# Patient Record
Sex: Male | Born: 1975 | Race: White | Hispanic: No | Marital: Married | Smoking: Former smoker
Health system: Southern US, Community
[De-identification: ages and names within clinical notes are randomized; demographics above are authoritative.]

## PROBLEM LIST (undated history)

## (undated) DIAGNOSIS — M5136 Other intervertebral disc degeneration, lumbar region: Secondary | ICD-10-CM

## (undated) DIAGNOSIS — M51369 Other intervertebral disc degeneration, lumbar region without mention of lumbar back pain or lower extremity pain: Secondary | ICD-10-CM

## (undated) DIAGNOSIS — M810 Age-related osteoporosis without current pathological fracture: Secondary | ICD-10-CM

## (undated) DIAGNOSIS — I10 Essential (primary) hypertension: Secondary | ICD-10-CM

## (undated) DIAGNOSIS — M1990 Unspecified osteoarthritis, unspecified site: Secondary | ICD-10-CM

## (undated) DIAGNOSIS — G894 Chronic pain syndrome: Secondary | ICD-10-CM

## (undated) DIAGNOSIS — G510 Bell's palsy: Secondary | ICD-10-CM

## (undated) HISTORY — DX: Essential (primary) hypertension: I10

## (undated) HISTORY — DX: Unspecified osteoarthritis, unspecified site: M19.90

## (undated) HISTORY — PX: SPINE SURGERY: SHX786

## (undated) HISTORY — DX: Age-related osteoporosis without current pathological fracture: M81.0

## (undated) HISTORY — DX: Chronic pain syndrome: G89.4

## (undated) HISTORY — PX: CHOLECYSTECTOMY: SHX55

## (undated) HISTORY — DX: Other intervertebral disc degeneration, lumbar region: M51.36

## (undated) HISTORY — DX: Other intervertebral disc degeneration, lumbar region without mention of lumbar back pain or lower extremity pain: M51.369

## (undated) HISTORY — DX: Morbid (severe) obesity due to excess calories: E66.01

## (undated) HISTORY — DX: Bell's palsy: G51.0

---

## 2004-01-24 ENCOUNTER — Emergency Department (HOSPITAL_COMMUNITY): Admission: EM | Admit: 2004-01-24 | Discharge: 2004-01-24 | Payer: Self-pay | Admitting: Emergency Medicine

## 2005-07-10 ENCOUNTER — Ambulatory Visit: Payer: Self-pay

## 2005-11-11 ENCOUNTER — Ambulatory Visit: Payer: Self-pay

## 2006-03-17 ENCOUNTER — Emergency Department: Payer: Self-pay | Admitting: General Practice

## 2007-02-25 ENCOUNTER — Emergency Department: Payer: Self-pay | Admitting: Emergency Medicine

## 2007-02-25 ENCOUNTER — Other Ambulatory Visit: Payer: Self-pay

## 2007-05-17 ENCOUNTER — Inpatient Hospital Stay: Payer: Self-pay | Admitting: General Surgery

## 2007-05-17 ENCOUNTER — Other Ambulatory Visit: Payer: Self-pay

## 2007-09-19 ENCOUNTER — Emergency Department: Payer: Self-pay | Admitting: Emergency Medicine

## 2008-04-30 ENCOUNTER — Emergency Department: Payer: Self-pay | Admitting: Emergency Medicine

## 2008-08-12 ENCOUNTER — Other Ambulatory Visit: Payer: Self-pay

## 2008-08-12 ENCOUNTER — Emergency Department: Payer: Self-pay | Admitting: Emergency Medicine

## 2008-08-17 ENCOUNTER — Other Ambulatory Visit: Payer: Self-pay

## 2008-08-18 ENCOUNTER — Inpatient Hospital Stay: Payer: Self-pay | Admitting: Internal Medicine

## 2008-08-18 ENCOUNTER — Encounter: Payer: Self-pay | Admitting: Gastroenterology

## 2008-08-22 ENCOUNTER — Encounter: Payer: Self-pay | Admitting: Gastroenterology

## 2008-09-03 ENCOUNTER — Encounter: Payer: Self-pay | Admitting: Gastroenterology

## 2008-09-05 ENCOUNTER — Ambulatory Visit: Payer: Self-pay | Admitting: Unknown Physician Specialty

## 2008-09-05 ENCOUNTER — Encounter: Payer: Self-pay | Admitting: Gastroenterology

## 2008-09-07 ENCOUNTER — Encounter: Payer: Self-pay | Admitting: Gastroenterology

## 2008-09-25 ENCOUNTER — Encounter: Payer: Self-pay | Admitting: Gastroenterology

## 2008-09-26 ENCOUNTER — Telehealth (INDEPENDENT_AMBULATORY_CARE_PROVIDER_SITE_OTHER): Payer: Self-pay | Admitting: *Deleted

## 2008-10-16 ENCOUNTER — Telehealth: Payer: Self-pay | Admitting: Gastroenterology

## 2009-02-18 ENCOUNTER — Emergency Department: Payer: Self-pay | Admitting: Emergency Medicine

## 2009-05-14 ENCOUNTER — Emergency Department: Payer: Self-pay | Admitting: Emergency Medicine

## 2009-07-13 ENCOUNTER — Emergency Department: Payer: Self-pay | Admitting: Emergency Medicine

## 2009-10-16 ENCOUNTER — Emergency Department: Payer: Self-pay | Admitting: Emergency Medicine

## 2010-04-19 ENCOUNTER — Emergency Department: Payer: Self-pay | Admitting: Emergency Medicine

## 2010-07-22 ENCOUNTER — Ambulatory Visit: Payer: Self-pay

## 2010-12-14 HISTORY — PX: BACK SURGERY: SHX140

## 2011-01-26 ENCOUNTER — Emergency Department: Payer: Self-pay | Admitting: Emergency Medicine

## 2011-03-05 ENCOUNTER — Ambulatory Visit: Payer: Self-pay | Admitting: Specialist

## 2011-03-11 ENCOUNTER — Emergency Department: Payer: Self-pay | Admitting: Emergency Medicine

## 2011-04-08 ENCOUNTER — Encounter (HOSPITAL_COMMUNITY)
Admission: RE | Admit: 2011-04-08 | Discharge: 2011-04-08 | Disposition: A | Discharge status: Home / Self Care | Payer: 59 | Source: Ambulatory Visit | Attending: Neurosurgery | Admitting: Neurosurgery

## 2011-04-08 LAB — CBC
HCT: 39 % (ref 39.0–52.0)
MCH: 31.3 pg (ref 26.0–34.0)
MCV: 85.9 fL (ref 78.0–100.0)
RBC: 4.54 MIL/uL (ref 4.22–5.81)
WBC: 4.7 10*3/uL (ref 4.0–10.5)

## 2011-04-08 LAB — BASIC METABOLIC PANEL
Chloride: 104 mEq/L (ref 96–112)
GFR calc Af Amer: >60 mL/min (ref >60)
Potassium: 4.3 mEq/L (ref 3.5–5.1)

## 2011-04-08 LAB — TYPE AND SCREEN

## 2011-04-08 LAB — DIFFERENTIAL
Eosinophils Relative: 5 % (ref 0–5)
Lymphocytes Relative: 39 % (ref 12–46)
Lymphs Abs: 1.9 10*3/uL (ref 0.7–4.0)
Monocytes Relative: 9 % (ref 3–12)
Neutrophils Relative %: 46 % (ref 43–77)

## 2011-04-08 LAB — SURGICAL PCR SCREEN
MRSA, PCR: NEGATIVE
Staphylococcus aureus: POSITIVE — AB

## 2011-04-08 LAB — ABO/RH: ABO/RH(D): B POS

## 2011-04-13 ENCOUNTER — Inpatient Hospital Stay (HOSPITAL_COMMUNITY): Payer: 59

## 2011-04-13 ENCOUNTER — Ambulatory Visit (HOSPITAL_COMMUNITY)
Admission: RE | Admit: 2011-04-13 | Discharge: 2011-04-14 | Disposition: A | Discharge status: Home / Self Care | Payer: 59 | Source: Ambulatory Visit | Attending: Neurosurgery | Admitting: Neurosurgery

## 2011-04-13 DIAGNOSIS — Z01812 Encounter for preprocedural laboratory examination: Secondary | ICD-10-CM | POA: Insufficient documentation

## 2011-04-13 DIAGNOSIS — M5126 Other intervertebral disc displacement, lumbar region: Secondary | ICD-10-CM | POA: Insufficient documentation

## 2011-04-30 NOTE — Op Note (Signed)
NAMECHASETON, Larry Goodwin             ACCOUNT NO.:  [id number]  MEDICAL RECORD NO.:  [id number]           PATIENT TYPE:  I  LOCATION:  3523                         FACILITY:  [facility]  PHYSICIAN:  Mehtab Dolberry A. Bella Brummet, M.D.    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  PROCEDURE NAME:  Right L4-5 laminotomy and microdiskectomy.  SURGEON:  Kathaleen Maser. Ambrosia Wisnewski, MD.  ASSISTANT:  Donalee Citrin, MD.  ANESTHESIA:  General endotracheal.  INDICATION:  Ms. Larry Goodwin is a 35 year old male with history of back and right lower extremity pain, paresthesias, and weakness of the right- sided L5 radiculopathy, failing conservative management.  Workup demonstrates evidence of broad-based rightward L4-5 disk herniation with compression of thecal sac and right-sided L5 nerve root.  The patient has failed conservative management, who presents now for microdiskectomy in hopes of improving his symptoms.  OPERATIVE NOTE:  The patient was taken to the operating room table and placed in the supine position.  Adequate level of anesthesia was achieved.  The patient was prone onto Wilson frame, appropriately padded the patient's lumbar region and prepped and draped sterilely.  A #10 blade was used to make a curvilinear skin incision overlying the L4-L5 interspace.  This was carried down sharply in the midline.  A subperiosteal dissection was then performed exposing the lamina and facet joints of L4 and L5 on the right side.  Deep self-retaining retractor was placed.  Intraoperative x-ray was taken and the level was confirmed.  The laminotomy was then performed using high-speed drill and Kerrison rongeurs to remove the inferior aspect of lamina of L4, medial aspect of the L4-5  facet joint, superior rim of the L5-S1.  Ligamentum flavum was then elevated and resected in the piecemeal fashion using Kerrison rongeurs.  The underlying thecal sac and exiting S1 nerve root were identified.  Microscope was then brought into the field using microdissection over the right side L5 nerve root underlying disk herniation.  Epidural venous plexus coagulated and cut.  Thecal sac and L5 nerve root was gently mobilized towards the midline. They were retracted using a D'Errico nerve root retractor.  Disk herniation was readily apparent.  This was then incised with 15 blade in a rectangular fashion to widen the disk space.  Clean-out was achieved using pituitary rongeurs and upbiting pituitary rongeurs and Epstein curettes.  All elements of disk herniation were completely resected.  All loose or obvious degenerative disk material was then removed from the interspace. At this point, a very thorough diskectomy had been achieved.  There was no injury to thecal sac or nerve root.  There was no residual compression upon the thecal sac or nerve roots.  The wound was then irrigated with antibiotic solution.  Gelfoam was placed topically for hemostasis and found to be good.  Microscope and retractor system were removed.  Hemostasis was then achieved with electrocautery. The wound was closed in layers with Vicryl suture.  Steri-Strips and sterile dressings were applied.  No complications.  The patient is well, and he returns to recovery room postoperatively.          ______________________________ Kathaleen Maser Anishka Bushard, M.D.     HAP/MEDQ  D:  04/13/2011  T:  04/14/2011  Job:  [id number]  Electronically Signed by Julio Sicks M.D. on 04/30/2011 11:18:27 AM

## 2011-05-03 ENCOUNTER — Inpatient Hospital Stay (HOSPITAL_COMMUNITY)
Admission: EM | Admit: 2011-05-03 | Discharge: 2011-05-06 | DRG: 491 | Disposition: A | Discharge status: Home / Self Care | Payer: 59 | Attending: Neurosurgery | Admitting: Neurosurgery

## 2011-05-03 ENCOUNTER — Emergency Department (HOSPITAL_COMMUNITY): Payer: 59

## 2011-05-03 DIAGNOSIS — M5126 Other intervertebral disc displacement, lumbar region: Principal | ICD-10-CM | POA: Diagnosis present

## 2011-05-03 DIAGNOSIS — E669 Obesity, unspecified: Secondary | ICD-10-CM | POA: Diagnosis present

## 2011-05-03 DIAGNOSIS — Z9889 Other specified postprocedural states: Secondary | ICD-10-CM

## 2011-05-03 LAB — URINALYSIS, ROUTINE W REFLEX MICROSCOPIC
Ketones, ur: NEGATIVE mg/dL
Nitrite: NEGATIVE
Specific Gravity, Urine: 1.024 (ref 1.005–1.030)
pH: 7 (ref 5.0–8.0)

## 2011-05-03 LAB — DIFFERENTIAL
Basophils Absolute: 0 10*3/uL (ref 0.0–0.1)
Eosinophils Absolute: 0.3 10*3/uL (ref 0.0–0.7)
Monocytes Absolute: 0.6 10*3/uL (ref 0.1–1.0)
Neutrophils Relative %: 53 % (ref 43–77)

## 2011-05-03 LAB — CBC
MCH: 31.2 pg (ref 26.0–34.0)
Platelets: 219 10*3/uL (ref 150–400)
RBC: 4.29 MIL/uL (ref 4.22–5.81)
WBC: 5.7 10*3/uL (ref 4.0–10.5)

## 2011-05-03 LAB — BASIC METABOLIC PANEL
BUN: 10 mg/dL (ref 6–23)
GFR calc non Af Amer: >60 mL/min (ref >60)
Potassium: 5.1 mEq/L (ref 3.5–5.1)

## 2011-05-03 MED ORDER — GADOBENATE DIMEGLUMINE 529 MG/ML IV SOLN
20.0000 mL | Freq: Once | INTRAVENOUS | Status: AC | PRN
  Administered 2011-05-03: 20 mL via INTRAVENOUS

## 2011-05-04 ENCOUNTER — Inpatient Hospital Stay (HOSPITAL_COMMUNITY): Payer: 59

## 2011-05-04 LAB — MRSA PCR SCREENING: MRSA by PCR: NEGATIVE

## 2011-05-04 LAB — TYPE AND SCREEN

## 2011-05-06 NOTE — Consult Note (Signed)
  Larry Goodwin, Larry Goodwin             ACCOUNT NO.:  [id number]  MEDICAL RECORD NO.:  [id number]           PATIENT TYPE:  E  LOCATION:  [facility]                         FACILITY:  [facility]  PHYSICIAN:  Cristi Loron, M.D.DATE OF BIRTH:  06/02/1976  DATE OF CONSULTATION:  05/03/2011 DATE OF DISCHARGE:                                CONSULTATION   CHIEF COMPLAINT:  Right leg pain.  HISTORY OF PRESENT ILLNESS:  The patient is a 35 year old white male who underwent a right L4-5 diskectomy, by Dr. Julio Sicks on April 13, 2011. The patient has had some persistent back and right leg pain.  He last saw Dr. Jordan Likes in followup on May 01, 2011.  At that time, Dr. Jordan Likes had discussed physical therapy with him.  The patient has had persistent pain throughout the weekend and got the point where "I could not stand on."  He spoke with Dr. Wynetta Emery, who advised him go to the ER.  The patient was evaluated by Dr. Wayland Salinas and Trixie Dredge, [place].  They called me, I suggested they get a lumbar MRI which is demonstrated likely recurrent ruptured disk.  They asked me to see the patient.  Presently, patient is accompanied by his wife.  He complains of back pain, right leg pain.  He has not noticed any fevers, drainage from the wound.  He does not complain of headache.  The patient has been on Percocet p.r.n. pain with an adequate relief.  The patient's past medical history, past surgical, family history, and social history, etc, can be obtained from Dr. Lindalou Hose previous admission note April 13, 2011.  REVIEW OF SYSTEMS:  Negative except as above.  PHYSICAL EXAMINATION:  GENERAL:  A pleasant obese 35 year old white male who complains of right leg pain.  The patient's lumbar wound is healing well without signs of infection, discharge, etc. NEUROLOGIC:  The patient is alert and oriented. MUSCULOSKELETAL:  Motor strength is 5/5 in left quadriceps, gastrocnemius, dorsiflexes/EHL.  Has some give away weakness,  right quadriceps, dorsiflexion, EHL.  I reviewed patient's lumbar MRI performed without contrast May 03, 2011, at [facility] demonstrates, the patient has a postoperative fluid collection.  He also has findings consistent with a recurrent right-sided ruptured disk.  He has had a prior right L4 laminotomy and diskectomy in L4-5.  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 Jordan Likes, will discuss the situation further with him tomorrow and likely redo a cystectomy.  In the meantime, we will start him on some steroids and a morphine PCA for pain control.     Cristi Loron, M.D.     JDJ/MEDQ  D:  05/03/2011  T:  05/03/2011  Job:  [id number]  Electronically Signed by Tressie Stalker M.D. on 05/06/2011 02:31:32 PM

## 2011-05-14 NOTE — Op Note (Signed)
Larry Goodwin, Larry Goodwin             ACCOUNT NO.:  [id number]  MEDICAL RECORD NO.:  [id number]           PATIENT TYPE:  I  LOCATION:  3009                         FACILITY:  [facility]  PHYSICIAN:  Kathaleen Maser. Linh Johannes, M.D.    DATE OF BIRTH:  01/20/1976  DATE OF PROCEDURE:  05/04/2011 DATE OF DISCHARGE:                              OPERATIVE REPORT   PREOPERATIVE DIAGNOSIS:  Right L4-5 recurrent herniated nucleus pulposus with radiculopathy.  POSTOPERATIVE DIAGNOSIS:  Right L4-5 recurrent herniated nucleus pulposus with radiculopathy.  PROCEDURE:  Right L4-5 re-exploration of laminotomy with right L4-5 redo microdiskectomy.  SURGEON:  Kathaleen Maser. Keeton Kassebaum, MD  ASSISTANT:  None.  ANESTHESIA:  General endotracheal.  INDICATIONS:  [name] is a 35 year old male approximately 3 weeks status post right-sided L4-5 laminotomy and microdiskectomy. Postoperatively, the patient has not thrive.  He has had persistent back and right lower extremity pain.  The patient recently had been readmitted secondary to pain control.  Workup included an MRI scan which demonstrated no evidence of infection or other serious complication. However, the patient did have some evidence of a small recurrent disk herniation with compression upon the thecal sac and right-sided L5 nerve root.  I discussed the situation with the patient.  Given his poor functionality, I thought it was reasonable to re-explore his laminotomy in hopes of improving his situation.  OPERATIVE NOTE:  The patient was taken to the operating room and placed on op table in supine position.  After adequate level of anesthesia was achieved, the patient was placed prone onto Wilson frame and appropriately padded.  The patient's lumbar region was prepped and draped sterilely.  A #10-blade was used to make a curvilinear skin incision overlying the L4-5 interspace.  This was carried down sharply in the midline.  A subperiosteal dissection was performed  exposing the lamina facet joint L4-L5 on the right side.  Deep self-retaining retractors were placed.  Intraoperative x-ray was not needed secondary to being able to visualize the previous disk space and the annulotomy. Microscope was brought in for microdissection of the right side L5 nerve root and underlying disk space.  Thecal sac at L5 nerve root gently mobilized and tracked towards the midline.  There was no large disk herniation but there was some generalized bulging around the nerve root. Thecal sac and L5 nerve root were retracted.  Disk space was then aggressively emptied using pituitary rongeurs, up-angled pituitary rongeurs, and Epstein curettes.  All elements of any disk herniation were fully resected.  The spinal canal was further inspected.  There was no evidence of injury to thecal sac or nerve roots.  There was no evidence of any residual compression upon the thecal sac or nerve roots. Wound was then irrigated with antibiotic solution.  Gelfoam was placed topically for hemostasis which was found to be good.  Microscope andretractor system were removed.  Hemostasis of the muscles achieved with electrocautery.  Wound was then closed in layers with Vicryl sutures.  A medium Hemovac drain was left in the epidural space.  Wound was then closed in layers with Vicryl sutures.  Steri-Strips and sterile dressings were applied.  There were no complications.  The patient tolerated the procedure well and he returns to the recovery room postoperatively.          ______________________________ Kathaleen Maser Aquilla Shambley, M.D.     HAP/MEDQ  D:  05/04/2011  T:  05/05/2011  Job:  [id number]  Electronically Signed by Julio Sicks M.D. on 05/14/2011 01:13:24 PM

## 2011-07-07 ENCOUNTER — Emergency Department: Payer: Self-pay | Admitting: Emergency Medicine

## 2011-07-08 ENCOUNTER — Emergency Department: Payer: Self-pay | Admitting: *Deleted

## 2011-07-10 ENCOUNTER — Emergency Department: Payer: Self-pay | Admitting: Internal Medicine

## 2011-07-10 ENCOUNTER — Emergency Department (HOSPITAL_COMMUNITY)
Admission: EM | Admit: 2011-07-10 | Discharge: 2011-07-10 | Disposition: A | Discharge status: Home / Self Care | Payer: 59 | Attending: Emergency Medicine | Admitting: Emergency Medicine

## 2011-07-10 ENCOUNTER — Emergency Department (HOSPITAL_COMMUNITY): Payer: 59

## 2011-07-10 DIAGNOSIS — R42 Dizziness and giddiness: Secondary | ICD-10-CM | POA: Insufficient documentation

## 2011-07-10 DIAGNOSIS — R079 Chest pain, unspecified: Secondary | ICD-10-CM | POA: Insufficient documentation

## 2011-07-10 DIAGNOSIS — R0602 Shortness of breath: Secondary | ICD-10-CM | POA: Insufficient documentation

## 2011-07-10 DIAGNOSIS — F411 Generalized anxiety disorder: Secondary | ICD-10-CM | POA: Insufficient documentation

## 2011-07-10 DIAGNOSIS — R61 Generalized hyperhidrosis: Secondary | ICD-10-CM | POA: Insufficient documentation

## 2011-07-10 DIAGNOSIS — R062 Wheezing: Secondary | ICD-10-CM | POA: Insufficient documentation

## 2011-07-10 DIAGNOSIS — R0609 Other forms of dyspnea: Secondary | ICD-10-CM | POA: Insufficient documentation

## 2011-07-10 DIAGNOSIS — M546 Pain in thoracic spine: Secondary | ICD-10-CM | POA: Insufficient documentation

## 2011-07-10 DIAGNOSIS — R0989 Other specified symptoms and signs involving the circulatory and respiratory systems: Secondary | ICD-10-CM | POA: Insufficient documentation

## 2011-07-10 DIAGNOSIS — Z79899 Other long term (current) drug therapy: Secondary | ICD-10-CM | POA: Insufficient documentation

## 2011-07-10 LAB — CK TOTAL AND CKMB (NOT AT ARMC)
CK, MB: 1.4 ng/mL (ref 0.3–4.0)
Relative Index: INVALID (ref 0.0–2.5)
Total CK: 46 U/L (ref 7–232)

## 2011-07-10 LAB — COMPREHENSIVE METABOLIC PANEL
ALT: 37 U/L (ref 0–53)
Albumin: 4.8 g/dL (ref 3.5–5.2)
Alkaline Phosphatase: 62 U/L (ref 39–117)
Calcium: 9.5 mg/dL (ref 8.4–10.5)
GFR calc Af Amer: >60 mL/min (ref >60)
Glucose, Bld: 178 mg/dL — ABNORMAL HIGH (ref 70–99)
Potassium: 4 mEq/L (ref 3.5–5.1)
Sodium: 137 mEq/L (ref 135–145)
Total Protein: 7.7 g/dL (ref 6.0–8.3)

## 2011-07-10 LAB — DIFFERENTIAL
Lymphocytes Relative: 16 % (ref 12–46)
Lymphs Abs: 1.6 10*3/uL (ref 0.7–4.0)
Monocytes Absolute: 0.6 10*3/uL (ref 0.1–1.0)
Monocytes Relative: 6 % (ref 3–12)
Neutro Abs: 7.6 10*3/uL (ref 1.7–7.7)

## 2011-07-10 LAB — CBC
HCT: 39.4 % (ref 39.0–52.0)
Hemoglobin: 14.6 g/dL (ref 13.0–17.0)
RBC: 4.74 MIL/uL (ref 4.22–5.81)
WBC: 9.8 10*3/uL (ref 4.0–10.5)

## 2011-07-10 LAB — TROPONIN I: Troponin I: <0.3 ng/mL (ref <0.30)

## 2011-07-10 MED ORDER — IOHEXOL 300 MG/ML  SOLN
100.0000 mL | Freq: Once | INTRAMUSCULAR | Status: AC | PRN
  Administered 2011-07-10: 100 mL via INTRAVENOUS

## 2011-07-14 NOTE — Discharge Summary (Signed)
  NAMEGRAYDEN, BURLEY             ACCOUNT NO.:  [id number]  MEDICAL RECORD NO.:  [id number]  LOCATION:  3009                         FACILITY:  [facility]  PHYSICIAN:  Kathaleen Maser. Sederick Jacobsen, M.D.    DATE OF BIRTH:  [birth year]  DATE OF ADMISSION:  05/03/2011 DATE OF DISCHARGE:  05/06/2011                              DISCHARGE SUMMARY   FINAL DIAGNOSIS:  Recurrent L4-5 right-sided herniated nucleus pulposus.  HISTORY OF PRESENT ILLNESS:  Mr. Skoda is a 35 year old male, who is 3 weeks status post L4-5 laminectomy and microdiskectomy.  The patient presents with intractable back and lower extremity pain.  Workup demonstrates evidence of a small recurrent disk herniation.  The patient presents now for redo microdiskectomy.  HOSPITAL COURSE:  The patient was admitted to the hospital for pain control.  He subsequently underwent a right-sided L4-5 redo microdiskectomy.  Postoperatively, his pain level improved somewhat.  He was able to be mobilized.  At the time of discharge, the patient was ambulatory with some persistent pain, but overall improved function.  DISCHARGE DISPOSITION:  The patient may be discharged home.  He will follow up in my office in 1 week.          ______________________________ Kathaleen Maser. Deztiny Sarra, M.D.     HAP/MEDQ  D:  07/03/2011  T:  07/03/2011  Job:  [id number]  Electronically Signed by Julio Sicks M.D. on 07/14/2011 09:42:02 AM

## 2011-07-29 ENCOUNTER — Ambulatory Visit: Payer: Self-pay

## 2011-07-30 ENCOUNTER — Emergency Department: Payer: Self-pay | Admitting: Emergency Medicine

## 2011-08-12 ENCOUNTER — Ambulatory Visit: Payer: 59 | Admitting: Family Medicine

## 2011-08-29 ENCOUNTER — Emergency Department: Payer: Self-pay | Admitting: Emergency Medicine

## 2011-09-07 ENCOUNTER — Ambulatory Visit: Payer: Self-pay | Admitting: Neurosurgery

## 2011-09-08 ENCOUNTER — Ambulatory Visit: Payer: Self-pay | Admitting: Neurosurgery

## 2011-09-18 ENCOUNTER — Other Ambulatory Visit: Payer: Self-pay | Admitting: Neurosurgery

## 2011-09-18 DIAGNOSIS — M549 Dorsalgia, unspecified: Secondary | ICD-10-CM

## 2011-09-22 ENCOUNTER — Other Ambulatory Visit: Payer: 59

## 2011-09-28 ENCOUNTER — Ambulatory Visit
Admission: RE | Admit: 2011-09-28 | Discharge: 2011-09-28 | Disposition: A | Discharge status: Home / Self Care | Payer: No Typology Code available for payment source | Source: Ambulatory Visit | Attending: Neurosurgery | Admitting: Neurosurgery

## 2011-09-28 ENCOUNTER — Ambulatory Visit
Admission: RE | Admit: 2011-09-28 | Discharge: 2011-09-28 | Disposition: A | Discharge status: Home / Self Care | Payer: 59 | Source: Ambulatory Visit | Attending: Neurosurgery | Admitting: Neurosurgery

## 2011-09-28 DIAGNOSIS — M549 Dorsalgia, unspecified: Secondary | ICD-10-CM

## 2011-09-28 MED ORDER — MEPERIDINE HCL 100 MG/ML IJ SOLN
75.0000 mg | Freq: Once | INTRAMUSCULAR | Status: AC
  Administered 2011-09-28: 75 mg via INTRAMUSCULAR

## 2011-09-28 MED ORDER — IOHEXOL 300 MG/ML  SOLN
9.0000 mL | Freq: Once | INTRAMUSCULAR | Status: AC | PRN
  Administered 2011-09-28: 9 mL via INTRATHECAL

## 2011-09-28 MED ORDER — ONDANSETRON HCL 4 MG/2ML IJ SOLN
4.0000 mg | Freq: Once | INTRAMUSCULAR | Status: AC
  Administered 2011-09-28: 4 mg via INTRAMUSCULAR

## 2011-09-28 MED ORDER — DIAZEPAM 5 MG PO TABS
10.0000 mg | ORAL_TABLET | Freq: Once | ORAL | Status: AC
  Administered 2011-09-28: 10 mg via ORAL

## 2011-09-30 ENCOUNTER — Other Ambulatory Visit: Payer: 59

## 2011-09-30 ENCOUNTER — Emergency Department (HOSPITAL_COMMUNITY): Payer: Medicaid Other

## 2011-09-30 ENCOUNTER — Emergency Department (HOSPITAL_COMMUNITY)
Admission: EM | Admit: 2011-09-30 | Discharge: 2011-10-01 | Disposition: A | Discharge status: Home / Self Care | Payer: Medicaid Other | Attending: Emergency Medicine | Admitting: Emergency Medicine

## 2011-09-30 DIAGNOSIS — M549 Dorsalgia, unspecified: Secondary | ICD-10-CM | POA: Insufficient documentation

## 2011-09-30 DIAGNOSIS — H8110 Benign paroxysmal vertigo, unspecified ear: Secondary | ICD-10-CM | POA: Insufficient documentation

## 2011-09-30 DIAGNOSIS — I10 Essential (primary) hypertension: Secondary | ICD-10-CM | POA: Insufficient documentation

## 2011-09-30 DIAGNOSIS — G8929 Other chronic pain: Secondary | ICD-10-CM | POA: Insufficient documentation

## 2011-09-30 DIAGNOSIS — Z79899 Other long term (current) drug therapy: Secondary | ICD-10-CM | POA: Insufficient documentation

## 2011-09-30 DIAGNOSIS — R51 Headache: Secondary | ICD-10-CM | POA: Insufficient documentation

## 2011-09-30 LAB — POCT I-STAT, CHEM 8
BUN: 9 mg/dL (ref 6–23)
Chloride: 99 mEq/L (ref 96–112)
Glucose, Bld: 90 mg/dL (ref 70–99)
HCT: 42 % (ref 39.0–52.0)
Potassium: 3.4 mEq/L — ABNORMAL LOW (ref 3.5–5.1)

## 2011-10-02 ENCOUNTER — Telehealth: Payer: Self-pay | Admitting: Diagnostic Radiology

## 2011-10-02 NOTE — Telephone Encounter (Signed)
Returned pt's call pt c/o being dizzy and pressure in his neck. Explained I wasn't sure what the dizziness was from, but the pressure could mean he needs more bedrest and more fluids. Also some of his meds could cause dizziness. He is going to f/u with his primary care.

## 2011-12-22 ENCOUNTER — Emergency Department: Payer: Self-pay | Admitting: Emergency Medicine

## 2011-12-22 LAB — BASIC METABOLIC PANEL
Anion Gap: 9 (ref 7–16)
Calcium, Total: 8.7 mg/dL (ref 8.5–10.1)
Chloride: 98 mmol/L (ref 98–107)
Creatinine: 0.98 mg/dL (ref 0.60–1.30)
EGFR (African American): >60
EGFR (Non-African Amer.): >60
Glucose: 110 mg/dL — ABNORMAL HIGH (ref 65–99)
Sodium: 137 mmol/L (ref 136–145)

## 2011-12-22 LAB — CBC
HCT: 42.5 % (ref 40.0–52.0)
HGB: 14.8 g/dL (ref 13.0–18.0)
MCV: 88 fL (ref 80–100)
Platelet: 234 10*3/uL (ref 150–440)
RBC: 4.82 10*6/uL (ref 4.40–5.90)
WBC: 7.4 10*3/uL (ref 3.8–10.6)

## 2011-12-22 LAB — TROPONIN I: Troponin-I: <0.02 ng/mL

## 2011-12-22 LAB — LIPASE, BLOOD: Lipase: 129 U/L (ref 73–393)

## 2012-03-24 ENCOUNTER — Emergency Department: Payer: Self-pay | Admitting: Internal Medicine

## 2012-05-07 IMAGING — RF DG MYELOGRAM THORACIC
9 series · 9 of 9 positions shown · IV contrast (omnipaque)
Comparison: Outside MRI

CLINICAL DATA: Back pain.  Multilevel disc herniations.

 MYELOGRAM INJECTION
TECHNIQUE: Informed consent was obtained from the patient prior to
the procedure, including potential complications of headache,
allergy, infection and pain.  A timeout procedure was performed.
With the patient prone, the lower back was prepped with Betadine.
1% Lidocaine was used for local anesthesia.  Lumbar puncture was
performed at the right L2-3 level using a 5 inches 22 gauge needle
with return of clear CSF.  Nine ml of Omnipaque 600was injected
into the subarachnoid space .
TECHNIQUE: Following injection of intrathecal Omnipaque contrast,
spine imaging in multiple projections was performed using
fluoroscopy.
Fluoroscopy Time: 1 minute 21 seconds .
TECHNIQUE: CT imaging of the thoracic spine was performed after
intrathecal contrast administration.  Multiplanar CT image
reconstructions were also generated.

[Series 1: (hospital) · 1 of 1 slices shown (1 of 2)]
[im 1/1]
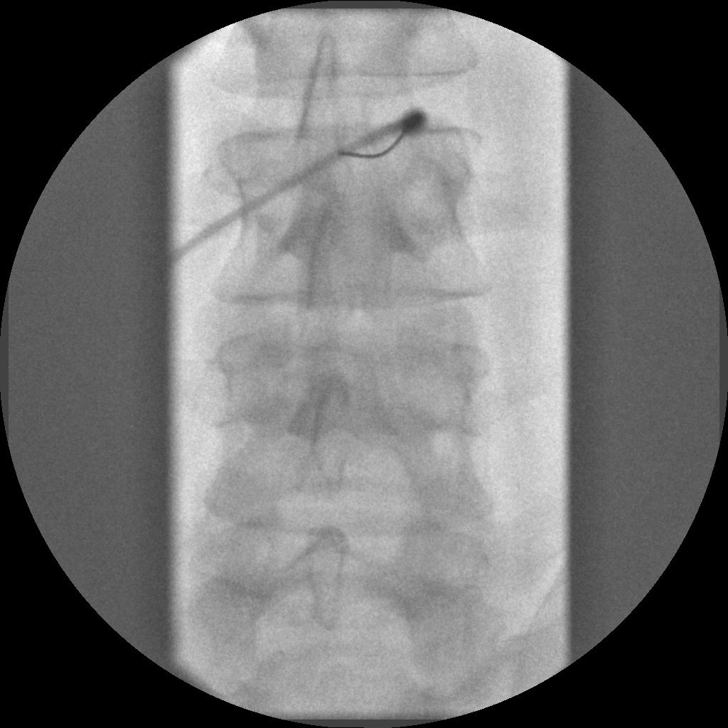

[Series 2: (hospital) · 1 of 1 slices shown (2 of 2)]
[im 1/1]
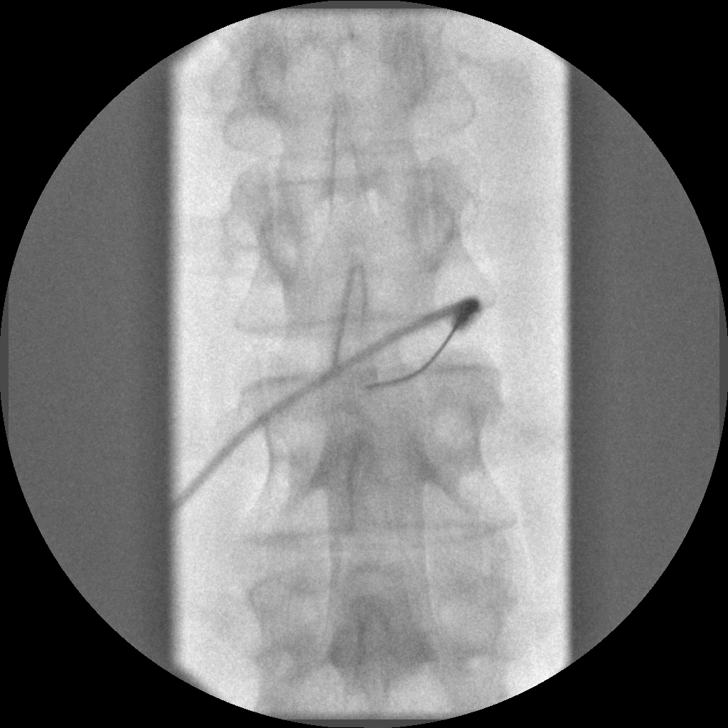

[Series 3: myelogram  white · 1 of 1 slices shown (1 of 7)]
[im 1/1]
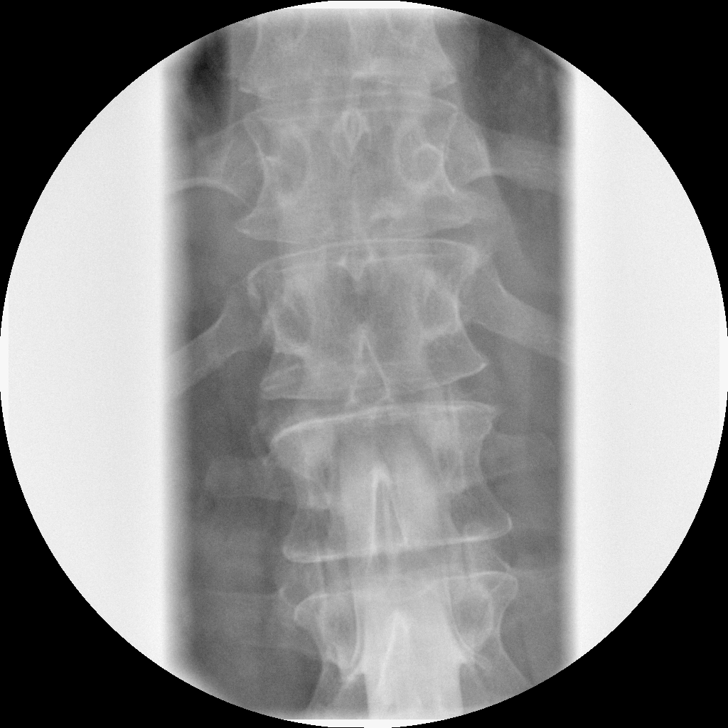

[Series 4: myelogram  white · 1 of 1 slices shown (2 of 7)]
[im 1/1]
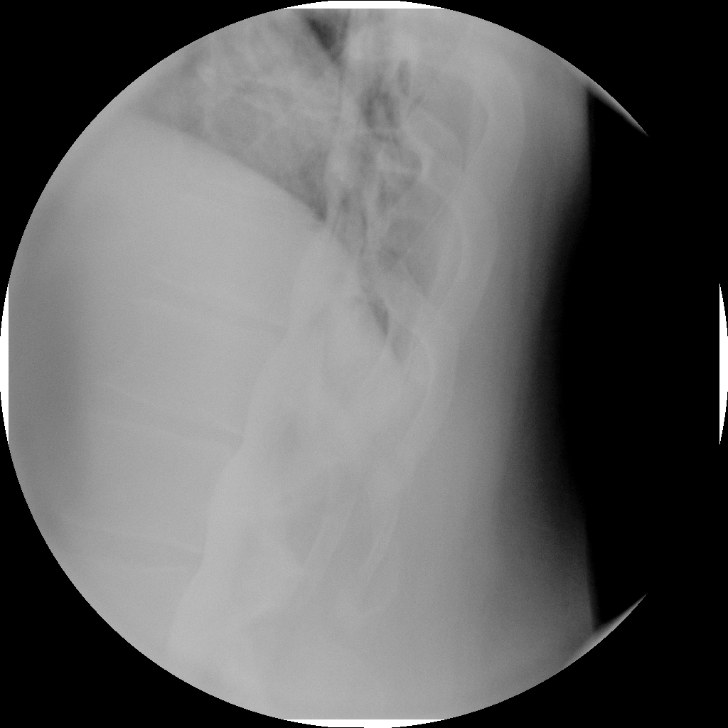

[Series 5: myelogram  white · 1 of 1 slices shown (3 of 7)]
[im 1/1]
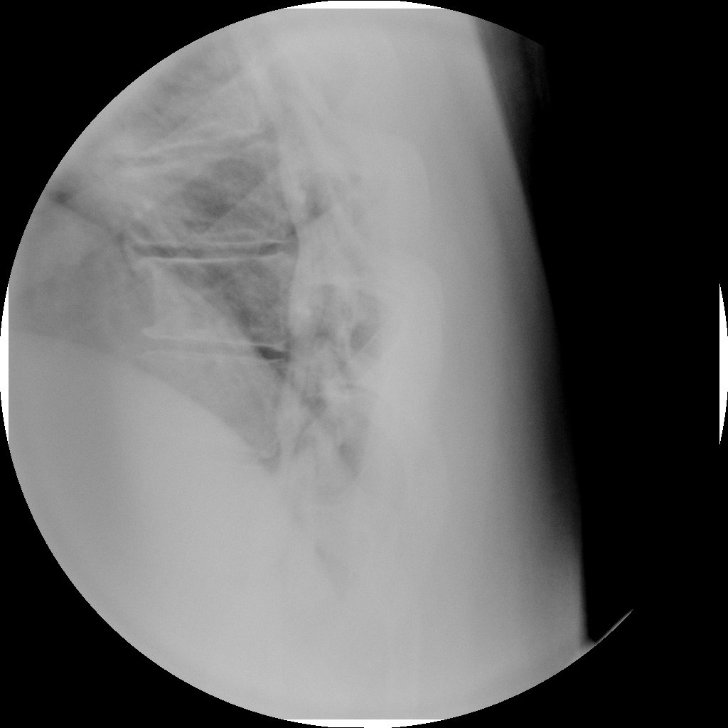

[Series 6: myelogram  white · 1 of 1 slices shown (4 of 7)]
[im 1/1]
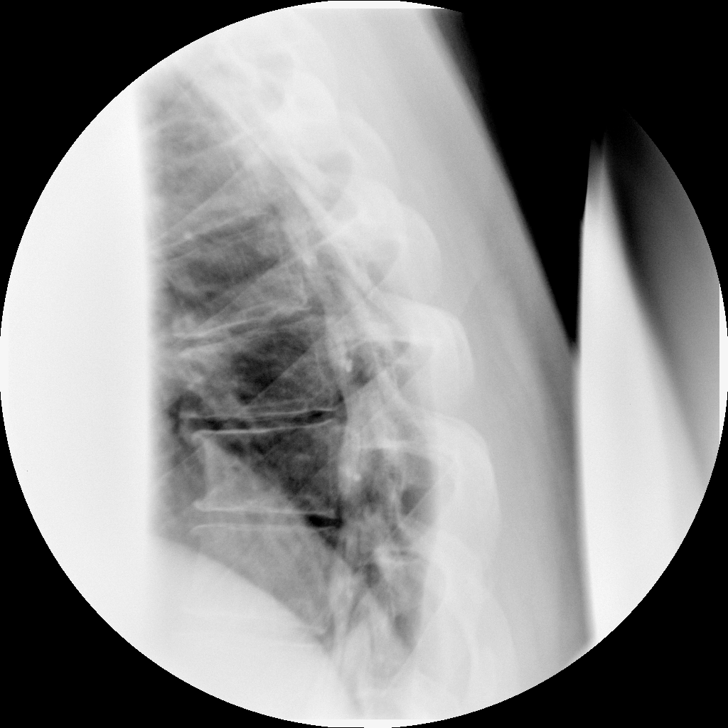

[Series 7: myelogram  white · 1 of 1 slices shown (5 of 7)]
[im 1/1]
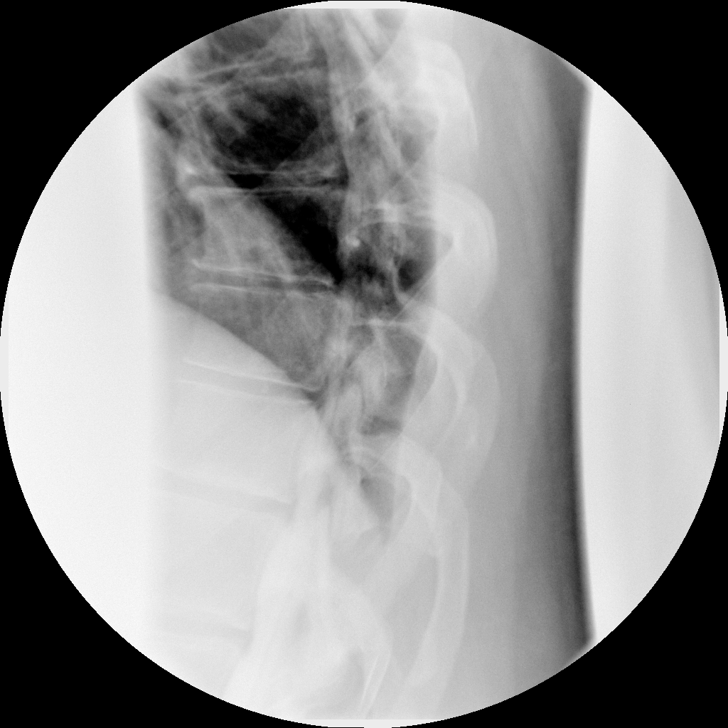

[Series 8: myelogram  white · 1 of 1 slices shown (6 of 7)]
[im 1/1]
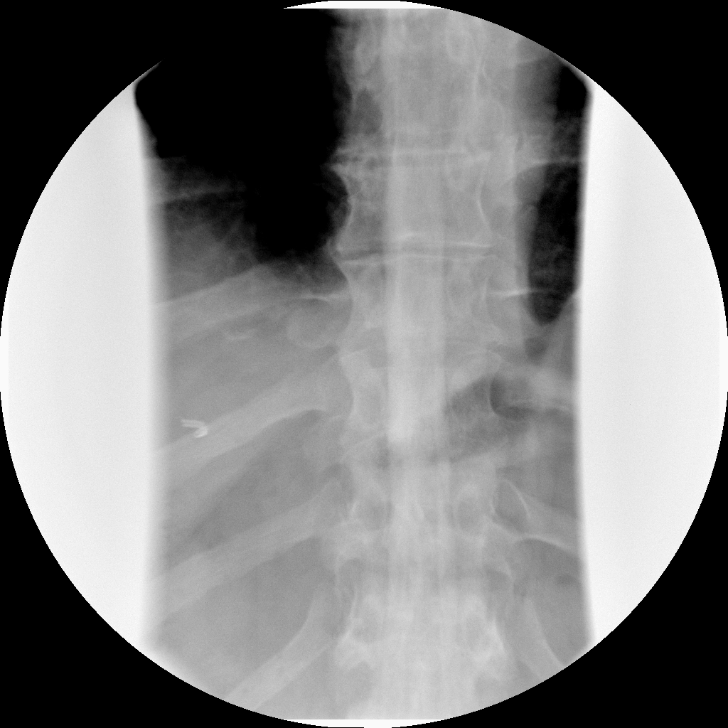

[Series 9: myelogram  white · 1 of 1 slices shown (7 of 7)]
[im 1/1]
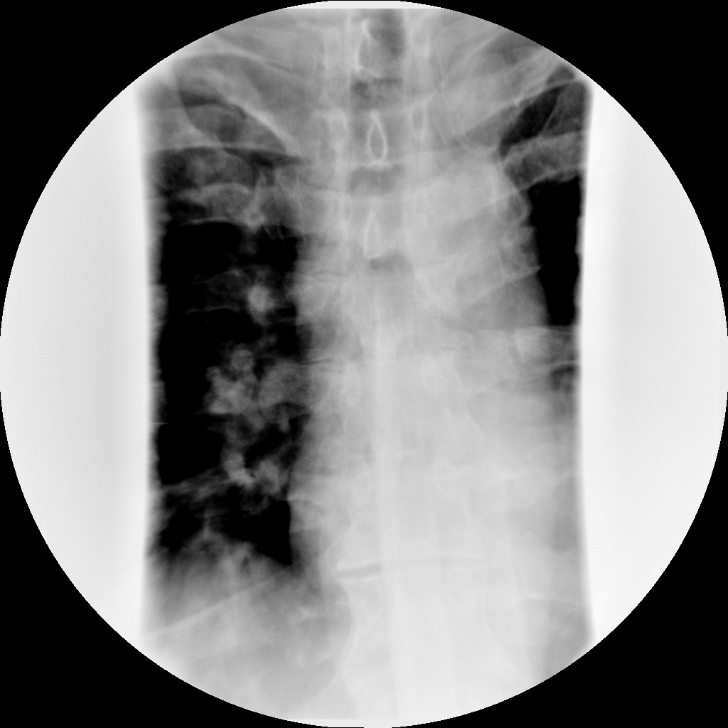

[9 of 9 positions shown; findings below may reference images not displayed]

IMPRESSION: Successful injection of  intrathecal contrast for myelography.

MYELOGRAM THORACIC
FINDINGS: There is no severe central canal stenosis or block to
passage of contrast.  There are anterior extradural defects at T6-
7, T7-8, T8-9 and T9-10 that efface the ventral subarachnoid space.
IMPRESSION: Anterior extradural defects at T6-7, T7-8, T8-9 and T9-10.

CT MYELOGRAPHY THORACIC SPINE
FINDINGS: There is no significant finding at T5-6 or above.  The
discs are unremarkable.  There is mild facet degeneration and some
ligamentous prominence but no compressive narrowing of the canal or
foramina.

T6-7:  Central disc herniation effaces the ventral subarachnoid
space and indents the cord more towards the right.  Mild facet
degeneration and ligamentous prominence.

T7-8:  Right posterolateral disc herniation indents the thecal sac
in the right side of the cord.  Mild facet degeneration and
ligamentous prominence.

T8-9:  Central right posterolateral disc herniation indents the
thecal sac in the right side of the cord.  Facet degeneration and
ligamentous prominence.

T9-10:  Central disc herniation with slight caudal migration
indents the thecal sac and the ventral aspect of the cord on the
left.  Mild facet degeneration and ligamentous prominence.

T10-11:  Mild bulging of the disc.  Facet degeneration and
ligamentous prominence.

T11-12:  Mild bulging of the disc.  Facet degeneration and
ligamentous prominence.

T12-L1:  Unremarkable interspace.

No evidence of cord lesion.  1 can appreciate small hemangiomas
scattered within the spine, most notable on the left posterior
corner of the T7 vertebral body.

There are anterior bridging osteophytes at T7-8 and T8-9 which
would appear to result in functional anterior fusion.
IMPRESSION: T6-7:  Central disc herniation indents the right side of the cord.
Facet degeneration and ligamentous prominence/calcification.

T7-8:  Right posterolateral disc herniation indents the right side
of the cord.  Facet degeneration and ligamentous
prominence/calcification.

T8-9:  Central right posterolateral disc herniation indents the
right side of the cord.  Facet degeneration and ligamentous
prominence/calcification.

T9-10:  Central disc herniation with caudal migration indents the
ventral cord more towards the left.  Facet degeneration and
ligamentous prominence/calcification.

Other findings of note include anterior bridging osteophytes at T7-
8 and T8-9 that appear to result in anterior fusion. There are
lesser changes of facet degeneration and ligamentous
prominence/calcification at other levels throughout the thoracic
region.

## 2012-05-10 IMAGING — CT CT HEAD W/O CM
1 of 2 series · 13 of 30 positions shown, 17 images · non-contrast
Comparison: None

CLINICAL DATA: Dizziness, weakness, nausea.  Near syncope.

CT HEAD WITHOUT CONTRAST
TECHNIQUE: Contiguous axial images were obtained from the base of
the skull through the vertex without contrast.

[Series 2: brain · axial · 0.49mm/px · z∈[+155,+289]mm · 13 of 32 slices shown, 17 images]
[im 3/32  brain]
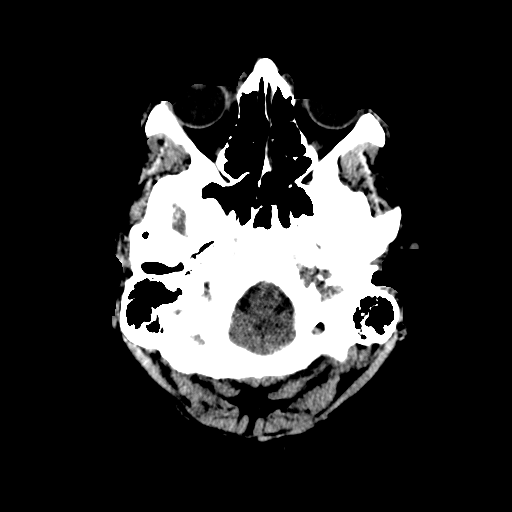
[im 3/32  bone]
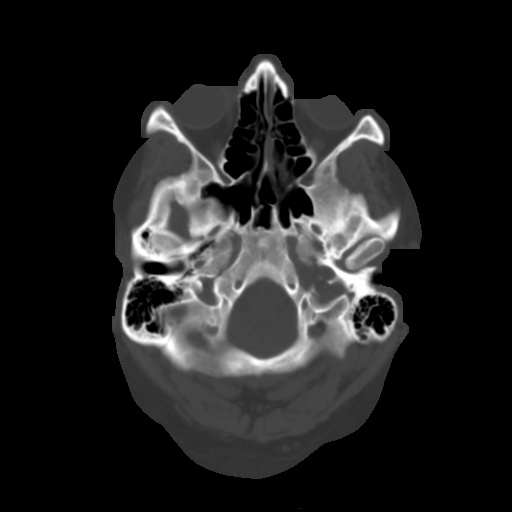
[im 5/32  brain]
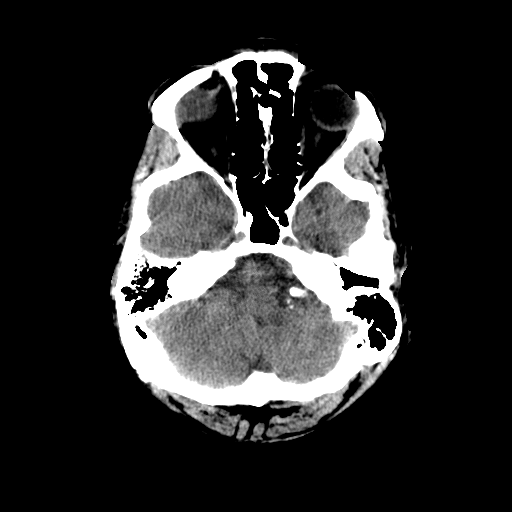
[im 7/32  brain]
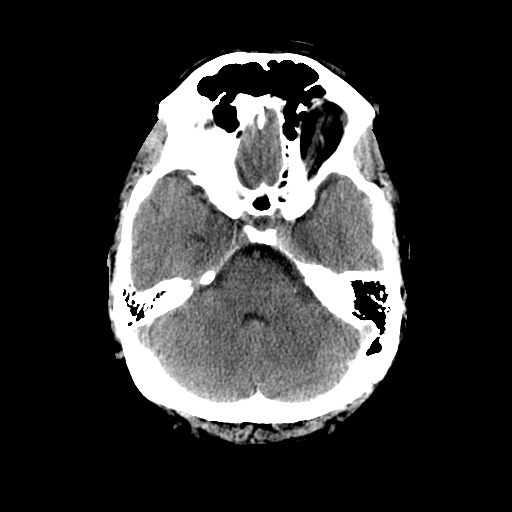
[im 9/32  brain]
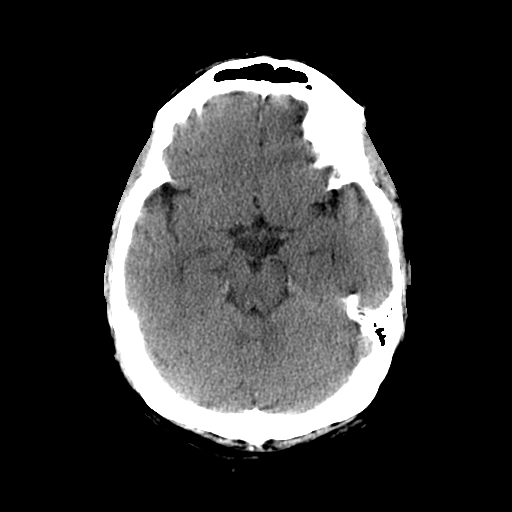
[im 12/32  brain]
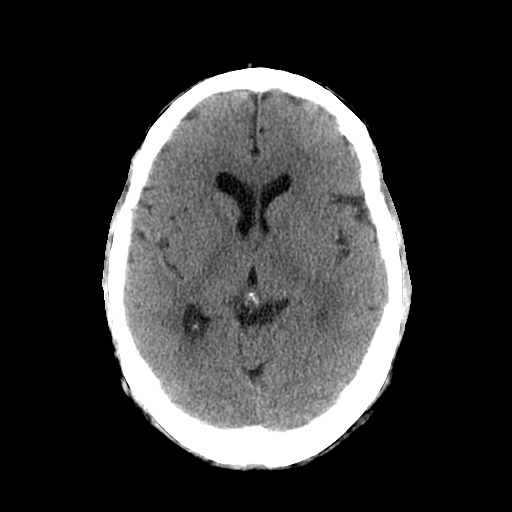
[im 12/32  bone]
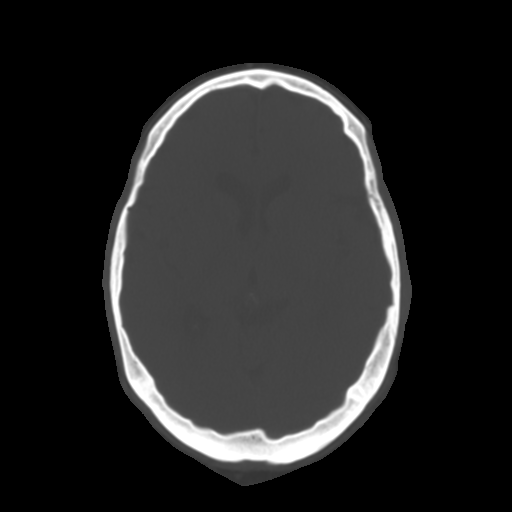
[im 14/32  brain]
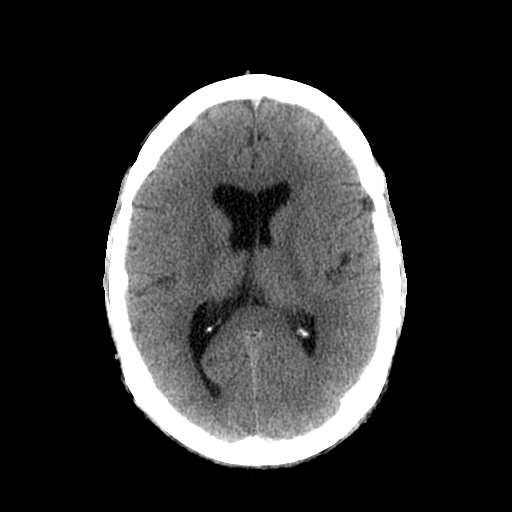
[im 16/32  brain]
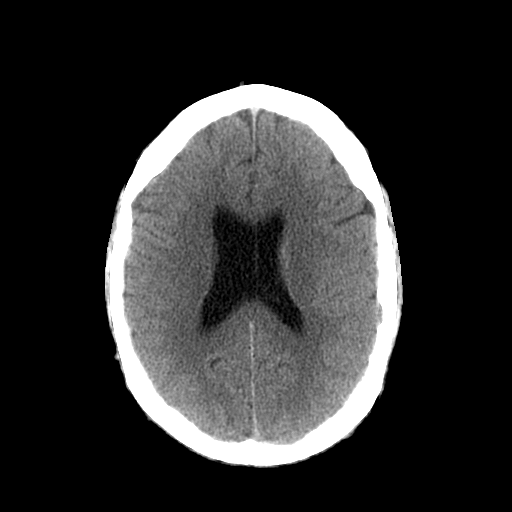
[im 18/32  brain]
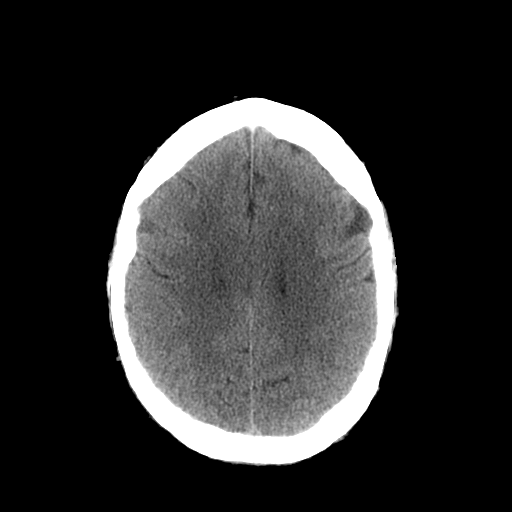
[im 20/32  brain]
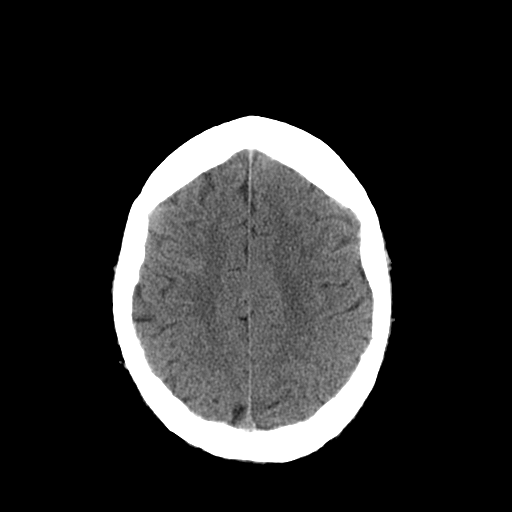
[im 20/32  bone]
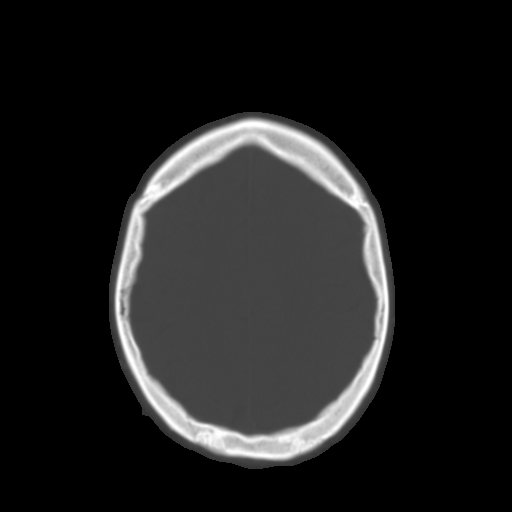
[im 23/32  brain]
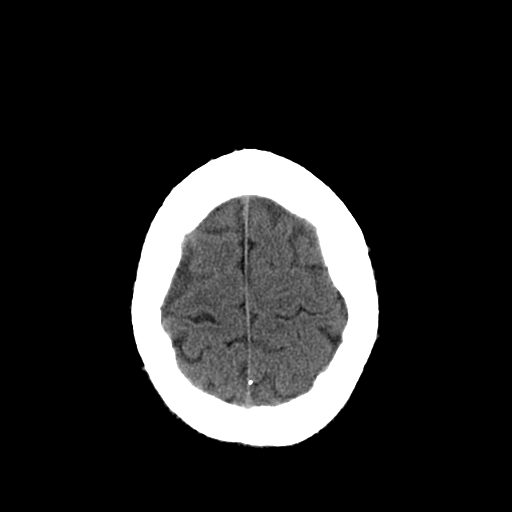
[im 25/32  brain]
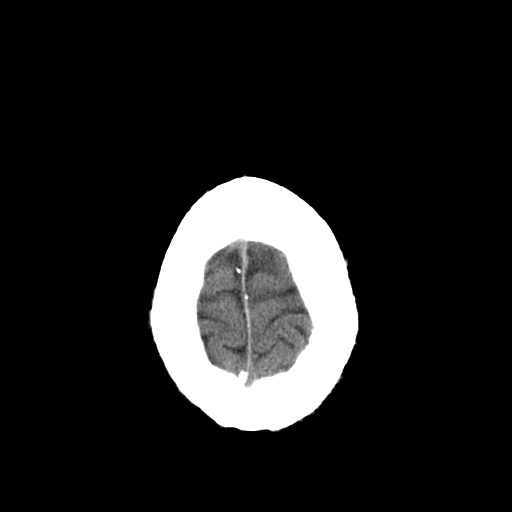
[im 27/32  brain]
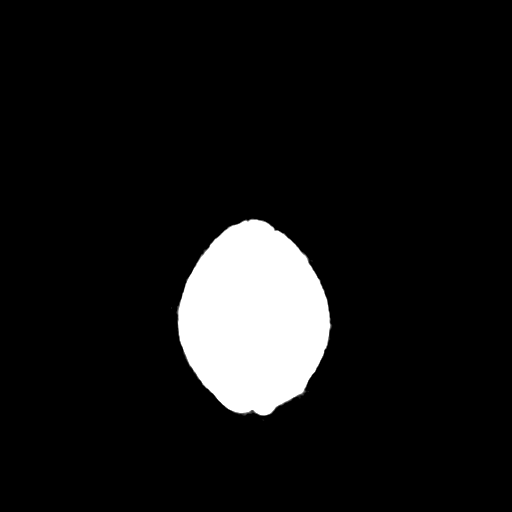
[im 29/32  brain]
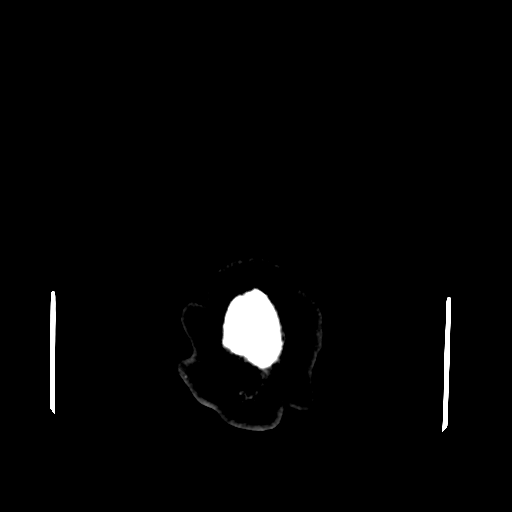
[im 29/32  bone]
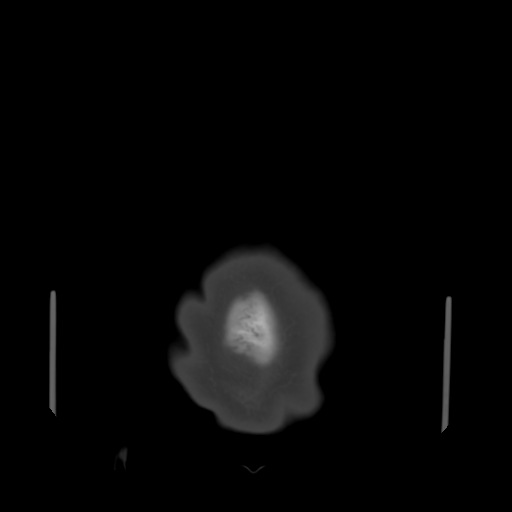

[13 of 30 positions shown; findings below may reference images not displayed]

FINDINGS: No acute intracranial abnormality.  Specifically, no
hemorrhage, hydrocephalus, mass lesion, acute infarction, or
significant intracranial injury.  No acute calvarial abnormality.
Visualized paranasal sinuses and mastoids clear.  Orbital soft
tissues unremarkable.
IMPRESSION: Normal study.

## 2012-09-21 ENCOUNTER — Emergency Department: Payer: Self-pay

## 2012-09-21 LAB — COMPREHENSIVE METABOLIC PANEL
Alkaline Phosphatase: 63 U/L (ref 50–136)
Bilirubin,Total: 0.8 mg/dL (ref 0.2–1.0)
Calcium, Total: 8.6 mg/dL (ref 8.5–10.1)
Chloride: 104 mmol/L (ref 98–107)
Co2: 30 mmol/L (ref 21–32)
Creatinine: 0.74 mg/dL (ref 0.60–1.30)
EGFR (African American): >60
EGFR (Non-African Amer.): >60
Osmolality: 282 (ref 275–301)
Potassium: 4.4 mmol/L (ref 3.5–5.1)
SGPT (ALT): 22 U/L (ref 12–78)
Sodium: 141 mmol/L (ref 136–145)

## 2012-09-21 LAB — URINALYSIS, COMPLETE
Blood: NEGATIVE
Glucose,UR: NEGATIVE mg/dL (ref 0–75)
Leukocyte Esterase: NEGATIVE
Nitrite: NEGATIVE
Ph: 8 (ref 4.5–8.0)
Protein: NEGATIVE
Specific Gravity: 1.006 (ref 1.003–1.030)

## 2012-09-21 LAB — CBC
HCT: 40.9 % (ref 40.0–52.0)
HGB: 14.6 g/dL (ref 13.0–18.0)
MCH: 31.7 pg (ref 26.0–34.0)
MCHC: 35.7 g/dL (ref 32.0–36.0)
MCV: 89 fL (ref 80–100)
RBC: 4.6 10*6/uL (ref 4.40–5.90)

## 2012-09-21 LAB — LIPASE, BLOOD: Lipase: 153 U/L (ref 73–393)

## 2012-09-22 ENCOUNTER — Other Ambulatory Visit: Payer: Self-pay | Admitting: Family Medicine

## 2012-09-22 ENCOUNTER — Ambulatory Visit
Admission: RE | Admit: 2012-09-22 | Discharge: 2012-09-22 | Disposition: A | Discharge status: Home / Self Care | Payer: Worker's Compensation | Source: Ambulatory Visit | Attending: Family Medicine | Admitting: Family Medicine

## 2012-09-22 DIAGNOSIS — M549 Dorsalgia, unspecified: Secondary | ICD-10-CM

## 2013-04-13 ENCOUNTER — Other Ambulatory Visit: Payer: Self-pay | Admitting: Neurosurgery

## 2013-04-13 DIAGNOSIS — IMO0002 Reserved for concepts with insufficient information to code with codable children: Secondary | ICD-10-CM

## 2013-04-21 ENCOUNTER — Other Ambulatory Visit: Payer: Self-pay

## 2013-04-21 ENCOUNTER — Other Ambulatory Visit: Payer: Self-pay | Admitting: Neurosurgery

## 2013-04-21 DIAGNOSIS — IMO0002 Reserved for concepts with insufficient information to code with codable children: Secondary | ICD-10-CM

## 2013-04-24 ENCOUNTER — Ambulatory Visit
Admission: RE | Admit: 2013-04-24 | Discharge: 2013-04-24 | Disposition: A | Discharge status: Home / Self Care | Payer: 59 | Source: Ambulatory Visit | Attending: Neurosurgery | Admitting: Neurosurgery

## 2013-04-24 DIAGNOSIS — IMO0002 Reserved for concepts with insufficient information to code with codable children: Secondary | ICD-10-CM

## 2013-04-24 MED ORDER — GADOBENATE DIMEGLUMINE 529 MG/ML IV SOLN
20.0000 mL | Freq: Once | INTRAVENOUS | Status: AC | PRN
  Administered 2013-04-24: 20 mL via INTRAVENOUS

## 2013-12-14 DIAGNOSIS — G510 Bell's palsy: Secondary | ICD-10-CM

## 2013-12-14 HISTORY — DX: Bell's palsy: G51.0

## 2014-04-20 ENCOUNTER — Emergency Department: Payer: Self-pay | Admitting: Emergency Medicine

## 2014-04-20 DIAGNOSIS — F411 Generalized anxiety disorder: Secondary | ICD-10-CM | POA: Diagnosis not present

## 2014-04-20 DIAGNOSIS — M545 Low back pain, unspecified: Secondary | ICD-10-CM | POA: Diagnosis not present

## 2014-04-20 DIAGNOSIS — M47817 Spondylosis without myelopathy or radiculopathy, lumbosacral region: Secondary | ICD-10-CM | POA: Diagnosis not present

## 2014-04-20 DIAGNOSIS — I10 Essential (primary) hypertension: Secondary | ICD-10-CM | POA: Diagnosis not present

## 2014-04-20 DIAGNOSIS — M5430 Sciatica, unspecified side: Secondary | ICD-10-CM | POA: Diagnosis not present

## 2014-04-20 DIAGNOSIS — R52 Pain, unspecified: Secondary | ICD-10-CM | POA: Diagnosis not present

## 2014-04-20 DIAGNOSIS — Z79899 Other long term (current) drug therapy: Secondary | ICD-10-CM | POA: Diagnosis not present

## 2014-04-20 DIAGNOSIS — M5137 Other intervertebral disc degeneration, lumbosacral region: Secondary | ICD-10-CM | POA: Diagnosis not present

## 2014-04-20 DIAGNOSIS — G8929 Other chronic pain: Secondary | ICD-10-CM | POA: Diagnosis not present

## 2014-04-25 DIAGNOSIS — M955 Acquired deformity of pelvis: Secondary | ICD-10-CM | POA: Diagnosis not present

## 2014-04-25 DIAGNOSIS — M999 Biomechanical lesion, unspecified: Secondary | ICD-10-CM | POA: Diagnosis not present

## 2014-04-25 DIAGNOSIS — M5430 Sciatica, unspecified side: Secondary | ICD-10-CM | POA: Diagnosis not present

## 2014-05-02 DIAGNOSIS — M955 Acquired deformity of pelvis: Secondary | ICD-10-CM | POA: Diagnosis not present

## 2014-05-02 DIAGNOSIS — M999 Biomechanical lesion, unspecified: Secondary | ICD-10-CM | POA: Diagnosis not present

## 2014-05-02 DIAGNOSIS — M5430 Sciatica, unspecified side: Secondary | ICD-10-CM | POA: Diagnosis not present

## 2014-05-03 DIAGNOSIS — M5430 Sciatica, unspecified side: Secondary | ICD-10-CM | POA: Diagnosis not present

## 2014-05-03 DIAGNOSIS — M999 Biomechanical lesion, unspecified: Secondary | ICD-10-CM | POA: Diagnosis not present

## 2014-05-03 DIAGNOSIS — M955 Acquired deformity of pelvis: Secondary | ICD-10-CM | POA: Diagnosis not present

## 2014-05-09 DIAGNOSIS — M999 Biomechanical lesion, unspecified: Secondary | ICD-10-CM | POA: Diagnosis not present

## 2014-05-09 DIAGNOSIS — M5430 Sciatica, unspecified side: Secondary | ICD-10-CM | POA: Diagnosis not present

## 2014-05-09 DIAGNOSIS — M955 Acquired deformity of pelvis: Secondary | ICD-10-CM | POA: Diagnosis not present

## 2014-05-10 DIAGNOSIS — M5430 Sciatica, unspecified side: Secondary | ICD-10-CM | POA: Diagnosis not present

## 2014-05-10 DIAGNOSIS — M999 Biomechanical lesion, unspecified: Secondary | ICD-10-CM | POA: Diagnosis not present

## 2014-05-10 DIAGNOSIS — M955 Acquired deformity of pelvis: Secondary | ICD-10-CM | POA: Diagnosis not present

## 2014-05-29 ENCOUNTER — Ambulatory Visit (INDEPENDENT_AMBULATORY_CARE_PROVIDER_SITE_OTHER): Payer: 59 | Admitting: Internal Medicine

## 2014-05-29 ENCOUNTER — Encounter (INDEPENDENT_AMBULATORY_CARE_PROVIDER_SITE_OTHER): Payer: Self-pay

## 2014-05-29 ENCOUNTER — Encounter: Payer: Self-pay | Admitting: Internal Medicine

## 2014-05-29 VITALS — BP 142/94 | HR 92 | Temp 98.3°F | Wt 327.8 lb

## 2014-05-29 DIAGNOSIS — J019 Acute sinusitis, unspecified: Secondary | ICD-10-CM | POA: Diagnosis not present

## 2014-05-29 MED ORDER — AMOXICILLIN-POT CLAVULANATE 875-125 MG PO TABS: 1.0000 | ORAL_TABLET | Freq: Two times a day (BID) | ORAL | Status: DC

## 2014-05-29 NOTE — Progress Notes (Signed)
Pre visit review using our clinic review tool, if applicable. No additional management support is needed unless otherwise documented below in the visit note. 

## 2014-05-29 NOTE — Patient Instructions (Addendum)

## 2014-05-29 NOTE — Progress Notes (Signed)
HPI  Pt presents to the clinic today with c/o headache, facial pain and pressure, dizziness, nasal congestion and sore throat. He reports this started about 1 week ago. He is blowing thick green, blood tinged mucous out of his nose. He has not taken anything OTC because he is not sure what he can take with his blood pressure medication. He denies fever, chills or body aches. He has no history of allergies or asthma. He has not had sick contacts that he is aware of. He is not a current smoker.  Review of Systems   History reviewed. No pertinent past medical history.  History reviewed. No pertinent family history.  History   Social History  . Marital Status: Married    Spouse Name: N/A    Number of Children: N/A  . Years of Education: N/A   Occupational History  . Not on file.   Social History Main Topics  . Smoking status: Former Research scientist (life sciences)  . Smokeless tobacco: Never Used     Comment: quit 6 years ago  . Alcohol Use: No  . Drug Use: Not on file  . Sexual Activity: Not on file   Other Topics Concern  . Not on file   Social History Narrative  . No narrative on file    No Known Allergies   Constitutional: Positive headache, fatigue. Denies fever or abrupt weight changes.  HEENT:  Positive eye pain, pressure behind the eyes, facial pain, nasal congestion and sore throat. Denies eye redness, ear pain, ringing in the ears, wax buildup, runny nose or bloody nose. Respiratory: Positive cough. Denies difficulty breathing or shortness of breath.  Cardiovascular: Denies chest pain, chest tightness, palpitations or swelling in the hands or feet.   No other specific complaints in a complete review of systems (except as listed in HPI above).  Objective:    General: Appears his stated age, well developed, well nourished in NAD. HEENT: Head: normal shape and size, maxillary and frontal sinus tenderness noted; Eyes: sclera white, no icterus, conjunctiva pink, PERRLA and EOMs intact;  Ears: Tm's gray and intact, normal light reflex, + serous effusion left ear; Nose: mucosa boggy and moist, septum midline; Throat/Mouth: + PND. Teeth present, mucosa pink and moist, no exudate noted, no lesions or ulcerations noted.  Neck: Neck supple, trachea midline. No massses, lumps or thyromegaly present.  Cardiovascular: Normal rate and rhythm. S1,S2 noted.  No murmur, rubs or gallops noted. No JVD or BLE edema. No carotid bruits noted. Pulmonary/Chest: Normal effort and positive vesicular breath sounds. No respiratory distress. No wheezes, rales or ronchi noted.      Assessment & Plan:   Acute bacterial sinusitis  Can use a Neti Pot which can be purchased from your local drug store. Flonase 2 sprays each nostril for 3 days and then as needed. Now OTC Augmentin BID for 10 days  RTC as needed or if symptoms persist.

## 2014-06-29 DIAGNOSIS — G894 Chronic pain syndrome: Secondary | ICD-10-CM | POA: Diagnosis not present

## 2014-07-27 DIAGNOSIS — Z79899 Other long term (current) drug therapy: Secondary | ICD-10-CM | POA: Diagnosis not present

## 2014-07-27 DIAGNOSIS — M961 Postlaminectomy syndrome, not elsewhere classified: Secondary | ICD-10-CM | POA: Diagnosis not present

## 2014-07-27 DIAGNOSIS — G894 Chronic pain syndrome: Secondary | ICD-10-CM | POA: Diagnosis not present

## 2014-08-03 ENCOUNTER — Emergency Department: Payer: Self-pay | Admitting: Student

## 2014-08-03 DIAGNOSIS — I10 Essential (primary) hypertension: Secondary | ICD-10-CM | POA: Diagnosis not present

## 2014-08-03 DIAGNOSIS — G510 Bell's palsy: Secondary | ICD-10-CM | POA: Diagnosis not present

## 2014-08-03 DIAGNOSIS — R209 Unspecified disturbances of skin sensation: Secondary | ICD-10-CM | POA: Diagnosis not present

## 2014-08-03 DIAGNOSIS — R29810 Facial weakness: Secondary | ICD-10-CM | POA: Diagnosis not present

## 2014-08-03 LAB — CBC
HCT: 43.9 % (ref 40.0–52.0)
HEMOGLOBIN: 14.9 g/dL
HGB: 14.9 g/dL (ref 13.0–18.0)
MCH: 30.1 pg (ref 26.0–34.0)
MCHC: 34.1 g/dL (ref 32.0–36.0)
MCV: 88 fL (ref 80–100)
PLATELETS: 240 10*3/uL (ref 150–440)
RBC: 4.96 10*6/uL (ref 4.40–5.90)
RDW: 13.3 % (ref 11.5–14.5)
WBC: 6.9
WBC: 6.9 10*3/uL (ref 3.8–10.6)
platelet count: 240

## 2014-08-03 LAB — BASIC METABOLIC PANEL
ANION GAP: 6 — AB (ref 7–16)
BUN: 13 mg/dL (ref 4–21)
BUN: 13 mg/dL (ref 7–18)
CALCIUM: 8.7 mg/dL (ref 8.5–10.1)
CHLORIDE: 103 mmol/L (ref 98–107)
CREATININE: 0.82
Co2: 30 mmol/L (ref 21–32)
Creatinine: 0.82 mg/dL (ref 0.60–1.30)
EGFR (African American): >60
EGFR (Non-African Amer.): >60
Glucose: 115
Glucose: 115 mg/dL — ABNORMAL HIGH (ref 65–99)
Osmolality: 279 (ref 275–301)
POTASSIUM: 4.9 mmol/L (ref 3.5–5.1)
Potassium: 4.9 mmol/L
Sodium: 139 mmol/L (ref 136–145)

## 2014-08-07 ENCOUNTER — Telehealth: Payer: Self-pay | Admitting: Family Medicine

## 2014-08-07 ENCOUNTER — Ambulatory Visit: Payer: Medicare Other | Admitting: Family Medicine

## 2014-08-07 DIAGNOSIS — Z0289 Encounter for other administrative examinations: Secondary | ICD-10-CM

## 2014-08-07 NOTE — Telephone Encounter (Signed)
Patient did not come for their scheduled appointment today. Patient has new patient appointment 08/16/14 Please let me know if the patient needs to be contacted immediately for follow up or if no follow up is necessary.

## 2014-08-09 NOTE — Telephone Encounter (Signed)
Noted thanks °

## 2014-08-09 NOTE — Telephone Encounter (Signed)
Will await f/u visit. Looks like pt had 2 new patient appts scheduled? 8/25 and 9/3?

## 2014-08-09 NOTE — Telephone Encounter (Signed)
Pt had(and still does have) a new patient appt for 08/16/14.  The pt ended up going to the ED for dx of Bells Palsy and needed an ER follow up made.  Appt for F/U made for 8/25 since you were soon to be his new doctor.  Pt no showed the ER follow up.  Has New patient appt on 9/3.

## 2014-08-16 ENCOUNTER — Encounter: Payer: Self-pay | Admitting: Family Medicine

## 2014-08-16 ENCOUNTER — Ambulatory Visit (INDEPENDENT_AMBULATORY_CARE_PROVIDER_SITE_OTHER): Payer: Medicare Other | Admitting: Family Medicine

## 2014-08-16 ENCOUNTER — Encounter (INDEPENDENT_AMBULATORY_CARE_PROVIDER_SITE_OTHER): Payer: Self-pay

## 2014-08-16 VITALS — BP 130/86 | HR 120 | Temp 98.6°F | Ht 74.0 in | Wt 346.5 lb

## 2014-08-16 DIAGNOSIS — E669 Obesity, unspecified: Secondary | ICD-10-CM

## 2014-08-16 DIAGNOSIS — I10 Essential (primary) hypertension: Secondary | ICD-10-CM

## 2014-08-16 DIAGNOSIS — M5137 Other intervertebral disc degeneration, lumbosacral region: Secondary | ICD-10-CM

## 2014-08-16 DIAGNOSIS — M51379 Other intervertebral disc degeneration, lumbosacral region without mention of lumbar back pain or lower extremity pain: Secondary | ICD-10-CM

## 2014-08-16 DIAGNOSIS — M5136 Other intervertebral disc degeneration, lumbar region: Secondary | ICD-10-CM

## 2014-08-16 DIAGNOSIS — G510 Bell's palsy: Secondary | ICD-10-CM

## 2014-08-16 DIAGNOSIS — Z6835 Body mass index (BMI) 35.0-35.9, adult: Secondary | ICD-10-CM

## 2014-08-16 DIAGNOSIS — J019 Acute sinusitis, unspecified: Secondary | ICD-10-CM | POA: Diagnosis not present

## 2014-08-16 DIAGNOSIS — E66811 Obesity, class 1: Secondary | ICD-10-CM | POA: Insufficient documentation

## 2014-08-16 HISTORY — DX: Morbid (severe) obesity due to excess calories: E66.01

## 2014-08-16 MED ORDER — AMOXICILLIN-POT CLAVULANATE 875-125 MG PO TABS
1.0000 | ORAL_TABLET | Freq: Two times a day (BID) | ORAL | Status: AC
Start: 2014-08-16 — End: 2014-08-26

## 2014-08-16 NOTE — Progress Notes (Addendum)
BP 130/86  Pulse 120  Temp(Src) 98.6 F (37 C) (Oral)  Ht 6\' 2"  (1.88 m)  Wt 346 lb 8 oz (157.171 kg)  BMI 44.47 kg/m2   CC: new pt to establish care - seen a few months back for acute issue by Webb Silversmith.  Subjective:    Patient ID: Larry Goodwin, male    DOB: 27-Sep-1976, 38 y.o.   MRN: [id number]  HPI: Larry Goodwin is a 38 y.o. male presenting on 08/16/2014 for Establish Care   Prior saw Dr. Hardin Negus.   2 wks ago seen at ER, dx with R Bell's Palsy. Seen on same day of Bell's Palsy onset. Treated with acyclovir 800mg  5x/day, 7d course as well as prednisone course. Now having R sided facial pain at area of bell's palsy, describes tender to touch, marked facial pressure and R ear pain. + sinus congestion but no fevers or significant sore throat/PNrainage. Did recently 2 teeth pulled.  H/o R bell's palsy as 3rd grader. Treated with B12 shots.   H/o DDD has seen Dr. Trenton Gammon s/p 2 surgeries. Weight gain since back issues. 20lbs noted.   HTN - longstanding, has been on lisinopril and controlling well. Tachycardia noted today.   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  Relevant past medical, surgical, family and social history reviewed and updated as indicated.  Allergies and medications reviewed and updated. Current Outpatient Prescriptions on File Prior to Visit  Medication Sig  . lisinopril (PRINIVIL,ZESTRIL) 40 MG tablet Take 40 mg by mouth at bedtime.  . Oxycodone HCl 10 MG TABS Take 5 mg by mouth 3 (three) times daily.   No current facility-administered medications on file prior to visit.     Review of Systems Per HPI unless specifically indicated above    Objective:    BP 130/86  Pulse 120  Temp(Src) 98.6 F (37 C) (Oral)  Ht 6\' 2"  (1.88 m)  Wt 346 lb 8 oz (157.171 kg)  BMI 44.47 kg/m2  Physical Exam  Nursing note and vitals reviewed. Constitutional: He appears well-developed and well-nourished. No distress.  HENT:  Head: Normocephalic and atraumatic.  Right Ear: Hearing, tympanic membrane, external ear and ear canal normal.  Left Ear: Hearing, tympanic membrane, external ear and ear canal normal.  Nose: Mucosal edema present.  Mouth/Throat: Uvula is midline, oropharynx is clear and moist and mucous membranes are normal. No oropharyngeal exudate, posterior oropharyngeal edema, posterior oropharyngeal erythema or tonsillar abscesses.  Decreased size of L nasal passage  Eyes: Conjunctivae and EOM are normal. Pupils are equal, round, and reactive to light.  Neck: Normal range of motion. Neck supple. No thyromegaly present.  Cardiovascular: Normal rate, regular rhythm, normal heart sounds and intact distal pulses.   No murmur heard. Pulmonary/Chest: Effort normal and breath sounds normal. No respiratory distress. He has no wheezes. He has no rales.  Musculoskeletal: He exhibits no edema.  Lymphadenopathy:    He has no cervical adenopathy.  Skin: Skin is warm and dry. No rash noted.  Psychiatric: He has a normal mood and affect.       Assessment & Plan:  Problem List Items Addressed This Visit   Obesity     Encouraged continued walking regularly. Discussed healthy diet for cardiovascular benefit including mediterranean diet. Body mass index is 44.47 kg/(m^2).    HTN (hypertension)     Chronic, stable on lisinopril. Tachycardia attributed today to acute sinus infection    DDD (degenerative disc disease), lumbar     Chronic, s/p surgeries. On narcotic for this.    Bell's palsy     Completed appropriate therapy - hopefully facial function will continue  to improve.    Acute sinusitis - Primary     Story suspicious for R sided sinus infection - treat with 10 d augmentin course. Update if sxs persist or worsen.    Relevant Medications      amoxicillin-clavulanate (AUGMENTIN) tablet 875-125 mg       Follow up plan: Return in about 3 months (around 11/15/2014), or as needed, for physical.   ADDENDUM ==> reviewed [facility] ER records - CBC, BMP WNL, head CT WNL.

## 2014-08-16 NOTE — Assessment & Plan Note (Signed)
Chronic, stable on lisinopril. Tachycardia attributed today to acute sinus infection

## 2014-08-16 NOTE — Assessment & Plan Note (Signed)
Story suspicious for R sided sinus infection - treat with 10 d augmentin course. Update if sxs persist or worsen.

## 2014-08-16 NOTE — Progress Notes (Signed)
Pre visit review using our clinic review tool, if applicable. No additional management support is needed unless otherwise documented below in the visit note. 

## 2014-08-16 NOTE — Assessment & Plan Note (Signed)
Encouraged continued walking regularly. Discussed healthy diet for cardiovascular benefit including mediterranean diet. Body mass index is 44.47 kg/(m^2).

## 2014-08-16 NOTE — Patient Instructions (Addendum)
Your goal blood pressure is <140/90. Work on low salt/sodium diet - goal <1.5gm (1,500mg ) per day. Eat a diet high in fruits/vegetables and whole grains.  Look into mediterranean and DASH diet. Goal activity is 136min/wk of moderate intensity exercise.  This can be split into 30 minute chunks.  If you are not at this level, you can start with smaller 10-15 min increments and slowly build up activity. Look at [email or web address] for more resources  I think this is a sinus infection - treat with 10 day course of augmentin.

## 2014-08-16 NOTE — Progress Notes (Deleted)
   BP 130/86  Pulse 120  Temp(Src) 98.6 F (37 C) (Oral)  Ht 6\' 2"  (1.88 m)  Wt 346 lb 8 oz (157.171 kg)  BMI 44.47 kg/m2   CC: new pt to establish care - seen a few months back for acute issue by Webb Silversmith.  Subjective:    Patient ID: Larry Goodwin, male    DOB: 1976-02-15, 38 y.o.   MRN: [id number]  HPI: Larry Goodwin is a 38 y.o. male presenting on 08/16/2014 for Establish Care   Prior saw Dr. Hardin Negus.   2 wks ago seen at ER, dx with R Bell's Palsy. Treated with acyclovir 800mg  5x/day, 7d course as well as prednisone course.   H/o R bell's palsy as 3rd grader. Treated with B12 shots.   H/o DDD has seen Dr. Trenton Gammon s/p 2 surgeries. Weight gain since back issues. 20lbs noted.   HTN - longstanding, has been on lisinopril and controlling well. Tachycardia noted today.  Obesity - struggling recently.  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  Relevant past medical, surgical, family and social history reviewed and updated as indicated.  Allergies and medications reviewed and updated. Current Outpatient Prescriptions on File Prior to Visit  Medication Sig  . lisinopril (PRINIVIL,ZESTRIL) 40 MG tablet Take 40 mg by mouth at bedtime.  . Oxycodone HCl 10 MG TABS Take 5 mg by mouth 3 (three) times daily.   No current facility-administered medications on file prior to visit.    Review of Systems Per HPI unless specifically indicated above    Objective:    BP 130/86  Pulse 120  Temp(Src) 98.6 F (37 C) (Oral)  Ht 6\' 2"  (1.88 m)  Wt 346 lb 8 oz (157.171 kg)  BMI 44.47 kg/m2  Physical Exam Results for  orders placed during the hospital encounter of 09/30/11  POCT I-STAT, CHEM 8      Result Value Ref Range   Sodium 140  135 - 145 mEq/L   Potassium 3.4 (*) 3.5 - 5.1 mEq/L   Chloride 99  96 - 112 mEq/L   BUN 9  6 - 23 mg/dL   Creatinine, Ser 0.80  0.50 - 1.35 mg/dL   Glucose, Bld 90  70 - 99 mg/dL   Calcium, Ion 1.14  1.12 - 1.32 mmol/L   TCO2 26  0 - 100 mmol/L   Hemoglobin 14.3  13.0 - 17.0 g/dL   HCT 42.0  39.0 - 52.0 %      Assessment & Plan:   Problem List Items Addressed This Visit   None       Follow up plan: No Follow-up on file.

## 2014-08-16 NOTE — Assessment & Plan Note (Signed)
Chronic, s/p surgeries. On narcotic for this.

## 2014-08-16 NOTE — Assessment & Plan Note (Signed)
Completed appropriate therapy - hopefully facial function will continue to improve.

## 2014-08-24 ENCOUNTER — Encounter: Payer: Self-pay | Admitting: *Deleted

## 2014-08-24 DIAGNOSIS — Z79899 Other long term (current) drug therapy: Secondary | ICD-10-CM | POA: Diagnosis not present

## 2014-08-24 DIAGNOSIS — G894 Chronic pain syndrome: Secondary | ICD-10-CM | POA: Diagnosis not present

## 2014-08-24 DIAGNOSIS — M5430 Sciatica, unspecified side: Secondary | ICD-10-CM | POA: Diagnosis not present

## 2014-08-24 DIAGNOSIS — M961 Postlaminectomy syndrome, not elsewhere classified: Secondary | ICD-10-CM | POA: Diagnosis not present

## 2014-09-21 ENCOUNTER — Telehealth: Payer: Self-pay

## 2014-09-21 DIAGNOSIS — M5136 Other intervertebral disc degeneration, lumbar region: Secondary | ICD-10-CM

## 2014-09-21 DIAGNOSIS — G894 Chronic pain syndrome: Secondary | ICD-10-CM

## 2014-09-21 MED ORDER — OXYCODONE HCL 5 MG PO TABS: 5.0000 mg | ORAL_TABLET | ORAL | Status: DC | PRN

## 2014-09-21 MED ORDER — OXYCODONE HCL 5 MG PO TABS: 5.0000 mg | ORAL_TABLET | Freq: Four times a day (QID) | ORAL | Status: DC | PRN

## 2014-09-21 NOTE — Telephone Encounter (Signed)
Spoke with Deana at [place] office. Confirmed office did close recently. She said there were 3 offices he could go to ([place], [place], or [place], [place]) all of which are very good distances from here. She could not give me a reason for them closing or if any patients were even notified of the closure. Spoke with patient and he would like a referral to new PM clinic as the others in their group are too far. Rx placed up front for pick up with ROI form attached to get records from previous PM clinic for referral.

## 2014-09-21 NOTE — Telephone Encounter (Signed)
plz call [place] office to confirm and see what their plan is for established patients - do they need to be referred to new pain clinic? I called [facility] to verify what pt has been filling - 5mg  oxycodone #135/month max 5/day. May provide 1 mo supply for oxycodone. #120. Printed and in [place]' box. If pt desires we may refer to new pain clinic as well.

## 2014-09-21 NOTE — Telephone Encounter (Signed)
Pt had appt today at pain mgt; [facility] on [address] in [place]. When pt got to office for appt there was a sign on the door that office had closed (permanently) and a phone # for the [place] office.Pt will be out of med today. Pt is presently taking oxycodone 5 mg four times a day. Pt request rx until get a new pain mgt doctor. Pt request cb and would like to pick up rx today.Please advise. If you need to confirm with pain mgt in [place] phone # [phone].

## 2014-09-22 NOTE — Telephone Encounter (Signed)
Noted  Referral placed.

## 2014-09-30 ENCOUNTER — Encounter: Payer: Self-pay | Admitting: Family Medicine

## 2014-09-30 DIAGNOSIS — G894 Chronic pain syndrome: Secondary | ICD-10-CM | POA: Insufficient documentation

## 2014-10-12 ENCOUNTER — Other Ambulatory Visit: Payer: Self-pay

## 2014-10-12 MED ORDER — OXYCODONE HCL 5 MG PO TABS: 5.0000 mg | ORAL_TABLET | Freq: Four times a day (QID) | ORAL | Status: DC | PRN

## 2014-10-12 NOTE — Telephone Encounter (Signed)
Printed and placed in [place] 'box. Early for refill - may fill after 10/16/2014.

## 2014-10-12 NOTE — Telephone Encounter (Signed)
Message left notifying patient and Rx placed up front for pick up. 

## 2014-10-12 NOTE — Telephone Encounter (Signed)
Pt left v/m requesting rx oxycodone. Call when ready for pick up. 

## 2014-10-22 ENCOUNTER — Ambulatory Visit (INDEPENDENT_AMBULATORY_CARE_PROVIDER_SITE_OTHER): Payer: Medicare Other | Admitting: Family Medicine

## 2014-10-22 ENCOUNTER — Encounter: Payer: Self-pay | Admitting: Family Medicine

## 2014-10-22 VITALS — BP 144/100 | HR 80 | Temp 98.6°F | Wt 355.5 lb

## 2014-10-22 DIAGNOSIS — J029 Acute pharyngitis, unspecified: Secondary | ICD-10-CM | POA: Diagnosis not present

## 2014-10-22 DIAGNOSIS — G510 Bell's palsy: Secondary | ICD-10-CM

## 2014-10-22 LAB — POCT RAPID STREP A (OFFICE): RAPID STREP A SCREEN: NEGATIVE

## 2014-10-22 MED ORDER — ACYCLOVIR 400 MG PO TABS: 400.0000 mg | ORAL_TABLET | Freq: Every day | ORAL | Status: DC

## 2014-10-22 MED ORDER — PREDNISONE 20 MG PO TABS: ORAL_TABLET | ORAL | Status: DC

## 2014-10-22 NOTE — Assessment & Plan Note (Addendum)
Consistent with recurrent acute bell's facial nerve palsy, now affecting left side. Restart steroid course (60mg  prednisone daily for 5 days) and acyclovir course (400mg  5x/day for 7 days). Pt agrees with plan. Handout provided. RST negative

## 2014-10-22 NOTE — Patient Instructions (Addendum)
Strep test negative today. You have recurrent bell's palsy. Treat with prednisone course and antiviral course. Let me know how facial weakness progresses.  Bell's Palsy Bell's palsy is a condition in which the muscles on one side of the face cannot move (paralysis). This is because the nerves in the face are paralyzed. It is most often thought to be caused by a virus. The virus causes swelling of the nerve that controls movement on one side of the face. The nerve travels through a tight space surrounded by bone. When the nerve swells, it can be compressed by the bone. This results in damage to the protective covering around the nerve. This damage interferes with how the nerve communicates with the muscles of the face. As a result, it can cause weakness or paralysis of the facial muscles.  Injury (trauma), tumor, and surgery may cause Bell's palsy, but most of the time the cause is unknown. It is a relatively common condition. It starts suddenly (abrupt onset) with the paralysis usually ending within 2 days. Bell's palsy is not dangerous. But because the eye does not close properly, you may need care to keep the eye from getting dry. This can include splinting (to keep the eye shut) or moistening with artificial tears. Bell's palsy very seldom occurs on both sides of the face at the same time. SYMPTOMS   Eyebrow sagging.  Drooping of the eyelid and corner of the mouth.  Inability to close one eye.  Loss of taste on the front of the tongue.  Sensitivity to loud noises. TREATMENT  The treatment is usually non-surgical. If the patient is seen within the first 24 to 48 hours, a short course of steroids may be prescribed, in an attempt to shorten the length of the condition. Antiviral medicines may also be used with the steroids, but it is unclear if they are helpful.  You will need to protect your eye, if you cannot close it. The cornea (clear covering over your eye) will become dry and can be  damaged. Artificial tears can be used to keep your eye moist. Glasses or an eye patch should be worn to protect your eye. PROGNOSIS  Recovery is variable, ranging from days to months. Although the problem usually goes away completely (about 80% of cases resolve), predicting the outcome is impossible. Most people improve within 3 weeks of when the symptoms began. Improvement may continue for 3 to 6 months. A small number of people have moderate to severe weakness that is permanent.  HOME CARE INSTRUCTIONS   If your caregiver prescribed medication to reduce swelling in the nerve, use as directed. Do not stop taking the medication unless directed by your caregiver.  Use moisturizing eye drops as needed to prevent drying of your eye, as directed by your caregiver.  Protect your eye, as directed by your caregiver.  Use facial massage and exercises, as directed by your caregiver.  Perform your normal activities, and get your normal rest. SEEK IMMEDIATE MEDICAL CARE IF:   There is pain, redness or irritation in the eye.  You or your child has an oral temperature above 102 F (38.9 C), not controlled by medicine. MAKE SURE YOU:   Understand these instructions.  Will watch your condition.  Will get help right away if you are not doing well or get worse. Document Released: 11/30/2005 Document Revised: 02/22/2012 Document Reviewed: 03/09/2014 [facility] Patient Information 2015 [place], [place]. This information is not intended to replace advice given to you by your health  care provider. Make sure you discuss any questions you have with your health care provider.

## 2014-10-22 NOTE — Progress Notes (Signed)
BP 144/100 mmHg  Pulse 80  Temp(Src) 98.6 F (37 C) (Oral)  Wt 355 lb 8 oz (161.254 kg)   CC: bell's palsy, ?sinusitis  Subjective:    Patient ID: Larry Goodwin, male    DOB: 10-08-1976, 38 y.o.   MRN: [id number]  HPI: Larry Goodwin is a 38 y.o. male presenting on 10/22/2014 for Bells Palsey and Sinusitis   Thursday started feeling tired. Still coached football. Looked at throat with flashlight - saw blisters/white spots. Started taking left over augmentin 500/125mg  from prior sinus infection (so far 3 days worth). Now with L facial maxillary pain that radiates to side of head. L ear pain. Some nasal congestion. L ST persists. +fatigue.  Denies fevers/chills, cough, PNdrainage or significant sinus congestion.   Saturday morning started noticing L facial muscle weakness  H/o R bell's palsy - 07/2014 treated with acyclovir and prednisone course, fully recovered.   Relevant past medical, surgical, family and social history reviewed and updated as indicated.  Allergies and medications reviewed and updated. Current Outpatient Prescriptions on File Prior to Visit  Medication Sig  . lisinopril (PRINIVIL,ZESTRIL) 40 MG tablet Take 40 mg by mouth at bedtime.  Marland Kitchen oxyCODONE (OXY IR/ROXICODONE) 5 MG immediate release tablet Take 1 tablet (5 mg total) by mouth every 6 (six) hours as needed for severe pain.   No current facility-administered medications on file prior to visit.   Past Medical History  Diagnosis Date  . DDD (degenerative disc disease), lumbar     severe s/p 2 surgeries  . HTN (hypertension)   . Bell's palsy 2015    right then left sides  . Morbid obesity 08/16/2014  . Chronic pain syndrome     s/p 2 back surgeries Trenton Gammon)    Review of Systems Per HPI unless specifically indicated above    Objective:    BP 144/100 mmHg  Pulse 80  Temp(Src) 98.6 F (37 C) (Oral)  Wt 355 lb 8 oz (161.254 kg)  Physical Exam  Constitutional: He appears well-developed and  well-nourished. No distress.  HENT:  Head: Normocephalic and atraumatic.  Right Ear: Hearing, tympanic membrane, external ear and ear canal normal.  Left Ear: Hearing, tympanic membrane, external ear and ear canal normal.  Nose: No mucosal edema or rhinorrhea. Right sinus exhibits no maxillary sinus tenderness and no frontal sinus tenderness. Left sinus exhibits maxillary sinus tenderness. Left sinus exhibits no frontal sinus tenderness.  Mouth/Throat: Uvula is midline and mucous membranes are normal. Oropharyngeal exudate, posterior oropharyngeal edema and posterior oropharyngeal erythema present. No tonsillar abscesses.  Eyes: Conjunctivae and EOM are normal. Pupils are equal, round, and reactive to light. No scleral icterus.  Neck: Normal range of motion. Neck supple.  Cardiovascular: Normal rate, regular rhythm, normal heart sounds and intact distal pulses.   No murmur heard. Pulmonary/Chest: Effort normal and breath sounds normal. No respiratory distress. He has no wheezes. He has no rales.  Lymphadenopathy:    He has no cervical adenopathy.  Neurological: A cranial nerve deficit is present. No sensory deficit.  Isolated left facial nerve palsy Strength BUE and BLE intact  Skin: Skin is warm and dry. No rash noted.  No vesicular rash appreciated  Nursing note and vitals reviewed.      Assessment & Plan:   Problem List Items Addressed This Visit    Bell's palsy - Primary    Consistent with recurrent acute bell's facial nerve palsy, now affecting left side. Restart steroid course (60mg  prednisone daily for  5 days) and acyclovir course (400mg  5x/day for 7 days). Pt agrees with plan. Handout provided. RST negative     Other Visit Diagnoses    Sore throat        Relevant Orders       POCT rapid strep A (Completed)        Follow up plan: No Follow-up on file.

## 2014-10-22 NOTE — Progress Notes (Signed)
Pre visit review using our clinic review tool, if applicable. No additional management support is needed unless otherwise documented below in the visit note. 

## 2014-10-29 ENCOUNTER — Ambulatory Visit: Payer: Self-pay | Admitting: Pain Medicine

## 2014-10-29 DIAGNOSIS — M47817 Spondylosis without myelopathy or radiculopathy, lumbosacral region: Secondary | ICD-10-CM | POA: Diagnosis not present

## 2014-10-29 DIAGNOSIS — G894 Chronic pain syndrome: Secondary | ICD-10-CM | POA: Diagnosis not present

## 2014-10-29 DIAGNOSIS — M5136 Other intervertebral disc degeneration, lumbar region: Secondary | ICD-10-CM | POA: Diagnosis not present

## 2014-10-29 DIAGNOSIS — M5126 Other intervertebral disc displacement, lumbar region: Secondary | ICD-10-CM | POA: Diagnosis not present

## 2014-10-29 DIAGNOSIS — M5416 Radiculopathy, lumbar region: Secondary | ICD-10-CM | POA: Diagnosis not present

## 2014-10-29 DIAGNOSIS — M545 Low back pain: Secondary | ICD-10-CM | POA: Diagnosis not present

## 2014-10-29 DIAGNOSIS — M546 Pain in thoracic spine: Secondary | ICD-10-CM | POA: Diagnosis not present

## 2014-11-05 ENCOUNTER — Ambulatory Visit: Payer: Self-pay | Admitting: Pain Medicine

## 2014-11-05 ENCOUNTER — Telehealth: Payer: Self-pay | Admitting: Family Medicine

## 2014-11-05 DIAGNOSIS — F4542 Pain disorder with related psychological factors: Secondary | ICD-10-CM | POA: Diagnosis not present

## 2014-11-05 DIAGNOSIS — G894 Chronic pain syndrome: Secondary | ICD-10-CM | POA: Diagnosis not present

## 2014-11-05 LAB — BASIC METABOLIC PANEL
ANION GAP: 7 (ref 7–16)
BUN: 9 mg/dL (ref 7–18)
CREATININE: 0.91 mg/dL (ref 0.60–1.30)
Calcium, Total: 8 mg/dL — ABNORMAL LOW (ref 8.5–10.1)
Chloride: 104 mmol/L (ref 98–107)
Co2: 27 mmol/L (ref 21–32)
GLUCOSE: 225 mg/dL — AB (ref 65–99)
OSMOLALITY: 281 (ref 275–301)
Potassium: 4.3 mmol/L (ref 3.5–5.1)
SODIUM: 138 mmol/L (ref 136–145)

## 2014-11-05 LAB — HEPATIC FUNCTION PANEL A (ARMC)
Albumin: 3.5 g/dL (ref 3.4–5.0)
Alkaline Phosphatase: 58 U/L
Bilirubin, Direct: <0.1 mg/dL (ref 0.0–0.2)
Bilirubin,Total: 0.6 mg/dL (ref 0.2–1.0)
SGOT(AST): 22 U/L (ref 15–37)
SGPT (ALT): 30 U/L
Total Protein: 7 g/dL (ref 6.4–8.2)

## 2014-11-05 LAB — MAGNESIUM: Magnesium: 1.7 mg/dL — ABNORMAL LOW

## 2014-11-05 LAB — SEDIMENTATION RATE: ERYTHROCYTE SED RATE: 12 mm/h (ref 0–15)

## 2014-11-05 NOTE — Telephone Encounter (Signed)
Pt called, spoke to [place], then I spoke with him at length re: possibly changing from Dr. Dossie Arbour to another pain clinic.  I explained that we would be happy to start the process over again, but that pain clinic appointments usually take weeks to receive and that is only after the provider reviews the records.  Pt was concerned about getting oxyCODONE (OXY IR/ROXICODONE) 5 MG immediate release tablet [[id number]] refilled.  Stated that he could request refill after 30 days and it was up to Dr. Danise Mina to determine.   Pt chose to keep appointments with Dr. Dossie Arbour and the subsequent referring appointments with the psychologist.  He will call us if he would like to proceed with a different pain clinic in the future. Best number for pt is [phone].  No further action is needed at this time. / lt

## 2014-11-05 NOTE — Telephone Encounter (Signed)
Noted. Thanks.

## 2014-11-10 ENCOUNTER — Other Ambulatory Visit: Payer: Self-pay | Admitting: Family Medicine

## 2014-11-10 DIAGNOSIS — I10 Essential (primary) hypertension: Secondary | ICD-10-CM

## 2014-11-12 ENCOUNTER — Other Ambulatory Visit (INDEPENDENT_AMBULATORY_CARE_PROVIDER_SITE_OTHER): Payer: Medicare Other

## 2014-11-12 DIAGNOSIS — I10 Essential (primary) hypertension: Secondary | ICD-10-CM | POA: Diagnosis not present

## 2014-11-12 LAB — COMPREHENSIVE METABOLIC PANEL
ALT: 24 U/L (ref 0–53)
AST: 16 U/L (ref 0–37)
Albumin: 3.9 g/dL (ref 3.5–5.2)
Alkaline Phosphatase: 49 U/L (ref 39–117)
BILIRUBIN TOTAL: 0.8 mg/dL (ref 0.2–1.2)
BUN: 9 mg/dL (ref 6–23)
CALCIUM: 8.5 mg/dL (ref 8.4–10.5)
CHLORIDE: 99 meq/L (ref 96–112)
CO2: 30 meq/L (ref 19–32)
CREATININE: 0.8 mg/dL (ref 0.4–1.5)
GFR: 121.24 mL/min (ref >60.00)
GLUCOSE: 145 mg/dL — AB (ref 70–99)
Potassium: 4.1 mEq/L (ref 3.5–5.1)
Sodium: 135 mEq/L (ref 135–145)
Total Protein: 7 g/dL (ref 6.0–8.3)

## 2014-11-12 LAB — LIPID PANEL
Cholesterol: 198 mg/dL (ref 0–200)
HDL: 38 mg/dL — AB (ref >39.00)
LDL Cholesterol: 141 mg/dL — ABNORMAL HIGH (ref 0–99)
NonHDL: 160
Total CHOL/HDL Ratio: 5
Triglycerides: 96 mg/dL (ref 0.0–149.0)
VLDL: 19.2 mg/dL (ref 0.0–40.0)

## 2014-11-12 MED ORDER — OXYCODONE HCL 5 MG PO TABS
5.0000 mg | ORAL_TABLET | Freq: Four times a day (QID) | ORAL | Status: DC | PRN
Start: 2014-11-12 — End: 2015-10-01

## 2014-11-12 NOTE — Addendum Note (Signed)
Addended by: Ria Bush on: 11/12/2014 08:21 AM   Modules accepted: Orders

## 2014-11-12 NOTE — Telephone Encounter (Signed)
Pt in office, requests 1 more refill of oxycodone - then future refills should come from Dr Consuela Mimes. Refilled today. #120.

## 2014-11-19 ENCOUNTER — Ambulatory Visit (INDEPENDENT_AMBULATORY_CARE_PROVIDER_SITE_OTHER): Payer: Medicare Other | Admitting: Family Medicine

## 2014-11-19 ENCOUNTER — Encounter: Payer: Self-pay | Admitting: Family Medicine

## 2014-11-19 VITALS — BP 152/100 | HR 64 | Temp 98.4°F | Ht 74.0 in | Wt 357.2 lb

## 2014-11-19 DIAGNOSIS — J019 Acute sinusitis, unspecified: Secondary | ICD-10-CM | POA: Diagnosis not present

## 2014-11-19 DIAGNOSIS — Z8639 Personal history of other endocrine, nutritional and metabolic disease: Secondary | ICD-10-CM | POA: Insufficient documentation

## 2014-11-19 DIAGNOSIS — I10 Essential (primary) hypertension: Secondary | ICD-10-CM | POA: Diagnosis not present

## 2014-11-19 DIAGNOSIS — R739 Hyperglycemia, unspecified: Secondary | ICD-10-CM

## 2014-11-19 DIAGNOSIS — Z0000 Encounter for general adult medical examination without abnormal findings: Secondary | ICD-10-CM | POA: Diagnosis not present

## 2014-11-19 DIAGNOSIS — G510 Bell's palsy: Secondary | ICD-10-CM

## 2014-11-19 DIAGNOSIS — E1169 Type 2 diabetes mellitus with other specified complication: Secondary | ICD-10-CM | POA: Insufficient documentation

## 2014-11-19 DIAGNOSIS — E118 Type 2 diabetes mellitus with unspecified complications: Secondary | ICD-10-CM | POA: Insufficient documentation

## 2014-11-19 DIAGNOSIS — G894 Chronic pain syndrome: Secondary | ICD-10-CM | POA: Diagnosis not present

## 2014-11-19 MED ORDER — AMOXICILLIN-POT CLAVULANATE 875-125 MG PO TABS: 1.0000 | ORAL_TABLET | Freq: Two times a day (BID) | ORAL | Status: AC

## 2014-11-19 NOTE — Assessment & Plan Note (Signed)
L slowly recovering.

## 2014-11-19 NOTE — Assessment & Plan Note (Signed)
Fasting glu in diabetic range.  Reviewed implications of this. Pt requests 3 mo of lifestyle changes and then recheck levels.

## 2014-11-19 NOTE — Assessment & Plan Note (Signed)
[facility] established [facility].

## 2014-11-19 NOTE — Assessment & Plan Note (Signed)
Weight gain noted. Discussed with patient. rec stop sodas and only drink water, start incorporating regular exercise into routine.

## 2014-11-19 NOTE — Patient Instructions (Addendum)
Start incorporating exercise into routine (treadmill?). Change sodas to water or unsweet tea. For sinusitis - treat with augmentin 10 day course. Return in 3 months for follow up. Good to see you today, call us with questions.

## 2014-11-19 NOTE — Addendum Note (Signed)
Addended by: Ria Bush on: 11/19/2014 03:29 PM   Modules accepted: Level of Service, SmartSet

## 2014-11-19 NOTE — Assessment & Plan Note (Signed)
Ongoing sxs - will treat for acute bacterial sinusitis with augmentin course.

## 2014-11-19 NOTE — Progress Notes (Signed)
Pre visit review using our clinic review tool, if applicable. No additional management support is needed unless otherwise documented below in the visit note. 

## 2014-11-19 NOTE — Assessment & Plan Note (Signed)
Chronic, stable. Continue lisinopril 40mg daily.  

## 2014-11-19 NOTE — Assessment & Plan Note (Signed)

## 2014-11-19 NOTE — Progress Notes (Addendum)
BP 152/100 mmHg  Pulse 64  Temp(Src) 98.4 F (36.9 C) (Oral)  Ht 6\' 2"  (1.88 m)  Wt 357 lb 4 oz (162.048 kg)  BMI 45.85 kg/m2   CC: medicare wellness visit  Subjective:    Patient ID: Larry Goodwin, male    DOB: Jan 17, 1976, 38 y.o.   MRN: [id number]  HPI: Larry Goodwin is a 38 y.o. male presenting on 11/19/2014 for Annual Exam and Sinusitis   H/o DDD has seen Dr. Trenton Gammon s/p 2 surgeries. Weight gain since back issues. 20lbs noted. On disability for back.  H/o recurrent bell's palsy, last month was 3rd episode, 2nd in last few months (first right then left). Treated with acyclovir and prednisone course.  Ongoing sinus congestion since last visit, previously thought viral. Has persisted. Endorse thick green nasal discharge as well as mild dizziness with rapid head movements. No fevers/chills.   HTN - bp elevated today. Compliant with lisinopril 40mg  daily. At home BP better controlled.   Obesity - motivated to start some exercise regimen. May buy treadmill - talking with wife.  Passes hearing screen today Passes vision screen today Denies depression/anhedonia, sadness  Preventative: Preventative protocols reviewed. 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 Edu: HS Activity: coaches Diet: good water, fruits/vegetables daily  Relevant past medical, surgical, family and social history reviewed and updated as indicated. Interim medical history since our last visit reviewed. Allergies and medications reviewed and updated.  Current Outpatient Prescriptions on File Prior to Visit  Medication Sig  . lisinopril (PRINIVIL,ZESTRIL) 40 MG tablet Take 40 mg by mouth at bedtime.  Marland Kitchen oxyCODONE (OXY IR/ROXICODONE) 5 MG immediate release tablet Take 1 tablet (5 mg total) by mouth every 6 (six)  hours as needed for severe pain.   No current facility-administered medications on file prior to visit.    Review of Systems Per HPI unless specifically indicated above     Objective:    BP 152/100 mmHg  Pulse 64  Temp(Src) 98.4 F (36.9 C) (Oral)  Ht 6\' 2"  (1.88 m)  Wt 357 lb 4 oz (162.048 kg)  BMI 45.85 kg/m2  Wt Readings from Last 3 Encounters:  11/19/14 357 lb 4 oz (162.048 kg)  10/22/14 355 lb 8 oz (161.254 kg)  08/16/14 346 lb 8 oz (157.171 kg)    Physical Exam  Constitutional: He is oriented to person, place, and time. He appears well-developed and well-nourished. No distress.  Morbidly obese  HENT:  Head: Normocephalic and atraumatic.  Right Ear: Hearing, tympanic membrane, external ear and ear canal normal.  Left Ear: Hearing, tympanic membrane, external ear and ear canal normal.  Nose: Nose normal.  Mouth/Throat: Uvula is midline, oropharynx is clear and moist and mucous membranes are normal. No oropharyngeal exudate, posterior oropharyngeal edema or posterior oropharyngeal erythema.  Eyes: Conjunctivae and EOM are normal. Pupils are equal, round, and reactive to light. No scleral icterus.  Neck: Normal range of motion. Neck supple. No thyromegaly present.  Cardiovascular: Normal rate, regular rhythm, normal heart sounds and intact distal pulses.   No murmur heard. Pulses:      Radial pulses are 2+ on the right side, and 2+ on the left side.  Pulmonary/Chest: Effort normal and breath sounds normal. No respiratory distress. He has no wheezes. He has no rales.  Abdominal: Soft. Bowel sounds are normal. He exhibits no distension and no mass. There is no tenderness. There is no rebound and no guarding.  Musculoskeletal: Normal range of motion. He exhibits no edema.  Lymphadenopathy:    He has no cervical adenopathy.  Neurological: He is alert and oriented to person, place, and time.  CN grossly intact, station and gait intact  Skin: Skin is warm and dry. No rash  noted.  Psychiatric: He has a normal mood and affect. His behavior is normal. Judgment and thought content normal.  Nursing note and vitals reviewed.  Results for orders placed or performed in visit on 11/12/14  Lipid panel  Result Value Ref Range   Cholesterol 198 0 - 200 mg/dL   Triglycerides 96.0 0.0 - 149.0 mg/dL   HDL 38.00 (L) >39.00 mg/dL   VLDL 19.2 0.0 - 40.0 mg/dL   LDL Cholesterol 141 (H) 0 - 99 mg/dL   Total CHOL/HDL Ratio 5    NonHDL 160.00   Comprehensive metabolic panel  Result Value Ref Range   Sodium 135 135 - 145 mEq/L   Potassium 4.1 3.5 - 5.1 mEq/L   Chloride 99 96 - 112 mEq/L   CO2 30 19 - 32 mEq/L   Glucose, Bld 145 (H) 70 - 99 mg/dL   BUN 9 6 - 23 mg/dL   Creatinine, Ser 0.8 0.4 - 1.5 mg/dL   Total Bilirubin 0.8 0.2 - 1.2 mg/dL   Alkaline Phosphatase 49 39 - 117 U/L   AST 16 0 - 37 U/L   ALT 24 0 - 53 U/L   Total Protein 7.0 6.0 - 8.3 g/dL   Albumin 3.9 3.5 - 5.2 g/dL   Calcium 8.5 8.4 - 10.5 mg/dL   GFR 121.24 >60.00 mL/min      Assessment & Plan:   Problem List Items Addressed This Visit    Morbid obesity    Weight gain noted. Discussed with patient. rec stop sodas and only drink water, start incorporating regular exercise into routine.    Medicare annual wellness visit, initial - Primary    I have personally reviewed the Medicare Annual Wellness questionnaire and have noted 1. The patient's medical and social history 2. Their use of alcohol, tobacco or illicit drugs 3. Their current medications and supplements 4. The patient's functional ability including ADL's, fall risks, home safety risks and hearing or visual impairment. 5. Diet and physical activity 6. Evidence for depression or mood disorders The patients weight, height, BMI have been recorded in the chart.  Hearing and vision has been addressed. I have made referrals, counseling and provided education to the patient based review of the above and I have provided the pt with a written  personalized care plan for preventive services. Provider list updated - see scanned questionairre.  Reviewed preventative protocols and updated unless pt declined.    Hyperglycemia    Fasting glu in diabetic range.  Reviewed implications of this. Pt requests 3 mo of lifestyle changes and then recheck levels.    HTN (hypertension)    Chronic, stable. Continue lisinopril 40mg  daily.    Chronic pain syndrome    [facility] established [facility].    Bell's palsy    L slowly recovering.    Acute sinusitis    Ongoing sxs - will treat for acute bacterial sinusitis with augmentin course.    Relevant Medications      amoxicillin-clavulanate (AUGMENTIN) tablet 875-125 mg       Follow up  plan: Return in about 4 months (around 03/21/2015), or as needed, for follow up visit.

## 2014-11-20 ENCOUNTER — Telehealth: Payer: Self-pay | Admitting: Family Medicine

## 2014-11-20 NOTE — Telephone Encounter (Signed)
emmi emailed °

## 2014-12-11 DIAGNOSIS — Z79891 Long term (current) use of opiate analgesic: Secondary | ICD-10-CM | POA: Diagnosis not present

## 2015-01-14 ENCOUNTER — Ambulatory Visit: Payer: Self-pay | Admitting: Pain Medicine

## 2015-01-14 DIAGNOSIS — M545 Low back pain: Secondary | ICD-10-CM | POA: Diagnosis not present

## 2015-01-14 LAB — COMPREHENSIVE METABOLIC PANEL
Albumin: 3.8 g/dL (ref 3.4–5.0)
Alkaline Phosphatase: 50 U/L (ref 46–116)
Anion Gap: 5 — ABNORMAL LOW (ref 7–16)
BILIRUBIN TOTAL: 0.7 mg/dL (ref 0.2–1.0)
BUN: 11 mg/dL (ref 7–18)
CHLORIDE: 103 mmol/L (ref 98–107)
CO2: 30 mmol/L (ref 21–32)
CREATININE: 0.78 mg/dL (ref 0.60–1.30)
Calcium, Total: 8.6 mg/dL (ref 8.5–10.1)
EGFR (African American): >60
Glucose: 113 mg/dL — ABNORMAL HIGH (ref 65–99)
Osmolality: 276 (ref 275–301)
Potassium: 4.3 mmol/L (ref 3.5–5.1)
SGOT(AST): 22 U/L (ref 15–37)
SGPT (ALT): 32 U/L (ref 14–63)
Sodium: 138 mmol/L (ref 136–145)
Total Protein: 7 g/dL (ref 6.4–8.2)

## 2015-01-14 LAB — SEDIMENTATION RATE: Erythrocyte Sed Rate: 7 mm/hr (ref 0–15)

## 2015-01-25 ENCOUNTER — Encounter: Payer: Self-pay | Admitting: Nurse Practitioner

## 2015-01-25 ENCOUNTER — Ambulatory Visit (INDEPENDENT_AMBULATORY_CARE_PROVIDER_SITE_OTHER): Payer: Medicare Other | Admitting: Nurse Practitioner

## 2015-01-25 VITALS — BP 140/80 | HR 90 | Temp 97.8°F | Resp 14 | Ht 74.0 in | Wt 355.1 lb

## 2015-01-25 DIAGNOSIS — J019 Acute sinusitis, unspecified: Secondary | ICD-10-CM

## 2015-01-25 MED ORDER — AMOXICILLIN-POT CLAVULANATE 875-125 MG PO TABS: 1.0000 | ORAL_TABLET | Freq: Two times a day (BID) | ORAL | Status: DC

## 2015-01-25 NOTE — Progress Notes (Addendum)
   Subjective:    Patient ID: Larry Goodwin, male    DOB: May 22, 1976, 39 y.o.   MRN: [id number]  HPI  Larry Goodwin is a 39 yo male with a CC of sinusitis.   1)  Augmentin twice in 6 months, pt states it helps each time. Has not been referred to ENT before. Complains of sinus pressure maxillary, throat swollen, and headache. Also, he reports ear pressure on right side > left side, and nasal congestion x 1 week.  Mints- helpful somewhat  Not tried anything else     Review of Systems  Constitutional: Positive for fatigue. Negative for fever, chills and diaphoresis.  HENT: Positive for congestion, rhinorrhea, sinus pressure and sore throat. Negative for ear discharge, ear pain and sneezing.   Respiratory: Negative for cough, chest tightness, shortness of breath and wheezing.   Gastrointestinal: Negative for nausea, vomiting and diarrhea.  Skin: Negative for rash.  Neurological: Positive for headaches. Negative for weakness and numbness.       Objective:   Physical Exam  Constitutional: He is oriented to person, place, and time. He appears well-developed and well-nourished. No distress.  BP 140/80 mmHg  Pulse 90  Temp(Src) 97.8 F (36.6 C)  Resp 14  Ht 6\' 2"  (1.88 m)  Wt 355 lb 1.9 oz (161.081 kg)  BMI 45.58 kg/m2  SpO2 98%   HENT:  Head: Normocephalic and atraumatic.  Right Ear: External ear normal.  Left Ear: External ear normal.  Mouth/Throat: No oropharyngeal exudate.  TM's clear bilaterally Red irritated oropharynx Pain of maxillary sinuses with palpation  Eyes: Conjunctivae and EOM are normal. Pupils are equal, round, and reactive to light. Right eye exhibits no discharge. Left eye exhibits no discharge. No scleral icterus.  Neck: Normal range of motion. Neck supple. No thyromegaly present.  Cardiovascular: Normal rate, regular rhythm, normal heart sounds and intact distal pulses.  Exam reveals no gallop and no friction rub.   No murmur heard. Pulmonary/Chest:  Effort normal and breath sounds normal. No respiratory distress. He has no wheezes. He has no rales. He exhibits no tenderness.  Lymphadenopathy:    He has no cervical adenopathy.  Neurological: He is alert and oriented to person, place, and time.  Skin: Skin is warm and dry. No rash noted. He is not diaphoretic.  Psychiatric: He has a normal mood and affect. His behavior is normal. Judgment and thought content normal.      Assessment & Plan:

## 2015-01-25 NOTE — Patient Instructions (Addendum)
Wait 3 days. If symptoms worsen, take the antibiotic.   Call if worsening or failing to improve after the antibiotics.

## 2015-01-25 NOTE — Progress Notes (Signed)
Pre visit review using our clinic review tool, if applicable. No additional management support is needed unless otherwise documented below in the visit note. 

## 2015-01-27 NOTE — Assessment & Plan Note (Signed)
Patient has had multiple episodes in the last year of acute sinusitis. Pt is 7 days into this and worsening. Asked pt to try Benadryl OTC at night for drainage and saline nasal spray to rinse out nose. If not helpful after 3 days try the Augmentin twice daily for 7 days. FU with provider if worsening/failure to improve.

## 2015-02-12 DIAGNOSIS — G8929 Other chronic pain: Secondary | ICD-10-CM | POA: Diagnosis not present

## 2015-02-12 DIAGNOSIS — Z79891 Long term (current) use of opiate analgesic: Secondary | ICD-10-CM | POA: Diagnosis not present

## 2015-02-12 DIAGNOSIS — M961 Postlaminectomy syndrome, not elsewhere classified: Secondary | ICD-10-CM | POA: Diagnosis not present

## 2015-02-12 DIAGNOSIS — M545 Low back pain: Secondary | ICD-10-CM | POA: Diagnosis not present

## 2015-03-12 ENCOUNTER — Other Ambulatory Visit: Payer: Self-pay | Admitting: Family Medicine

## 2015-03-12 DIAGNOSIS — R739 Hyperglycemia, unspecified: Secondary | ICD-10-CM

## 2015-03-12 DIAGNOSIS — I10 Essential (primary) hypertension: Secondary | ICD-10-CM

## 2015-03-14 ENCOUNTER — Other Ambulatory Visit: Payer: Medicare Other

## 2015-03-21 ENCOUNTER — Ambulatory Visit: Payer: Medicare Other | Admitting: Family Medicine

## 2015-03-21 DIAGNOSIS — Z0289 Encounter for other administrative examinations: Secondary | ICD-10-CM

## 2015-04-11 DIAGNOSIS — G8929 Other chronic pain: Secondary | ICD-10-CM | POA: Diagnosis not present

## 2015-04-11 DIAGNOSIS — M961 Postlaminectomy syndrome, not elsewhere classified: Secondary | ICD-10-CM | POA: Diagnosis not present

## 2015-04-11 DIAGNOSIS — Z79891 Long term (current) use of opiate analgesic: Secondary | ICD-10-CM | POA: Diagnosis not present

## 2015-04-11 DIAGNOSIS — M545 Low back pain: Secondary | ICD-10-CM | POA: Diagnosis not present

## 2015-04-11 DIAGNOSIS — M47816 Spondylosis without myelopathy or radiculopathy, lumbar region: Secondary | ICD-10-CM | POA: Diagnosis not present

## 2015-06-19 ENCOUNTER — Encounter: Payer: Self-pay | Admitting: Family Medicine

## 2015-06-19 ENCOUNTER — Ambulatory Visit (INDEPENDENT_AMBULATORY_CARE_PROVIDER_SITE_OTHER): Payer: Medicare Other | Admitting: Family Medicine

## 2015-06-19 VITALS — BP 160/96 | HR 88 | Temp 98.1°F | Wt 343.5 lb

## 2015-06-19 DIAGNOSIS — I10 Essential (primary) hypertension: Secondary | ICD-10-CM | POA: Diagnosis not present

## 2015-06-19 DIAGNOSIS — J019 Acute sinusitis, unspecified: Secondary | ICD-10-CM | POA: Diagnosis not present

## 2015-06-19 MED ORDER — AMOXICILLIN-POT CLAVULANATE 875-125 MG PO TABS: 1.0000 | ORAL_TABLET | Freq: Two times a day (BID) | ORAL | Status: DC

## 2015-06-19 NOTE — Patient Instructions (Addendum)
If your BP isn't much better at home, then let us know.   Start augmentin, take care.  Glad to see you.  Keep going with your weight loss.  Thanks for your effort.

## 2015-06-19 NOTE — Progress Notes (Signed)
BP elevation- white coat hx likely- has been controlled at home, especially with weight loss.  Not on any supplements.  Encouraged about his weight loss.    Sx started a few weeks ago.  "I had been living with it as long as I could."  Facial pain.  Stuffy.  HA recently.  ST- mild.  No cough.  No vomiting, no diarrhea.  No ear pain but more popping recently.  He has a h/o similar sinus infections prev.    Recheck BP 160/96  Meds, vitals, and allergies reviewed.   ROS: See HPI.  Otherwise, noncontributory.  GEN: nad, alert and oriented HEENT: mucous membranes moist, tm w/o erythema, nasal exam w/o erythema, clear discharge noted,  OP with cobblestoning, max sinuses ttp B NECK: supple w/o LA CV: rrr.   PULM: ctab, no inc wob EXT: no edema SKIN: no acute rash

## 2015-06-20 NOTE — Assessment & Plan Note (Signed)
Nontoxic, start augmentin, f/u prn.  He agrees.  Supportive care o/w.

## 2015-06-20 NOTE — Assessment & Plan Note (Signed)
He'll update PCP if BP isn't controlled at home.  Recheck improved from initial check, but not fully controlled. Still okay for outpatient f/u.

## 2015-06-20 NOTE — Assessment & Plan Note (Signed)
He is working hard on weight loss with diet and then exercise as tolerated.

## 2015-07-04 ENCOUNTER — Other Ambulatory Visit: Payer: Self-pay | Admitting: Family Medicine

## 2015-07-04 NOTE — Telephone Encounter (Signed)
Shouldn't need a refill.  What is going on in this case?  Thanks.

## 2015-07-04 NOTE — Telephone Encounter (Signed)
Patient says he seems to get about 3 of these sinus infections a year.  He normally gets a 10 day course of ABX but was given a 7 day course this time and he still has sinus pressure and is still getting a "yucky green" discharge from his nose.  Patient says he just doesn't feel like it is taken care of yet.

## 2015-07-04 NOTE — Telephone Encounter (Signed)
Electronic refill request. Last Filled:    14 tablet 0 RF on 06/19/2015  Please advise.

## 2015-07-04 NOTE — Telephone Encounter (Signed)
Sent. Thanks.  F/u prn if not better.

## 2015-07-05 ENCOUNTER — Other Ambulatory Visit
Admission: RE | Admit: 2015-07-05 | Discharge: 2015-07-05 | Disposition: A | Discharge status: Home / Self Care | Payer: Medicare Other | Source: Ambulatory Visit | Attending: Pain Medicine | Admitting: Pain Medicine

## 2015-07-05 DIAGNOSIS — M545 Low back pain: Secondary | ICD-10-CM | POA: Insufficient documentation

## 2015-07-05 DIAGNOSIS — G8929 Other chronic pain: Secondary | ICD-10-CM | POA: Insufficient documentation

## 2015-07-05 LAB — COMPREHENSIVE METABOLIC PANEL
ALT: 29 U/L (ref 17–63)
AST: 22 U/L (ref 15–41)
Albumin: 4.2 g/dL (ref 3.5–5.0)
Alkaline Phosphatase: 50 U/L (ref 38–126)
Anion gap: 7 (ref 5–15)
BUN: 11 mg/dL (ref 6–20)
CALCIUM: 8.6 mg/dL — AB (ref 8.9–10.3)
CO2: 29 mmol/L (ref 22–32)
Chloride: 102 mmol/L (ref 101–111)
Creatinine, Ser: 0.82 mg/dL (ref 0.61–1.24)
GFR calc Af Amer: >60 mL/min (ref >60)
Glucose, Bld: 122 mg/dL — ABNORMAL HIGH (ref 65–99)
Potassium: 4.1 mmol/L (ref 3.5–5.1)
SODIUM: 138 mmol/L (ref 135–145)
Total Bilirubin: 0.7 mg/dL (ref 0.3–1.2)
Total Protein: 7.3 g/dL (ref 6.5–8.1)

## 2015-07-05 LAB — SEDIMENTATION RATE: Sed Rate: 6 mm/hr (ref 0–15)

## 2015-07-05 LAB — MAGNESIUM: Magnesium: 1.8 mg/dL (ref 1.7–2.4)

## 2015-07-08 DIAGNOSIS — Z79891 Long term (current) use of opiate analgesic: Secondary | ICD-10-CM | POA: Diagnosis not present

## 2015-08-13 ENCOUNTER — Ambulatory Visit (INDEPENDENT_AMBULATORY_CARE_PROVIDER_SITE_OTHER): Payer: Medicare Other | Admitting: Family Medicine

## 2015-08-13 ENCOUNTER — Encounter: Payer: Self-pay | Admitting: Family Medicine

## 2015-08-13 VITALS — BP 174/100 | HR 96 | Temp 98.0°F | Wt 342.8 lb

## 2015-08-13 DIAGNOSIS — I10 Essential (primary) hypertension: Secondary | ICD-10-CM | POA: Diagnosis not present

## 2015-08-13 DIAGNOSIS — J029 Acute pharyngitis, unspecified: Secondary | ICD-10-CM | POA: Diagnosis not present

## 2015-08-13 DIAGNOSIS — J019 Acute sinusitis, unspecified: Secondary | ICD-10-CM

## 2015-08-13 MED ORDER — FLUTICASONE PROPIONATE 50 MCG/ACT NA SUSP: 2.0000 | Freq: Every day | NASAL | Status: DC

## 2015-08-13 MED ORDER — LISINOPRIL 20 MG PO TABS: 20.0000 mg | ORAL_TABLET | Freq: Two times a day (BID) | ORAL | Status: DC

## 2015-08-13 MED ORDER — LEVOFLOXACIN 500 MG PO TABS: 500.0000 mg | ORAL_TABLET | Freq: Every day | ORAL | Status: DC

## 2015-08-13 NOTE — Patient Instructions (Signed)
You have another sinus infection. Take medicine as prescribed: levaquin 500mg  daily for 10 days Push fluids and plenty of rest. Nasal saline irrigation or neti pot to help drain sinuses. May use plain mucinex with plenty of fluid to help mobilize mucous. Please let us know if fever >101.5, trouble opening/closing mouth, difficulty swallowing, or worsening instead of improving as expected.  Start flonase nasal spray (at pharmacy) Split lisinopril dose to 20mg  twic daily (new dose sent to pharmacy) Return to see me in 1 month for recheck blood pressure and recheck sinuses.

## 2015-08-13 NOTE — Progress Notes (Signed)
BP 174/100 mmHg  Pulse 96  Temp(Src) 98 F (36.7 C) (Oral)  Wt 342 lb 12 oz (155.47 kg)   CC: ?sinusitis  Subjective:    Patient ID: Larry Goodwin, male    DOB: 04/21/1976, 39 y.o.   MRN: [id number]  HPI: Larry Goodwin is a 39 y.o. male presenting on 08/13/2015 for Sinusitis   Presents with wife today.  Recurrent sinusitis - seen 06/19/2015 by Dr Damita Dunnings with dx sinusitis, treated with 7d augmentin course - pills actually started cracking after 1-2 days of taking ?exired from pharmacy.   Persistent sinus congestion ongoing for 2+ months, sinus pressure and fullness associated with "swimmy headed" feeling. Very sensitive to sounds with L ear pain. + rhinorrhea alternating with congestion, PNdrainage. Gets dizzy very easily. Burning feeling in sinuses. Multiple sinusitis over last 1-2 yrs. Tends to have more trouble during summer months.   No fevers/chills, sore throat. No drainage from ears, tooth pain.   Has seen ENT for tinnitus but not recently.   BP very elevated today - pt reports compliance with lisinopril 40mg  daily. Last checked BP several weeks ago.   Obesity - Body mass index is 43.99 kg/(m^2). interested in nutritionist.   Relevant past medical, surgical, family and social history reviewed and updated as indicated. Interim medical history since our last visit reviewed. Allergies and medications reviewed and updated. Current Outpatient Prescriptions on File Prior to Visit  Medication Sig  . oxyCODONE (OXY IR/ROXICODONE) 5 MG immediate release tablet Take 1 tablet (5 mg total) by mouth every 6 (six) hours as needed for severe pain.   No current facility-administered medications on file prior to visit.    Review of Systems Per HPI unless specifically indicated above     Objective:    BP 174/100 mmHg  Pulse 96  Temp(Src) 98 F (36.7 C) (Oral)  Wt 342 lb 12 oz (155.47 kg)  Wt Readings from Last 3 Encounters:  08/13/15 342 lb 12 oz (155.47 kg)  06/19/15  343 lb 8 oz (155.811 kg)  01/25/15 355 lb 1.9 oz (161.081 kg)    Physical Exam  Constitutional: He appears well-developed and well-nourished. No distress.  Morbidly obese  HENT:  Head: Normocephalic and atraumatic.  Right Ear: Hearing, tympanic membrane, external ear and ear canal normal.  Left Ear: Hearing, tympanic membrane, external ear and ear canal normal.  Nose: Mucosal edema (and injection L>R) present. No rhinorrhea. Right sinus exhibits maxillary sinus tenderness and frontal sinus tenderness. Left sinus exhibits maxillary sinus tenderness and frontal sinus tenderness.  Mouth/Throat: Uvula is midline and mucous membranes are normal. Oropharyngeal exudate, posterior oropharyngeal edema and posterior oropharyngeal erythema present. No tonsillar abscesses.  L tonsillar enlargement, erythema and some exudate present  Eyes: Conjunctivae and EOM are normal. Pupils are equal, round, and reactive to light. No scleral icterus.  Neck: Normal range of motion. Neck supple.  Cardiovascular: Normal rate, regular rhythm, normal heart sounds and intact distal pulses.   No murmur heard. Pulmonary/Chest: Effort normal and breath sounds normal. No respiratory distress. He has no wheezes. He has no rales.  Lymphadenopathy:    He has no cervical adenopathy.  Skin: Skin is warm and dry. No rash noted.  Nursing note and vitals reviewed.      Assessment & Plan:   Problem List Items Addressed This Visit    Acute sinusitis - Primary    Acute on chronic sinusitis - ongoing for 2+ months. Along with acute likely streptococcal pharyngitis. Anticipate dizziness  steming from sinus congestion. Did not respond to incomplete augmentin course - will treat with levaquin 500mg  10d course and I have asked pt to update [place] with effect of treatment. rec nasal saline, flonase, mucinex. If not improving with treatment, consider sinus CT to further eval for chronic sinusitis and longer antibiotic course +/- prednisone  course.      Relevant Medications   levofloxacin (LEVAQUIN) 500 MG tablet   fluticasone (FLONASE) 50 MCG/ACT nasal spray   HTN (hypertension)    Chronic, elevated. Currently taking lisinopril 40mg  nightly. Will split dose to 20mg  BID, return in 1 mo for recheck.      Relevant Medications   lisinopril (PRINIVIL,ZESTRIL) 20 MG tablet   Severe obesity (BMI >= 40)    Pt will consider nutritionist referral once sinusitis sxs resolving.       Other Visit Diagnoses    Acute pharyngitis, unspecified pharyngitis type            Follow up plan: Return in about 4 weeks (around 09/10/2015), or as needed, for follow up visit.

## 2015-08-13 NOTE — Progress Notes (Signed)
Pre visit review using our clinic review tool, if applicable. No additional management support is needed unless otherwise documented below in the visit note. 

## 2015-08-13 NOTE — Assessment & Plan Note (Signed)
Pt will consider nutritionist referral once sinusitis sxs resolving.

## 2015-08-13 NOTE — Assessment & Plan Note (Signed)
Acute on chronic sinusitis - ongoing for 2+ months. Along with acute likely streptococcal pharyngitis. Anticipate dizziness steming from sinus congestion. Did not respond to incomplete augmentin course - will treat with levaquin 500mg  10d course and I have asked pt to update [place] with effect of treatment. rec nasal saline, flonase, mucinex. If not improving with treatment, consider sinus CT to further eval for chronic sinusitis and longer antibiotic course +/- prednisone course.

## 2015-08-13 NOTE — Assessment & Plan Note (Signed)
Chronic, elevated. Currently taking lisinopril 40mg  nightly. Will split dose to 20mg  BID, return in 1 mo for recheck.

## 2015-08-14 ENCOUNTER — Telehealth: Payer: Self-pay | Admitting: Family Medicine

## 2015-08-14 MED ORDER — AMOXICILLIN-POT CLAVULANATE 875-125 MG PO TABS: 1.0000 | ORAL_TABLET | Freq: Two times a day (BID) | ORAL | Status: AC

## 2015-08-14 NOTE — Telephone Encounter (Signed)
Pt called and said abx prescribed (lavaquin?) was too expensive and would like augmentin called in. He has previously taken augmentin and has no reaction to that med. Pt states he has a lot of reactions to medications and would like to stick with what he knows works.   [facility]  [phone]

## 2015-08-14 NOTE — Telephone Encounter (Signed)
Pt called to check on status. Pt wants rx printed and not sent into pharmacy b/c he wants to take to different pharmacy's to see which one is cheaper.

## 2015-08-14 NOTE — Telephone Encounter (Signed)
Printed and in Kim's box. Take all 10 days then call us with update esp if persistent sinus congestion or dizziness.

## 2015-08-14 NOTE — Telephone Encounter (Signed)
Patient aware and Rx placed up front for pick up.

## 2015-09-13 ENCOUNTER — Ambulatory Visit: Payer: Medicare Other | Admitting: Family Medicine

## 2015-09-13 ENCOUNTER — Telehealth: Payer: Self-pay | Admitting: Family Medicine

## 2015-09-13 NOTE — Telephone Encounter (Signed)
Pt did not come in for their appt today for follow up. Please let me know if pt needs to be contacted immediately for follow up or no follow up needed. Best phone number to contact pt is [phone].

## 2015-09-15 NOTE — Telephone Encounter (Signed)
Pt needs to f/u for bp check - as was previously too high. plz schedule f/u appt.

## 2015-10-01 ENCOUNTER — Encounter: Payer: Self-pay | Admitting: Pain Medicine

## 2015-10-01 ENCOUNTER — Ambulatory Visit: Payer: Medicare Other | Attending: Pain Medicine | Admitting: Pain Medicine

## 2015-10-01 VITALS — BP 140/102 | HR 95 | Temp 98.1°F | Resp 20 | Ht 74.0 in | Wt 340.0 lb

## 2015-10-01 DIAGNOSIS — F1190 Opioid use, unspecified, uncomplicated: Secondary | ICD-10-CM | POA: Diagnosis not present

## 2015-10-01 DIAGNOSIS — R937 Abnormal findings on diagnostic imaging of other parts of musculoskeletal system: Secondary | ICD-10-CM

## 2015-10-01 DIAGNOSIS — Z6841 Body Mass Index (BMI) 40.0 and over, adult: Secondary | ICD-10-CM | POA: Insufficient documentation

## 2015-10-01 DIAGNOSIS — E559 Vitamin D deficiency, unspecified: Secondary | ICD-10-CM | POA: Insufficient documentation

## 2015-10-01 DIAGNOSIS — F329 Major depressive disorder, single episode, unspecified: Secondary | ICD-10-CM | POA: Insufficient documentation

## 2015-10-01 DIAGNOSIS — M51369 Other intervertebral disc degeneration, lumbar region without mention of lumbar back pain or lower extremity pain: Secondary | ICD-10-CM

## 2015-10-01 DIAGNOSIS — M533 Sacrococcygeal disorders, not elsewhere classified: Secondary | ICD-10-CM

## 2015-10-01 DIAGNOSIS — M545 Low back pain, unspecified: Secondary | ICD-10-CM

## 2015-10-01 DIAGNOSIS — Z87891 Personal history of nicotine dependence: Secondary | ICD-10-CM | POA: Diagnosis not present

## 2015-10-01 DIAGNOSIS — M7918 Myalgia, other site: Secondary | ICD-10-CM | POA: Insufficient documentation

## 2015-10-01 DIAGNOSIS — G8929 Other chronic pain: Secondary | ICD-10-CM | POA: Diagnosis not present

## 2015-10-01 DIAGNOSIS — Z9889 Other specified postprocedural states: Secondary | ICD-10-CM | POA: Insufficient documentation

## 2015-10-01 DIAGNOSIS — F32A Depression, unspecified: Secondary | ICD-10-CM | POA: Insufficient documentation

## 2015-10-01 DIAGNOSIS — Z5181 Encounter for therapeutic drug level monitoring: Secondary | ICD-10-CM

## 2015-10-01 DIAGNOSIS — G9619 Other disorders of meninges, not elsewhere classified: Secondary | ICD-10-CM | POA: Diagnosis not present

## 2015-10-01 DIAGNOSIS — M47896 Other spondylosis, lumbar region: Secondary | ICD-10-CM | POA: Diagnosis not present

## 2015-10-01 DIAGNOSIS — M539 Dorsopathy, unspecified: Secondary | ICD-10-CM

## 2015-10-01 DIAGNOSIS — M5136 Other intervertebral disc degeneration, lumbar region: Secondary | ICD-10-CM | POA: Diagnosis not present

## 2015-10-01 DIAGNOSIS — F411 Generalized anxiety disorder: Secondary | ICD-10-CM | POA: Insufficient documentation

## 2015-10-01 DIAGNOSIS — Z79891 Long term (current) use of opiate analgesic: Secondary | ICD-10-CM

## 2015-10-01 DIAGNOSIS — M961 Postlaminectomy syndrome, not elsewhere classified: Secondary | ICD-10-CM

## 2015-10-01 DIAGNOSIS — I10 Essential (primary) hypertension: Secondary | ICD-10-CM | POA: Diagnosis not present

## 2015-10-01 DIAGNOSIS — M4726 Other spondylosis with radiculopathy, lumbar region: Secondary | ICD-10-CM

## 2015-10-01 DIAGNOSIS — M5126 Other intervertebral disc displacement, lumbar region: Secondary | ICD-10-CM | POA: Insufficient documentation

## 2015-10-01 DIAGNOSIS — G894 Chronic pain syndrome: Secondary | ICD-10-CM | POA: Diagnosis not present

## 2015-10-01 DIAGNOSIS — M549 Dorsalgia, unspecified: Secondary | ICD-10-CM | POA: Diagnosis present

## 2015-10-01 DIAGNOSIS — M47816 Spondylosis without myelopathy or radiculopathy, lumbar region: Secondary | ICD-10-CM | POA: Insufficient documentation

## 2015-10-01 DIAGNOSIS — D1809 Hemangioma of other sites: Secondary | ICD-10-CM

## 2015-10-01 DIAGNOSIS — F1120 Opioid dependence, uncomplicated: Secondary | ICD-10-CM

## 2015-10-01 DIAGNOSIS — Z79899 Other long term (current) drug therapy: Secondary | ICD-10-CM

## 2015-10-01 DIAGNOSIS — G96198 Other disorders of meninges, not elsewhere classified: Secondary | ICD-10-CM | POA: Insufficient documentation

## 2015-10-01 DIAGNOSIS — M5124 Other intervertebral disc displacement, thoracic region: Secondary | ICD-10-CM

## 2015-10-01 DIAGNOSIS — R790 Abnormal level of blood mineral: Secondary | ICD-10-CM | POA: Insufficient documentation

## 2015-10-01 DIAGNOSIS — R9089 Other abnormal findings on diagnostic imaging of central nervous system: Secondary | ICD-10-CM

## 2015-10-01 HISTORY — DX: Postlaminectomy syndrome, not elsewhere classified: M96.1

## 2015-10-01 MED ORDER — OXYCODONE HCL 5 MG PO TABS: 5.0000 mg | ORAL_TABLET | Freq: Four times a day (QID) | ORAL | Status: DC | PRN

## 2015-10-01 NOTE — Progress Notes (Signed)
Patient's Name: Larry Goodwin MRN: [id number] DOB: May 07, 1976 DOS: 10/01/2015  Primary Reason(s) for Visit: Encounter for Medication Management. CC: Back Pain   HPI:   Larry Goodwin is a 39 y.o. year old, male patient, who returns today as an established patient. He has Acute sinusitis; DDD (degenerative disc disease), lumbar; HTN (hypertension); Bell's palsy; Severe obesity (BMI >= 40) ([place]); Chronic pain syndrome; Medicare annual wellness visit, initial; Hyperglycemia; History of tobacco abuse; Low magnesium levels; Vitamin D deficiency; Myofascial pain syndrome; Facet syndrome, lumbar; Chronic upper back pain; Sacroiliac joint pain; Generalized anxiety disorder; Depression;  lumbar spondylosis with radiculopathy; Epidural fibrosis; Failed back surgical syndrome; Encounter for therapeutic drug level monitoring; Long term prescription opiate use; Long term current use of opiate analgesic; Uncomplicated opioid dependence ([place]); Opiate use; Chronic low back pain; Chronic pain; Chronic right sacroiliac joint pain; Abnormal MRI, lumbar spine;  Abnormal CT myelogram of the thoracic spine; Thoracic disc herniation; Bulge of lumbar disc without myelopathy; and Vertebral body hemangioma on his problem list.. His primarily concern today is the Back Pain    The patient indicates doing rather well with the medications and does not seem to be interested in any interventional therapies at this time.  Pharmacotherapy Review: Side-effects or Adverse reactions: None reported. Effectiveness: Described as relatively effective, allowing for increase in activities of daily living (ADL). Onset of action: Within expected pharmacological parameters. Duration of action: Within normal limits for medication. Peak effect: Timing and results are as within normal expected parameters. [place] PMP: Compliant with practice rules and regulations. DST: Compliant with practice rules and regulations. Lab work: No new labs ordered by  our practice. Treatment compliance: Compliant. Substance Use Disorder (SUD) Risk Level: Low Planned course of action: Continue therapy as is.  Allergies: Larry Goodwin has No Known Allergies.  Meds: The patient has a current medication list which includes the following prescription(s): lisinopril, oxycodone, fluticasone, levofloxacin, oxycodone, and oxycodone. Requested Prescriptions   Signed Prescriptions Disp Refills  . oxyCODONE (OXY IR/ROXICODONE) 5 MG immediate release tablet 120 tablet 0    Sig: Take 1 tablet (5 mg total) by mouth every 6 (six) hours as needed for severe pain.  Marland Kitchen oxyCODONE (OXY IR/ROXICODONE) 5 MG immediate release tablet 120 tablet 0    Sig: Take 1 tablet (5 mg total) by mouth every 6 (six) hours as needed for severe pain.  Marland Kitchen oxyCODONE (OXY IR/ROXICODONE) 5 MG immediate release tablet 120 tablet 0    Sig: Take 1 tablet (5 mg total) by mouth every 6 (six) hours as needed for severe pain.    ROS: Constitutional: Afebrile, no chills, well hydrated and well nourished Gastrointestinal: negative Musculoskeletal:negative Neurological: negative Behavioral/Psych: negative  PFSH: Medical:  Larry Goodwin  has a past medical history of DDD (degenerative disc disease), lumbar; HTN (hypertension); Bell's palsy (2015); Morbid obesity ([place]) (08/16/2014); and Chronic pain syndrome. Family: family history includes COPD in his mother; Hypertension in his other. There is no history of Stroke, Cancer, Diabetes, or CAD. Surgical:  has past surgical history that includes Back surgery (2012) and Cholecystectomy. Tobacco:  reports that he quit smoking about 7 years ago. He has never used smokeless tobacco. Alcohol:  reports that he does not drink alcohol. Drug:  reports that he does not use illicit drugs.  Physical Exam: Vitals:  Today's Vitals   10/01/15 1437  BP: 140/102  Pulse: 95  Temp: 98.1 F (36.7 C)  TempSrc: Oral  Resp: 20  Height: 6\' 2"  (1.88 m)  Weight: 340  lb  (154.223 kg)  SpO2: 99%  PainSc: 4   Calculated BMI: Body mass index is 43.63 kg/(m^2). General appearance: alert, cooperative, appears stated age and moderately obese Eyes: conjunctivae/corneas clear. PERRL, EOM's intact. Fundi benign. Lungs: No evidence respiratory distress, no audible rales or ronchi and no use of accessory muscles of respiration Neck: no adenopathy, no carotid bruit, no JVD, supple, symmetrical, trachea midline and thyroid not enlarged, symmetric, no tenderness/mass/nodules Back: symmetric, no curvature. ROM normal. No CVA tenderness. Extremities: extremities normal, atraumatic, no cyanosis or edema Pulses: 2+ and symmetric Skin: Skin color, texture, turgor normal. No rashes or lesions Neurologic: Gait: Antalgic    Assessment: Encounter Diagnosis:  Primary Diagnosis: Failed back surgical syndrome [M53.9]  Plan: Kasey was seen today for back pain.  Diagnoses and all orders for this visit:  Failed back surgical syndrome Comments:  L4-5 right hemi-laminotomy. back surgery 2 by  Dr. Deri Fuelling.  Chronic pain -     oxyCODONE (OXY IR/ROXICODONE) 5 MG immediate release tablet; Take 1 tablet (5 mg total) by mouth every 6 (six) hours as needed for severe pain. -     oxyCODONE (OXY IR/ROXICODONE) 5 MG immediate release tablet; Take 1 tablet (5 mg total) by mouth every 6 (six) hours as needed for severe pain. -     oxyCODONE (OXY IR/ROXICODONE) 5 MG immediate release tablet; Take 1 tablet (5 mg total) by mouth every 6 (six) hours as needed for severe pain.  Chronic low back pain  Opiate use  Uncomplicated opioid dependence ([facility])  Long term current use of opiate analgesic  Long term prescription opiate use -     Drugs of abuse screen w/o alc, rtn urine-sln; Future  Encounter for therapeutic drug level monitoring  Epidural fibrosis   lumbar spondylosis with radiculopathy  Vertebral body hemangioma Comments:  L1, L2, and L4  Bulge of lumbar disc  without myelopathy Comments:  tiny central disc bulge at L3-4  Thoracic disc herniation Comments:  multilevel disc herniations at T6-7, T7-8, T8-9, and T9-10.   Abnormal CT myelogram of the thoracic spine Comments:  study done on 09/28/2011  Abnormal MRI, lumbar spine Comments:  MRI done and 04/24/2013  Chronic right sacroiliac joint pain     There are no Patient Instructions on file for this visit. Medications discontinued today:  Medications Discontinued During This Encounter  Medication Reason  . oxyCODONE (OXY IR/ROXICODONE) 5 MG immediate release tablet Reorder   Medications administered today:  Larry Goodwin had no medications administered during this visit.  Primary Care Physician: Ria Bush, MD Location: [facility] Note by: Hikari Tripp A. Dossie Arbour, M.D, DABA, DABAPM, DABPM, DABIPP, FIPP

## 2015-10-01 NOTE — Progress Notes (Signed)
10-01-15  UDS done

## 2015-10-01 NOTE — Progress Notes (Signed)
#  12 Oxycodone 5 mg remaining

## 2015-10-02 NOTE — Telephone Encounter (Signed)
Called patient, left a message to call back regarding a missed appt.

## 2015-10-03 NOTE — Telephone Encounter (Signed)
Pt stated he would call back tor/s

## 2015-10-07 ENCOUNTER — Encounter: Payer: Medicare Other | Admitting: Pain Medicine

## 2015-10-21 ENCOUNTER — Other Ambulatory Visit: Payer: Self-pay | Admitting: Pain Medicine

## 2015-11-18 ENCOUNTER — Other Ambulatory Visit: Payer: Self-pay | Admitting: Family Medicine

## 2015-11-18 ENCOUNTER — Encounter: Payer: Self-pay | Admitting: Family Medicine

## 2015-11-18 ENCOUNTER — Ambulatory Visit (INDEPENDENT_AMBULATORY_CARE_PROVIDER_SITE_OTHER): Payer: Medicare Other | Admitting: Family Medicine

## 2015-11-18 VITALS — BP 146/92 | HR 120 | Temp 98.2°F | Wt 339.5 lb

## 2015-11-18 DIAGNOSIS — J019 Acute sinusitis, unspecified: Secondary | ICD-10-CM | POA: Diagnosis not present

## 2015-11-18 MED ORDER — LISINOPRIL 20 MG PO TABS: 40.0000 mg | ORAL_TABLET | Freq: Every day | ORAL | Status: DC

## 2015-11-18 MED ORDER — AMOXICILLIN-POT CLAVULANATE 875-125 MG PO TABS: 1.0000 | ORAL_TABLET | Freq: Two times a day (BID) | ORAL | Status: DC

## 2015-11-18 NOTE — Patient Instructions (Signed)
Get some rest.  Drink plenty of fluids.  Start augmentin.  If your pulse isn't coming back down to normal (<100), then let us know.  Take care and glad to see you.

## 2015-11-18 NOTE — Assessment & Plan Note (Signed)
Augmentin, f/u prn.  PO fluids in meantime, rest.  D/w pt re: pulse.  Likely from back pain and illness but doesn't appear dehydrated.   Still okay for outpatient f/u.  Update [place] as needed.  He agrees.

## 2015-11-18 NOTE — Telephone Encounter (Signed)
Patient has to be seen for abx per policy. Called patient and scheduled appt for this afternoon with Dr. Damita Dunnings.

## 2015-11-18 NOTE — Telephone Encounter (Signed)
Pt left v/m requesting refill abx for sinus infection to [facility] [address]. Spoke with pt having sinus infection; pt gets at least 3 - 4 times per year, head congestion, sinus is feeling dry and h/a, slight S/T. No fever. Pt cannot come in for appt and hopes can get abx.last seen 08/13/15.pt request cb.

## 2015-11-18 NOTE — Progress Notes (Signed)
Pre visit review using our clinic review tool, if applicable. No additional management support is needed unless otherwise documented below in the visit note.  Sick for the last 1-2 weeks.  Facial pain, stuffy.  No fevers.  No cough.  No chest sx.  Some post nasal gtt.  Rhinorrhea.  Not dizzy.  Still taking fluids well, a lot of water.   Meds, vitals, and allergies reviewed.   ROS: See HPI.  Otherwise, noncontributory.  GEN: nad, alert and oriented HEENT: mucous membranes moist, tm w/o erythema, nasal exam w/o erythema, clear discharge noted,  OP with cobblestoning, max sinuses ttp.   NECK: supple w/o LA CV: rrr.   PULM: ctab, no inc wob

## 2015-12-12 ENCOUNTER — Other Ambulatory Visit: Payer: Self-pay | Admitting: Family Medicine

## 2015-12-12 NOTE — Telephone Encounter (Signed)
Left detailed message on home voicemail to get more details as to whether he got better after the first round of ABX and it came back or if it never got better.

## 2015-12-13 NOTE — Telephone Encounter (Signed)
Sent. Thanks.  F/u if not better.  

## 2015-12-13 NOTE — Telephone Encounter (Signed)
Please see what details you can get and then notify me. Routed to PCP as FYI.  Thanks.

## 2015-12-13 NOTE — Telephone Encounter (Signed)
Patient advised.

## 2015-12-13 NOTE — Telephone Encounter (Signed)
Pt request refill abx to [facility];pt last seen 11/18/15; symptoms got better but never went away;5 days ago pt began to feel worse again. Today a lot of head congestion and when blows nose has thick green mucus,facial pain around eyes and cheeks, slight S/T. No fever,cough, H/A, dizziness, or earache. Pt request refill abx.

## 2015-12-30 ENCOUNTER — Encounter: Payer: Self-pay | Admitting: Pain Medicine

## 2015-12-30 ENCOUNTER — Ambulatory Visit: Payer: Medicare Other | Attending: Pain Medicine | Admitting: Pain Medicine

## 2015-12-30 ENCOUNTER — Other Ambulatory Visit: Payer: Self-pay | Admitting: Pain Medicine

## 2015-12-30 VITALS — HR 115 | Temp 98.1°F | Resp 16 | Ht 74.0 in | Wt 320.0 lb

## 2015-12-30 DIAGNOSIS — F329 Major depressive disorder, single episode, unspecified: Secondary | ICD-10-CM | POA: Insufficient documentation

## 2015-12-30 DIAGNOSIS — Z79891 Long term (current) use of opiate analgesic: Secondary | ICD-10-CM

## 2015-12-30 DIAGNOSIS — I10 Essential (primary) hypertension: Secondary | ICD-10-CM | POA: Insufficient documentation

## 2015-12-30 DIAGNOSIS — J019 Acute sinusitis, unspecified: Secondary | ICD-10-CM | POA: Diagnosis not present

## 2015-12-30 DIAGNOSIS — G510 Bell's palsy: Secondary | ICD-10-CM | POA: Insufficient documentation

## 2015-12-30 DIAGNOSIS — M5136 Other intervertebral disc degeneration, lumbar region: Secondary | ICD-10-CM | POA: Diagnosis not present

## 2015-12-30 DIAGNOSIS — G8929 Other chronic pain: Secondary | ICD-10-CM | POA: Diagnosis not present

## 2015-12-30 DIAGNOSIS — F1190 Opioid use, unspecified, uncomplicated: Secondary | ICD-10-CM | POA: Diagnosis not present

## 2015-12-30 DIAGNOSIS — M549 Dorsalgia, unspecified: Secondary | ICD-10-CM | POA: Insufficient documentation

## 2015-12-30 DIAGNOSIS — Z5181 Encounter for therapeutic drug level monitoring: Secondary | ICD-10-CM | POA: Diagnosis not present

## 2015-12-30 DIAGNOSIS — R790 Abnormal level of blood mineral: Secondary | ICD-10-CM

## 2015-12-30 DIAGNOSIS — Z87891 Personal history of nicotine dependence: Secondary | ICD-10-CM | POA: Diagnosis not present

## 2015-12-30 DIAGNOSIS — E559 Vitamin D deficiency, unspecified: Secondary | ICD-10-CM | POA: Insufficient documentation

## 2015-12-30 DIAGNOSIS — M545 Low back pain: Secondary | ICD-10-CM

## 2015-12-30 MED ORDER — OXYCODONE HCL 5 MG PO TABS: 5.0000 mg | ORAL_TABLET | Freq: Four times a day (QID) | ORAL | Status: DC | PRN

## 2015-12-30 MED ORDER — VITAMIN D3 50 MCG (2000 UT) PO CAPS: 2000.0000 [IU] | ORAL_CAPSULE | Freq: Every day | ORAL | Status: DC

## 2015-12-30 MED ORDER — VITAMIN D (ERGOCALCIFEROL) 1.25 MG (50000 UNIT) PO CAPS: 50000.0000 [IU] | ORAL_CAPSULE | ORAL | Status: AC

## 2015-12-30 MED ORDER — MAGNESIUM OXIDE -MG SUPPLEMENT 500 MG PO CAPS: 1.0000 | ORAL_CAPSULE | Freq: Every day | ORAL | Status: DC

## 2015-12-30 NOTE — Progress Notes (Signed)
Patient here for medication refill.    Oxycodone HCL 5 mg qty 6 last refill [id number]

## 2015-12-30 NOTE — Progress Notes (Signed)
Patient's Name: Larry Goodwin MRN: [id number] DOB: 1976/09/02 DOS: 12/30/2015  Primary Reason(s) for Visit: Encounter for Medication Management CC: Back Pain   HPI:  Larry Goodwin is a 40 y.o. year old, male patient, who returns today as an established patient. He has Acute sinusitis; DDD (degenerative disc disease), lumbar; HTN (hypertension); Bell's palsy; Severe obesity (BMI >= 40) ([place]); Chronic pain syndrome; Medicare annual wellness visit, initial; Hyperglycemia; History of tobacco abuse; Low magnesium levels; Vitamin D deficiency; Myofascial pain syndrome; Facet syndrome, lumbar; Chronic upper back pain; Sacroiliac joint pain; Generalized anxiety disorder; Depression;  lumbar spondylosis with radiculopathy; Epidural fibrosis; Failed back surgical syndrome; Encounter for therapeutic drug level monitoring; Long term prescription opiate use; Long term current use of opiate analgesic; Uncomplicated opioid dependence ([place]); Opiate use; Chronic low back pain; Chronic pain; Chronic right sacroiliac joint pain; Abnormal MRI, lumbar spine;  Abnormal CT myelogram of the thoracic spine; Thoracic disc herniation; Bulge of lumbar disc without myelopathy; and Vertebral body hemangioma on his problem list.. His primarily concern today is the Back Pain   The patient comes in today clinics today for pharmacological management of his chronic pain. He describes his pain to be in the center of his lower back but he seems to be doing much better since he has lost weight. His magnesium levels and his vitamin D levels were low and therefore we have prescribed some medication for that.  Reported Pain Score: 5  (last pain this a.m. @ 0600), clinically he looks like a 1-2/10. Reported level is inconsistent with clinical obrservations. Pain Type: Chronic pain Pain Location: Back Pain Orientation: Other (Comment) (entire back) Pain Descriptors / Indicators: Dull, Constant Pain Frequency: Constant  Date of Last  Visit: 09/30/16 Service Provided on Last Visit: Med Refill  Pharmacotherapy  Review:   Onset of action: Within expected pharmacological parameters Time to Peak effect: Timing and results are as within normal expected parameters Effectiveness: Described as relatively effective, allowing for increase in activities of daily living (ADL) % Relief: More than 50% Side-effects or Adverse reactions: None reported Duration of action: Within normal limits for medication [place] PMP: Compliant with practice rules and regulations UDS Results: Compliant UDS Interpretation: Patient appears to be compliant with practice rules and regulations Medication Assessment Form: Reviewed. Patient indicates being compliant with therapy Treatment compliance: Compliant Substance Use Disorder (SUD) Risk Level: Low Pharmacologic Plan: Continue therapy as is  Lab Work: Illicit Drugs No results found for: THCU, COCAINSCRNUR, PCPSCRNUR, MDMA, AMPHETMU, METHADONE, ETOH  Inflammation Markers Lab Results  Component Value Date   ESRSEDRATE 6 07/05/2015    Renal Function Lab Results  Component Value Date   BUN 11 07/05/2015   CREATININE 0.82 07/05/2015   GFRAA >60 07/05/2015   GFRNONAA >60 07/05/2015    Hepatic Function Lab Results  Component Value Date   AST 22 07/05/2015   ALT 29 07/05/2015   ALBUMIN 4.2 07/05/2015    Electrolytes Lab Results  Component Value Date   NA 138 07/05/2015   K 4.1 07/05/2015   CL 102 07/05/2015   CALCIUM 8.6* 07/05/2015   MG 1.8 07/05/2015    Allergies:  Larry Goodwin is allergic to levaquin.  Meds:  The patient has a current medication list which includes the following prescription(s): lisinopril, oxycodone, vitamin d3, magnesium oxide, oxycodone, oxycodone, and vitamin d (ergocalciferol).  ROS:  Constitutional: Afebrile, no chills, well hydrated and well nourished Gastrointestinal: negative Musculoskeletal:negative Neurological: negative Behavioral/Psych:  negative  PFSH:  Medical:  Larry Goodwin  has a past medical history of DDD (degenerative disc disease), lumbar; HTN (hypertension); Bell's palsy (2015); Morbid obesity ([place]) (08/16/2014); and Chronic pain syndrome. Family: family history includes COPD in his mother; Hypertension in his other. There is no history of Stroke, Cancer, Diabetes, or CAD. Surgical:  has past surgical history that includes Back surgery (2012) and Cholecystectomy. Tobacco:  reports that he quit smoking about 8 years ago. He has never used smokeless tobacco. Alcohol:  reports that he does not drink alcohol. Drug:  reports that he does not use illicit drugs.  Physical Exam:  Vitals:  Today's Vitals   12/30/15 0908  Pulse: 115  Temp: 98.1 F (36.7 C)  TempSrc: Oral  Resp: 16  Height: 6\' 2"  (1.88 m)  Weight: 320 lb (145.151 kg)  SpO2: 98%  PainSc: 5     Calculated BMI: Body mass index is 41.07 kg/(m^2).  General appearance: alert, cooperative, appears stated age, no distress and moderately obese Eyes: PERLA Respiratory: No evidence respiratory distress, no audible rales or ronchi and no use of accessory muscles of respiration  Cervical Spine Inspection: Normal anatomy Alignment: Symetrical Palpation: WNL ROM: Adequate  Upper Extremities Inspection: No gross anomalies detected ROM: Adequate Sensory: Normal Motor: 5/5 Pulses: Palpable  Thoracic Spine Inspection: No gross anomalies detected Alignment: Symetrical Palpation: WNL ROM: Adequate  Lumbar Spine Inspection: No gross anomalies detected Alignment: Symetrical Palpation: WNL ROM: Adequate Gait: WNL  Lower Extremities Inspection: No gross anomalies detected ROM: Adequate Sensory: Normal Motor: 5/5  Toe walk (S1): WNL  Heal walk (L5): WNL Pulses: Palpable  Assessment & Plan:  Primary Diagnosis & Pertinent Problem List: The primary encounter diagnosis was Chronic pain. Diagnoses of Long term current use of opiate analgesic,  Encounter for therapeutic drug level monitoring, Chronic low back pain, Vitamin D deficiency, and Low magnesium levels were also pertinent to this visit.  Assessment: No problem-specific assessment & plan notes found for this encounter.   Pharmacotherapy Orders: Meds ordered this encounter  Medications  . oxyCODONE (OXY IR/ROXICODONE) 5 MG immediate release tablet    Sig: Take 1 tablet (5 mg total) by mouth every 6 (six) hours as needed for severe pain.    Dispense:  120 tablet    Refill:  0    Do not place this medication, or any other prescription from our practice, on "Automatic Refill". Patient may have prescription filled one day early if pharmacy is closed on scheduled refill date. Do not fill until: 12/30/15 To last until: 01/29/16  . oxyCODONE (OXY IR/ROXICODONE) 5 MG immediate release tablet    Sig: Take 1 tablet (5 mg total) by mouth every 6 (six) hours as needed for moderate pain or severe pain.    Dispense:  120 tablet    Refill:  0    Do not place this medication, or any other prescription from our practice, on "Automatic Refill". Patient may have prescription filled one day early if pharmacy is closed on scheduled refill date. Do not fill until: 01/29/16 To last until: 02/25/16  . oxyCODONE (OXY IR/ROXICODONE) 5 MG immediate release tablet    Sig: Take 1 tablet (5 mg total) by mouth every 6 (six) hours as needed for moderate pain or severe pain.    Dispense:  120 tablet    Refill:  0    Do not place this medication, or any other prescription from our practice, on "Automatic Refill". Patient may have prescription filled one day early if pharmacy is closed on scheduled refill date.  Do not fill until: 02/25/16 To last until: 03/26/16  . Cholecalciferol (VITAMIN D3) 2000 units capsule    Sig: Take 1 capsule (2,000 Units total) by mouth daily.    Dispense:  30 capsule    Refill:  PRN    Do not place this medication, or any other prescription from our practice, on "Automatic  Refill".  . Vitamin D, Ergocalciferol, (DRISDOL) 50000 units CAPS capsule    Sig: Take 1 capsule (50,000 Units total) by mouth 2 (two) times a week. X 6 weeks.    Dispense:  12 capsule    Refill:  0    Do not place this medication, or any other prescription from our practice, on "Automatic Refill".  . Magnesium Oxide 500 MG CAPS    Sig: Take 1 capsule (500 mg total) by mouth daily.    Dispense:  100 capsule    Refill:  PRN    Do not place this medication, or any other prescription from our practice, on "Automatic Refill". Patient may have prescription filled one day early if pharmacy is closed on scheduled refill date.   Labwork Ordered: Orders Placed This Encounter  Procedures  . Drugs of abuse screen w/o alc, rtn urine-sln  . Magnesium  . C-reactive protein  . Comprehensive metabolic panel  . Sedimentation rate  . Vitamin B12  . Vitamin D pnl(25-hydrxy+1,25-dihy)-bld    Radiology Orders: None  Interventional Therapies: None at this time.    Administered Medications: Larry Goodwin had no medications administered during this visit.  Primary Care Physician: Ria Bush, MD Location: [facility] Facility Note by: Amberlynn Tempesta A. Dossie Arbour, M.D, DABA, DABAPM, DABPM, DABIPP, FIPP

## 2016-01-04 LAB — TOXASSURE SELECT 13 (MW), URINE: PDF: 0

## 2016-01-10 ENCOUNTER — Other Ambulatory Visit: Payer: Self-pay

## 2016-01-10 MED ORDER — LISINOPRIL 20 MG PO TABS: 20.0000 mg | ORAL_TABLET | Freq: Two times a day (BID) | ORAL | Status: DC

## 2016-01-10 MED ORDER — LISINOPRIL 40 MG PO TABS: 40.0000 mg | ORAL_TABLET | Freq: Every day | ORAL | Status: DC

## 2016-01-10 NOTE — Telephone Encounter (Signed)
Med sent in. Needs f/u in office - plz schedule.

## 2016-01-10 NOTE — Telephone Encounter (Signed)
Pt called requesting status of medication. He said he really needs this med as he is completely out

## 2016-01-10 NOTE — Telephone Encounter (Signed)
Pt left v/m requesting refill lisinopril to [facility]. Pt will be out of med 01/11/16. Last refilled# 60 x 3 on 08/13/15. When seen 08/13/15 pt was to return in 1 month to recheck BP; 09/13/15 pt was no show.No future appt scheduled.Please advise.

## 2016-01-15 NOTE — Telephone Encounter (Signed)
Follow up scheduled

## 2016-02-04 ENCOUNTER — Telehealth: Payer: Self-pay | Admitting: Family Medicine

## 2016-02-04 ENCOUNTER — Ambulatory Visit: Payer: Medicare Other | Admitting: Family Medicine

## 2016-02-04 NOTE — Telephone Encounter (Signed)
No need to f/u. Due for medicare wellness visit

## 2016-02-04 NOTE — Telephone Encounter (Signed)
Patient did not come for their scheduled appointment today for follow up Please let me know if the patient needs to be contacted immediately for follow up or if no follow up is necessary.   ° °

## 2016-02-10 ENCOUNTER — Other Ambulatory Visit: Payer: Self-pay | Admitting: Pain Medicine

## 2016-02-28 ENCOUNTER — Other Ambulatory Visit: Payer: Self-pay | Admitting: Family Medicine

## 2016-03-25 ENCOUNTER — Other Ambulatory Visit: Payer: Self-pay | Admitting: Pain Medicine

## 2016-03-25 ENCOUNTER — Ambulatory Visit: Payer: Medicare Other | Attending: Pain Medicine | Admitting: Pain Medicine

## 2016-03-25 ENCOUNTER — Encounter: Payer: Self-pay | Admitting: Pain Medicine

## 2016-03-25 VITALS — BP 137/85 | HR 89 | Temp 98.2°F | Resp 16 | Ht 74.0 in | Wt 303.0 lb

## 2016-03-25 DIAGNOSIS — M533 Sacrococcygeal disorders, not elsewhere classified: Secondary | ICD-10-CM | POA: Insufficient documentation

## 2016-03-25 DIAGNOSIS — M546 Pain in thoracic spine: Secondary | ICD-10-CM | POA: Diagnosis not present

## 2016-03-25 DIAGNOSIS — M549 Dorsalgia, unspecified: Secondary | ICD-10-CM | POA: Diagnosis present

## 2016-03-25 DIAGNOSIS — M5136 Other intervertebral disc degeneration, lumbar region: Secondary | ICD-10-CM | POA: Diagnosis not present

## 2016-03-25 DIAGNOSIS — F1190 Opioid use, unspecified, uncomplicated: Secondary | ICD-10-CM | POA: Diagnosis not present

## 2016-03-25 DIAGNOSIS — Z9049 Acquired absence of other specified parts of digestive tract: Secondary | ICD-10-CM | POA: Diagnosis not present

## 2016-03-25 DIAGNOSIS — F411 Generalized anxiety disorder: Secondary | ICD-10-CM | POA: Insufficient documentation

## 2016-03-25 DIAGNOSIS — M5126 Other intervertebral disc displacement, lumbar region: Secondary | ICD-10-CM | POA: Insufficient documentation

## 2016-03-25 DIAGNOSIS — E559 Vitamin D deficiency, unspecified: Secondary | ICD-10-CM | POA: Diagnosis not present

## 2016-03-25 DIAGNOSIS — Z79891 Long term (current) use of opiate analgesic: Secondary | ICD-10-CM | POA: Diagnosis not present

## 2016-03-25 DIAGNOSIS — G510 Bell's palsy: Secondary | ICD-10-CM | POA: Diagnosis not present

## 2016-03-25 DIAGNOSIS — R739 Hyperglycemia, unspecified: Secondary | ICD-10-CM | POA: Diagnosis not present

## 2016-03-25 DIAGNOSIS — M47896 Other spondylosis, lumbar region: Secondary | ICD-10-CM | POA: Insufficient documentation

## 2016-03-25 DIAGNOSIS — Z5181 Encounter for therapeutic drug level monitoring: Secondary | ICD-10-CM | POA: Diagnosis not present

## 2016-03-25 DIAGNOSIS — G8929 Other chronic pain: Secondary | ICD-10-CM | POA: Diagnosis not present

## 2016-03-25 DIAGNOSIS — Z6841 Body Mass Index (BMI) 40.0 and over, adult: Secondary | ICD-10-CM | POA: Insufficient documentation

## 2016-03-25 DIAGNOSIS — M545 Low back pain: Secondary | ICD-10-CM | POA: Diagnosis not present

## 2016-03-25 DIAGNOSIS — F329 Major depressive disorder, single episode, unspecified: Secondary | ICD-10-CM | POA: Insufficient documentation

## 2016-03-25 DIAGNOSIS — I10 Essential (primary) hypertension: Secondary | ICD-10-CM | POA: Insufficient documentation

## 2016-03-25 DIAGNOSIS — Z87891 Personal history of nicotine dependence: Secondary | ICD-10-CM | POA: Diagnosis not present

## 2016-03-25 DIAGNOSIS — M47816 Spondylosis without myelopathy or radiculopathy, lumbar region: Secondary | ICD-10-CM

## 2016-03-25 DIAGNOSIS — R790 Abnormal level of blood mineral: Secondary | ICD-10-CM

## 2016-03-25 MED ORDER — OXYCODONE HCL 5 MG PO TABS: 5.0000 mg | ORAL_TABLET | Freq: Four times a day (QID) | ORAL | Status: DC | PRN

## 2016-03-25 MED ORDER — MAGNESIUM OXIDE -MG SUPPLEMENT 500 MG PO CAPS: 1.0000 | ORAL_CAPSULE | Freq: Every day | ORAL | Status: DC

## 2016-03-25 NOTE — Progress Notes (Signed)
Safety precautions to be maintained throughout the outpatient stay will include: orient to surroundings, keep bed in low position, maintain call bell within reach at all times, provide assistance with transfer out of bed and ambulation.  

## 2016-03-25 NOTE — Progress Notes (Signed)
Patient's Name: Larry Goodwin  Patient type: Established  MRN: [id number]  Service setting: Ambulatory outpatient  DOB: 1976-02-12  Location: [facility]  DOS: 03/25/2016  Primary Care Physician: Larry Bush, MD  Note by: Kathlen Brunswick. Dossie Arbour, M.D, DABA, DABAPM, DABPM, Milagros Evener, [place]  Referring Physician: Ria Bush, MD  Specialty: Board-Certified Interventional Pain Management     Primary Reason(s) for Visit: Encounter for prescription drug management (Level of risk: moderate) CC: Back Pain   HPI  Larry Goodwin is a 40 y.o. year old, male patient, who returns today as an established patient. He has DDD (degenerative disc disease), lumbar; HTN (hypertension); Bell's palsy; Severe obesity (BMI >= 40) (Larry Goodwin); Chronic pain syndrome; Medicare annual wellness visit, initial; Hyperglycemia; History of tobacco abuse; Low magnesium levels; Vitamin D deficiency; Myofascial pain syndrome; Facet syndrome, lumbar; Chronic upper back pain; Sacroiliac joint pain; Generalized anxiety disorder; Depression;  lumbar spondylosis with radiculopathy; Epidural fibrosis; Failed back surgical syndrome; Encounter for therapeutic drug level monitoring; Long term prescription opiate use; Long term current use of opiate analgesic; Opiate use (30 MME/Day); Chronic low back pain (R>L); Chronic pain; Chronic right sacroiliac joint pain; Abnormal MRI, lumbar spine;  Abnormal CT myelogram of the thoracic spine; Thoracic disc herniation; Bulge of lumbar disc without myelopathy; and Vertebral body hemangioma on his problem list.. His primarily concern today is the Back Pain   Pain Assessment: Self-Reported Pain Score: 5  Reported level is compatible with observation Pain Type: Chronic pain Pain Descriptors / Indicators: Constant Pain Frequency: Constant  The patient comes in today clinics today for pharmacological management of his chronic pain.  Date of Last Visit: 12/30/15 Service  Provided on Last Visit: Med Refill  Controlled Substance Pharmacotherapy Assessment  Analgesic: Oxycodone IR 5 mg every 6 hours (4 per day) (20 mg/day) Pill Count: #6 out of 120 Oxycodone 5mg  remaining. Filled on 02-26-16 MME/day: 30 mg/day.  Pharmacokinetics: Onset of action (Liberation/Absorption): Within expected pharmacological parameters Time to Peak effect (Distribution): Timing and results are as within normal expected parameters Duration of action (Metabolism/Excretion): Within normal limits for medication Pharmacodynamics: Analgesic Effect: More than 50% Activity Facilitation: Medication(s) allow patient to sit, stand, walk, and do the basic ADLs Perceived Effectiveness: Described as relatively effective, allowing for increase in activities of daily living (ADL) Side-effects or Adverse reactions: None reported Monitoring: Larry Goodwin PMP: Online review of the past 89-month period conducted. Compliant with practice rules and regulations UDS Results/interpretation: The patient's last UDS was done on 12/30/2015 and it came back within normal limits with no unexpected results. Medication Assessment Form: Reviewed. Patient indicates being compliant with therapy Treatment compliance: Compliant Risk Assessment: Aberrant Behavior: claims that "nothing else works" Substance Use Disorder (SUD) Risk Level: Low Risk of opioid abuse or dependence: 0.7-3.0% with doses ? 36 MME/day and 6.1-26% with doses ? 120 MME/day. Opioid Risk Tool (ORT) Score: Total Score: 1 Low Risk for SUD (Score <3) Depression Scale Score: PHQ-2: PHQ-2 Total Score: 0 No depression (0) PHQ-9: PHQ-9 Total Score: 0 No depression (0-4)  Pharmacologic Plan: No change in therapy, at this time  Laboratory Chemistry  Inflammation Markers Lab Results  Component Value Date   ESRSEDRATE 6 07/05/2015    Renal Function Lab Results  Component Value Date   BUN 11 07/05/2015   CREATININE 0.82 07/05/2015   GFRAA >60 07/05/2015    GFRNONAA >60 07/05/2015    Hepatic Function Lab Results  Component Value Date   AST 22 07/05/2015   ALT 29 07/05/2015  ALBUMIN 4.2 07/05/2015    Electrolytes Lab Results  Component Value Date   NA 138 07/05/2015   K 4.1 07/05/2015   CL 102 07/05/2015   CALCIUM 8.6* 07/05/2015   MG 1.8 07/05/2015    Pain Modulating Vitamins No results found for: [place], VD125OH2TOT, [id number], [id number], VITAMINB12  Coagulation Parameters No results found for: INR, LABPROT  Note: I personally reviewed the above data. Results shared with patient.  Meds  The patient has a current medication list which includes the following prescription(s): vitamin d3, lisinopril, magnesium oxide, oxycodone, oxycodone, and oxycodone.  Current Outpatient Prescriptions on File Prior to Visit  Medication Sig  . Cholecalciferol (VITAMIN D3) 2000 units capsule Take 1 capsule (2,000 Units total) by mouth daily.  Marland Kitchen lisinopril (PRINIVIL,ZESTRIL) 40 MG tablet TAKE 1 TABLET (40 MG TOTAL) BY MOUTH DAILY.   No current facility-administered medications on file prior to visit.    ROS  Constitutional: Afebrile, no chills, well hydrated and well nourished Gastrointestinal: No upper or lower GI bleeding, no nausea, no vomiting and no acute GI distress Musculoskeletal: No acute joint swelling or redness, no acute loss of range of motion and no acute onset weakness Neurological: Denies any acute onset apraxia, no episodes of paralysis, no acute loss of coordination, no acute loss of consciousness and no acute onset aphasia, dysarthria, agnosia, or amnesia  Allergies  Larry Goodwin is allergic to levaquin.  [place]  Medical:  Larry Goodwin  has a past medical history of DDD (degenerative disc disease), lumbar; HTN (hypertension); Bell's palsy (2015); Morbid obesity ([place]) (08/16/2014); and Chronic pain syndrome. Family: family history includes COPD in his mother; Hypertension in his other. There is no history of Stroke, Cancer,  Diabetes, or CAD. Surgical:  has past surgical history that includes Back surgery (2012) and Cholecystectomy. Tobacco:  reports that he quit smoking about 8 years ago. He has never used smokeless tobacco. Alcohol:  reports that he does not drink alcohol. Drug:  reports that he does not use illicit drugs.  Physical Examination  Constitutional Vitals:  Today's Vitals   03/25/16 0920 03/25/16 0927  BP: 137/85   Pulse: 89   Temp: 98.2 F (36.8 C)   TempSrc: Oral   Resp: 16   Height: 6\' 2"  (1.88 m)   Weight: 303 lb (137.44 kg)   SpO2: 100%   PainSc:  5    Calculated BMI: Body mass index is 38.89 kg/(m^2). Severe obesity (Class II) (35-39.9 kg/m2) - 136% higher incidence of chronic pain General appearance: alert, cooperative, appears stated age, no distress and moderately obese Eyes: PERLA Respiratory: No evidence respiratory distress, no audible rales or ronchi and no use of accessory muscles of respiration  Cervical Spine Exam  Inspection: Normal anatomy, no anomalies observed Cervical Lordosis: Normal Alignment: Symetrical Functional ROM: Within functional limits (WFL) AROM: WFL Sensory: No sensory abnormalities reported  Upper Extremity Exam    Right  Left  Inspection: No gross anomalies detected  Inspection: No gross anomalies detected  Functional ROM: Adequate  Functional ROM: Adequate  AROM: Adequate  AROM: Adequate  Sensory: Normal  No sensory abnormalities reported  Sensory: Normal  No sensory abnormalities reported  Motor: Unremarkable  Motor: Unremarkable  Vascular: Normal skin color, temperature, and hair growth. No peripheral edema or cyanosis  Vascular: Normal skin color, temperature, and hair growth. No peripheral edema or cyanosis   Thoracic Spine  Inspection: No gross anomalies detected Alignment: Symetrical Functional ROM: Within functional limits [facility]) AROM: Adequate Palpation: WNL  Lumbar  Spine  Inspection: No gross anomalies detected Alignment:  Symetrical Functional ROM: Within functional limits [facility]) AROM: Decreased Palpation: Tender Provocative Tests: Lumbar Hyperextension and rotation test: Positive bilaterally for pain arising from the facet joints. Patrick's Maneuver: deferred  Gait Assessment  Gait: WNL  Lower Extremities    Right  Left  Inspection: No gross anomalies detected  Inspection: No gross anomalies detected  Functional ROM: Within functional limits [facility])  Functional ROM: Within functional limits (WFL)  AROM: Adequate  AROM: Adequate  Sensory: Normal  Sensory: Normal  Motor: Unremarkable  Motor: Unremarkable  Toe walk (S1): WNL  Toe walk (S1): WNL  Heal walk (L5): WNL  Heal walk (L5): WNL   Assessment & Plan  Primary Diagnosis & Pertinent Problem List: The primary encounter diagnosis was Chronic pain. Diagnoses of Encounter for therapeutic drug level monitoring, Long term current use of opiate analgesic, Opiate use, Vitamin D deficiency, Chronic low back pain (R>L), Low magnesium levels, and Facet syndrome, lumbar were also pertinent to this visit.  Visit Diagnosis: 1. Chronic pain   2. Encounter for therapeutic drug level monitoring   3. Long term current use of opiate analgesic   4. Opiate use   5. Vitamin D deficiency   6. Chronic low back pain (R>L)   7. Low magnesium levels   8. Facet syndrome, lumbar     Problem-specific Plan(s): No problem-specific assessment & plan notes found for this encounter.   Plan of Care   Problem List Items Addressed This Visit      High   Chronic low back pain (R>L) (Chronic)   Relevant Medications   oxyCODONE (OXY IR/ROXICODONE) 5 MG immediate release tablet   oxyCODONE (OXY IR/ROXICODONE) 5 MG immediate release tablet   oxyCODONE (OXY IR/ROXICODONE) 5 MG immediate release tablet   Chronic pain - Primary (Chronic)   Relevant Medications   oxyCODONE (OXY IR/ROXICODONE) 5 MG immediate release tablet   oxyCODONE (OXY IR/ROXICODONE) 5 MG immediate release  tablet   oxyCODONE (OXY IR/ROXICODONE) 5 MG immediate release tablet   Facet syndrome, lumbar (Chronic)   Relevant Medications   oxyCODONE (OXY IR/ROXICODONE) 5 MG immediate release tablet   oxyCODONE (OXY IR/ROXICODONE) 5 MG immediate release tablet   oxyCODONE (OXY IR/ROXICODONE) 5 MG immediate release tablet   Other Relevant Orders   LUMBAR FACET(MEDIAL BRANCH NERVE BLOCK) MBNB     Medium   Encounter for therapeutic drug level monitoring   Long term current use of opiate analgesic (Chronic)   Relevant Orders   ToxASSURE Select 13 (MW), Urine   Opiate use (30 MME/Day) (Chronic)     Low   Low magnesium levels   Relevant Medications   Magnesium Oxide 500 MG CAPS   Vitamin D deficiency       Pharmacotherapy (Medications Ordered): Meds ordered this encounter  Medications  . oxyCODONE (OXY IR/ROXICODONE) 5 MG immediate release tablet    Sig: Take 1 tablet (5 mg total) by mouth every 6 (six) hours as needed for severe pain.    Dispense:  120 tablet    Refill:  0    Do not place this medication, or any other prescription from our practice, on "Automatic Refill". Patient may have prescription filled one day early if pharmacy is closed on scheduled refill date. Do not fill until: 03/26/16 To last until: 04/25/16  . oxyCODONE (OXY IR/ROXICODONE) 5 MG immediate release tablet    Sig: Take 1 tablet (5 mg total) by mouth every 6 (six) hours as needed  for moderate pain or severe pain.    Dispense:  120 tablet    Refill:  0    Do not place this medication, or any other prescription from our practice, on "Automatic Refill". Patient may have prescription filled one day early if pharmacy is closed on scheduled refill date. Do not fill until: 04/25/16 To last until: 05/25/16  . oxyCODONE (OXY IR/ROXICODONE) 5 MG immediate release tablet    Sig: Take 1 tablet (5 mg total) by mouth every 6 (six) hours as needed for moderate pain or severe pain.    Dispense:  120 tablet    Refill:  0    Do  not place this medication, or any other prescription from our practice, on "Automatic Refill". Patient may have prescription filled one day early if pharmacy is closed on scheduled refill date. Do not fill until: 05/25/16 To last until: 06/24/16  . Magnesium Oxide 500 MG CAPS    Sig: Take 1 capsule (500 mg total) by mouth daily.    Dispense:  100 capsule    Refill:  2    Do not place this medication, or any other prescription from our practice, on "Automatic Refill". Patient may have prescription filled one day early if pharmacy is closed on scheduled refill date.   Lab-work & Procedure Ordered: Orders Placed This Encounter  Procedures  . LUMBAR FACET(MEDIAL BRANCH NERVE BLOCK) MBNB  . ToxASSURE Select 13 (MW), Urine    Imaging Ordered: None  Interventional Therapies: Scheduled:  None at this time.    Considering:  Diagnostic bilateral lumbar facet block under fluoroscopic guidance and IV sedation. If effective, we will consider radiofrequency ablation.    PRN Procedures:  Diagnostic bilateral lumbar facet block under fluoroscopic guidance and IV sedation.    Referral(s) or Consult(s): None at this time.  New Prescriptions   No medications on file    Medications administered during this visit: Mr. Rodick had no medications administered during this visit.  Future Appointments Date Time Provider [place]  06/24/2016 9:20 AM Milinda Pointer, MD [facility] None    Primary Care Physician: Larry Bush, MD Location: [facility] Note by: Kathlen Brunswick. Dossie Arbour, M.D, DABA, DABAPM, DABPM, DABIPP, FIPP  Pain Score Disclaimer: We use the NRS-11 scale. This is a self-reported, subjective measurement of pain severity with only modest accuracy. It is used primarily to identify changes within a particular patient. It must be understood that outpatient pain scales are significantly less accurate that those used for research, where they can be  applied under ideal controlled circumstances with minimal exposure to variables. In reality, the score is likely to be a combination of pain intensity and pain affect, where pain affect describes the degree of emotional arousal or changes in action readiness caused by the sensory experience of pain. Factors such as social and work situation, setting, emotional state, anxiety levels, expectation, and prior pain experience may influence pain perception and show large inter-individual differences that may also be affected by time variables.

## 2016-03-25 NOTE — Patient Instructions (Signed)
You were given 3 prescriptiosf for Oxycodone today.  Facet Blocks Patient Information  Description: The facets are joints in the spine between the vertebrae.  Like any joints in the body, facets can become irritated and painful.  Arthritis can also effect the facets.  By injecting steroids and local anesthetic in and around these joints, we can temporarily block the nerve supply to them.  Steroids act directly on irritated nerves and tissues to reduce selling and inflammation which often leads to decreased pain.  Facet blocks may be done anywhere along the spine from the neck to the low back depending upon the location of your pain.   After numbing the skin with local anesthetic (like Novocaine), a small needle is passed onto the facet joints under x-ray guidance.  You may experience a sensation of pressure while this is being done.  The entire block usually lasts about 15-25 minutes.   Conditions which may be treated by facet blocks:   Low back/buttock pain  Neck/shoulder pain  Certain types of headaches  Preparation for the injection:  1. Do not eat any solid food or dairy products within 8 hours of your appointment. 2. You may drink clear liquid up to 3 hours before appointment.  Clear liquids include water, black coffee, juice or soda.  No milk or cream please. 3. You may take your regular medication, including pain medications, with a sip of water before your appointment.  Diabetics should hold regular insulin (if taken separately) and take 1/2 normal NPH dose the morning of the procedure.  Carry some sugar containing items with you to your appointment. 4. A driver must accompany you and be prepared to drive you home after your procedure. 5. Bring all your current medications with you. 6. An IV may be inserted and sedation may be given at the discretion of the physician. 7. A blood pressure cuff, EKG and other monitors will often be applied during the procedure.  Some patients may need  to have extra oxygen administered for a short period. 8. You will be asked to provide medical information, including your allergies and medications, prior to the procedure.  We must know immediately if you are taking blood thinners (like Coumadin/Warfarin) or if you are allergic to IV iodine contrast (dye).  We must know if you could possible be pregnant.  Possible side-effects:   Bleeding from needle site  Infection (rare, may require surgery)  Nerve injury (rare)  Numbness & tingling (temporary)  Difficulty urinating (rare, temporary)  Spinal headache (a headache worse with upright posture)  Light-headedness (temporary)  Pain at injection site (serveral days)  Decreased blood pressure (rare, temporary)  Weakness in arm/leg (temporary)  Pressure sensation in back/neck (temporary)   Call if you experience:   Fever/chills associated with headache or increased back/neck pain  Headache worsened by an upright position  New onset, weakness or numbness of an extremity below the injection site  Hives or difficulty breathing (go to the emergency room)  Inflammation or drainage at the injection site(s)  Severe back/neck pain greater than usual  New symptoms which are concerning to you  Please note:  Although the local anesthetic injected can often make your back or neck feel good for several hours after the injection, the pain will likely return. It takes 3-7 days for steroids to work.  You may not notice any pain relief for at least one week.  If effective, we will often do a series of 2-3 injections spaced 3-6 weeks apart  to maximally decrease your pain.  After the initial series, you may be a candidate for a more permanent nerve block of the facets.  If you have any questions, please call #[phone]) [facility]

## 2016-03-25 NOTE — Progress Notes (Signed)
#  6 out of 120 Oxycodone 5mg  remaining. Filled on 02-26-16.

## 2016-03-31 LAB — TOXASSURE SELECT 13 (MW), URINE: PDF: 0

## 2016-04-02 ENCOUNTER — Other Ambulatory Visit: Payer: Self-pay | Admitting: Family Medicine

## 2016-06-09 ENCOUNTER — Other Ambulatory Visit
Admission: RE | Admit: 2016-06-09 | Discharge: 2016-06-09 | Disposition: A | Discharge status: Home / Self Care | Payer: Medicare Other | Source: Ambulatory Visit | Attending: Pain Medicine | Admitting: Pain Medicine

## 2016-06-09 ENCOUNTER — Ambulatory Visit: Payer: Medicare Other | Attending: Pain Medicine | Admitting: Pain Medicine

## 2016-06-09 ENCOUNTER — Encounter: Payer: Self-pay | Admitting: Pain Medicine

## 2016-06-09 ENCOUNTER — Encounter (INDEPENDENT_AMBULATORY_CARE_PROVIDER_SITE_OTHER): Payer: Self-pay

## 2016-06-09 VITALS — BP 135/94 | HR 73 | Temp 98.0°F | Resp 18 | Ht 74.0 in | Wt 280.0 lb

## 2016-06-09 DIAGNOSIS — R202 Paresthesia of skin: Secondary | ICD-10-CM | POA: Insufficient documentation

## 2016-06-09 DIAGNOSIS — F411 Generalized anxiety disorder: Secondary | ICD-10-CM | POA: Insufficient documentation

## 2016-06-09 DIAGNOSIS — M5136 Other intervertebral disc degeneration, lumbar region: Secondary | ICD-10-CM | POA: Diagnosis not present

## 2016-06-09 DIAGNOSIS — M1711 Unilateral primary osteoarthritis, right knee: Secondary | ICD-10-CM | POA: Diagnosis not present

## 2016-06-09 DIAGNOSIS — M48061 Spinal stenosis, lumbar region without neurogenic claudication: Secondary | ICD-10-CM

## 2016-06-09 DIAGNOSIS — M5116 Intervertebral disc disorders with radiculopathy, lumbar region: Secondary | ICD-10-CM | POA: Insufficient documentation

## 2016-06-09 DIAGNOSIS — G9619 Other disorders of meninges, not elsewhere classified: Secondary | ICD-10-CM | POA: Insufficient documentation

## 2016-06-09 DIAGNOSIS — Z79891 Long term (current) use of opiate analgesic: Secondary | ICD-10-CM | POA: Diagnosis not present

## 2016-06-09 DIAGNOSIS — M47816 Spondylosis without myelopathy or radiculopathy, lumbar region: Secondary | ICD-10-CM | POA: Diagnosis not present

## 2016-06-09 DIAGNOSIS — F1190 Opioid use, unspecified, uncomplicated: Secondary | ICD-10-CM

## 2016-06-09 DIAGNOSIS — R209 Unspecified disturbances of skin sensation: Secondary | ICD-10-CM | POA: Diagnosis not present

## 2016-06-09 DIAGNOSIS — M533 Sacrococcygeal disorders, not elsewhere classified: Secondary | ICD-10-CM | POA: Diagnosis not present

## 2016-06-09 DIAGNOSIS — E559 Vitamin D deficiency, unspecified: Secondary | ICD-10-CM | POA: Insufficient documentation

## 2016-06-09 DIAGNOSIS — Z5181 Encounter for therapeutic drug level monitoring: Secondary | ICD-10-CM | POA: Diagnosis not present

## 2016-06-09 DIAGNOSIS — M546 Pain in thoracic spine: Secondary | ICD-10-CM | POA: Insufficient documentation

## 2016-06-09 DIAGNOSIS — M545 Low back pain: Secondary | ICD-10-CM | POA: Diagnosis not present

## 2016-06-09 DIAGNOSIS — M4806 Spinal stenosis, lumbar region: Secondary | ICD-10-CM

## 2016-06-09 DIAGNOSIS — Z87891 Personal history of nicotine dependence: Secondary | ICD-10-CM | POA: Diagnosis not present

## 2016-06-09 DIAGNOSIS — M25561 Pain in right knee: Secondary | ICD-10-CM | POA: Diagnosis not present

## 2016-06-09 DIAGNOSIS — M2578 Osteophyte, vertebrae: Secondary | ICD-10-CM | POA: Diagnosis not present

## 2016-06-09 DIAGNOSIS — M5384 Other specified dorsopathies, thoracic region: Secondary | ICD-10-CM

## 2016-06-09 DIAGNOSIS — M4804 Spinal stenosis, thoracic region: Secondary | ICD-10-CM | POA: Diagnosis not present

## 2016-06-09 DIAGNOSIS — M549 Dorsalgia, unspecified: Secondary | ICD-10-CM | POA: Diagnosis present

## 2016-06-09 DIAGNOSIS — M5124 Other intervertebral disc displacement, thoracic region: Secondary | ICD-10-CM | POA: Diagnosis not present

## 2016-06-09 DIAGNOSIS — G8929 Other chronic pain: Secondary | ICD-10-CM | POA: Insufficient documentation

## 2016-06-09 DIAGNOSIS — M5020 Other cervical disc displacement, unspecified cervical region: Secondary | ICD-10-CM

## 2016-06-09 DIAGNOSIS — I10 Essential (primary) hypertension: Secondary | ICD-10-CM | POA: Diagnosis not present

## 2016-06-09 DIAGNOSIS — M47894 Other spondylosis, thoracic region: Secondary | ICD-10-CM

## 2016-06-09 DIAGNOSIS — M479 Spondylosis, unspecified: Secondary | ICD-10-CM

## 2016-06-09 DIAGNOSIS — M4802 Spinal stenosis, cervical region: Secondary | ICD-10-CM

## 2016-06-09 DIAGNOSIS — D1800 Hemangioma unspecified site: Secondary | ICD-10-CM | POA: Diagnosis not present

## 2016-06-09 DIAGNOSIS — M5126 Other intervertebral disc displacement, lumbar region: Secondary | ICD-10-CM

## 2016-06-09 DIAGNOSIS — M47814 Spondylosis without myelopathy or radiculopathy, thoracic region: Secondary | ICD-10-CM

## 2016-06-09 DIAGNOSIS — M79606 Pain in leg, unspecified: Secondary | ICD-10-CM | POA: Diagnosis present

## 2016-06-09 LAB — C-REACTIVE PROTEIN: CRP: 0.8 mg/dL (ref <1.0)

## 2016-06-09 LAB — COMPREHENSIVE METABOLIC PANEL
ALBUMIN: 4.2 g/dL (ref 3.5–5.0)
ALK PHOS: 65 U/L (ref 38–126)
ALT: 15 U/L — ABNORMAL LOW (ref 17–63)
ANION GAP: 8 (ref 5–15)
AST: 15 U/L (ref 15–41)
BUN: 9 mg/dL (ref 6–20)
CALCIUM: 8.8 mg/dL — AB (ref 8.9–10.3)
CO2: 27 mmol/L (ref 22–32)
Chloride: 99 mmol/L — ABNORMAL LOW (ref 101–111)
Creatinine, Ser: 0.68 mg/dL (ref 0.61–1.24)
GFR calc Af Amer: >60 mL/min (ref >60)
GFR calc non Af Amer: >60 mL/min (ref >60)
GLUCOSE: 311 mg/dL — AB (ref 65–99)
POTASSIUM: 4.3 mmol/L (ref 3.5–5.1)
SODIUM: 134 mmol/L — AB (ref 135–145)
Total Bilirubin: 1.1 mg/dL (ref 0.3–1.2)
Total Protein: 7 g/dL (ref 6.5–8.1)

## 2016-06-09 LAB — VITAMIN B12: Vitamin B-12: 297 pg/mL (ref 180–914)

## 2016-06-09 LAB — MAGNESIUM: Magnesium: 1.7 mg/dL (ref 1.7–2.4)

## 2016-06-09 LAB — SEDIMENTATION RATE: Sed Rate: 6 mm/hr (ref 0–15)

## 2016-06-09 MED ORDER — OXYCODONE HCL 5 MG PO TABS: 5.0000 mg | ORAL_TABLET | Freq: Four times a day (QID) | ORAL | Status: DC | PRN

## 2016-06-09 NOTE — Progress Notes (Signed)
Safety precautions to be maintained throughout the outpatient stay will include: orient to surroundings, keep bed in low position, maintain call bell within reach at all times, provide assistance with transfer out of bed and ambulation.   Oxycodone 5mg .  57/120   Filled 05-25-16

## 2016-06-09 NOTE — Progress Notes (Signed)
Patient's Name: Larry Goodwin  Patient type: Established  MRN: [id number]  Service setting: Ambulatory outpatient  DOB: 07/03/1976  Location: [facility]  DOS: 06/09/2016  Primary Care Physician: Ria Bush, MD  Note by: Kathlen Brunswick. Dossie Arbour, M.D, [place], [place], DABPM, Milagros Evener, [place]  Referring Physician: Ria Bush, MD  Specialty: Board-Certified Interventional Pain Management  Last Visit to Pain Management: 03/25/2016   Primary Reason(s) for Visit: Encounter for prescription drug management (Level of risk: moderate) CC: Back Pain and Leg Pain   HPI  Larry Goodwin is a 40 y.o. year old, male patient, who returns today as an established patient. He has Lumbar DDD (degenerative disc disease); HTN (hypertension); Bell's palsy; Severe obesity (BMI >= 40) ([place]); Chronic pain syndrome; Medicare annual wellness visit, initial; Hyperglycemia; History of tobacco abuse; Low magnesium levels; Vitamin D deficiency; Myofascial pain syndrome; Lumbar facet syndrome (Bilateral) (R>L); Chronic upper back pain; Generalized anxiety disorder; Depression; Lumbar spondylosis with radiculopathy; Epidural fibrosis; Failed back surgical syndrome; Encounter for therapeutic drug level monitoring; Long term prescription opiate use; Long term current use of opiate analgesic; Opiate use (30 MME/Day); Chronic low back pain (Bilateral) (R>L); Chronic pain; Chronic sacroiliac joint pain (Right); Abnormal MRI, lumbar spine;  Abnormal CT myelogram of the thoracic spine; Thoracic disc herniation; Bulge of lumbar disc without myelopathy; Vertebral body hemangioma; Disturbance of skin sensation; Cervical IVDD (intervertebral disc displacement); Cervical foraminal stenosis (C5-6 and C7-T1: Right); Cervical central spinal stenosis (C5-6); Thoracic IVDD (intervertebral disc displacement); Thoracic facet arthropathy; Thoracic facet syndrome; Lumbar IVDD (intervertebral disc displacement); Lumbar foraminal  stenosis (Right L4-5); Lumbar central spinal stenosis (L3-4 and L4-5); Chronic knee pain (Right); and Osteoarthritis of knee (Right) on his problem list.. His primarily concern today is the Back Pain and Leg Pain   Pain Assessment: Self-Reported Pain Score: 3  Reported level is compatible with observation       Pain Type: Chronic pain Pain Location: Back Pain Orientation: Mid, Lower Pain Descriptors / Indicators: Constant, Aching, Burning, Sharp Pain Frequency: Constant  The patient comes into the clinics today for pharmacological management of his chronic pain. I last saw this patient on 03/25/2016. The patient  reports that he does not use illicit drugs. His body mass index is 35.93 kg/(m^2).  Date of Last Visit: 03/25/16 Service Provided on Last Visit: Med Refill  Controlled Substance Pharmacotherapy Assessment & REMS (Risk Evaluation and Mitigation Strategy)  Analgesic: Oxycodone IR 5 mg every 6 hours (4 per day) (20 mg/day) MME/day: 30 mg/day.  Pill Count: Oxycodone 73m. 57/120 Filled 05-25-16 Pharmacokinetics: Onset of action (Liberation/Absorption): Within expected pharmacological parameters Time to Peak effect (Distribution): Timing and results are as within normal expected parameters Duration of action (Metabolism/Excretion): Within normal limits for medication Pharmacodynamics: Analgesic Effect: More than 50% Activity Facilitation: Medication(s) allow patient to sit, stand, walk, and do the basic ADLs Perceived Effectiveness: Described as relatively effective, allowing for increase in activities of daily living (ADL) Side-effects or Adverse reactions: None reported Monitoring: [place] PMP: Online review of the past 163-montheriod conducted. Compliant with practice rules and regulations Last UDS on record: TOXASSURE SELECT 13  Date Value Ref Range Status  03/25/2016 FINAL  Final    Comment:    ==================================================================== TOXASSURE  SELECT 13 (MW) ==================================================================== Test                             Result       Flag  Units Drug Present and Declared for Prescription Verification   Oxycodone                      252          EXPECTED   ng/mg creat   Oxymorphone                    317          EXPECTED   ng/mg creat   Noroxycodone                   713          EXPECTED   ng/mg creat    Sources of oxycodone include scheduled prescription medications.    Oxymorphone and noroxycodone are expected metabolites of    oxycodone. Oxymorphone is also available as a scheduled    prescription medication. ==================================================================== Test                      Result    Flag   Units      Ref Range   Creatinine              23               mg/dL      >=20 ==================================================================== Declared Medications:  The flagging and interpretation on this report are based on the  following declared medications.  Unexpected results may arise from  inaccuracies in the declared medications.  **Note: The testing scope of this panel includes these medications:  Oxycodone  **Note: The testing scope of this panel does not include following  reported medications:  Lisinopril  Magnesium  Vitamin D3 ==================================================================== For clinical consultation, please call [phone]. ====================================================================    UDS interpretation: Compliant          Medication Assessment Form: Reviewed. Patient indicates being compliant with therapy Treatment compliance: Compliant Risk Assessment: Aberrant Behavior: None observed today Substance Use Disorder (SUD) Risk Level: No change since last visit Risk of opioid abuse or dependence: 0.7-3.0% with doses ? 36 MME/day and 6.1-26% with doses ? 120 MME/day. Opioid Risk Tool (ORT) Score: Total  Score: 1 Low Risk for SUD (Score <3) Depression Scale Score: PHQ-2:         PHQ-9:          Pharmacologic Plan: No change in therapy, at this time  Laboratory Chemistry  Inflammation Markers Lab Results  Component Value Date   ESRSEDRATE 6 06/09/2016   CRP 0.8 06/09/2016    Renal Function Lab Results  Component Value Date   BUN 9 06/09/2016   CREATININE 0.68 06/09/2016   GFRAA >60 06/09/2016   GFRNONAA >60 06/09/2016    Hepatic Function Lab Results  Component Value Date   AST 15 06/09/2016   ALT 15* 06/09/2016   ALBUMIN 4.2 06/09/2016    Electrolytes Lab Results  Component Value Date   NA 134* 06/09/2016   K 4.3 06/09/2016   CL 99* 06/09/2016   CALCIUM 8.8* 06/09/2016   MG 1.7 06/09/2016    Pain Modulating Vitamins Lab Results  Component Value Date   VITAMINB12 297 06/09/2016    Coagulation Parameters Lab Results  Component Value Date   PLT 240 08/03/2014    Note: Labs Reviewed.  Recent Diagnostic Imaging  Cervical Imaging: Cervical MR wo contrast:  Results for orders placed in visit on 09/07/11  MR [place] W/O Cm  Narrative * PRIOR REPORT IMPORTED FROM AN EXTERNAL SYSTEM *   PRIOR REPORT IMPORTED FROM THE [facility] WORKFLOW SYSTEM   REASON FOR EXAM:    neck pain w bila numbness and tingling radiculopathy  mid back pain  COMMENTS:   PROCEDURE:     MMR - MMR CERVICAL SPINE WO CONT  - Sep 07 2011  1:55PM   RESULT:   Technique: Multiplanar and multisequence imaging of the cervical spine was  obtained without the administration of gadolinium.   Findings: Evaluation of the osseous structures demonstrates no evidence of  marrow edema. Multilevel disc desiccation is appreciated throughout the  cervical spine.   Pannus formation is appreciated along the posterior aspect of the dens.  There is mild stenosis at the craniocervical junction.   At the C2-C3 disc space level, there is no evidence of thecal sac stenosis  or neuroforaminal  narrowing.   At the C3-C4 disc space level, there is mild effacement of the anterior  CSF  space secondary to mild disc bulge without evidence of thecal sac stenosis  or neuroforaminal narrowing.   At the C4-C5 level, similar findings are identified.   At the C5-C6 level, a broad-based disc bulge is appreciated with resulting  mild thecal sac stenosis. There is lateralization to the right and left  with  mild neuroforaminal narrowing on the right without evidence of exiting  nerve  root compromise or compression.   At the C6-C7 level, a broad-based disc bulge is appreciated with resulting  partial effacement of anterior CSF space. The neural foramen appears  patent.   At the C7-T1 level, there is no evidence of thecal sac stenosis. There is  mild neuroforaminal narrowing on the right with possible exiting nerve  root  compromise.   There is no evidence of abnormal T1 or T2 signal within the cervical cord.   IMPRESSION:   Multilevel mild degenerative disc disease changes with resulting mild  thecal  sac narrowing. There are areas of neuroforaminal narrowing with possible  exiting nerve root compromise as described above particularly at the C6-C7  level on the right.   Thank you for the opportunity to contribute to the care of your patient.       Cervical DG complete:  Results for orders placed in visit on 07/10/05  DG Cervical Spine Complete   Narrative * PRIOR REPORT IMPORTED FROM AN EXTERNAL SYSTEM *   PRIOR REPORT IMPORTED FROM THE [facility] WORKFLOW SYSTEM   REASON FOR EXAM:  Pain  COMMENTS:   PROCEDURE:     DXR - DXR CERVICAL SPINE COMPLETE  - Jul 10 2005  3:19PM   RESULT:     AP, lateral and oblique views of the cervical spine show the  vertebral body heights and intervertebral disc spaces to be well  maintained.  The vertebral body alignment is normal. Oblique views show the neural  foramina to be widely patent bilaterally. The odontoid process is intact.  No   cervical rib formation is seen. There is slight hypertrophic spurring  anteriorly at C5-6.   IMPRESSION:     No acute changes are identified.       Shoulder Imaging: Shoulder-L DG:  Results for orders placed in visit on 04/30/08  DG Shoulder Left   Narrative * PRIOR REPORT IMPORTED FROM AN EXTERNAL SYSTEM *   PRIOR REPORT IMPORTED FROM THE [facility] WORKFLOW SYSTEM   REASON FOR EXAM:    Pain  COMMENTS:   PROCEDURE:     DXR -  DXR SHOULDER LEFT COMPLETE  - Apr 30 2008  4:51PM   RESULT:     Comparison is made to images of the LEFT shoulder dated  03/17/2006.   The humeral head is located in the glenoid. There is no fracture,  dislocation or radiopaque foreign body evident. If there is concern for  occult fracture then further imaging is suggested.   IMPRESSION:      Please see above.   Thank you for this opportunity to contribute to the care of your patient.       Thoracic Imaging: Thoracic MR wo contrast:  Results for orders placed in visit on 09/07/11  MR [place] W/O Cm   Narrative * PRIOR REPORT IMPORTED FROM AN EXTERNAL SYSTEM *   PRIOR REPORT IMPORTED FROM THE [facility] WORKFLOW SYSTEM   REASON FOR EXAM:    neck pain w bila numbness and tingling radiculopathy  mid back pain  COMMENTS:   PROCEDURE:     MMR - MMR THORACIC SPINE WO  - Sep 07 2011  2:36PM   RESULT:   Technique: Multiplanar and multisequence imaging of the thoracic spine was  obtained without the administration of gadolinium.   Findings: The thoracic cord demonstrates no T1 or T2 signal abnormalities.   At T6-T7 level, a central disc protrusion is appreciated causing mass  effect  upon the anterior thoracic cord and mild thecal sac narrowing.   At the T7-T8 level, asymmetric disc protrusion is appreciated causing mass  effect upon the lateral aspect the cord. There is mild to moderate  asymmetric thecal sac narrowing at this level.   At the T8-T9 level, a focal disc protrusion is appreciated  causing  encroachment upon the thoracic cord and asymmetric mild to moderate thecal  sac narrowing.   At the T9-T10 level, a focal disc extrusion is appreciated causing mass  effect upon the cervical cord and mild thecal sac narrowing.   There is no evidence of marrow edema within the thoracic spine. Multiple  levels of disc desiccation, partial to complete are identified within the  thoracic spine.   IMPRESSION:   Multiple disc protrusions extending from the T6 through the T9 levels with  encroachment upon the thoracic cord and resulting in areas of moderate  thecal sac narrowing. There does not appear to be appreciable neural  foraminal narrowing.   Thank you for the opportunity to contribute to the care of your patient.       Thoracic CT w contrast:  Results for orders placed during the hospital encounter of 09/28/11  CT Thoracic Spine W Contrast   Narrative *RADIOLOGY REPORT*  Clinical Data:  Back pain.  Multilevel disc herniations.   MYELOGRAM INJECTION  Technique:  Informed consent was obtained from the patient prior to the procedure, including potential complications of headache, allergy, infection and pain.  A timeout procedure was performed. With the patient prone, the lower back was prepped with Betadine. 1% Lidocaine was used for local anesthesia.  Lumbar puncture was performed at the right L2-3 level using a 5 inches 22 gauge needle with return of clear CSF.  Nine ml of Omnipaque 300was injected into the subarachnoid space .  IMPRESSION: Successful injection of  intrathecal contrast for myelography.  MYELOGRAM THORACIC  Technique: Following injection of intrathecal Omnipaque contrast, spine imaging in multiple projections was performed using fluoroscopy.  Fluoroscopy Time: 1 minute 21 seconds .  Comparison:  Outside MRI  Findings: There is no severe central canal stenosis or block  to passage of contrast.  There are anterior extradural defects at  T6- 7, T7-8, T8-9 and T9-10 that efface the ventral subarachnoid space.  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  CT MYELOGRAPHY THORACIC SPINE  Technique:  CT imaging of the thoracic spine was performed after intrathecal contrast administration.  Multiplanar CT image reconstructions were also generated.  Findings:  There is no significant finding at T5-6 or above.  The discs are unremarkable.  There is mild facet degeneration and some ligamentous prominence but no compressive narrowing of the canal or foramina.  T6-7:  Central disc herniation effaces the ventral subarachnoid space and indents the cord more towards the right.  Mild facet degeneration and ligamentous prominence.  T7-8:  Right posterolateral disc herniation indents the thecal sac in the right side of the cord.  Mild facet degeneration and ligamentous prominence.  T8-9:  Central right posterolateral disc herniation indents the thecal sac in the right side of the cord.  Facet degeneration and ligamentous prominence.  T9-10:  Central disc herniation with slight caudal migration indents the thecal sac and the ventral aspect of the cord on the left.  Mild facet degeneration and ligamentous prominence.  T10-11:  Mild bulging of the disc.  Facet degeneration and ligamentous prominence.  T11-12:  Mild bulging of the disc.  Facet degeneration and ligamentous prominence.  T12-L1:  Unremarkable interspace.  No evidence of cord lesion.  1 can appreciate small hemangiomas scattered within the spine, most notable on the left posterior corner of the T7 vertebral body.  There are anterior bridging osteophytes at T7-8 and T8-9 which would appear to result in functional anterior fusion.  IMPRESSION: T6-7:  Central disc herniation indents the right side of the cord. Facet degeneration and ligamentous prominence/calcification.  T7-8:  Right posterolateral disc herniation indents the right  side of the cord.  Facet degeneration and ligamentous prominence/calcification.  T8-9:  Central right posterolateral disc herniation indents the right side of the cord.  Facet degeneration and ligamentous prominence/calcification.  T9-10:  Central disc herniation with caudal migration indents the ventral cord more towards the left.  Facet degeneration and ligamentous prominence/calcification.  Other findings of note include anterior bridging osteophytes at T7- 8 and T8-9 that appear to result in anterior fusion. There are lesser changes of facet degeneration and ligamentous prominence/calcification at other levels throughout the thoracic region.  Original Report Authenticated By: Jules Schick, M.D.   Thoracic DG Myelogram views:  Results for orders placed during the hospital encounter of 09/28/11  [facility] Myelogram Thoracic   Narrative *RADIOLOGY REPORT*  Clinical Data:  Back pain.  Multilevel disc herniations.   MYELOGRAM INJECTION  Technique:  Informed consent was obtained from the patient prior to the procedure, including potential complications of headache, allergy, infection and pain.  A timeout procedure was performed. With the patient prone, the lower back was prepped with Betadine. 1% Lidocaine was used for local anesthesia.  Lumbar puncture was performed at the right L2-3 level using a 5 inches 22 gauge needle with return of clear CSF.  Nine ml of Omnipaque 300was injected into the subarachnoid space .  IMPRESSION: Successful injection of  intrathecal contrast for myelography.  MYELOGRAM THORACIC  Technique: Following injection of intrathecal Omnipaque contrast, spine imaging in multiple projections was performed using fluoroscopy.  Fluoroscopy Time: 1 minute 21 seconds .  Comparison:  Outside MRI  Findings: There is no severe central canal stenosis or block to passage of contrast.  There are anterior  extradural defects at T6- 7, T7-8, T8-9 and T9-10 that  efface the ventral subarachnoid space.  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  CT MYELOGRAPHY THORACIC SPINE  Technique:  CT imaging of the thoracic spine was performed after intrathecal contrast administration.  Multiplanar CT image reconstructions were also generated.  Findings:  There is no significant finding at T5-6 or above.  The discs are unremarkable.  There is mild facet degeneration and some ligamentous prominence but no compressive narrowing of the canal or foramina.  T6-7:  Central disc herniation effaces the ventral subarachnoid space and indents the cord more towards the right.  Mild facet degeneration and ligamentous prominence.  T7-8:  Right posterolateral disc herniation indents the thecal sac in the right side of the cord.  Mild facet degeneration and ligamentous prominence.  T8-9:  Central right posterolateral disc herniation indents the thecal sac in the right side of the cord.  Facet degeneration and ligamentous prominence.  T9-10:  Central disc herniation with slight caudal migration indents the thecal sac and the ventral aspect of the cord on the left.  Mild facet degeneration and ligamentous prominence.  T10-11:  Mild bulging of the disc.  Facet degeneration and ligamentous prominence.  T11-12:  Mild bulging of the disc.  Facet degeneration and ligamentous prominence.  T12-L1:  Unremarkable interspace.  No evidence of cord lesion.  1 can appreciate small hemangiomas scattered within the spine, most notable on the left posterior corner of the T7 vertebral body.  There are anterior bridging osteophytes at T7-8 and T8-9 which would appear to result in functional anterior fusion.  IMPRESSION: T6-7:  Central disc herniation indents the right side of the cord. Facet degeneration and ligamentous prominence/calcification.  T7-8:  Right posterolateral disc herniation indents the right side of the cord.  Facet  degeneration and ligamentous prominence/calcification.  T8-9:  Central right posterolateral disc herniation indents the right side of the cord.  Facet degeneration and ligamentous prominence/calcification.  T9-10:  Central disc herniation with caudal migration indents the ventral cord more towards the left.  Facet degeneration and ligamentous prominence/calcification.  Other findings of note include anterior bridging osteophytes at T7- 8 and T8-9 that appear to result in anterior fusion. There are lesser changes of facet degeneration and ligamentous prominence/calcification at other levels throughout the thoracic region.  Original Report Authenticated By: Jules Schick, M.D.   Lumbosacral Imaging:. Lumbar MR wo contrast:  Results for orders placed in visit on 03/05/11  MR [facility] W/O Cm   Narrative * PRIOR REPORT IMPORTED FROM AN EXTERNAL SYSTEM *   PRIOR REPORT IMPORTED FROM THE [facility] [place] EXAM:    low back pain radiating down RT side  COMMENTS:   PROCEDURE:     MMR - MMR LUMBAR SPINE WO CONTRAST  - Mar 05 2011  2:36PM   RESULT:   TECHNIQUE: Multiplanar and multisequence imaging of the lumbar spine was  obtained without the administration of gadolinium.   The conus medullaris terminates at an L1-L2 level. Cauda equina  demonstrate  no evidence of clumping or thickening.   Evaluation of the osseous structures demonstrates a rounded area of  increased T1 and T2 signal with speckled areas of decreased signal. This  focus demonstrates decreased signal on the fat suppressed images and these  findings are indicative of a hemangioma. Similar finding though to a  lesser  extent is identified within the L1 vertebral body as well as L4.  At the L1-L2 level, a broad-based disc bulge is appreciated causing  partial  effacement of the anterior CSF space. There is no evidence of  neuroforaminal  narrowing.   At the L2-L3 disc space level, there  is no evidence of thecal sac stenosis  or neuroforaminal narrowing.   At the L3-L4 disc space level, a broad-based disc bulge appreciated  causing  partial effacement of the anterior CSF space with resulting mild to  moderate  thecal sac stenosis. There is no evidence of significant neuroforaminal  narrowing.   At the L4-L5 level, a broad-based disc bulge is appreciated demonstrating  lateralization to the right. Lateralize portion of the disc causes partial  effacement of the anterior CSF space. There is mild to moderate asymmetric  thecal sac stenosis as well as moderate neuroforaminal narrowing on the  right. Exiting nerve root compromise, mild cannot be excluded.   At the L5-S1 level, there is no evidence of thecal sac stenosis or  neuroforaminal narrowing.   Multilevel disc desiccation is appreciated within the lumbar spine  primarily  within the mid and lower portions.  There is no evidence of marrow edema.   IMPRESSION:   1.     Regions of mild disc bulges as described above with areas of mild  to  moderate thecal sac stenosis. There is no evidence of severe  neuroforaminal  narrowing. Exiting nerve root compromise bilaterally at the L4-L5 level  cannot be excluded.  2.     Incidental note is made of multilevel hemangiomas.   Thank you for the opportunity to contribute to the care of your patient.       Lumbar MR w/wo contrast:  Results for orders placed during the hospital encounter of 04/24/13  MR Lumbar Spine W Wo Contrast   Narrative *RADIOLOGY REPORT*  Clinical Data: Low back pain and right leg pain.  Prior lumbar surgery x 2.  BUN and creatinine were obtained on site at [place] at [address] Results:  BUN 9 mg/dL,  Creatinine 0.9 mg/dL.  MRI LUMBAR SPINE WITHOUT AND WITH CONTRAST  Technique:  Multiplanar and multiecho pulse sequences of the lumbar spine were obtained without and with intravenous contrast.  Contrast: 32m  MULTIHANCE GADOBENATE DIMEGLUMINE 529 MG/ML IV SOLN  Comparison: Radiographs dated 09/22/2012 and a MRI dated 05/03/2011  Findings: Normal conus tip at L1-2.   Benign hemangiomas and L1 and L2 and L4.  Normal paraspinal soft tissues.  T11-12 through L2-3:  No significant abnormality.  L3-4:  Tiny central disc bulge with no neural impingement.  L4-5:  Slight scarring around the right side of the thecal sac and in the right lateral recess.  This enhances to the expected degree after contrast administration.  No new or recurrent disc protrusion.  No neural impingement.  Previous right hemilaminotomy.  The fluid collections seen in the soft tissues on the prior exam have completely resolved.  L5-S1:  Normal.  IMPRESSION:  1.  Postsurgical changes at L4-5 with scarring around the right side of the thecal sac and the right lateral recess.  No new or recurrent disc protrusion or neural impingement. 2.  No other significant abnormalities.   Original Report Authenticated By: JLorriane Shire M.D.    Lumbar DG 2-3 views:  Results for orders placed in visit on 04/20/14  DG Lumbar Spine 2-3 Views   Narrative * PRIOR REPORT IMPORTED FROM AN EXTERNAL SYSTEM *   CLINICAL DATA:  Low back pain of 3 days duration.  EXAM:  LUMBAR SPINE - 2-3 VIEW   COMPARISON:  09/08/2011   FINDINGS:  Alignment is normal. There is disc space narrowing worse at L4-5  than at L3-4. Endplate sclerotic changes are present. The patient  has had previous partial hemilaminectomy at that level. There is  lower lumbar facet degeneration.   IMPRESSION:  Lower lumbar degenerative disc disease and degenerative facet  disease. Previous surgery at L4-5. No identifiable acute finding.    Electronically Signed    By: Nelson Chimes M.D.    On: 04/20/2014 15:06       Lumbar DG (Complete) 4+V:  Results for orders placed during the hospital encounter of 09/22/12  DG Lumbar Spine Complete   Narrative *RADIOLOGY  REPORT*  Clinical Data: Low back pain  LUMBAR SPINE - COMPLETE 4+ VIEW  Comparison: MRI 05/03/2011  Findings: Normal alignment.  Disc degeneration and spurring at L3-4 L4-5.  Negative for pars defect or fracture.  IMPRESSION: Lumbar disc degeneration L3-4 and L4-5.  No acute bony abnormality.   Original Report Authenticated By: Truett Perna, M.D.    Lumbar DG F/E views:  Results for orders placed during the hospital encounter of 04/24/13  DG Lumb Spine Flex&Ext Only   Narrative *RADIOLOGY REPORT*  Clinical Data: Low back pain right worse than left on the flexion  LUMBAR SPINE FLEX AND EXTEND ONLY - 2-3 VIEW  Comparison: MRI same day  Findings: There is disc space narrowing at T12-L1, L1-2, L3-4 and L4-5.  There is some sclerotic change at the L4-5 level.  Alignment is normal in the neutral position.  With flexion and extension, no subluxation occurs.IMPRESSION: Disc space narrowing as described above, most notable at L3-4 and L4-5.  No subluxation with flexion and extension.   Original Report Authenticated By: Nelson Chimes, M.D.    Knee Imaging: Knee-R DG 4 views:  Results for orders placed in visit on 10/16/09  DG Knee Complete 4 Views Right   Narrative * PRIOR REPORT IMPORTED FROM AN EXTERNAL SYSTEM *   PRIOR REPORT IMPORTED FROM THE [facility] WORKFLOW SYSTEM   REASON FOR EXAM:    fall, trauma  COMMENTS:   PROCEDURE:     DXR - DXR KNEE RT COMP WITH OBLIQUES  - Oct 16 2009  6:32PM   RESULT:     Degenerative changes are noted. There is no evidence of  fracture, dislocation or radiopaque foreign body.   IMPRESSION:      Please see above.   Thank you for the opportunity to contribute to the care of your patient.       Note: Imaging reviewed.  Meds  The patient has a current medication list which includes the following prescription(s): vitamin d3, lisinopril, magnesium oxide, oxycodone, oxycodone, and oxycodone.  Current Outpatient Prescriptions on File  Prior to Visit  Medication Sig  . Cholecalciferol (VITAMIN D3) 2000 units capsule Take 1 capsule (2,000 Units total) by mouth daily.  Marland Kitchen lisinopril (PRINIVIL,ZESTRIL) 40 MG tablet TAKE 1 TABLET (40 MG TOTAL) BY MOUTH DAILY.  . Magnesium Oxide 500 MG CAPS Take 1 capsule (500 mg total) by mouth daily.   No current facility-administered medications on file prior to visit.    ROS  Constitutional: Denies any fever or chills Gastrointestinal: No reported hemesis, hematochezia, vomiting, or acute GI distress Musculoskeletal: Denies any acute onset joint swelling, redness, loss of ROM, or weakness Neurological: No reported episodes of acute onset apraxia, aphasia, dysarthria, agnosia, amnesia, paralysis, loss of coordination, or loss of  consciousness  Allergies  Mr. Bena is allergic to levaquin.  [place]  Medical:  Mr. Hohmann  has a past medical history of DDD (degenerative disc disease), lumbar; HTN (hypertension); Bell's palsy (2015); Morbid obesity ([place]) (08/16/2014); and Chronic pain syndrome. Family: family history includes COPD in his mother; Hypertension in his other. There is no history of Stroke, Cancer, Diabetes, or CAD. Surgical:  has past surgical history that includes Back surgery (2012) and Cholecystectomy. Tobacco:  reports that he quit smoking about 8 years ago. He has never used smokeless tobacco. Alcohol:  reports that he does not drink alcohol. Drug:  reports that he does not use illicit drugs.  Constitutional Exam  Vitals: Blood pressure 135/94, pulse 73, temperature 98 F (36.7 C), temperature source Oral, resp. rate 18, height '6\' 2"'  (1.88 m), weight 280 lb (127.007 kg), SpO2 100 %. General appearance: Well nourished, well developed, and well hydrated. In no acute distress Calculated BMI/Body habitus: Body mass index is 35.93 kg/(m^2).       Psych/Mental status: Alert and oriented x 3 (person, place, & time) Eyes: PERLA Respiratory: No evidence of acute respiratory  distress  Cervical Spine Exam  Inspection: No masses, redness, or swelling Alignment: Symmetrical ROM: Functional: ROM is within functional limits [facility]) Stability: No instability detected Muscle strength & Tone: Functionally intact Sensory: Unimpaired Palpation: No complaints of tenderness  Upper Extremity (UE) Exam    Side: Right upper extremity  Side: Left upper extremity  Inspection: No masses, redness, swelling, or asymmetry  Inspection: No masses, redness, swelling, or asymmetry  ROM:  ROM:  Functional: ROM is within functional limits [facility])  Functional: ROM is within functional limits [facility])  Muscle strength & Tone: Functionally intact  Muscle strength & Tone: Functionally intact  Sensory: Unimpaired  Sensory: Unimpaired  Palpation: Non-contributory  Palpation: Non-contributory   Thoracic Spine Exam  Inspection: No masses, redness, or swelling Alignment: Symmetrical ROM: Functional: ROM is within functional limits [facility]) Stability: No instability detected Sensory: Unimpaired Muscle strength & Tone: Functionally intact Palpation: No complaints of tenderness  Lumbar Spine Exam  Inspection: No masses, redness, or swelling Alignment: Symmetrical ROM: Functional: ROM is within functional limits [facility]) Stability: No instability detected Muscle strength & Tone: Functionally intact Sensory: Unimpaired Palpation: No complaints of tenderness Provocative Tests: Lumbar Hyperextension and rotation test: deferred Patrick's Maneuver: deferred  Gait & Posture Assessment  Ambulation: Unassisted Gait: Unaffected Posture: WNL  Lower Extremity Exam    Side: Right lower extremity  Side: Left lower extremity  Inspection: No masses, redness, swelling, or asymmetry ROM:  Inspection: No masses, redness, swelling, or asymmetry ROM:  Functional: ROM is within functional limits [facility])  Functional: ROM is within functional limits [facility])  Muscle strength & Tone: Functionally intact  Muscle  strength & Tone: Functionally intact  Sensory: Unimpaired  Sensory: Unimpaired  Palpation: Non-contributory  Palpation: Non-contributory   Assessment & Plan  Primary Diagnosis & Pertinent Problem List: The primary encounter diagnosis was Chronic pain. 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  Visit Diagnosis: 1. Chronic pain   2. Long term current use of opiate analgesic   3. Encounter for therapeutic drug level monitoring   4. Opiate use (30 MME/Day)   5. Chronic low back pain (R>L)   6. Disturbance of skin sensation   7. Lumbar facet syndrome (Bilateral) (R>L)   8. Cervical IVDD (intervertebral disc displacement)   9. 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 Cervical central spinal stenosis (C5-6)   11. Thoracic IVDD (intervertebral disc displacement)   12. Thoracic facet arthropathy   13. Thoracic facet syndrome   14. Lumbar IVDD (intervertebral disc displacement)   15. Lumbar foraminal stenosis (Right L4-5)   16. Lumbar central spinal stenosis (L3-4 and L4-5)   17. Chronic knee pain (Right)   18. Primary osteoarthritis of right knee   19. Chronic sacroiliac joint pain (Right)     Problems updated and reviewed during this visit: Problem  Cervical IVDD  (intervertebral disc displacement)   C3-4: Mild disc bulge. C5-6: Broad-based disc bulge with bilateral laterality sedation. C6-7: Broad-based disc bulge.   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 T7-8: Right posterolateral disc herniation towards the right side. T8-9: Central right posterolateral disc herniation T9-10: Central disc herniation with caudal migration to the left. T10-11: Mild bulging disc. T11-12: Mild bulging disc.   Thoracic facet arthropathy  Thoracic Facet Syndrome  Lumbar IVDD (intervertebral disc displacement)   L1-2: Broad-based disc bulge L3-4: Broad-based disc bulge. L4-5: Broad-based disc bulge with lateral release sedation to the right.   Lumbar foraminal stenosis (Right L4-5)  Lumbar central spinal stenosis (L3-4 and L4-5)  Chronic knee pain (Right)  Osteoarthritis of knee (Right)  Lumbar facet syndrome (Bilateral) (R>L)  Lumbar spondylosis with radiculopathy  Chronic low back pain (Bilateral) (R>L)  Chronic sacroiliac joint pain (Right)  Lumbar DDD (degenerative disc disease)   severe s/p 2 surgeries     Problem-specific Plan(s): No problem-specific assessment & plan notes found for this encounter.  No new assessment & plan notes have been filed under this hospital service since the last note was generated. 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) (Chronic)   Relevant Orders   CERVICAL EPIDURAL STEROID INJECTION   Cervical IVDD (intervertebral disc displacement) (Chronic)   Relevant Orders   CERVICAL EPIDURAL STEROID INJECTION   Chronic knee pain (Right) (Chronic)   Relevant Orders   KNEE INJECTION    GENICULAR NERVE BLOCK   Chronic low back pain (Bilateral) (R>L) (Chronic)   Relevant Medications   oxyCODONE (OXY IR/ROXICODONE) 5 MG immediate release tablet   oxyCODONE (OXY IR/ROXICODONE) 5 MG immediate release tablet   oxyCODONE (OXY IR/ROXICODONE) 5 MG immediate release tablet   Other Relevant Orders   LUMBAR EPIDURAL STEROID INJECTION   LUMBAR FACET(MEDIAL BRANCH NERVE BLOCK) MBNB   LUMBAR EPIDURAL STEROID INJECTION   SACROILIAC JOINT INJECTINS   Chronic pain - Primary (Chronic)   Relevant Medications   oxyCODONE (OXY IR/ROXICODONE) 5 MG immediate release tablet   oxyCODONE (OXY IR/ROXICODONE) 5 MG immediate release tablet   oxyCODONE (OXY IR/ROXICODONE) 5 MG immediate release  tablet   Other Relevant Orders   Comprehensive metabolic panel (Completed)   C-reactive protein (Completed)   Magnesium (Completed)   Sedimentation rate (Completed)   25-Hydroxyvitamin D Lcms D2+D3   Chronic sacroiliac joint pain (Right) (Chronic)   Relevant Medications   oxyCODONE (OXY IR/ROXICODONE) 5 MG immediate release tablet   oxyCODONE (OXY IR/ROXICODONE) 5 MG immediate release tablet   oxyCODONE (OXY IR/ROXICODONE) 5 MG immediate release tablet   Other Relevant Orders   SACROILIAC JOINT INJECTINS   Lumbar central spinal stenosis (L3-4 and L4-5) (Chronic)   Relevant Orders   LUMBAR EPIDURAL STEROID INJECTION   LUMBAR EPIDURAL STEROID INJECTION   Lumbar facet syndrome (Bilateral) (R>L) (Chronic)   Relevant Medications   oxyCODONE (OXY IR/ROXICODONE) 5 MG immediate release tablet   oxyCODONE (OXY IR/ROXICODONE) 5 MG immediate release tablet   oxyCODONE (OXY IR/ROXICODONE) 5 MG immediate release tablet   Other Relevant Orders   LUMBAR FACET(MEDIAL BRANCH NERVE BLOCK) MBNB   Lumbar foraminal stenosis (Right L4-5) (Chronic)   Relevant Orders   Lumbar Transforaminal epidural without steroid   Lumbar IVDD (intervertebral disc displacement) (Chronic)   Relevant Orders   LUMBAR EPIDURAL  STEROID INJECTION   LUMBAR EPIDURAL STEROID INJECTION   Osteoarthritis of knee (Right) (Chronic)   Relevant Medications   oxyCODONE (OXY IR/ROXICODONE) 5 MG immediate release tablet   oxyCODONE (OXY IR/ROXICODONE) 5 MG immediate release tablet   oxyCODONE (OXY IR/ROXICODONE) 5 MG immediate release tablet   Other Relevant Orders   KNEE INJECTION   GENICULAR NERVE BLOCK   Thoracic facet arthropathy (Chronic)   Relevant Orders   THORACIC FACET BLOCK   Thoracic facet syndrome (Chronic)   Relevant Medications   oxyCODONE (OXY IR/ROXICODONE) 5 MG immediate release tablet   oxyCODONE (OXY IR/ROXICODONE) 5 MG immediate release tablet   oxyCODONE (OXY IR/ROXICODONE) 5 MG immediate release tablet   Other Relevant Orders   THORACIC FACET BLOCK   Thoracic IVDD (intervertebral disc displacement) (Chronic)   Relevant Orders   THORACIC EPIDURAL STEROID INJECTION     Medium   Encounter for therapeutic drug level monitoring   Long term current use of opiate analgesic (Chronic)   Relevant Orders   ToxASSURE Select 13 (MW), Urine   Opiate use (30 MME/Day) (Chronic)     Low   Disturbance of skin sensation   Relevant Orders   Vitamin B12 (Completed)       Pharmacotherapy (Medications Ordered): Meds ordered this encounter  Medications  . oxyCODONE (OXY IR/ROXICODONE) 5 MG immediate release tablet    Sig: Take 1 tablet (5 mg total) by mouth every 6 (six) hours as needed for severe pain.    Dispense:  120 tablet    Refill:  0    Do not place this medication, or any other prescription from our practice, on "Automatic Refill". Patient may have prescription filled one day early if pharmacy is closed on scheduled refill date. Do not fill until: 06/24/16 To last until: 07/24/16  . oxyCODONE (OXY IR/ROXICODONE) 5 MG immediate release tablet    Sig: Take 1 tablet (5 mg total) by mouth every 6 (six) hours as needed for severe pain.    Dispense:  120 tablet    Refill:  0    Do not place this  medication, or any other prescription from our practice, on "Automatic Refill". Patient may have prescription filled one day early if pharmacy is closed on scheduled refill date. Do not fill until: 07/24/16 To last until: 08/23/16  .  oxyCODONE (OXY IR/ROXICODONE) 5 MG immediate release tablet    Sig: Take 1 tablet (5 mg total) by mouth every 6 (six) hours as needed for severe pain.    Dispense:  120 tablet    Refill:  0    Do not place this medication, or any other prescription from our practice, on "Automatic Refill". Patient may have prescription filled one day early if pharmacy is closed on scheduled refill date. Do not fill until: 08/23/16 To last until: 09/22/16    [facility] & Procedure Ordered: Orders Placed This Encounter  Procedures  . CERVICAL EPIDURAL STEROID INJECTION  . THORACIC EPIDURAL STEROID INJECTION  . THORACIC FACET BLOCK  . LUMBAR EPIDURAL STEROID INJECTION  . LUMBAR FACET(MEDIAL BRANCH NERVE BLOCK) MBNB  . Lumbar Transforaminal epidural without steroid  . LUMBAR EPIDURAL STEROID INJECTION  . SACROILIAC JOINT INJECTINS  . KNEE INJECTION  . GENICULAR NERVE BLOCK  . ToxASSURE Select 13 (MW), Urine  . Comprehensive metabolic panel  . C-reactive protein  . Magnesium  . Sedimentation rate  . Vitamin B12  . 25-Hydroxyvitamin D Lcms D2+D3    Imaging Ordered: None  Interventional Therapies: Scheduled:  None at this time.    Considering:  1. Diagnostic right-sided cervical epidural steroid injection under fluoroscopic guidance, with or without sedation. 2. Diagnostic thoracic epidural steroid injection under fluoroscopic guidance, with or without sedation. 3. Diagnostic bilateral thoracic facet block under fluoroscopic guidance and IV sedation. 4. Possible bilateral thoracic facet radiofrequency ablation under fluoroscopic guidance and IV sedation. 5. Diagnostic bilateral lumbar facet block under fluoroscopic guidance and IV sedation. 6. Possible bilateral  lumbar facet radiofrequency ablation under fluoroscopic guidance and IV sedation.  7. Diagnostic right sacroiliac joint block under fluoroscopic guidance, with or without sedation.  8. Possible right sacroiliac joint radiofrequency ablation under fluoroscopic guidance and IV sedation.  9. Diagnostic caudal epidural steroid injection and epidurogram under fluoroscopic guidance, with or without sedation.  10. Diagnostic right-sided L4-5 transforaminal epidural steroid injection under fluoroscopic guidance, with or without sedation. 11. Diagnostic right intra-articular knee injection, without fluoroscopy or sedation.  12. Diagnostic right genicular nerve block under fluoroscopic guidance, with or without sedation.  13. Possible right genicular radiofrequency ablation under fluoroscopic guidance and IV sedation.    PRN Procedures:   1. Diagnostic right-sided cervical epidural steroid injection under fluoroscopic guidance, with or without sedation. 2. Diagnostic thoracic epidural steroid injection under fluoroscopic guidance, with or without sedation. 3. Diagnostic bilateral thoracic facet block under fluoroscopic guidance and IV sedation. 4. Diagnostic bilateral lumbar facet block under fluoroscopic guidance and IV sedation. 5. Diagnostic right sacroiliac joint block under fluoroscopic guidance, with or without sedation.   6. Diagnostic caudal epidural steroid injection and epidurogram under fluoroscopic guidance, with or without sedation.  7. Diagnostic right-sided L4-5 transforaminal epidural steroid injection under fluoroscopic guidance, with or without sedation. 8. Diagnostic right intra-articular knee injection, without fluoroscopy or sedation.  9. Diagnostic right genicular nerve block under fluoroscopic guidance, with or without sedation.    Referral(s) or Consult(s): None at this time.  New Prescriptions   No medications on file    Medications administered during this visit: Mr.  Mayeux had no medications administered during this visit.  Requested PM Follow-up: Return in about 3 months (around 09/07/2016) for Medication Management, (3-Mo), Procedure (PRN - Patient will call).  Future Appointments Date Time Provider [place]  09/16/2016 9:00 AM Milinda Pointer, MD [facility] None    Primary Care Physician: Ria Bush, MD Location: [facility] Outpatient Pain  Management Facility Note by: Kathlen Brunswick. Dossie Arbour, M.D, DABA, DABAPM, DABPM, DABIPP, FIPP  Pain Score Disclaimer: We use the NRS-11 scale. This is a self-reported, subjective measurement of pain severity with only modest accuracy. It is used primarily to identify changes within a particular patient. It must be understood that outpatient pain scales are significantly less accurate that those used for research, where they can be applied under ideal controlled circumstances with minimal exposure to variables. In reality, the score is likely to be a combination of pain intensity and pain affect, where pain affect describes the degree of emotional arousal or changes in action readiness caused by the sensory experience of pain. Factors such as social and work situation, setting, emotional state, anxiety levels, expectation, and prior pain experience may influence pain perception and show large inter-individual differences that may also be affected by time variables.  Patient instructions provided during this appointment: Patient Instructions  Instructed to get [facility]work [facility] mall.

## 2016-06-09 NOTE — Patient Instructions (Signed)
Instructed to [facility]. 

## 2016-06-10 DIAGNOSIS — G8929 Other chronic pain: Secondary | ICD-10-CM | POA: Insufficient documentation

## 2016-06-10 DIAGNOSIS — M47894 Other spondylosis, thoracic region: Secondary | ICD-10-CM | POA: Insufficient documentation

## 2016-06-10 DIAGNOSIS — M48061 Spinal stenosis, lumbar region without neurogenic claudication: Secondary | ICD-10-CM | POA: Insufficient documentation

## 2016-06-10 DIAGNOSIS — M5124 Other intervertebral disc displacement, thoracic region: Secondary | ICD-10-CM | POA: Insufficient documentation

## 2016-06-10 DIAGNOSIS — M5020 Other cervical disc displacement, unspecified cervical region: Secondary | ICD-10-CM | POA: Insufficient documentation

## 2016-06-10 DIAGNOSIS — M47814 Spondylosis without myelopathy or radiculopathy, thoracic region: Secondary | ICD-10-CM | POA: Insufficient documentation

## 2016-06-10 DIAGNOSIS — M4802 Spinal stenosis, cervical region: Secondary | ICD-10-CM | POA: Insufficient documentation

## 2016-06-10 DIAGNOSIS — M1711 Unilateral primary osteoarthritis, right knee: Secondary | ICD-10-CM | POA: Insufficient documentation

## 2016-06-10 DIAGNOSIS — M5126 Other intervertebral disc displacement, lumbar region: Secondary | ICD-10-CM | POA: Insufficient documentation

## 2016-06-10 DIAGNOSIS — M25561 Pain in right knee: Secondary | ICD-10-CM

## 2016-06-12 LAB — 25-HYDROXYVITAMIN D LCMS D2+D3
25-HYDROXY, VITAMIN D-2: 6.6 ng/mL
25-HYDROXY, VITAMIN D: 31 ng/mL

## 2016-06-12 LAB — 25-HYDROXY VITAMIN D LCMS D2+D3: 25-Hydroxy, Vitamin D-3: 24 ng/mL

## 2016-06-19 LAB — TOXASSURE SELECT 13 (MW), URINE: PDF: 0

## 2016-06-22 NOTE — Progress Notes (Signed)
Quick Note:   Low levels of sodium in blood is known as "Hyponatremia". When severe, symptoms can include: headaches; confusion or altered mental state; seizures; decreased consciousness which can proceed to coma and death. Less severe symptoms include: restlessness; muscle spasms or cramps; weakness, and tiredness. Causes may include: chronic conditions such as kidney failure (when excess fluid cannot be efficiently excreted) and congestive heart failure, in which excess fluid accumulates in the body. SIADH (syndrome of inappropriate anti-diuretic hormone) is a disease whereby the body produces too much anti-diuretic hormone (ADH), resulting in retention of water in the body. These are considered to be dilutional causes. In addition, it can also result when sodium is lost from the body as is the case during prolonged sweating and severe vomiting or diarrhoea. Medical conditions that can sometimes be associated with hyponatraemia include adrenal insufficiency, hypothyroidism, and cirrhosis of the liver. The eating disorder anorexia can also cause a sodium imbalance. Some drugs can lower blood sodium levels. Examples of these include diuretics (water tablets), desmopressin, and sulfonylureas.  Normal chloride levels are between 95 and 107 mEq/L. Low levels may be due to: Addison disease; Bartter syndrome; burns; congestive heart failure; dehydration; excessive sweating; hyperaldosteronism; metabolic alkalosis; respiratory acidosis (compensated); Syndrome of inappropriate diuretic hormone secretion (SIADH); or vomiting.  Normal fasting (NPO x 8 hours) glucose levels are between 65-99 mg/dl, with 2 hour fasting, levels are usually less than 140 mg/dl. Any random blood glucose level greater than 200 mg/dl is considered to be Diabetes.  Normal calcium levels are between 9.0 and 10.5 mg/dl. The most common cause of low total calcium is: Low blood protein levels, especially a low level of albumin, which can  result from liver disease or malnutrition, both of which may result from alcoholism or other illnesses. Low albumin is also very common in people who are acutely ill. With low albumin, only the bound calcium is low. Ionized calcium remains normal, and calcium metabolism is being regulated appropriately. Some other causes of hypocalcemia include: Underactive parathyroid gland (hypoparathyroidism); Inherited resistance to the effects of parathyroid hormone; Extreme deficiency in dietary calcium; Decreased levels of vitamin D; Magnesium deficiency; Increased levels of phosphorus; Acute inflammation of the pancreas (pancreatitis); & Renal failure.  While most low ALT level results indicate a normal healthy liver, that may not always be the case. A low-functioning or non-functioning liver, lacking normal levels of ALT activity to begin with, would not release a lot of ALT into the blood when damaged. People infected with the hepatitis C virus initially show high ALT levels in their blood, but these levels fall over time. Because the ALT test measures ALT levels at only one point in time, people with chronic hepatitis C infection may already have experienced the ALT peak well before blood was drawn for the ALT test. Urinary tract infections or malnutrition may also cause low blood ALT levels. ______

## 2016-06-24 ENCOUNTER — Encounter: Payer: Medicare Other | Admitting: Pain Medicine

## 2016-07-04 ENCOUNTER — Other Ambulatory Visit: Payer: Self-pay | Admitting: Family Medicine

## 2016-09-03 ENCOUNTER — Other Ambulatory Visit: Payer: Self-pay | Admitting: Family Medicine

## 2016-09-16 ENCOUNTER — Encounter: Payer: Medicare Other | Admitting: Pain Medicine

## 2016-09-22 ENCOUNTER — Encounter: Payer: Self-pay | Admitting: Pain Medicine

## 2016-09-22 ENCOUNTER — Ambulatory Visit: Payer: Medicare Other | Attending: Pain Medicine | Admitting: Pain Medicine

## 2016-09-22 VITALS — BP 149/95 | HR 80 | Temp 97.9°F | Ht 74.0 in | Wt 250.0 lb

## 2016-09-22 DIAGNOSIS — Z87891 Personal history of nicotine dependence: Secondary | ICD-10-CM | POA: Insufficient documentation

## 2016-09-22 DIAGNOSIS — E559 Vitamin D deficiency, unspecified: Secondary | ICD-10-CM | POA: Insufficient documentation

## 2016-09-22 DIAGNOSIS — M961 Postlaminectomy syndrome, not elsewhere classified: Secondary | ICD-10-CM | POA: Diagnosis not present

## 2016-09-22 DIAGNOSIS — Z888 Allergy status to other drugs, medicaments and biological substances status: Secondary | ICD-10-CM | POA: Insufficient documentation

## 2016-09-22 DIAGNOSIS — M549 Dorsalgia, unspecified: Secondary | ICD-10-CM | POA: Insufficient documentation

## 2016-09-22 DIAGNOSIS — G894 Chronic pain syndrome: Secondary | ICD-10-CM | POA: Insufficient documentation

## 2016-09-22 DIAGNOSIS — Z79899 Other long term (current) drug therapy: Secondary | ICD-10-CM | POA: Diagnosis not present

## 2016-09-22 DIAGNOSIS — M1288 Other specific arthropathies, not elsewhere classified, other specified site: Secondary | ICD-10-CM

## 2016-09-22 DIAGNOSIS — Z79891 Long term (current) use of opiate analgesic: Secondary | ICD-10-CM | POA: Diagnosis not present

## 2016-09-22 DIAGNOSIS — R790 Abnormal level of blood mineral: Secondary | ICD-10-CM | POA: Diagnosis not present

## 2016-09-22 DIAGNOSIS — M48061 Spinal stenosis, lumbar region without neurogenic claudication: Secondary | ICD-10-CM | POA: Diagnosis not present

## 2016-09-22 DIAGNOSIS — M4802 Spinal stenosis, cervical region: Secondary | ICD-10-CM | POA: Diagnosis not present

## 2016-09-22 DIAGNOSIS — M47894 Other spondylosis, thoracic region: Secondary | ICD-10-CM

## 2016-09-22 DIAGNOSIS — M47814 Spondylosis without myelopathy or radiculopathy, thoracic region: Secondary | ICD-10-CM

## 2016-09-22 DIAGNOSIS — F1190 Opioid use, unspecified, uncomplicated: Secondary | ICD-10-CM

## 2016-09-22 MED ORDER — OXYCODONE HCL 5 MG PO TABS: 5.0000 mg | ORAL_TABLET | Freq: Four times a day (QID) | ORAL | 0 refills | Status: DC | PRN

## 2016-09-22 MED ORDER — VITAMIN D3 50 MCG (2000 UT) PO CAPS: 2000.0000 [IU] | ORAL_CAPSULE | Freq: Every day | ORAL | 0 refills | Status: DC

## 2016-09-22 MED ORDER — MAGNESIUM OXIDE -MG SUPPLEMENT 500 MG PO CAPS: 1.0000 | ORAL_CAPSULE | Freq: Every day | ORAL | 0 refills | Status: DC

## 2016-09-22 NOTE — Progress Notes (Signed)
Safety precautions to be maintained throughout the outpatient stay will include: orient to surroundings, keep bed in low position, maintain call bell within reach at all times, provide assistance with transfer out of bed and ambulation.  Bottle labeled oxycodone 5 mg #0/120  Filled 08-23-16

## 2016-09-22 NOTE — Progress Notes (Signed)
Patient's Name: Larry Goodwin  MRN: [id number]  Referring Provider: Ria Bush, MD  DOB: 1976/08/18  PCP: Ria Bush, MD  DOS: 09/22/2016  Note by: Kathlen Brunswick. Dossie Arbour, MD  Service setting: Ambulatory outpatient  Specialty: Interventional Pain Management  Location: [facility] (AMB) Pain Management Facility    Patient type: Established   Primary Reason(s) for Visit: Encounter for prescription drug management (Level of risk: moderate) CC: Back Pain (mid to lower)  HPI  Larry Goodwin is a 40 y.o. year old, male patient, who comes today for an initial evaluation. He has Lumbar DDD (degenerative disc disease); HTN (hypertension); Bell's palsy; Severe obesity (BMI >= 40) ([place]); Chronic pain syndrome; Medicare annual wellness visit, initial; Hyperglycemia; History of tobacco abuse; Low magnesium levels; Vitamin D deficiency; Myofascial pain syndrome; Lumbar facet syndrome (Bilateral) (R>L); Chronic upper back pain; Generalized anxiety disorder; Depression; Lumbar spondylosis with radiculopathy; Epidural fibrosis; Failed back surgical syndrome; Encounter for therapeutic drug level monitoring; Long term prescription opiate use; Long term current use of opiate analgesic; Opiate use (30 MME/Day); Chronic low back pain (Bilateral) (R>L); Chronic pain; Chronic sacroiliac joint pain (Right); Abnormal MRI, lumbar spine;  Abnormal CT myelogram of the thoracic spine; Thoracic disc herniation; Bulge of lumbar disc without myelopathy; Vertebral body hemangioma; Disturbance of skin sensation; Cervical IVDD (intervertebral disc displacement); Cervical foraminal stenosis (C5-6 and C7-T1: Right); Cervical central spinal stenosis (C5-6); Thoracic IVDD (intervertebral disc displacement); Thoracic facet arthropathy; Thoracic facet syndrome; Lumbar IVDD (intervertebral disc displacement); Lumbar foraminal stenosis (Right L4-5); Lumbar central spinal stenosis (L3-4 and L4-5); Chronic knee pain (Right); and Osteoarthritis of  knee (Right) on his problem list.. His primarily concern today is the Back Pain (mid to lower)  Pain Assessment: Self-Reported Pain Score: 6 /10 Clinically the patient looks like a 1/10 Reported level is inconsistent with clinical observations. Information on the proper use of the pain score provided to the patient today. Pain Type: Chronic pain Pain Location: Back Pain Orientation: Mid, Lower, Right Pain Descriptors / Indicators: Dull Pain Frequency: Constant  The patient comes into the clinics today for pharmacological management of his chronic pain. I last saw this patient on 06/09/2016. The patient  reports that he does not use drugs. His body mass index is 32.1 kg/m.  Date of Last Visit: 06/09/16 Service Provided on Last Visit: Med Refill  Controlled Substance Pharmacotherapy Assessment & REMS (Risk Evaluation and Mitigation Strategy)  Analgesic: Oxycodone IR 5 mg every 6 hours (4 per day) (20 mg/day) MME/day: 30 mg/day.  Pill Count: Bottle labeled oxycodone 5 mg #0/120  Filled 08-23-16. Pharmacokinetics: Onset of action (Liberation/Absorption): Within expected pharmacological parameters Time to Peak effect (Distribution): Timing and results are as within normal expected parameters Duration of action (Metabolism/Excretion): Within normal limits for medication Pharmacodynamics: Analgesic Effect: More than 50% Activity Facilitation: Medication(s) allow patient to sit, stand, walk, and do the basic ADLs Perceived Effectiveness: Described as relatively effective, allowing for increase in activities of daily living (ADL) Side-effects or Adverse reactions: None reported Monitoring: [place] PMP: Online review of the past 39-month period conducted. Compliant with practice rules and regulations List of all UDS test(s) done:  Lab Results  Component Value Date   TOXASSSELUR FINAL 06/09/2016   TOXASSSELUR FINAL 03/25/2016   TOXASSSELUR FINAL 12/30/2015   Last UDS on record: ToxAssure Select  13  Date Value Ref Range Status  06/09/2016 FINAL  Final    Comment:    ==================================================================== TOXASSURE SELECT 13 (MW) ==================================================================== Test  Result       Flag       Units Drug Present and Declared for Prescription Verification   Oxycodone                      216          EXPECTED   ng/mg creat   Oxymorphone                    493          EXPECTED   ng/mg creat   Noroxycodone                   781          EXPECTED   ng/mg creat   Noroxymorphone                 198          EXPECTED   ng/mg creat    Sources of oxycodone are scheduled prescription medications.    Oxymorphone, noroxycodone, and noroxymorphone are expected    metabolites of oxycodone. Oxymorphone is also available as a    scheduled prescription medication. ==================================================================== Test                      Result    Flag   Units      Ref Range   Creatinine              81               mg/dL      >=20 ==================================================================== Declared Medications:  The flagging and interpretation on this report are based on the  following declared medications.  Unexpected results may arise from  inaccuracies in the declared medications.  **Note: The testing scope of this panel includes these medications:  Oxycodone  **Note: The testing scope of this panel does not include following  reported medications:  Cholecalciferol  Lisinopril  Magnesium (Mag) ==================================================================== For clinical consultation, please call [phone]. ====================================================================    UDS interpretation: Compliant          Medication Assessment Form: Reviewed. Patient indicates being compliant with therapy Treatment compliance: Compliant Risk  Assessment: Aberrant Behavior: None observed today Substance Use Disorder (SUD) Risk Level: No change since last visit Risk of opioid abuse or dependence: 0.7-3.0% with doses ? 36 MME/day and 6.1-26% with doses ? 120 MME/day. Opioid Risk Tool (ORT) Score: 0   Low Risk for SUD (Score <3) Depression Scale Score: PHQ-2: 0   No depression (0) PHQ-9: 0   No depression (0-4)  Pharmacologic Plan: No change in therapy, at this time  Laboratory Chemistry  Inflammation Markers Lab Results  Component Value Date   ESRSEDRATE 6 06/09/2016   CRP 0.8 06/09/2016   Renal Function Lab Results  Component Value Date   BUN 9 06/09/2016   CREATININE 0.68 06/09/2016   GFRAA >60 06/09/2016   GFRNONAA >60 06/09/2016   Hepatic Function Lab Results  Component Value Date   AST 15 06/09/2016   ALT 15 (L) 06/09/2016   ALBUMIN 4.2 06/09/2016   Electrolytes Lab Results  Component Value Date   NA 134 (L) 06/09/2016   K 4.3 06/09/2016   CL 99 (L) 06/09/2016   CALCIUM 8.8 (L) 06/09/2016   MG 1.7 06/09/2016   Pain Modulating Vitamins Lab Results  Component Value Date   25OHVITD1 31 06/09/2016   25OHVITD2 6.6 06/09/2016  25OHVITD3 24 06/09/2016   VITAMINB12 297 06/09/2016   Coagulation Parameters Lab Results  Component Value Date   PLT 240 08/03/2014   Cardiovascular Lab Results  Component Value Date   HGB 14.9 08/03/2014   HCT 43.9 08/03/2014   Note: Lab results reviewed.  Recent Diagnostic Imaging  No results found. Meds  The patient has a current medication list which includes the following prescription(s): vitamin d3, lisinopril, magnesium oxide, oxycodone, oxycodone, and oxycodone.  Current Outpatient Prescriptions on File Prior to Visit  Medication Sig  . lisinopril (PRINIVIL,ZESTRIL) 40 MG tablet TAKE 1 TABLET (40 MG TOTAL) BY MOUTH DAILY.   No current facility-administered medications on file prior to visit.    ROS  Constitutional: Denies any fever or  chills Gastrointestinal: No reported hemesis, hematochezia, vomiting, or acute GI distress Musculoskeletal: Denies any acute onset joint swelling, redness, loss of ROM, or weakness Neurological: No reported episodes of acute onset apraxia, aphasia, dysarthria, agnosia, amnesia, paralysis, loss of coordination, or loss of consciousness  Allergies  Mr. Martignetti is allergic to levaquin [levofloxacin in d5w].  [place]  Medical:  Mr. Gamlin  has a past medical history of Bell's palsy (2015); Chronic pain syndrome; DDD (degenerative disc disease), lumbar; HTN (hypertension); and Morbid obesity ([place]) (08/16/2014). Family: family history includes COPD in his mother; Hypertension in his other. Surgical:  has a past surgical history that includes Back surgery (2012) and Cholecystectomy. Tobacco:  reports that he quit smoking about 8 years ago. He has never used smokeless tobacco. Alcohol:  reports that he does not drink alcohol. Drug:  reports that he does not use drugs.  Constitutional Exam  General appearance: Well nourished, well developed, and well hydrated. In no acute distress Vitals:   09/22/16 1417 09/22/16 1418  BP:  (!) 149/95  Pulse: 80   Temp: 97.9 F (36.6 C)   Weight: 250 lb (113.4 kg)   Height: 6\' 2"  (1.88 m)   BMI Assessment: Estimated body mass index is 32.1 kg/m as calculated from the following:   Height as of this encounter: 6\' 2"  (1.88 m).   Weight as of this encounter: 250 lb (113.4 kg).   BMI interpretation: (30-34.9 kg/m2) = Obese (Class I): This range is associated with a 68% higher incidence of chronic pain. BMI Readings from Last 4 Encounters:  09/22/16 32.10 kg/m  06/09/16 35.95 kg/m  03/25/16 38.90 kg/m  12/30/15 41.09 kg/m   Wt Readings from Last 4 Encounters:  09/22/16 250 lb (113.4 kg)  06/09/16 280 lb (127 kg)  03/25/16 (!) 303 lb (137.4 kg)  12/30/15 (!) 320 lb (145.2 kg)  Psych/Mental status: Alert and oriented x 3 (person, place, & time) Eyes:  PERLA Respiratory: No evidence of acute respiratory distress  Cervical Spine Exam  Inspection: No masses, redness, or swelling Alignment: Symmetrical Functional ROM: Unrestricted ROM Stability: No instability detected Muscle strength & Tone: Functionally intact Sensory: Unimpaired Palpation: Non-contributory  Upper Extremity (UE) Exam    Side: Right upper extremity  Side: Left upper extremity  Inspection: No masses, redness, swelling, or asymmetry  Inspection: No masses, redness, swelling, or asymmetry  Functional ROM: Unrestricted ROM         Functional ROM: Unrestricted ROM          Muscle strength & Tone: Functionally intact  Muscle strength & Tone: Functionally intact  Sensory: Unimpaired  Sensory: Unimpaired  Palpation: Non-contributory  Palpation: Non-contributory   Thoracic Spine Exam  Inspection: No masses, redness, or swelling Alignment: Symmetrical Functional ROM:  Unrestricted ROM Stability: No instability detected Sensory: Unimpaired Muscle strength & Tone: Functionally intact Palpation: Non-contributory  Lumbar Spine Exam  Inspection: No masses, redness, or swelling Alignment: Symmetrical Functional ROM: Diminished ROM Stability: No instability detected Muscle strength & Tone: Functionally intact Sensory: Movement-associated discomfort Palpation: Non-contributory Provocative Tests: Lumbar Hyperextension and rotation test: evaluation deferred today       Patrick's Maneuver: evaluation deferred today              Gait & Posture Assessment  Ambulation: Unassisted Gait: Relatively normal for age and body habitus Posture: WNL   Lower Extremity Exam    Side: Right lower extremity  Side: Left lower extremity  Inspection: No masses, redness, swelling, or asymmetry  Inspection: No masses, redness, swelling, or asymmetry  Functional ROM: Unrestricted ROM          Functional ROM: Unrestricted ROM          Muscle strength & Tone: Functionally intact  Muscle strength  & Tone: Functionally intact  Sensory: Unimpaired  Sensory: Unimpaired  Palpation: Non-contributory  Palpation: Non-contributory   Assessment  Primary Diagnosis & Pertinent Problem List: The primary encounter diagnosis was Failed back surgical syndrome. Diagnoses of Chronic pain syndrome, Low magnesium levels, Vitamin D deficiency, Cervical central spinal stenosis (C5-6), Thoracic facet syndrome, Opiate use (30 MME/Day), and Long term current use of opiate analgesic were also pertinent to this visit.  Visit Diagnosis: 1. Failed back surgical syndrome   2. Chronic pain syndrome   3. Low magnesium levels   4. Vitamin D deficiency   5. Cervical central spinal stenosis (C5-6)   6. Thoracic facet syndrome   7. Opiate use (30 MME/Day)   8. Long term current use of opiate analgesic    Plan of Care  Pharmacotherapy (Medications Ordered): Meds ordered this encounter  Medications  . oxyCODONE (OXY IR/ROXICODONE) 5 MG immediate release tablet    Sig: Take 1 tablet (5 mg total) by mouth every 6 (six) hours as needed for severe pain.    Dispense:  120 tablet    Refill:  0    Do not place this medication, or any other prescription from our practice, on "Automatic Refill". Patient may have prescription filled one day early if pharmacy is closed on scheduled refill date. Do not fill until: 09/22/16 To last until: 10/22/16  . oxyCODONE (OXY IR/ROXICODONE) 5 MG immediate release tablet    Sig: Take 1 tablet (5 mg total) by mouth every 6 (six) hours as needed for severe pain.    Dispense:  120 tablet    Refill:  0    Do not place this medication, or any other prescription from our practice, on "Automatic Refill". Patient may have prescription filled one day early if pharmacy is closed on scheduled refill date. Do not fill until: 10/22/16 To last until: 11/21/16  . oxyCODONE (OXY IR/ROXICODONE) 5 MG immediate release tablet    Sig: Take 1 tablet (5 mg total) by mouth every 6 (six) hours as needed for  severe pain.    Dispense:  120 tablet    Refill:  0    Do not place this medication, or any other prescription from our practice, on "Automatic Refill". Patient may have prescription filled one day early if pharmacy is closed on scheduled refill date. Do not fill until: 11/21/16 To last until: 12/21/16  . Magnesium Oxide 500 MG CAPS    Sig: Take 1 capsule (500 mg total) by mouth daily.    Dispense:  90 capsule    Refill:  0    Do not place this medication, or any other prescription from our practice, on "Automatic Refill". Patient may have prescription filled one day early if pharmacy is closed on scheduled refill date.  . Cholecalciferol (VITAMIN D3) 2000 units capsule    Sig: Take 1 capsule (2,000 Units total) by mouth daily.    Dispense:  90 capsule    Refill:  0    Do not place this medication, or any other prescription from our practice, on "Automatic Refill".   New Prescriptions   No medications on file   Medications administered during this visit: Mr. Sagal had no medications administered during this visit. Lab-work, Procedure(s), & Referral(s) Ordered: No orders of the defined types were placed in this encounter.  Imaging & Referral(s) Ordered: None  Interventional Therapies: Scheduled:  None at this time.    Considering:  Diagnostic right-sided cervical epidural steroid injection under fluoroscopic guidance, with or without sedation. Diagnostic thoracic epidural steroid injection under fluoroscopic guidance, with or without sedation. Diagnostic bilateral thoracic facet block under fluoroscopic guidance and IV sedation. Possible bilateral thoracic facet radiofrequency ablation under fluoroscopic guidance and IV sedation. Diagnostic bilateral lumbar facet block under fluoroscopic guidance and IV sedation. Possible bilateral lumbar facet radiofrequency ablation under fluoroscopic guidance and IV sedation.  Diagnostic right sacroiliac joint block under fluoroscopic  guidance, with or without sedation.  Possible right sacroiliac joint radiofrequency ablation under fluoroscopic guidance and IV sedation.  Diagnostic caudal epidural steroid injection and epidurogram under fluoroscopic guidance, with or without sedation.  Diagnostic right-sided L4-5 transforaminal epidural steroid injection under fluoroscopic guidance, with or without sedation. Diagnostic right intra-articular knee injection, without fluoroscopy or sedation.  Diagnostic right genicular nerve block under fluoroscopic guidance, with or without sedation.  Possible right genicular radiofrequency ablation under fluoroscopic guidance and IV sedation.    PRN Procedures:   Diagnostic right-sided cervical epidural steroid injection under fluoroscopic guidance, with or without sedation. Diagnostic thoracic epidural steroid injection under fluoroscopic guidance, with or without sedation. Diagnostic bilateral thoracic facet block under fluoroscopic guidance and IV sedation. Diagnostic bilateral lumbar facet block under fluoroscopic guidance and IV sedation. Diagnostic right sacroiliac joint block under fluoroscopic guidance, with or without sedation.   Diagnostic caudal epidural steroid injection and epidurogram under fluoroscopic guidance, with or without sedation.  Diagnostic right-sided L4-5 transforaminal epidural steroid injection under fluoroscopic guidance, with or without sedation. Diagnostic right intra-articular knee injection, without fluoroscopy or sedation.  Diagnostic right genicular nerve block under fluoroscopic guidance, with or without sedation.    Requested PM Follow-up: Return in 3 months (on 12/23/2016) for Med-Mgmt.  Future Appointments Date Time Provider [place]  12/15/2016 1:15 PM Milinda Pointer, MD [facility] None   Primary Care Physician: Ria Bush, MD Location: [facility] Note by: Kathlen Brunswick. Dossie Arbour, M.D, DABA, DABAPM,  DABPM, DABIPP, FIPP  Pain Score Disclaimer: We use the NRS-11 scale. This is a self-reported, subjective measurement of pain severity with only modest accuracy. It is used primarily to identify changes within a particular patient. It must be understood that outpatient pain scales are significantly less accurate that those used for research, where they can be applied under ideal controlled circumstances with minimal exposure to variables. In reality, the score is likely to be a combination of pain intensity and pain affect, where pain affect describes the degree of emotional arousal or changes in action readiness caused by the sensory experience of pain. Factors such as social and  work situation, setting, emotional state, anxiety levels, expectation, and prior pain experience may influence pain perception and show large inter-individual differences that may also be affected by time variables.  Patient instructions provided during this appointment: There are no Patient Instructions on file for this visit.

## 2016-10-05 ENCOUNTER — Other Ambulatory Visit: Payer: Self-pay | Admitting: Family Medicine

## 2016-11-02 ENCOUNTER — Other Ambulatory Visit: Payer: Self-pay | Admitting: Family Medicine

## 2016-11-04 ENCOUNTER — Telehealth: Payer: Self-pay | Admitting: Family Medicine

## 2016-11-04 NOTE — Telephone Encounter (Signed)
LVM for pt to call back and schedule AWV + labs with Lesia and OV 30 with PCP. °

## 2016-11-19 NOTE — Progress Notes (Signed)
Patient's Name: Larry Goodwin  MRN: [id number]  Referring Provider: Ria Bush, MD  DOB: Nov 30, 1976  PCP: Ria Bush, MD  DOS: 11/20/2016  Note by: Kathlen Brunswick. Dossie Arbour, MD  Service setting: Ambulatory outpatient  Specialty: Interventional Pain Management  Location: [facility] (AMB) Pain Management Facility    Patient type: Established   Primary Reason(s) for Visit: Encounter for prescription drug management (Level of risk: moderate) CC: Back Pain (lower)  HPI  Larry Goodwin is a 39 y.o. year old, male patient, who comes today for a medication management evaluation. He has Lumbar DDD (degenerative disc disease); HTN (hypertension); Bell's palsy; Severe obesity (BMI >= 40) ([place]); Chronic pain syndrome; Medicare annual wellness visit, initial; Hyperglycemia; History of tobacco abuse; Low magnesium levels; Vitamin D deficiency; Myofascial pain syndrome; Lumbar facet syndrome (Bilateral) (R>L); Chronic upper back pain; Generalized anxiety disorder; Depression; Lumbar spondylosis with radiculopathy; Epidural fibrosis; Failed back surgical syndrome; Encounter for therapeutic drug level monitoring; Long term prescription opiate use; Long term current use of opiate analgesic; Opiate use (30 MME/Day); Chronic low back pain (Bilateral) (R>L); Chronic sacroiliac joint pain (Right); Abnormal MRI, lumbar spine;  Abnormal CT myelogram of the thoracic spine; Thoracic disc herniation; Bulge of lumbar disc without myelopathy; Vertebral body hemangioma; Disturbance of skin sensation; Cervical IVDD (intervertebral disc displacement); 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 His primarily concern today is the Back Pain (lower)  Pain Assessment: Self-Reported Pain Score: 4 /10             Reported level is compatible with observation.       Pain Type: Chronic pain Pain Location: Back Pain Orientation: Lower Pain Descriptors / Indicators: Aching, Constant, Radiating Pain Frequency: Constant  Larry Goodwin was last seen on 09/22/2016 for medication management. During today's appointment we reviewed Larry Goodwin's chronic pain status, as well as his outpatient medication regimen.  The patient  reports that he does not use drugs. His body mass index is 33.25 kg/m.  Further details on both, my assessment(s), as well as the proposed treatment plan, please see below.  Controlled Substance Pharmacotherapy Assessment REMS (Risk Evaluation and Mitigation Strategy)  Analgesic:Oxycodone IR 5 mg every 6 hours (4 per day) (20 mg/day) MME/day:30 mg/day.  Evon Slack, RN  11/20/2016  9:09 AM  Sign at close encounter Nursing Pain Medication Assessment:  Safety precautions to be maintained throughout the outpatient stay will include: orient to surroundings, keep bed in low position, maintain call bell within reach at all times, provide assistance with transfer out of bed and ambulation.  Medication Inspection Compliance: Pill count conducted under aseptic conditions, in front of the patient. Neither the pills nor the bottle was removed from the patient's sight at any time. Once count was completed pills were immediately returned to the patient in their original bottle.  Medication: Oxycodone IR Pill Count: 0.5 of 120 pills remain Bottle Appearance: Standard pharmacy container. Clearly labeled. Filled Date: 31 / 09 / 2017 Medication last intake: 11/20/2016   Pharmacokinetics: Liberation and absorption (onset of action): WNL Distribution (time to peak effect): WNL Metabolism and excretion (duration of action): WNL          Pharmacodynamics: Desired effects: Analgesia: Larry Goodwin reports >50% benefit. Functional ability: Patient  reports that medication allows him to accomplish basic ADLs Clinically meaningful improvement in function (CMIF): Sustained CMIF goals met Perceived effectiveness: Described as relatively effective, allowing for increase in activities of daily living (ADL) Undesirable effects: Side-effects or Adverse reactions: None reported Monitoring: [place] PMP: Online review of the past 48-monthperiod conducted. Compliant with practice rules and regulations List of all UDS test(s) done:  Lab Results  Component Value Date   TOXASSSELUR FINAL 06/09/2016   TOXASSSELUR FINAL 03/25/2016   TOXASSSELUR FINAL 12/30/2015   Last UDS on record: ToxAssure Select 13  Date Value Ref Range Status  06/09/2016 FINAL  Final    Comment:    ==================================================================== TOXASSURE SELECT 13 (MW) ==================================================================== Test                             Result       Flag       Units Drug Present and Declared for Prescription Verification   Oxycodone                      216          EXPECTED   ng/mg creat   Oxymorphone                    493          EXPECTED   ng/mg creat   Noroxycodone                   781          EXPECTED   ng/mg creat   Noroxymorphone                 198          EXPECTED   ng/mg creat    Sources of oxycodone are scheduled prescription medications.    Oxymorphone, noroxycodone, and noroxymorphone are expected    metabolites of oxycodone. Oxymorphone is also available as a    scheduled prescription medication. ==================================================================== Test                      Result    Flag   Units      Ref Range   Creatinine              81               mg/dL      >=20 ==================================================================== Declared Medications:  The flagging  and interpretation on this report are based on the  following declared medications.  Unexpected results may arise from  inaccuracies in the declared medications.  **Note: The testing scope of this panel includes these medications:  Oxycodone  **Note: The testing scope of this panel does not include following  reported medications:  Cholecalciferol  Lisinopril  Magnesium (Mag) ==================================================================== For clinical consultation, please call [phone] ====================================================================    UDS interpretation: Compliant          Medication Assessment Form: Reviewed. Patient indicates being compliant with therapy Treatment compliance: Compliant Risk Assessment Profile: Aberrant behavior: See prior evaluations. None observed or detected today Comorbid factors increasing risk of overdose: See prior notes. No additional risks detected today Risk of substance use disorder (SUD): Low Opioid Risk Tool (ORT) Total Score: 0  Interpretation Table:  Score <3 = Low Risk for SUD  Score between 4-7 = Moderate Risk for SUD  Score >8 = High  Risk for Opioid Abuse   Risk Mitigation Strategies:  Patient Counseling: Covered Patient-Prescriber Agreement (PPA): Present and active  Notification to other healthcare providers: Done  Pharmacologic Plan: No change in therapy, at this time  Laboratory Chemistry  Inflammation Markers Lab Results  Component Value Date   ESRSEDRATE 6 06/09/2016   CRP 0.8 06/09/2016   Renal Function Lab Results  Component Value Date   BUN 9 06/09/2016   CREATININE 0.68 06/09/2016   GFRAA >60 06/09/2016   GFRNONAA >60 06/09/2016   Hepatic Function Lab Results  Component Value Date   AST 15 06/09/2016   ALT 15 (L) 06/09/2016   ALBUMIN 4.2 06/09/2016   Electrolytes Lab Results  Component Value Date   NA 134 (L) 06/09/2016   K 4.3 06/09/2016   CL 99 (L) 06/09/2016   CALCIUM 8.8  (L) 06/09/2016   MG 1.7 06/09/2016   Pain Modulating Vitamins Lab Results  Component Value Date   25OHVITD1 31 06/09/2016   25OHVITD2 6.6 06/09/2016   25OHVITD3 24 06/09/2016   VITAMINB12 297 06/09/2016   Coagulation Parameters Lab Results  Component Value Date   PLT 240 08/03/2014   Cardiovascular Lab Results  Component Value Date   HGB 14.9 08/03/2014   HCT 43.9 08/03/2014   Note: Lab results reviewed.  Recent Diagnostic Imaging Review  No results found. Note: Imaging results reviewed.          Meds  The patient has a current medication list which includes the following prescription(s): vitamin d3, lisinopril, magnesium oxide, oxycodone, oxycodone, and oxycodone.  Current Outpatient Prescriptions on File Prior to Visit  Medication Sig  . Cholecalciferol (VITAMIN D3) 2000 units capsule Take 1 capsule (2,000 Units total) by mouth daily.  Marland Kitchen lisinopril (PRINIVIL,ZESTRIL) 40 MG tablet TAKE 1 TABLET (40 MG TOTAL) BY MOUTH DAILY. **MUST HAVE PHYSICAL FOR FURTHER REFILLS**  . Magnesium Oxide 500 MG CAPS Take 1 capsule (500 mg total) by mouth daily.  Derrill Memo ON 11/21/2016] oxyCODONE (OXY IR/ROXICODONE) 5 MG immediate release tablet Take 1 tablet (5 mg total) by mouth every 6 (six) hours as needed for severe pain.   No current facility-administered medications on file prior to visit.    ROS  Constitutional: Denies any fever or chills Gastrointestinal: No reported hemesis, hematochezia, vomiting, or acute GI distress Musculoskeletal: Denies any acute onset joint swelling, redness, loss of ROM, or weakness Neurological: No reported episodes of acute onset apraxia, aphasia, dysarthria, agnosia, amnesia, paralysis, loss of coordination, or loss of consciousness  Allergies  Mr. Rogus is allergic to levaquin [levofloxacin in d5w].  [place]  Drug: Mr. Eggebrecht  reports that he does not use drugs. Alcohol:  reports that he does not drink alcohol. Tobacco:  reports that he quit  smoking about 8 years ago. He has never used smokeless tobacco. Medical:  has a past medical history of Bell's palsy (2015); Chronic pain syndrome; DDD (degenerative disc disease), lumbar; HTN (hypertension); and Morbid obesity ([place]) (08/16/2014). Family: family history includes COPD in his mother; Hypertension in his other.  Past Surgical History:  Procedure Laterality Date  . BACK SURGERY  2012   x 2 [place])  . CHOLECYSTECTOMY     Constitutional Exam  General appearance: Well nourished, well developed, and well hydrated. In no apparent acute distress Vitals:   11/20/16 0856 11/20/16 0858  BP: (!) 149/89   Pulse: 95   Resp: 16   Temp: 97.7 F (36.5 C)   TempSrc: Oral   SpO2: 100%  Weight: 260 lb (117.9 kg) 266 lb (120.7 kg)  Height: '6\' 3"'  (1.905 m)    BMI Assessment: Estimated body mass index is 33.25 kg/m as calculated from the following:   Height as of this encounter: '6\' 3"'  (1.905 m).   Weight as of this encounter: 266 lb (120.7 kg).  BMI interpretation table: BMI level Category Range association with higher incidence of chronic pain  <18 kg/m2 Underweight   18.5-24.9 kg/m2 Ideal body weight   25-29.9 kg/m2 Overweight Increased incidence by 20%  30-34.9 kg/m2 Obese (Class I) Increased incidence by 68%  35-39.9 kg/m2 Severe obesity (Class II) Increased incidence by 136%  >40 kg/m2 Extreme obesity (Class III) Increased incidence by 254%   BMI Readings from Last 4 Encounters:  11/20/16 33.25 kg/m  09/22/16 32.10 kg/m  06/09/16 35.95 kg/m  03/25/16 38.90 kg/m   Wt Readings from Last 4 Encounters:  11/20/16 266 lb (120.7 kg)  09/22/16 250 lb (113.4 kg)  06/09/16 280 lb (127 kg)  03/25/16 (!) 303 lb (137.4 kg)  Psych/Mental status: Alert, oriented x 3 (person, place, & time) Eyes: PERLA Respiratory: No evidence of acute respiratory distress  Cervical Spine Exam  Inspection: No masses, redness, or swelling Alignment: Symmetrical Functional ROM: Unrestricted  ROM Stability: No instability detected Muscle strength & Tone: Functionally intact Sensory: Unimpaired Palpation: Non-contributory  Upper Extremity (UE) Exam    Side: Right upper extremity  Side: Left upper extremity  Inspection: No masses, redness, swelling, or asymmetry  Inspection: No masses, redness, swelling, or asymmetry  Functional ROM: Unrestricted ROM          Functional ROM: Unrestricted ROM          Muscle strength & Tone: Functionally intact  Muscle strength & Tone: Functionally intact  Sensory: Unimpaired  Sensory: Unimpaired  Palpation: Non-contributory  Palpation: Non-contributory   Thoracic Spine Exam  Inspection: No masses, redness, or swelling Alignment: Symmetrical Functional ROM: Unrestricted ROM Stability: No instability detected Sensory: Unimpaired Muscle strength & Tone: Functionally intact Palpation: Non-contributory  Lumbar Spine Exam  Inspection: No masses, redness, or swelling Alignment: Symmetrical Functional ROM: Unrestricted ROM Stability: No instability detected Muscle strength & Tone: Functionally intact Sensory: Unimpaired Palpation: Non-contributory Provocative Tests: Lumbar Hyperextension and rotation test: evaluation deferred today       Patrick's Maneuver: evaluation deferred today              Gait & Posture Assessment  Ambulation: Unassisted Gait: Relatively normal for age and body habitus Posture: WNL   Lower Extremity Exam    Side: Right lower extremity  Side: Left lower extremity  Inspection: No masses, redness, swelling, or asymmetry  Inspection: No masses, redness, swelling, or asymmetry  Functional ROM: Unrestricted ROM          Functional ROM: Unrestricted ROM          Muscle strength & Tone: Functionally intact  Muscle strength & Tone: Functionally intact  Sensory: Unimpaired  Sensory: Unimpaired  Palpation: Non-contributory  Palpation: Non-contributory   Assessment  Primary Diagnosis & Pertinent Problem List: The  primary encounter diagnosis was Chronic pain syndrome. Diagnoses of Long term current use of opiate analgesic and Opiate use (30 MME/Day) were also pertinent to this visit.  Visit Diagnosis: 1. Chronic pain syndrome   2. Long term current use of opiate analgesic   3. Opiate use (30 MME/Day)    Plan of Care  Pharmacotherapy (Medications Ordered): Meds ordered this encounter  Medications  . oxyCODONE (OXY IR/ROXICODONE) 5 MG  immediate release tablet    Sig: Take 1 tablet (5 mg total) by mouth every 6 (six) hours as needed for severe pain.    Dispense:  120 tablet    Refill:  0    Do not place this medication, or any other prescription from our practice, on "Automatic Refill". Patient may have prescription filled one day early if pharmacy is closed on scheduled refill date. Do not fill until: 12/21/16 To last until: 01/20/17  . oxyCODONE (OXY IR/ROXICODONE) 5 MG immediate release tablet    Sig: Take 1 tablet (5 mg total) by mouth every 6 (six) hours as needed for severe pain.    Dispense:  120 tablet    Refill:  0    Do not place this medication, or any other prescription from our practice, on "Automatic Refill". Patient may have prescription filled one day early if pharmacy is closed on scheduled refill date. Do not fill until: 01/20/17 To last until: 02/19/17   New Prescriptions   No medications on file   Medications administered today: Mr. Breeze had no medications administered during this visit. Lab-work, procedure(s), and/or referral(s): No orders of the defined types were placed in this encounter.  Imaging and/or referral(s): None  Interventional therapies: Planned, scheduled, and/or pending:   None at this time    Considering:   Diagnostic right-sided cervical epidural steroid injection under fluoroscopic guidance, with or without sedation. Diagnostic thoracic epidural steroid injection under fluoroscopic guidance, with or without sedation. Diagnostic bilateral  thoracic facet block under fluoroscopic guidance and IV sedation. Possible bilateral thoracic facet radiofrequency ablation under fluoroscopic guidance and IV sedation. Diagnostic bilateral lumbar facet block under fluoroscopic guidance and IV sedation. Possible bilateral lumbar facet radiofrequency ablation under fluoroscopic guidance and IV sedation.  Diagnostic right sacroiliac joint block under fluoroscopic guidance, with or without sedation.  Possible right sacroiliac joint radiofrequency ablation under fluoroscopic guidance and IV sedation.  Diagnostic caudal epidural steroid injection and epidurogram under fluoroscopic guidance, with or without sedation.  Diagnostic right-sided L4-5 transforaminal epidural steroid injection under fluoroscopic guidance, with or without sedation. Diagnostic right intra-articular knee injection, without fluoroscopy or sedation.  Diagnostic right genicular nerve block under fluoroscopic guidance, with or without sedation.  Possible right genicular radiofrequency ablation under fluoroscopic guidance and IV sedation.    Palliative PRN treatment(s):   Diagnostic right-sided cervical epidural steroid injection under fluoroscopic guidance, with or without sedation. Diagnostic thoracic epidural steroid injection under fluoroscopic guidance, with or without sedation. Diagnostic bilateral thoracic facet block under fluoroscopic guidance and IV sedation. Diagnostic bilateral lumbar facet block under fluoroscopic guidance and IV sedation. Diagnostic right sacroiliac joint block under fluoroscopic guidance, with or without sedation.  Diagnostic caudal epidural steroid injection and epidurogram under fluoroscopic guidance, with or without sedation.  Diagnostic right-sided L4-5 transforaminal epidural steroid injection under fluoroscopic guidance, with or without sedation. Diagnostic right intra-articular knee injection, without fluoroscopy or sedation.  Diagnostic  right genicular nerve block under fluoroscopic guidance, with or without sedation.    Provider-requested follow-up: Return in about 3 months (around 02/09/2017) for Med-Mgmt.  Future Appointments Date Time Provider [place]  02/18/2017 8:45 AM Milinda Pointer, MD [facility] None   Primary Care Physician: Ria Bush, MD Location: [facility] Outpatient Pain Management Facility Note by: Kathlen Brunswick. Dossie Arbour, M.D, DABA, DABAPM, DABPM, DABIPP, FIPP Date: 11/20/16; Time: 1:13 PM  Pain Score Disclaimer: We use the NRS-11 scale. This is a self-reported, subjective measurement of pain severity with only modest accuracy. It is used primarily to  identify changes within a particular patient. It must be understood that outpatient pain scales are significantly less accurate that those used for research, where they can be applied under ideal controlled circumstances with minimal exposure to variables. In reality, the score is likely to be a combination of pain intensity and pain affect, where pain affect describes the degree of emotional arousal or changes in action readiness caused by the sensory experience of pain. Factors such as social and work situation, setting, emotional state, anxiety levels, expectation, and prior pain experience may influence pain perception and show large inter-individual differences that may also be affected by time variables.  Patient instructions provided during this appointment: Patient Instructions  Pain Management Discharge Instructions  General Discharge Instructions :  If you need to reach your doctor call: Monday-Friday 8:00 am - 4:00 pm at [phone] or toll free [phone].  After clinic hours [phone] to have operator reach doctor.  Bring all of your medication bottles to all your appointments in the pain clinic.  To cancel or reschedule your appointment with Pain Management please remember to call 24 hours in advance to avoid a fee.  Refer to the  educational materials which you have been given on: General Risks, I had my Procedure. Discharge Instructions, Post Sedation.  Post Procedure Instructions:  The drugs you were given will stay in your system until tomorrow, so for the next 24 hours you should not drive, make any legal decisions or drink any alcoholic beverages.  You may eat anything you prefer, but it is better to start with liquids then soups and crackers, and gradually work up to solid foods.  Please notify your doctor immediately if you have any unusual bleeding, trouble breathing or pain that is not related to your normal pain.  Depending on the type of procedure that was done, some parts of your body may feel week and/or numb.  This usually clears up by tonight or the next day.  Walk with the use of an assistive device or accompanied by an adult for the 24 hours.  You may use ice on the affected area for the first 24 hours.  Put ice in a Ziploc bag and cover with a towel and place against area 15 minutes on 15 minutes off.  You may switch to heat after 24 hours.

## 2016-11-20 ENCOUNTER — Ambulatory Visit: Payer: Medicare Other | Attending: Pain Medicine | Admitting: Pain Medicine

## 2016-11-20 ENCOUNTER — Encounter: Payer: Self-pay | Admitting: Pain Medicine

## 2016-11-20 VITALS — BP 149/89 | HR 95 | Temp 97.7°F | Resp 16 | Ht 75.0 in | Wt 266.0 lb

## 2016-11-20 DIAGNOSIS — Z79891 Long term (current) use of opiate analgesic: Secondary | ICD-10-CM | POA: Insufficient documentation

## 2016-11-20 DIAGNOSIS — Z6833 Body mass index (BMI) 33.0-33.9, adult: Secondary | ICD-10-CM | POA: Insufficient documentation

## 2016-11-20 DIAGNOSIS — Z79899 Other long term (current) drug therapy: Secondary | ICD-10-CM | POA: Diagnosis not present

## 2016-11-20 DIAGNOSIS — Z5181 Encounter for therapeutic drug level monitoring: Secondary | ICD-10-CM | POA: Insufficient documentation

## 2016-11-20 DIAGNOSIS — I10 Essential (primary) hypertension: Secondary | ICD-10-CM | POA: Insufficient documentation

## 2016-11-20 DIAGNOSIS — M48061 Spinal stenosis, lumbar region without neurogenic claudication: Secondary | ICD-10-CM | POA: Diagnosis not present

## 2016-11-20 DIAGNOSIS — G894 Chronic pain syndrome: Secondary | ICD-10-CM | POA: Diagnosis not present

## 2016-11-20 DIAGNOSIS — R739 Hyperglycemia, unspecified: Secondary | ICD-10-CM | POA: Diagnosis not present

## 2016-11-20 DIAGNOSIS — Z87891 Personal history of nicotine dependence: Secondary | ICD-10-CM | POA: Diagnosis not present

## 2016-11-20 DIAGNOSIS — F329 Major depressive disorder, single episode, unspecified: Secondary | ICD-10-CM | POA: Insufficient documentation

## 2016-11-20 DIAGNOSIS — F411 Generalized anxiety disorder: Secondary | ICD-10-CM | POA: Diagnosis not present

## 2016-11-20 DIAGNOSIS — G8929 Other chronic pain: Secondary | ICD-10-CM | POA: Insufficient documentation

## 2016-11-20 DIAGNOSIS — G510 Bell's palsy: Secondary | ICD-10-CM | POA: Insufficient documentation

## 2016-11-20 DIAGNOSIS — M25512 Pain in left shoulder: Secondary | ICD-10-CM

## 2016-11-20 DIAGNOSIS — E559 Vitamin D deficiency, unspecified: Secondary | ICD-10-CM | POA: Insufficient documentation

## 2016-11-20 DIAGNOSIS — M25551 Pain in right hip: Secondary | ICD-10-CM

## 2016-11-20 DIAGNOSIS — M25511 Pain in right shoulder: Secondary | ICD-10-CM

## 2016-11-20 DIAGNOSIS — F1190 Opioid use, unspecified, uncomplicated: Secondary | ICD-10-CM

## 2016-11-20 MED ORDER — OXYCODONE HCL 5 MG PO TABS: 5.0000 mg | ORAL_TABLET | Freq: Four times a day (QID) | ORAL | 0 refills | Status: DC | PRN

## 2016-11-20 NOTE — Patient Instructions (Signed)

## 2016-11-20 NOTE — Progress Notes (Signed)
Nursing Pain Medication Assessment:  Safety precautions to be maintained throughout the outpatient stay will include: orient to surroundings, keep bed in low position, maintain call bell within reach at all times, provide assistance with transfer out of bed and ambulation.  Medication Inspection Compliance: Pill count conducted under aseptic conditions, in front of the patient. Neither the pills nor the bottle was removed from the patient's sight at any time. Once count was completed pills were immediately returned to the patient in their original bottle.  Medication: Oxycodone IR Pill Count: 0.5 of 120 pills remain Bottle Appearance: Standard pharmacy container. Clearly labeled. Filled Date: 82 / 09 / 2017 Medication last intake: 11/20/2016

## 2016-11-24 ENCOUNTER — Telehealth: Payer: Self-pay | Admitting: Family Medicine

## 2016-11-24 ENCOUNTER — Ambulatory Visit: Payer: Medicare Other | Admitting: Family Medicine

## 2016-11-24 DIAGNOSIS — Z0289 Encounter for other administrative examinations: Secondary | ICD-10-CM

## 2016-11-24 NOTE — Telephone Encounter (Signed)
no

## 2016-11-24 NOTE — Telephone Encounter (Signed)
Patient did not come in for their appointment today for sinus infection Please let me know if patient needs to be contacted immediately for follow up or no follow up needed.

## 2016-11-26 ENCOUNTER — Other Ambulatory Visit: Payer: Self-pay

## 2016-11-26 MED ORDER — LISINOPRIL 40 MG PO TABS: 40.0000 mg | ORAL_TABLET | Freq: Every day | ORAL | 0 refills | Status: DC

## 2016-11-26 NOTE — Telephone Encounter (Signed)
Pt left v/m requesting refill lisinopril. Last refilled # 15 on 11/02/16 with a note pt needs to schedule CPX. Pt last annual 11/19/14. No future appt scheduled. Pt has been seen for acute visits. Pt has taken last lisinopril.Please advise.

## 2016-11-29 ENCOUNTER — Other Ambulatory Visit: Payer: Self-pay | Admitting: Family Medicine

## 2016-12-15 ENCOUNTER — Encounter: Payer: Medicare Other | Admitting: Pain Medicine

## 2017-01-04 ENCOUNTER — Other Ambulatory Visit: Payer: Self-pay | Admitting: Family Medicine

## 2017-01-19 ENCOUNTER — Encounter: Payer: Medicare Other | Admitting: Family Medicine

## 2017-01-22 ENCOUNTER — Other Ambulatory Visit: Payer: Self-pay

## 2017-01-22 MED ORDER — LISINOPRIL 40 MG PO TABS: 40.0000 mg | ORAL_TABLET | Freq: Every day | ORAL | 0 refills | Status: DC

## 2017-01-22 NOTE — Telephone Encounter (Signed)
Pt scheduled CPx and refilled lisnopril # 15.00

## 2017-01-29 ENCOUNTER — Encounter: Payer: Medicare Other | Admitting: Family Medicine

## 2017-02-01 ENCOUNTER — Other Ambulatory Visit (INDEPENDENT_AMBULATORY_CARE_PROVIDER_SITE_OTHER): Payer: Medicare Other

## 2017-02-01 ENCOUNTER — Other Ambulatory Visit: Payer: Self-pay | Admitting: Family Medicine

## 2017-02-01 DIAGNOSIS — E559 Vitamin D deficiency, unspecified: Secondary | ICD-10-CM

## 2017-02-01 DIAGNOSIS — E538 Deficiency of other specified B group vitamins: Secondary | ICD-10-CM

## 2017-02-01 DIAGNOSIS — R790 Abnormal level of blood mineral: Secondary | ICD-10-CM

## 2017-02-01 DIAGNOSIS — I10 Essential (primary) hypertension: Secondary | ICD-10-CM

## 2017-02-01 DIAGNOSIS — R739 Hyperglycemia, unspecified: Secondary | ICD-10-CM | POA: Diagnosis not present

## 2017-02-01 LAB — COMPREHENSIVE METABOLIC PANEL
ALT: 10 U/L (ref 0–53)
AST: 11 U/L (ref 0–37)
Albumin: 4.2 g/dL (ref 3.5–5.2)
Alkaline Phosphatase: 49 U/L (ref 39–117)
BUN: 13 mg/dL (ref 6–23)
CALCIUM: 8.8 mg/dL (ref 8.4–10.5)
CHLORIDE: 101 meq/L (ref 96–112)
CO2: 33 mEq/L — ABNORMAL HIGH (ref 19–32)
CREATININE: 0.79 mg/dL (ref 0.40–1.50)
GFR: 114.66 mL/min (ref >60.00)
Glucose, Bld: 122 mg/dL — ABNORMAL HIGH (ref 70–99)
Potassium: 4.8 mEq/L (ref 3.5–5.1)
Sodium: 139 mEq/L (ref 135–145)
Total Bilirubin: 0.9 mg/dL (ref 0.2–1.2)
Total Protein: 6.6 g/dL (ref 6.0–8.3)

## 2017-02-01 LAB — LIPID PANEL
CHOLESTEROL: 139 mg/dL (ref 0–200)
HDL: 43.9 mg/dL (ref >39.00)
LDL CALC: 82 mg/dL (ref 0–99)
NonHDL: 94.77
TRIGLYCERIDES: 64 mg/dL (ref 0.0–149.0)
Total CHOL/HDL Ratio: 3
VLDL: 12.8 mg/dL (ref 0.0–40.0)

## 2017-02-01 LAB — MAGNESIUM: Magnesium: 1.7 mg/dL (ref 1.5–2.5)

## 2017-02-01 LAB — VITAMIN B12: VITAMIN B 12: 186 pg/mL — AB (ref 211–911)

## 2017-02-01 LAB — HEMOGLOBIN A1C: Hgb A1c MFr Bld: 6.5 % (ref 4.6–6.5)

## 2017-02-01 LAB — VITAMIN D 25 HYDROXY (VIT D DEFICIENCY, FRACTURES): VITD: 14.49 ng/mL — AB (ref 30.00–100.00)

## 2017-02-04 ENCOUNTER — Encounter: Payer: Self-pay | Admitting: Family Medicine

## 2017-02-04 ENCOUNTER — Ambulatory Visit (INDEPENDENT_AMBULATORY_CARE_PROVIDER_SITE_OTHER): Payer: Medicare Other | Admitting: Family Medicine

## 2017-02-04 VITALS — BP 144/100 | HR 88 | Temp 98.2°F | Ht 74.0 in | Wt 279.2 lb

## 2017-02-04 DIAGNOSIS — Z6835 Body mass index (BMI) 35.0-35.9, adult: Secondary | ICD-10-CM | POA: Diagnosis not present

## 2017-02-04 DIAGNOSIS — I10 Essential (primary) hypertension: Secondary | ICD-10-CM

## 2017-02-04 DIAGNOSIS — J019 Acute sinusitis, unspecified: Secondary | ICD-10-CM

## 2017-02-04 DIAGNOSIS — E119 Type 2 diabetes mellitus without complications: Secondary | ICD-10-CM | POA: Diagnosis not present

## 2017-02-04 DIAGNOSIS — E538 Deficiency of other specified B group vitamins: Secondary | ICD-10-CM | POA: Insufficient documentation

## 2017-02-04 DIAGNOSIS — E559 Vitamin D deficiency, unspecified: Secondary | ICD-10-CM

## 2017-02-04 NOTE — Assessment & Plan Note (Signed)
Congratulated on weight loss to date.  Discussed ongoing healthy diet and lifestyle changes to continue sustainable weight loss.

## 2017-02-04 NOTE — Assessment & Plan Note (Signed)
Has not been compliant with 2000 IU daily. Declines Rx strength 50,000 IU weekly. Will increase to 4000 IU daily.

## 2017-02-04 NOTE — Patient Instructions (Addendum)
Take vitamin D 4000 units daily. Take vitamin b12 1046mcg daily.  Continue lisinopril 40mg  daily.  Congratulations on healthy diet changes! Keep it up. Cholesterol levels are wonderful. Sugar was in diet - controlled diabetes range.  For sinuses - likely viral. Treat with nasal steroid - nasacort over the counter. If ongoing symptoms past 10 days, call us.   Diabetes Mellitus and Food It is important for you to manage your blood sugar (glucose) level. Your blood glucose level can be greatly affected by what you eat. Eating healthier foods in the appropriate amounts throughout the day at about the same time each day will help you control your blood glucose level. It can also help slow or prevent worsening of your diabetes mellitus. Healthy eating may even help you improve the level of your blood pressure and reach or maintain a healthy weight. General recommendations for healthful eating and cooking habits include:  Eating meals and snacks regularly. Avoid going long periods of time without eating to lose weight.  Eating a diet that consists mainly of plant-based foods, such as fruits, vegetables, nuts, legumes, and whole grains.  Using low-heat cooking methods, such as baking, instead of high-heat cooking methods, such as deep frying. Work with your dietitian to make sure you understand how to use the Nutrition Facts information on food labels. How can food affect me? Carbohydrates  Carbohydrates affect your blood glucose level more than any other type of food. Your dietitian will help you determine how many carbohydrates to eat at each meal and teach you how to count carbohydrates. Counting carbohydrates is important to keep your blood glucose at a healthy level, especially if you are using insulin or taking certain medicines for diabetes mellitus. Alcohol  Alcohol can cause sudden decreases in blood glucose (hypoglycemia), especially if you use insulin or take certain medicines for diabetes  mellitus. Hypoglycemia can be a life-threatening condition. Symptoms of hypoglycemia (sleepiness, dizziness, and disorientation) are similar to symptoms of having too much alcohol. If your health care provider has given you approval to drink alcohol, do so in moderation and use the following guidelines:  Women should not have more than one drink per day, and men should not have more than two drinks per day. One drink is equal to:  12 oz of beer.  5 oz of wine.  1 oz of hard liquor.  Do not drink on an empty stomach.  Keep yourself hydrated. Have water, diet soda, or unsweetened iced tea.  Regular soda, juice, and other mixers might contain a lot of carbohydrates and should be counted. What foods are not recommended? As you make food choices, it is important to remember that all foods are not the same. Some foods have fewer nutrients per serving than other foods, even though they might have the same number of calories or carbohydrates. It is difficult to get your body what it needs when you eat foods with fewer nutrients. Examples of foods that you should avoid that are high in calories and carbohydrates but low in nutrients include:  Trans fats (most processed foods list trans fats on the Nutrition Facts label).  Regular soda.  Juice.  Candy.  Sweets, such as cake, pie, doughnuts, and cookies.  Fried foods. What foods can I eat? Eat nutrient-rich foods, which will nourish your body and keep you healthy. The food you should eat also will depend on several factors, including:  The calories you need.  The medicines you take.  Your weight.  Your blood  glucose level.  Your blood pressure level.  Your cholesterol level. You should eat a variety of foods, including:  Protein.  Lean cuts of meat.  Proteins low in saturated fats, such as fish, egg whites, and beans. Avoid processed meats.  Fruits and vegetables.  Fruits and vegetables that may help control blood glucose  levels, such as apples, mangoes, and yams.  Dairy products.  Choose fat-free or low-fat dairy products, such as milk, yogurt, and cheese.  Grains, bread, pasta, and rice.  Choose whole grain products, such as multigrain bread, whole oats, and brown rice. These foods may help control blood pressure.  Fats.  Foods containing healthful fats, such as nuts, avocado, olive oil, canola oil, and fish. Does everyone with diabetes mellitus have the same meal plan? Because every person with diabetes mellitus is different, there is not one meal plan that works for everyone. It is very important that you meet with a dietitian who will help you create a meal plan that is just right for you. This information is not intended to replace advice given to you by your health care provider. Make sure you discuss any questions you have with your health care provider. Document Released: 08/27/2005 Document Revised: 05/07/2016 Document Reviewed: 10/27/2013 Elsevier Interactive Patient Education  2017 [employer].

## 2017-02-04 NOTE — Assessment & Plan Note (Signed)
Chronic, mildly elevated. Continue current regimen. Anticipate better control with ongoing weight loss.

## 2017-02-04 NOTE — Assessment & Plan Note (Signed)
Congratulated on weight loss to date. Last year cbg 311. Reviewed today's A1c 6.5% - diet controlled. Pt motivated to continue weight loss efforts.

## 2017-02-04 NOTE — Assessment & Plan Note (Signed)
Discussed viral given short duration. Rec INS. Update if no improvement after 10 days for abx course. Pt agrees with plan.

## 2017-02-04 NOTE — Assessment & Plan Note (Signed)
Start 1000 mcg oral B12 daily. Recheck next visit.

## 2017-02-04 NOTE — Progress Notes (Addendum)
BP (!) 144/100 (BP Location: Right Arm, Cuff Size: Large)   Pulse 88   Temp 98.2 F (36.8 C) (Oral)   Ht 6\' 2"  (1.88 m)   Wt 279 lb 4 oz (126.7 kg)   BMI 35.85 kg/m    CC: med refill visit Subjective:    Patient ID: Larry Goodwin, male    DOB: 06/06/1976, 41 y.o.   MRN: [id number]  HPI: Larry Goodwin is a 41 y.o. male presenting on 02/04/2017 for Medication Refill and Sinusitis (head/sinus pressure x 3days)   Last seen by me 07/2015. Missed several appointments in interim.   Obesity - ongoing weight gain noted. He has lost significantly since last seen by me (342lbs). He cut out sugars, simple carbs and sweets, cutting back on carbs. Good proteins and salads. Drinking water and diet pepsi. Activity limited by chronic back pain. Nadir 250lbs 09/2016.   HTN - Compliant with current antihypertensive regimen of lisinopril 40mg  daily.Does not check blood pressures at home. No low blood pressure readings or symptoms of dizziness/syncope.  Denies HA, vision changes, CP/tightness, SOB, leg swelling.   3d h/o sinus pressure and congestion. Hasn't treated anything with this. H/o recurrent sinus infections.   Relevant past medical, surgical, family and social history reviewed and updated as indicated. Interim medical history since our last visit reviewed. Allergies and medications reviewed and updated. Outpatient Medications Prior to Visit  Medication Sig Dispense Refill  . Cholecalciferol (VITAMIN D3) 2000 units capsule Take 1 capsule (2,000 Units total) by mouth daily. (Patient taking differently: Take 4,000 Units by mouth daily. ) 90 capsule 0  . lisinopril (PRINIVIL,ZESTRIL) 40 MG tablet Take 1 tablet (40 mg total) by mouth daily. **MUST HAVE PHYSICAL FOR FURTHER REFILLS** 15 tablet 0  . Magnesium Oxide 500 MG CAPS Take 1 capsule (500 mg total) by mouth daily. 90 capsule 0  . oxyCODONE (OXY IR/ROXICODONE) 5 MG immediate release tablet Take 1 tablet (5 mg total) by mouth every 6  (six) hours as needed for severe pain. 120 tablet 0  . oxyCODONE (OXY IR/ROXICODONE) 5 MG immediate release tablet Take 1 tablet (5 mg total) by mouth every 6 (six) hours as needed for severe pain. 120 tablet 0  . oxyCODONE (OXY IR/ROXICODONE) 5 MG immediate release tablet Take 1 tablet (5 mg total) by mouth every 6 (six) hours as needed for severe pain. 120 tablet 0   No facility-administered medications prior to visit.      Per HPI unless specifically indicated in ROS section below Review of Systems     Objective:    BP (!) 144/100 (BP Location: Right Arm, Cuff Size: Large)   Pulse 88   Temp 98.2 F (36.8 C) (Oral)   Ht 6\' 2"  (1.88 m)   Wt 279 lb 4 oz (126.7 kg)   BMI 35.85 kg/m   Wt Readings from Last 3 Encounters:  02/04/17 279 lb 4 oz (126.7 kg)  11/20/16 266 lb (120.7 kg)  09/22/16 250 lb (113.4 kg)    Physical Exam  Constitutional: He appears well-developed and well-nourished. No distress.  HENT:  Head: Normocephalic and atraumatic.  Right Ear: Hearing normal.  Left Ear: Hearing normal.  Nose: Mucosal edema (nasal mucosal inflammation) present. No rhinorrhea. Right sinus exhibits maxillary sinus tenderness. Right sinus exhibits no frontal sinus tenderness. Left sinus exhibits no maxillary sinus tenderness and no frontal sinus tenderness.  Mouth/Throat: Uvula is midline, oropharynx is clear and moist and mucous membranes are normal. No oropharyngeal exudate,  posterior oropharyngeal edema, posterior oropharyngeal erythema or tonsillar abscesses.  Eyes: Conjunctivae and EOM are normal. Pupils are equal, round, and reactive to light. No scleral icterus.  Neck: Normal range of motion. Neck supple.  Cardiovascular: Normal rate, regular rhythm, normal heart sounds and intact distal pulses.   No murmur heard. Pulmonary/Chest: Effort normal and breath sounds normal. No respiratory distress. He has no wheezes. He has no rales.  Lymphadenopathy:    He has no cervical adenopathy.    Skin: Skin is warm and dry. No rash noted.  Nursing note and vitals reviewed.  Results for orders placed or performed in visit on 02/01/17  Lipid panel  Result Value Ref Range   Cholesterol 139 0 - 200 mg/dL   Triglycerides 64.0 0.0 - 149.0 mg/dL   HDL 43.90 >39.00 mg/dL   VLDL 12.8 0.0 - 40.0 mg/dL   LDL Cholesterol 82 0 - 99 mg/dL   Total CHOL/HDL Ratio 3    NonHDL 94.77   Comprehensive metabolic panel  Result Value Ref Range   Sodium 139 135 - 145 mEq/L   Potassium 4.8 3.5 - 5.1 mEq/L   Chloride 101 96 - 112 mEq/L   CO2 33 (H) 19 - 32 mEq/L   Glucose, Bld 122 (H) 70 - 99 mg/dL   BUN 13 6 - 23 mg/dL   Creatinine, Ser 0.79 0.40 - 1.50 mg/dL   Total Bilirubin 0.9 0.2 - 1.2 mg/dL   Alkaline Phosphatase 49 39 - 117 U/L   AST 11 0 - 37 U/L   ALT 10 0 - 53 U/L   Total Protein 6.6 6.0 - 8.3 g/dL   Albumin 4.2 3.5 - 5.2 g/dL   Calcium 8.8 8.4 - 10.5 mg/dL   GFR 114.66 >60.00 mL/min  Hemoglobin A1c  Result Value Ref Range   Hgb A1c MFr Bld 6.5 4.6 - 6.5 %  VITAMIN D 25 Hydroxy (Vit-D Deficiency, Fractures)  Result Value Ref Range   VITD 14.49 (L) 30.00 - 100.00 ng/mL  Vitamin B12  Result Value Ref Range   Vitamin B-12 186 (L) 211 - 911 pg/mL  Magnesium  Result Value Ref Range   Magnesium 1.7 1.5 - 2.5 mg/dL      Assessment & Plan:   Problem List Items Addressed This Visit    Acute sinusitis    Discussed viral given short duration. Rec INS. Update if no improvement after 10 days for abx course. Pt agrees with plan.       HTN (hypertension)    Chronic, mildly elevated. Continue current regimen. Anticipate better control with ongoing weight loss.       Severe obesity (BMI 35.0-35.9 with comorbidity) ([place])    Congratulated on weight loss to date.  Discussed ongoing healthy diet and lifestyle changes to continue sustainable weight loss.      Type 2 diabetes, diet controlled ([place]) - Primary    Congratulated on weight loss to date. Last year cbg 311. Reviewed today's  A1c 6.5% - diet controlled. Pt motivated to continue weight loss efforts.       Vitamin B12 deficiency    Start 1000 mcg oral B12 daily. Recheck next visit.       Vitamin D deficiency    Has not been compliant with 2000 IU daily. Declines Rx strength 50,000 IU weekly. Will increase to 4000 IU daily.           Follow up plan: Return in about 6 months (around 08/04/2017) for follow up visit.  Ria Bush, MD

## 2017-02-04 NOTE — Progress Notes (Signed)
Pre visit review using our clinic review tool, if applicable. No additional management support is needed unless otherwise documented below in the visit note. 

## 2017-02-09 ENCOUNTER — Other Ambulatory Visit: Payer: Self-pay | Admitting: Family Medicine

## 2017-02-09 NOTE — Telephone Encounter (Signed)
Pt request refill of lisinopril; left v/m requesting cb need to know which pharmacy.

## 2017-02-17 ENCOUNTER — Telehealth: Payer: Self-pay

## 2017-02-17 DIAGNOSIS — J0191 Acute recurrent sinusitis, unspecified: Secondary | ICD-10-CM

## 2017-02-17 MED ORDER — AMOXICILLIN-POT CLAVULANATE 875-125 MG PO TABS: 1.0000 | ORAL_TABLET | Freq: Two times a day (BID) | ORAL | 0 refills | Status: DC

## 2017-02-17 NOTE — Telephone Encounter (Signed)
Patient states he was here on 02/04/17 saw Dr.G and was told that if his sinus infection didn't get better he would call him in something for it?  He uses [facility] in [place].

## 2017-02-17 NOTE — Telephone Encounter (Signed)
Noted and reviewed office visit from 02/22. Start Augmentin antibiotics. Take 1 tablet by mouth twice daily for 10 days. This was sent to the [facility] in [place].

## 2017-02-17 NOTE — Telephone Encounter (Signed)
Message left for patient to return my call.  

## 2017-02-18 ENCOUNTER — Ambulatory Visit: Payer: Medicare Other | Attending: Pain Medicine | Admitting: Pain Medicine

## 2017-02-18 ENCOUNTER — Encounter: Payer: Self-pay | Admitting: Pain Medicine

## 2017-02-18 VITALS — BP 146/91 | HR 80 | Temp 98.1°F | Resp 16 | Ht 74.0 in | Wt 279.0 lb

## 2017-02-18 DIAGNOSIS — Z79891 Long term (current) use of opiate analgesic: Secondary | ICD-10-CM | POA: Diagnosis not present

## 2017-02-18 DIAGNOSIS — G8929 Other chronic pain: Secondary | ICD-10-CM

## 2017-02-18 DIAGNOSIS — M25551 Pain in right hip: Secondary | ICD-10-CM | POA: Insufficient documentation

## 2017-02-18 DIAGNOSIS — F1190 Opioid use, unspecified, uncomplicated: Secondary | ICD-10-CM

## 2017-02-18 DIAGNOSIS — R790 Abnormal level of blood mineral: Secondary | ICD-10-CM

## 2017-02-18 DIAGNOSIS — M545 Low back pain, unspecified: Secondary | ICD-10-CM

## 2017-02-18 DIAGNOSIS — G894 Chronic pain syndrome: Secondary | ICD-10-CM | POA: Diagnosis not present

## 2017-02-18 DIAGNOSIS — Z5181 Encounter for therapeutic drug level monitoring: Secondary | ICD-10-CM | POA: Diagnosis not present

## 2017-02-18 DIAGNOSIS — Z87891 Personal history of nicotine dependence: Secondary | ICD-10-CM | POA: Insufficient documentation

## 2017-02-18 DIAGNOSIS — M25512 Pain in left shoulder: Secondary | ICD-10-CM

## 2017-02-18 DIAGNOSIS — M25511 Pain in right shoulder: Secondary | ICD-10-CM | POA: Diagnosis not present

## 2017-02-18 DIAGNOSIS — Z79899 Other long term (current) drug therapy: Secondary | ICD-10-CM | POA: Diagnosis not present

## 2017-02-18 MED ORDER — MAGNESIUM OXIDE -MG SUPPLEMENT 500 MG PO CAPS: 1.0000 | ORAL_CAPSULE | Freq: Every day | ORAL | 0 refills | Status: DC

## 2017-02-18 MED ORDER — OXYCODONE HCL 5 MG PO TABS: 5.0000 mg | ORAL_TABLET | Freq: Four times a day (QID) | ORAL | 0 refills | Status: DC | PRN

## 2017-02-18 NOTE — Progress Notes (Signed)
Nursing Pain Medication Assessment:  Safety precautions to be maintained throughout the outpatient stay will include: orient to surroundings, keep bed in low position, maintain call bell within reach at all times, provide assistance with transfer out of bed and ambulation.  Medication Inspection Compliance: Pill count conducted under aseptic conditions, in front of the patient. Neither the pills nor the bottle was removed from the patient's sight at any time. Once count was completed pills were immediately returned to the patient in their original bottle.  Medication: See above Pill/Patch Count: 3 of 120 pills remain Bottle Appearance: Standard pharmacy container. Clearly labeled. Filled Date: 02 / 07 / 2018 Last Medication intake:  Today

## 2017-02-18 NOTE — Progress Notes (Signed)
Patient's Name: Larry Goodwin  MRN: [id number]  Referring Provider: Ria Bush, MD  DOB: [birth year]  PCP: Ria Bush, MD  DOS: 02/18/2017  Note by: Kathlen Brunswick. Dossie Arbour, MD  Service setting: Ambulatory outpatient  Specialty: Interventional Pain Management  Location: [facility] (AMB) Pain Management Facility    Patient type: Established   Primary Reason(s) for Visit: Encounter for prescription drug management (Level of risk: moderate) CC: Back Pain (lower bilateral)  HPI  [name] is a 41 y.o. year old, male patient, who comes today for a medication management evaluation. He has Acute sinusitis; Lumbar DDD (degenerative disc disease); HTN (hypertension); Bell's palsy; Severe obesity (BMI 35.0-35.9 with comorbidity) ([place]); Chronic pain syndrome; Medicare annual wellness visit, initial; Type 2 diabetes, diet controlled ([place]); History of tobacco abuse; Low magnesium levels; Vitamin D deficiency; Myofascial pain syndrome; Lumbar facet syndrome (Bilateral) (R>L); Chronic upper back pain (Location of Tertiary source of pain); Generalized anxiety disorder; Depression; Lumbar spondylosis with radiculopathy; Epidural fibrosis; Failed back surgical syndrome; Encounter for therapeutic drug level monitoring; Long term prescription opiate use; Long term current use of opiate analgesic; Opiate use (30 MME/Day); Chronic low back pain (Location of Primary Source of Pain) (Bilateral) (R>L); Chronic sacroiliac joint pain (Right); Abnormal MRI, lumbar spine;  Abnormal CT myelogram of the thoracic spine; Thoracic disc herniation; Bulge of lumbar disc without myelopathy; Vertebral body hemangioma; Disturbance of skin sensation; Cervical IVDD (intervertebral disc displacement); Cervical foraminal stenosis (C5-6 and C7-T1: Right); Cervical central spinal stenosis (C5-6); Thoracic IVDD (intervertebral disc displacement); Thoracic facet arthropathy; Thoracic facet syndrome; Lumbar IVDD (intervertebral disc displacement);  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 His primarily concern today is the Back Pain (lower bilateral)  Pain Assessment: Self-Reported Pain Score: 6 /10 Clinically the patient looks like a 1/10 Reported level is inconsistent with clinical observations. Information on the proper use of the pain scale provided to the patient today Pain Type: Chronic pain Pain Location: Back Pain Orientation: Lower, Left, Right Pain Descriptors / Indicators: Constant, Dull, Throbbing Pain Frequency: Constant  [name] was last scheduled for an appointment on 11/20/2016 for medication management. During today's appointment we reviewed [name]'s chronic pain status, as well as his outpatient medication regimen.  The patient  reports that he does not use drugs. His body mass index is 35.82 kg/m.  Further details on both, my assessment(s), as well as the proposed treatment plan, please see below.  Controlled Substance Pharmacotherapy Assessment REMS (Risk Evaluation and Mitigation Strategy)  Analgesic:Oxycodone IR 5 mg every 6 hours (4 per day) (20 mg/day) MME/day:30 mg/day.  Patterson,Larry G, RN  02/18/2017  9:15 AM  Sign at close encounter Nursing Pain Medication Assessment:  Safety precautions to be maintained throughout the outpatient stay will include: orient to surroundings, keep bed in low position, maintain call bell within reach at all times, provide assistance with transfer out of bed and ambulation.  Medication Inspection Compliance: Pill count conducted under aseptic conditions, in front of the patient. Neither the pills nor the bottle was removed from the patient's sight at any time. Once count was  completed pills were immediately returned to the patient in their original bottle.  Medication: See above Pill/Patch Count: 3 of 120 pills remain Bottle Appearance: Standard pharmacy container. Clearly  labeled. Filled Date: 02 / 07 / 2018 Last Medication intake:  Today   Pharmacokinetics: Liberation and absorption (onset of action): WNL Distribution (time to peak effect): WNL Metabolism and excretion (duration of action): WNL         Pharmacodynamics: Desired effects: Analgesia: [name] reports >50% benefit. Functional ability: Patient reports that medication allows him to accomplish basic ADLs Clinically meaningful improvement in function (CMIF): Sustained CMIF goals met Perceived effectiveness: Described as relatively effective, allowing for increase in activities of daily living (ADL) Undesirable effects: Side-effects or Adverse reactions: None reported Monitoring: [place] PMP: Online review of the past 71-monthperiod conducted. Compliant with practice rules and regulations List of all UDS test(s) done:  Lab Results  Component Value Date   TOXASSSELUR FINAL 06/09/2016   TOXASSSELUR FINAL 03/25/2016   TOXASSSELUR FINAL 12/30/2015   Last UDS on record: ToxAssure Select 13  Date Value Ref Range Status  06/09/2016 FINAL  Final    Comment:    ==================================================================== TOXASSURE SELECT 13 (MW) ==================================================================== Test                             Result       Flag       Units Drug Present and Declared for Prescription Verification   Oxycodone                      216          EXPECTED   ng/mg creat   Oxymorphone                    493          EXPECTED   ng/mg creat   Noroxycodone                   781          EXPECTED   ng/mg creat   Noroxymorphone                 198          EXPECTED   ng/mg creat    Sources of oxycodone are scheduled prescription medications.    Oxymorphone,  noroxycodone, and noroxymorphone are expected    metabolites of oxycodone. Oxymorphone is also available as a    scheduled prescription medication. ==================================================================== Test                      Result    Flag   Units      Ref Range   Creatinine              81               mg/dL      >=20 ==================================================================== Declared Medications:  The flagging and interpretation on this report are based on the  following declared medications.  Unexpected results may arise from  inaccuracies in the declared medications.  **Note: The testing scope of this panel includes these medications:  Oxycodone  **Note: The testing scope of this panel does not include following  reported medications:  Cholecalciferol  Lisinopril  Magnesium (Mag) ==================================================================== For clinical consultation, please call [phone] ====================================================================    UDS interpretation: Compliant          Medication Assessment Form: Reviewed. Patient indicates being compliant with therapy Treatment compliance: Compliant Risk Assessment Profile: Aberrant behavior: See prior evaluations. None observed  or detected today Comorbid factors increasing risk of overdose: See prior notes. No additional risks detected today Risk of substance use disorder (SUD): Low Opioid Risk Tool (ORT) Total Score: 0  Interpretation Table:  Score <3 = Low Risk for SUD  Score between 4-7 = Moderate Risk for SUD  Score >8 = High Risk for Opioid Abuse   Risk Mitigation Strategies:  Patient Counseling: Covered Patient-Prescriber Agreement (PPA): Present and active  Notification to other healthcare providers: Done  Pharmacologic Plan: No change in therapy, at this time  Laboratory Chemistry  Inflammation Markers Lab Results  Component Value Date   CRP 0.8  06/09/2016   ESRSEDRATE 6 06/09/2016   (CRP: Acute Phase) (ESR: Chronic Phase) Renal Function Markers Lab Results  Component Value Date   BUN 13 02/01/2017   CREATININE 0.79 02/01/2017   GFRAA >60 06/09/2016   GFRNONAA >60 06/09/2016   Hepatic Function Markers Lab Results  Component Value Date   AST 11 02/01/2017   ALT 10 02/01/2017   ALBUMIN 4.2 02/01/2017   ALKPHOS 49 02/01/2017   Electrolytes Lab Results  Component Value Date   NA 139 02/01/2017   K 4.8 02/01/2017   CL 101 02/01/2017   CALCIUM 8.8 02/01/2017   MG 1.7 02/01/2017   Neuropathy Markers Lab Results  Component Value Date   VITAMINB12 186 (L) 02/01/2017   Bone Pathology Markers Lab Results  Component Value Date   ALKPHOS 49 02/01/2017   VD25OH 14.49 (L) 02/01/2017   25OHVITD1 31 06/09/2016   25OHVITD2 6.6 06/09/2016   25OHVITD3 24 06/09/2016   CALCIUM 8.8 02/01/2017   Coagulation Parameters Lab Results  Component Value Date   PLT 240 08/03/2014   Cardiovascular Markers Lab Results  Component Value Date   HGB 14.9 08/03/2014   HCT 43.9 08/03/2014   Note: Lab results reviewed.  Recent Diagnostic Imaging Review  No results found. Note: Imaging results reviewed.          Meds  The patient has a current medication list which includes the following prescription(s): amoxicillin-clavulanate, vitamin d3, lisinopril, magnesium oxide, oxycodone, oxycodone, oxycodone, and vitamin b-12.  Current Outpatient Prescriptions on File Prior to Visit  Medication Sig  . amoxicillin-clavulanate (AUGMENTIN) 875-125 MG tablet Take 1 tablet by mouth 2 (two) times daily.  . Cholecalciferol (VITAMIN D3) 2000 units capsule Take 1 capsule (2,000 Units total) by mouth daily. (Patient taking differently: Take 4,000 Units by mouth daily. )  . lisinopril (PRINIVIL,ZESTRIL) 40 MG tablet Take 1 tablet (40 mg total) by mouth daily.  . vitamin B-12 (CYANOCOBALAMIN) 1000 MCG tablet Take 1,000 mcg by mouth daily.   No  current facility-administered medications on file prior to visit.    ROS  Constitutional: Denies any fever or chills Gastrointestinal: No reported hemesis, hematochezia, vomiting, or acute GI distress Musculoskeletal: Denies any acute onset joint swelling, redness, loss of ROM, or weakness Neurological: No reported episodes of acute onset apraxia, aphasia, dysarthria, agnosia, amnesia, paralysis, loss of coordination, or loss of consciousness  Allergies  Mr. Yanke is allergic to levaquin [levofloxacin in d5w].  [place]  Drug: Mr. Grumbine  reports that he does not use drugs. Alcohol:  reports that he does not drink alcohol. Tobacco:  reports that he quit smoking about 9 years ago. He has never used smokeless tobacco. Medical:  has a past medical history of Bell's palsy (2015); Chronic pain syndrome; DDD (degenerative disc disease), lumbar; HTN (hypertension); and Morbid obesity ([place]) (08/16/2014). Family: family history includes COPD in his  mother; Hypertension in his other.  Past Surgical History:  Procedure Laterality Date  . BACK SURGERY  2012   x 2 [place])  . CHOLECYSTECTOMY     Constitutional Exam  General appearance: Well nourished, well developed, and well hydrated. In no apparent acute distress Vitals:   02/18/17 0906 02/18/17 0909  BP: (!) 160/99 (!) 146/91  Pulse: 80   Resp: 16   Temp: 98.1 F (36.7 C)   TempSrc: Oral   SpO2: 100%   Weight:  279 lb (126.6 kg)  Height: '6\' 2"'  (1.88 m) '6\' 2"'  (1.88 m)   BMI Assessment: Estimated body mass index is 35.82 kg/m as calculated from the following:   Height as of this encounter: '6\' 2"'  (1.88 m).   Weight as of this encounter: 279 lb (126.6 kg).  BMI interpretation table: BMI level Category Range association with higher incidence of chronic pain  <18 kg/m2 Underweight   18.5-24.9 kg/m2 Ideal body weight   25-29.9 kg/m2 Overweight Increased incidence by 20%  30-34.9 kg/m2 Obese (Class I) Increased incidence by 68%  35-39.9  kg/m2 Severe obesity (Class II) Increased incidence by 136%  >40 kg/m2 Extreme obesity (Class III) Increased incidence by 254%   BMI Readings from Last 4 Encounters:  02/18/17 35.82 kg/m  02/04/17 35.85 kg/m  11/20/16 33.25 kg/m  09/22/16 32.10 kg/m   Wt Readings from Last 4 Encounters:  02/18/17 279 lb (126.6 kg)  02/04/17 279 lb 4 oz (126.7 kg)  11/20/16 266 lb (120.7 kg)  09/22/16 250 lb (113.4 kg)  Psych/Mental status: Alert, oriented x 3 (person, place, & time)       Eyes: PERLA Respiratory: No evidence of acute respiratory distress  Cervical Spine Exam  Inspection: No masses, redness, or swelling Alignment: Symmetrical Functional ROM: Unrestricted ROM Stability: No instability detected Muscle strength & Tone: Functionally intact Sensory: Unimpaired Palpation: Non-contributory  Upper Extremity (UE) Exam    Side: Right upper extremity  Side: Left upper extremity  Inspection: No masses, redness, swelling, or asymmetry. No contractures  Inspection: No masses, redness, swelling, or asymmetry. No contractures  Functional ROM: Unrestricted ROM          Functional ROM: Unrestricted ROM          Muscle strength & Tone: Functionally intact  Muscle strength & Tone: Functionally intact  Sensory: Unimpaired  Sensory: Unimpaired  Palpation: Euthermic  Palpation: Euthermic  Specialized Test(s): Deferred         Specialized Test(s): Deferred          Thoracic Spine Exam  Inspection: No masses, redness, or swelling Alignment: Symmetrical Functional ROM: Unrestricted ROM Stability: No instability detected Sensory: Unimpaired Muscle strength & Tone: Functionally intact Palpation: Non-contributory  Lumbar Spine Exam  Inspection: No masses, redness, or swelling Alignment: Symmetrical Functional ROM: Unrestricted ROM Stability: No instability detected Muscle strength & Tone: Functionally intact Sensory: Unimpaired Palpation: Non-contributory Provocative Tests: Lumbar  Hyperextension and rotation test: evaluation deferred today       Patrick's Maneuver: evaluation deferred today              Gait & Posture Assessment  Ambulation: Unassisted Gait: Relatively normal for age and body habitus Posture: WNL   Lower Extremity Exam    Side: Right lower extremity  Side: Left lower extremity  Inspection: No masses, redness, swelling, or asymmetry. No contractures  Inspection: No masses, redness, swelling, or asymmetry. No contractures  Functional ROM: Unrestricted ROM          Functional  ROM: Unrestricted ROM          Muscle strength & Tone: Functionally intact  Muscle strength & Tone: Functionally intact  Sensory: Unimpaired  Sensory: Unimpaired  Palpation: No palpable anomalies  Palpation: No palpable anomalies   Assessment  Primary Diagnosis & Pertinent Problem List: The primary encounter diagnosis was Chronic low back pain (Location of Primary Source of Pain) (Bilateral) (R>L). Diagnoses of Chronic hip pain (Location of Secondary source of pain) (Right), Chronic shoulder pain (Location of Tertiary source of pain) (Bilateral) (Right), Chronic pain syndrome, Long term prescription opiate use, Opiate use (30 MME/Day), and Low magnesium levels were also pertinent to this visit.  Status Diagnosis  Controlled Controlled Controlled 1. Chronic low back pain (Location of Primary Source of Pain) (Bilateral) (R>L)   2. Chronic hip pain (Location of Secondary source of pain) (Right)   3. Chronic shoulder pain (Location of Tertiary source of pain) (Bilateral) (Right)   4. Chronic pain syndrome   5. Long term prescription opiate use   6. Opiate use (30 MME/Day)   7. Low magnesium levels      Plan of Care  Pharmacotherapy (Medications Ordered): Meds ordered this encounter  Medications  . Magnesium Oxide 500 MG CAPS    Sig: Take 1 capsule (500 mg total) by mouth daily.    Dispense:  90 capsule    Refill:  0    Do not place this medication, or any other  prescription from our practice, on "Automatic Refill". Patient may have prescription filled one day early if pharmacy is closed on scheduled refill date.  Marland Kitchen oxyCODONE (OXY IR/ROXICODONE) 5 MG immediate release tablet    Sig: Take 1 tablet (5 mg total) by mouth every 6 (six) hours as needed for severe pain.    Dispense:  120 tablet    Refill:  0    Do not place this medication, or any other prescription from our practice, on "Automatic Refill". Patient may have prescription filled one day early if pharmacy is closed on scheduled refill date. Do not fill until: 03/21/17 To last until: 04/20/17  . oxyCODONE (OXY IR/ROXICODONE) 5 MG immediate release tablet    Sig: Take 1 tablet (5 mg total) by mouth every 6 (six) hours as needed for severe pain.    Dispense:  120 tablet    Refill:  0    Do not place this medication, or any other prescription from our practice, on "Automatic Refill". Patient may have prescription filled one day early if pharmacy is closed on scheduled refill date. Do not fill until: 02/19/17 To last until: 03/21/17  . oxyCODONE (OXY IR/ROXICODONE) 5 MG immediate release tablet    Sig: Take 1 tablet (5 mg total) by mouth every 6 (six) hours as needed for severe pain.    Dispense:  120 tablet    Refill:  0    Do not place this medication, or any other prescription from our practice, on "Automatic Refill". Patient may have prescription filled one day early if pharmacy is closed on scheduled refill date. Do not fill until: 04/20/17 To last until: 05/20/17   New Prescriptions   No medications on file   Medications administered today: Mr. Cudd had no medications administered during this visit. Lab-work, procedure(s), and/or referral(s): Orders Placed This Encounter  Procedures  . ToxASSURE Select 13 (MW), Urine   Imaging and/or referral(s): None  Interventional therapies: Planned, scheduled, and/or pending:   Not at this time.   Considering:   Diagnostic  right-sided  cervical epidural steroid injection  Diagnostic thoracic epidural steroid injection  Diagnostic bilateral thoracic facet block  Possible bilateral thoracic facet radiofrequency ablation  Diagnostic bilateral lumbar facet block  Possible bilateral lumbar facet radiofrequency ablation  Diagnostic right sacroiliac joint block  Possible right sacroiliac joint radiofrequency ablation  Diagnostic caudal epidural steroid injection and epidurogram  Diagnostic right-sided L4-5 transforaminal epidural steroid injection  Diagnostic right intra-articular knee injection  Diagnostic right genicular nerve block  Possible right genicular radiofrequency ablation    Palliative PRN treatment(s):   Diagnostic right-sided cervical epidural steroid injection  Diagnostic thoracic epidural steroid injection  Diagnostic bilateral thoracic facet block  Diagnostic bilateral lumbar facet block  Diagnostic right sacroiliac joint block  Diagnostic caudal epidural steroid injection and epidurogram   Diagnostic right-sided L4-5 transforaminal epidural steroid injection  Diagnostic right intra-articular knee injection  Diagnostic right genicular nerve block    Provider-requested follow-up: Return in about 3 months (around 05/13/2017) for (Nurse Practitioner) Med-Mgmt, in addition, (PRN) procedure.  Future Appointments Date Time Provider [place]  08/05/2017 10:00 AM Ria Bush, MD [id number]   Primary Care Physician: Ria Bush, MD Location: [facility] Note by: Beatriz Chancellor A. Dossie Arbour, M.D, DABA, DABAPM, DABPM, DABIPP, FIPP Date: 02/18/2017; Time: 8:08 AM  Pain Score Disclaimer: We use the NRS-11 scale. This is a self-reported, subjective measurement of pain severity with only modest accuracy. It is used primarily to identify changes within a particular patient. It must be understood that outpatient pain scales are significantly less accurate that those used  for research, where they can be applied under ideal controlled circumstances with minimal exposure to variables. In reality, the score is likely to be a combination of pain intensity and pain affect, where pain affect describes the degree of emotional arousal or changes in action readiness caused by the sensory experience of pain. Factors such as social and work situation, setting, emotional state, anxiety levels, expectation, and prior pain experience may influence pain perception and show large inter-individual differences that may also be affected by time variables.  Patient instructions provided during this appointment: Patient Instructions   Rx hydrocodone 5 mg IR x 3 months to begin filling on 02/19/17 given to the patient.  Pain Score  Introduction: The pain score used by this practice is the Verbal Numerical Rating Scale (VNRS-11). This is an 11-point scale. It is for adults and children 10 years or older. There are significant differences in how the pain score is reported, used, and applied. Forget everything you learned in the past and learn this scoring system.  General Information: The scale should reflect your current level of pain. Unless you are specifically asked for the level of your worst pain, or your average pain. If you are asked for one of these two, then it should be understood that it is over the past 24 hours.  Basic Activities of Daily Living (ADL): Personal hygiene, dressing, eating, transferring, and using restroom.  Instructions: Most patients tend to report their level of pain as a combination of two factors, their physical pain and their psychosocial pain. This last one is also known as "suffering" and it is reflection of how physical pain affects you socially and psychologically. From now on, report them separately. From this point on, when asked to report your pain level, report only your physical pain. Use the following table for reference.  Pain Clinic Pain Levels  (0-5/10)  Pain Level Score Description  No Pain 0   Mild  pain 1 Nagging, annoying, but does not interfere with basic activities of daily living (ADL). Patients are able to eat, bathe, get dressed, toileting (being able to get on and off the toilet and perform personal hygiene functions), transfer (move in and out of bed or a chair without assistance), and maintain continence (able to control bladder and bowel functions). Blood pressure and heart rate are unaffected. A normal heart rate for a healthy adult ranges from 60 to 100 bpm (beats per minute).   Mild to moderate pain 2 Noticeable and distracting. Impossible to hide from other people. More frequent flare-ups. Still possible to adapt and function close to normal. It can be very annoying and may have occasional stronger flare-ups. With discipline, patients may get used to it and adapt.   Moderate pain 3 Interferes significantly with activities of daily living (ADL). It becomes difficult to feed, bathe, get dressed, get on and off the toilet or to perform personal hygiene functions. Difficult to get in and out of bed or a chair without assistance. Very distracting. With effort, it can be ignored when deeply involved in activities.   Moderately severe pain 4 Impossible to ignore for more than a few minutes. With effort, patients may still be able to manage work or participate in some social activities. Very difficult to concentrate. Signs of autonomic nervous system discharge are evident: dilated pupils (mydriasis); mild sweating (diaphoresis); sleep interference. Heart rate becomes elevated (>115 bpm). Diastolic blood pressure (lower number) rises above 100 mmHg. Patients find relief in laying down and not moving.   Severe pain 5 Intense and extremely unpleasant. Associated with frowning face and frequent crying. Pain overwhelms the senses.  Ability to do any activity or maintain social relationships becomes significantly limited. Conversation becomes  difficult. Pacing back and forth is common, as getting into a comfortable position is nearly impossible. Pain wakes you up from deep sleep. Physical signs will be obvious: pupillary dilation; increased sweating; goosebumps; brisk reflexes; cold, clammy hands and feet; nausea, vomiting or dry heaves; loss of appetite; significant sleep disturbance with inability to fall asleep or to remain asleep. When persistent, significant weight loss is observed due to the complete loss of appetite and sleep deprivation.  Blood pressure and heart rate becomes significantly elevated. Caution: If elevated blood pressure triggers a pounding headache associated with blurred vision, then the patient should immediately seek attention at an urgent or emergency care unit, as these may be signs of an impending stroke.    Emergency Department Pain Levels (6-10/10)  Emergency Room Pain 6 Severely limiting. Requires emergency care and should not be seen or managed at an outpatient pain management facility. Communication becomes difficult and requires great effort. Assistance to reach the emergency department may be required. Facial flushing and profuse sweating along with potentially dangerous increases in heart rate and blood pressure will be evident.   Distressing pain 7 Self-care is very difficult. Assistance is required to transport, or use restroom. Assistance to reach the emergency department will be required. Tasks requiring coordination, such as bathing and getting dressed become very difficult.   Disabling pain 8 Self-care is no longer possible. At this level, pain is disabling. The individual is unable to do even the most "basic" activities such as walking, eating, bathing, dressing, transferring to a bed, or toileting. Fine motor skills are lost. It is difficult to think clearly.   Incapacitating pain 9 Pain becomes incapacitating. Thought processing is no longer possible. Difficult to remember your own name. Control  of  movement and coordination are lost.   The worst pain imaginable 10 At this level, most patients pass out from pain. When this level is reached, collapse of the autonomic nervous system occurs, leading to a sudden drop in blood pressure and heart rate. This in turn results in a temporary and dramatic drop in blood flow to the brain, leading to a loss of consciousness. Fainting is one of the body's self defense mechanisms. Passing out puts the brain in a calmed state and causes it to shut down for a while, in order to begin the healing process.    Summary: 1. Refer to this scale when providing [place] with your pain level. 2. Be accurate and careful when reporting your pain level. This will help with your care. 3. Over-reporting your pain level will lead to loss of credibility. 4. Even a level of 1/10 means that there is pain and will be treated at our facility. 5. High, inaccurate reporting will be documented as "Symptom Exaggeration", leading to loss of credibility and suspicions of possible secondary gains such as obtaining more narcotics, or wanting to appear disabled, for fraudulent reasons. 6. Only pain levels of 5 or below will be seen at our facility. 7. Pain levels of 6 and above will be sent to the Emergency Department and the appointment cancelled. _____________________________________________________________________________________________

## 2017-02-18 NOTE — Telephone Encounter (Signed)
Spoken and notified patient of Kate's comments. Patient verbalized understanding. 

## 2017-02-18 NOTE — Patient Instructions (Addendum)
Rx hydrocodone 5 mg IR x 3 months to begin filling on 02/19/17 given to the patient.  Pain Score  Introduction: The pain score used by this practice is the Verbal Numerical Rating Scale (VNRS-11). This is an 11-point scale. It is for adults and children 10 years or older. There are significant differences in how the pain score is reported, used, and applied. Forget everything you learned in the past and learn this scoring system.  General Information: The scale should reflect your current level of pain. Unless you are specifically asked for the level of your worst pain, or your average pain. If you are asked for one of these two, then it should be understood that it is over the past 24 hours.  Basic Activities of Daily Living (ADL): Personal hygiene, dressing, eating, transferring, and using restroom.  Instructions: Most patients tend to report their level of pain as a combination of two factors, their physical pain and their psychosocial pain. This last one is also known as "suffering" and it is reflection of how physical pain affects you socially and psychologically. From now on, report them separately. From this point on, when asked to report your pain level, report only your physical pain. Use the following table for reference.  Pain Clinic Pain Levels (0-5/10)  Pain Level Score Description  No Pain 0   Mild pain 1 Nagging, annoying, but does not interfere with basic activities of daily living (ADL). Patients are able to eat, bathe, get dressed, toileting (being able to get on and off the toilet and perform personal hygiene functions), transfer (move in and out of bed or a chair without assistance), and maintain continence (able to control bladder and bowel functions). Blood pressure and heart rate are unaffected. A normal heart rate for a healthy adult ranges from 60 to 100 bpm (beats per minute).   Mild to moderate pain 2 Noticeable and distracting. Impossible to hide from other people. More  frequent flare-ups. Still possible to adapt and function close to normal. It can be very annoying and may have occasional stronger flare-ups. With discipline, patients may get used to it and adapt.   Moderate pain 3 Interferes significantly with activities of daily living (ADL). It becomes difficult to feed, bathe, get dressed, get on and off the toilet or to perform personal hygiene functions. Difficult to get in and out of bed or a chair without assistance. Very distracting. With effort, it can be ignored when deeply involved in activities.   Moderately severe pain 4 Impossible to ignore for more than a few minutes. With effort, patients may still be able to manage work or participate in some social activities. Very difficult to concentrate. Signs of autonomic nervous system discharge are evident: dilated pupils (mydriasis); mild sweating (diaphoresis); sleep interference. Heart rate becomes elevated (>115 bpm). Diastolic blood pressure (lower number) rises above 100 mmHg. Patients find relief in laying down and not moving.   Severe pain 5 Intense and extremely unpleasant. Associated with frowning face and frequent crying. Pain overwhelms the senses.  Ability to do any activity or maintain social relationships becomes significantly limited. Conversation becomes difficult. Pacing back and forth is common, as getting into a comfortable position is nearly impossible. Pain wakes you up from deep sleep. Physical signs will be obvious: pupillary dilation; increased sweating; goosebumps; brisk reflexes; cold, clammy hands and feet; nausea, vomiting or dry heaves; loss of appetite; significant sleep disturbance with inability to fall asleep or to remain asleep. When persistent, significant weight  loss is observed due to the complete loss of appetite and sleep deprivation.  Blood pressure and heart rate becomes significantly elevated. Caution: If elevated blood pressure triggers a pounding headache associated with  blurred vision, then the patient should immediately seek attention at an urgent or emergency care unit, as these may be signs of an impending stroke.    Emergency Department Pain Levels (6-10/10)  Emergency Room Pain 6 Severely limiting. Requires emergency care and should not be seen or managed at an outpatient pain management facility. Communication becomes difficult and requires great effort. Assistance to reach the emergency department may be required. Facial flushing and profuse sweating along with potentially dangerous increases in heart rate and blood pressure will be evident.   Distressing pain 7 Self-care is very difficult. Assistance is required to transport, or use restroom. Assistance to reach the emergency department will be required. Tasks requiring coordination, such as bathing and getting dressed become very difficult.   Disabling pain 8 Self-care is no longer possible. At this level, pain is disabling. The individual is unable to do even the most "basic" activities such as walking, eating, bathing, dressing, transferring to a bed, or toileting. Fine motor skills are lost. It is difficult to think clearly.   Incapacitating pain 9 Pain becomes incapacitating. Thought processing is no longer possible. Difficult to remember your own name. Control of movement and coordination are lost.   The worst pain imaginable 10 At this level, most patients pass out from pain. When this level is reached, collapse of the autonomic nervous system occurs, leading to a sudden drop in blood pressure and heart rate. This in turn results in a temporary and dramatic drop in blood flow to the brain, leading to a loss of consciousness. Fainting is one of the body's self defense mechanisms. Passing out puts the brain in a calmed state and causes it to shut down for a while, in order to begin the healing process.    Summary: 1. Refer to this scale when providing [place] with your pain level. 2. Be accurate and careful  when reporting your pain level. This will help with your care. 3. Over-reporting your pain level will lead to loss of credibility. 4. Even a level of 1/10 means that there is pain and will be treated at our facility. 5. High, inaccurate reporting will be documented as "Symptom Exaggeration", leading to loss of credibility and suspicions of possible secondary gains such as obtaining more narcotics, or wanting to appear disabled, for fraudulent reasons. 6. Only pain levels of 5 or below will be seen at our facility. 7. Pain levels of 6 and above will be sent to the Emergency Department and the appointment cancelled. _____________________________________________________________________________________________

## 2017-02-24 LAB — TOXASSURE SELECT 13 (MW), URINE

## 2017-05-03 NOTE — Progress Notes (Signed)
Patient's Name: Larry Goodwin  MRN: [id number]  Referring Provider: Ria Bush, MD  DOB: 1976/10/29  PCP: Ria Bush, MD  DOS: 05/17/2017  Note by: Vevelyn Francois NP  Service setting: Ambulatory outpatient  Specialty: Interventional Pain Management  Location: [facility] (AMB) Pain Management Facility    Patient type: Established    Primary Reason(s) for Visit: Encounter for prescription drug management (Level of risk: moderate) CC: Back Pain (lower)  HPI  [name] is a 41 y.o. year old, male patient, who comes today for a medication management evaluation. He has Acute sinusitis; Lumbar DDD (degenerative disc disease); HTN (hypertension); Bell's palsy; Severe obesity (BMI 35.0-35.9 with comorbidity) ([place]); Chronic pain syndrome; Medicare annual wellness visit, initial; Type 2 diabetes, diet controlled ([place]); History of tobacco abuse; Low magnesium levels; Vitamin D deficiency; Myofascial pain syndrome; Lumbar facet syndrome (Bilateral) (R>L); Chronic upper back pain (Location of Tertiary source of pain); Generalized anxiety disorder; Depression; Lumbar spondylosis with radiculopathy; Epidural fibrosis; Failed back surgical syndrome; Encounter for therapeutic drug level monitoring; Long term prescription opiate use; Long term current use of opiate analgesic; Opiate use (30 MME/Day); Chronic low back pain (Location of Primary Source of Pain) (Bilateral) (R>L); Chronic sacroiliac joint pain (Right); Abnormal MRI, lumbar spine;  Abnormal CT myelogram of the thoracic spine; Thoracic disc herniation; Bulge of lumbar disc without myelopathy; Vertebral body hemangioma; Disturbance of skin sensation; Cervical IVDD (intervertebral disc displacement); 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 His primarily concern today is the Back Pain (lower)  Pain Assessment: Self-Reported Pain Score: 2 /10             Reported level is compatible with observation.       Pain Type: Chronic pain Pain Location: Back Pain Orientation: Lower, Right, Left Pain Descriptors / Indicators: Constant Pain Frequency: Constant  [name] was last scheduled for an appointment on 02/18/17 for medication management. During today's appointment we reviewed Mr. Kliewer's chronic pain status, as well as his outpatient medication regimen. He has chronic low back pain. He has radicular symptoms that do down into his buttock. He denies any numbness tingling or weakness. He denies any side effects of his medication. He denies any concerns today. He states that he is losing weight. He states that he has had positive attitude. He states that he tries to walk but does have increased pain.  The patient  reports that he does not use drugs. His body mass index is unknown because there is no height or weight on file.  Further details on both, my assessment(s), as well as the proposed treatment plan, please see below.  Controlled Substance Pharmacotherapy Assessment REMS (Risk Evaluation and Mitigation Strategy)  Analgesic:Oxycodone IR 5 mg every 6 hours (4 per day) (20 mg/day) MME/day:30 mg/day.  Ignatius Specking, RN  05/17/2017  9:16 AM  Sign at close encounter Nursing Pain Medication Assessment:  Safety precautions to be maintained throughout the outpatient stay will include: orient to surroundings, keep bed in low position, maintain call bell within reach at all times, provide assistance with  transfer out  of bed and ambulation.  Medication Inspection Compliance: Pill count conducted under aseptic conditions, in front of the patient. Neither the pills nor the bottle was removed from the patient's sight at any time. Once count was completed pills were immediately returned to the patient in their original bottle.  Medication: See above Pill/Patch Count: 11 of 120 pills remain Pill/Patch Appearance: Markings consistent with prescribed medication Bottle Appearance: Standard pharmacy container. Clearly labeled. Filled Date: 5 / 08 / 2018 Last Medication intake:  Today   Pharmacokinetics: Liberation and absorption (onset of action): WNL Distribution (time to peak effect): WNL Metabolism and excretion (duration of action): WNL         Pharmacodynamics: Desired effects: Analgesia: Mr. Hern reports >50% benefit. Functional ability: Patient reports that medication allows him to accomplish basic ADLs Clinically meaningful improvement in function (CMIF): Sustained CMIF goals met Perceived effectiveness: Described as relatively effective, allowing for increase in activities of daily living (ADL) Undesirable effects: Side-effects or Adverse reactions: None reported Monitoring: [place] PMP: Online review of the past 72-monthperiod conducted. Compliant with practice rules and regulations List of all UDS test(s) done:  Lab Results  Component Value Date   TOXASSSELUR FINAL 02/18/2017   TOXASSSELUR FINAL 06/09/2016   TOXASSSELUR FINAL 03/25/2016   TOXASSSELUR FINAL 12/30/2015   Last UDS on record: ToxAssure Select 13  Date Value Ref Range Status  02/18/2017 FINAL  Final    Comment:    ==================================================================== TOXASSURE SELECT 13 (MW) ==================================================================== Test                             Result       Flag       Units Drug Present and Declared for Prescription Verification   Oxycodone                       1528         EXPECTED   ng/mg creat   Oxymorphone                    543          EXPECTED   ng/mg creat   Noroxycodone                   2373         EXPECTED   ng/mg creat   Noroxymorphone                 173          EXPECTED   ng/mg creat    Sources of oxycodone are scheduled prescription medications.    Oxymorphone, noroxycodone, and noroxymorphone are expected    metabolites of oxycodone. Oxymorphone is also available as a    scheduled prescription medication. ==================================================================== Test                      Result    Flag   Units      Ref Range   Creatinine              198              mg/dL      >=20 ==================================================================== Declared Medications:  The flagging and interpretation on this report are based on the  following declared medications.  Unexpected results may arise from  inaccuracies in the declared medications.  **Note: The testing scope of this  panel includes these medications:  Oxycodone  **Note: The testing scope of this panel does not include following  reported medications:  Amoxicillin (Augmentin)  Clavulinate (Augmentin)  Cyanocobalamin  Lisinopril  Magnesium (Mag-Ox)  Vitamin D3 ==================================================================== For clinical consultation, please call [phone]. ====================================================================    UDS interpretation: Compliant          Medication Assessment Form: Reviewed. Patient indicates being compliant with therapy Treatment compliance: Compliant Risk Assessment Profile: Aberrant behavior: See prior evaluations. None observed or detected today Comorbid factors increasing risk of overdose: See prior notes. No additional risks detected today Risk of substance use disorder (SUD): Low Opioid Risk Tool (ORT) Total Score: 1  Interpretation Table:  Score <3 = Low Risk for SUD   Score between 4-7 = Moderate Risk for SUD  Score >8 = High Risk for Opioid Abuse   Risk Mitigation Strategies:  Patient Counseling: Covered Patient-Prescriber Agreement (PPA): Present and active  Notification to other healthcare providers: Done  Pharmacologic Plan: No change in therapy, at this time  Laboratory Chemistry  Inflammation Markers Lab Results  Component Value Date   CRP 0.8 06/09/2016   ESRSEDRATE 6 06/09/2016   (CRP: Acute Phase) (ESR: Chronic Phase) Renal Function Markers Lab Results  Component Value Date   BUN 13 02/01/2017   CREATININE 0.79 02/01/2017   GFRAA >60 06/09/2016   GFRNONAA >60 06/09/2016   Hepatic Function Markers Lab Results  Component Value Date   AST 11 02/01/2017   ALT 10 02/01/2017   ALBUMIN 4.2 02/01/2017   ALKPHOS 49 02/01/2017   Electrolytes Lab Results  Component Value Date   NA 139 02/01/2017   K 4.8 02/01/2017   CL 101 02/01/2017   CALCIUM 8.8 02/01/2017   MG 1.7 02/01/2017   Neuropathy Markers Lab Results  Component Value Date   VITAMINB12 186 (L) 02/01/2017   Bone Pathology Markers Lab Results  Component Value Date   ALKPHOS 49 02/01/2017   VD25OH 14.49 (L) 02/01/2017   25OHVITD1 31 06/09/2016   25OHVITD2 6.6 06/09/2016   25OHVITD3 24 06/09/2016   CALCIUM 8.8 02/01/2017   Coagulation Parameters Lab Results  Component Value Date   PLT 240 08/03/2014   Cardiovascular Markers Lab Results  Component Value Date   HGB 14.9 08/03/2014   HCT 43.9 08/03/2014   Note: Lab results reviewed.  Recent Diagnostic Imaging Review  No results found. Note: Imaging results reviewed.          Meds  The patient has a current medication list which includes the following prescription(s): vitamin d3, lisinopril, magnesium oxide, vitamin b-12, oxycodone, oxycodone, and oxycodone.  Current Outpatient Prescriptions on File Prior to Visit  Medication Sig  . Cholecalciferol (VITAMIN D3) 2000 units capsule Take 1 capsule  (2,000 Units total) by mouth daily. (Patient taking differently: Take 4,000 Units by mouth daily. )  . lisinopril (PRINIVIL,ZESTRIL) 40 MG tablet Take 1 tablet (40 mg total) by mouth daily.  . Magnesium Oxide 500 MG CAPS Take 1 capsule (500 mg total) by mouth daily.  . vitamin B-12 (CYANOCOBALAMIN) 1000 MCG tablet Take 1,000 mcg by mouth daily.   No current facility-administered medications on file prior to visit.    ROS  Constitutional: Denies any fever or chills Gastrointestinal: No reported hemesis, hematochezia, vomiting, or acute GI distress Musculoskeletal: Denies any acute onset joint swelling, redness, loss of ROM, or weakness Neurological: No reported episodes of acute onset apraxia, aphasia, dysarthria, agnosia, amnesia, paralysis, loss of coordination, or loss of consciousness  Allergies  Mr. Zeitlin is allergic to levaquin [levofloxacin in d5w].  [place]  Drug: Mr. Xiang  reports that he does not use drugs. Alcohol:  reports that he does not drink alcohol. Tobacco:  reports that he quit smoking about 9 years ago. He has never used smokeless tobacco. Medical:  has a past medical history of Bell's palsy (2015); Chronic pain syndrome; DDD (degenerative disc disease), lumbar; HTN (hypertension); and Morbid obesity ([place]) (08/16/2014). Family: family history includes COPD in his mother; Hypertension in his other.  Past Surgical History:  Procedure Laterality Date  . BACK SURGERY  2012   x 2 [place])  . CHOLECYSTECTOMY     Constitutional Exam  General appearance: Well nourished, well developed, and well hydrated. In no apparent acute distress Vitals:   05/17/17 0904  BP: (!) 133/94  Pulse: 70  Resp: 16  Temp: 97.7 F (36.5 C)  SpO2: 100%  Height: _0  (1.88 m)   BMI Assessment: Estimated body mass index is 35.82 kg/m as calculated from the following:   Height as of 02/18/17: _1  (1.88 m).   Weight as of 02/18/17: 279 lb (126.6 kg).  BMI interpretation table: BMI level  Category Range association with higher incidence of chronic pain  <18 kg/m2 Underweight   18.5-24.9 kg/m2 Ideal body weight   25-29.9 kg/m2 Overweight Increased incidence by 20%  30-34.9 kg/m2 Obese (Class I) Increased incidence by 68%  35-39.9 kg/m2 Severe obesity (Class II) Increased incidence by 136%  >40 kg/m2 Extreme obesity (Class III) Increased incidence by 254%   BMI Readings from Last 4 Encounters:  02/18/17 35.82 kg/m  02/04/17 35.85 kg/m  11/20/16 33.25 kg/m  09/22/16 32.10 kg/m   Wt Readings from Last 4 Encounters:  02/18/17 279 lb (126.6 kg)  02/04/17 279 lb 4 oz (126.7 kg)  11/20/16 266 lb (120.7 kg)  09/22/16 250 lb (113.4 kg)  Psych/Mental status: Alert, oriented x 3 (person, place, & time)       Eyes: PERLA Respiratory: No evidence of acute respiratory distress  Lumbar Spine Exam  Inspection: No masses, redness, or swelling Alignment: Symmetrical Functional ROM: Unrestricted ROM      Stability: No instability detected Muscle strength & Tone: Functionally intact Sensory: Unimpaired Palpation: Complains of area being tender to palpation       Provocative Tests: Lumbar Hyperextension and rotation test: Positive bilaterally for facet joint pain. Patrick's Maneuver: evaluation deferred today                    Gait & Posture Assessment  Ambulation: Unassisted Gait: Relatively normal for age and body habitus Posture: WNL   Lower Extremity Exam    Side: Right lower extremity  Side: Left lower extremity  Inspection: No masses, redness, swelling, or asymmetry. No contractures  Inspection: No masses, redness, swelling, or asymmetry. No contractures  Functional ROM: Unrestricted ROM          Functional ROM: Unrestricted ROM          Muscle strength & Tone: Functionally intact  Muscle strength & Tone: Functionally intact  Sensory: Unimpaired  Sensory: Unimpaired  Palpation: No palpable anomalies  Palpation: No palpable anomalies   Assessment  Primary  Diagnosis & Pertinent Problem List: The primary encounter diagnosis was Lumbar DDD (degenerative disc disease). 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  Status Diagnosis  Controlled Controlled Controlled 1. Lumbar DDD (degenerative disc disease)  2. 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 Lumbar facet syndrome (Bilateral) (R>L)   4. Chronic pain syndrome     Problems updated and reviewed during this visit: No problems updated. Plan of Care  Pharmacotherapy (Medications Ordered): Meds ordered this encounter  Medications  . oxyCODONE (OXY IR/ROXICODONE) 5 MG immediate release tablet    Sig: Take 1 tablet (5 mg total) by mouth every 6 (six) hours as needed for severe pain.    Dispense:  120 tablet    Refill:  0    Do not place this medication, or any other prescription from our practice, on "Automatic Refill". Patient may have prescription filled one day early if pharmacy is closed on scheduled refill date. Do not fill until: 07/19/17 To last until: 08/18/17    Order Specific Question:   Supervising Provider    Answer:   Milinda Pointer [phone]  . oxyCODONE (OXY IR/ROXICODONE) 5 MG immediate release tablet    Sig: Take 1 tablet (5 mg total) by mouth every 6 (six) hours as needed for severe pain.    Dispense:  120 tablet    Refill:  0    Do not place this medication, or any other prescription from our practice, on "Automatic Refill". Patient may have prescription filled one day early if pharmacy is closed on scheduled refill date. Do not fill until: 05/20/17 To last until: 06/19/17    Order Specific Question:   Supervising Provider    Answer:   Milinda Pointer [phone]  . oxyCODONE (OXY IR/ROXICODONE) 5 MG immediate release tablet    Sig: Take 1 tablet (5 mg total) by mouth every 6 (six) hours as needed for severe pain.    Dispense:  120 tablet    Refill:  0     Do not place this medication, or any other prescription from our practice, on "Automatic Refill". Patient may have prescription filled one day early if pharmacy is closed on scheduled refill date. Do not fill until: 06/19/17 To last until: 07/19/17    Order Specific Question:   Supervising Provider    Answer:   Milinda Pointer [phone]   New Prescriptions   No medications on file   Medications administered today: Mr. Zangara had no medications administered during this visit. Lab-work, procedure(s), and/or referral(s): No orders of the defined types were placed in this encounter.  Imaging and/or referral(s): None  Interventional therapies: Planned, scheduled, and/or pending:   Continue with current regimen  Will add Motrin, Advil or Aleve with his activity Continue with the weight loss    Considering:   Diagnostic right-sided cervical epidural steroid injection  Diagnostic thoracic epidural steroid injection  Diagnostic bilateral thoracic facet block  Possible bilateral thoracic facet radiofrequency ablation  Diagnostic bilateral lumbar facet block  Possible bilateral lumbar facet radiofrequency ablation  Diagnostic right sacroiliac joint block  Possible right sacroiliac joint radiofrequency ablation  Diagnostic caudal epidural steroid injection and epidurogram  Diagnostic right-sided L4-5 transforaminal epidural steroid injection  Diagnostic right intra-articular knee injection  Diagnostic right genicular nerve block  Possible right genicular radiofrequency ablation    Palliative PRN treatment(s):   Palliative right-sided cervical epidural steroid injection  Palliative thoracic epidural steroid injection  Palliative bilateral thoracic facet block  Palliative bilateral lumbar facet block  Palliative right sacroiliac joint block  Palliative caudal epidural steroid injection and epidurogram   Palliative right-sided L4-5 transforaminal epidural steroid injection   Palliativeright intra-articular knee injection  Palliative right genicular nerve block    Provider-requested  follow-up: Return in about 3 months (around 08/17/2017) for Medication Mgmt.  Future Appointments Date Time Provider [place]  08/05/2017 10:00 AM Ria Bush, MD [id number]   Primary Care Physician: Ria Bush, MD Location: [facility] Out[facility] Management Facility Note by: Vevelyn Francois NP Date: 05/17/2017; Time: 9:43 AM  Pain Score Disclaimer: We use the NRS-11 scale. This is a self-reported, subjective measurement of pain severity with only modest accuracy. It is used primarily to identify changes within a particular patient. It must be understood that outpatient pain scales are significantly less accurate that those used for research, where they can be applied under ideal controlled circumstances with minimal exposure to variables. In reality, the score is likely to be a combination of pain intensity and pain affect, where pain affect describes the degree of emotional arousal or changes in action readiness caused by the sensory experience of pain. Factors such as social and work situation, setting, emotional state, anxiety levels, expectation, and prior pain experience may influence pain perception and show large inter-individual differences that may also be affected by time variables.  Patient instructions provided during this appointment: Patient Instructions   ____________________________________________________________________________________________  Medication Rules  Applies to: All patients receiving prescriptions (written or electronic).  Pharmacy of record: Pharmacy where electronic prescriptions will be sent. If written prescriptions are taken to a different pharmacy, please inform the nursing staff. The pharmacy listed in the electronic medical record should be the one where you would like electronic prescriptions to be  sent.  Prescription refills: Only during scheduled appointments. Applies to both, written and electronic prescriptions.  NOTE: The following applies primarily to controlled substances (Opioid Pain Medications)  Patient's responsibilities: 1. Pain Pills: Bring all pain pills to every appointment (except for procedure appointments). 2. Pill Bottles: Bring pills in original pharmacy bottle. Always bring newest bottle. Bring bottle, even if empty. 3. Medication refills: You are responsible for knowing and keeping track of what medications you need refilled. The day before your appointment, write a list of all prescriptions that need to be refilled. Bring that list to your appointment and give it to the admitting nurse. Prescriptions will be written only during appointments. If you forget a medication, it will not be "Called in", "Faxed", or "electronically sent". You will need to get another appointment to get these prescribed. 4. Prescription Accuracy: You are responsible for carefully inspecting your prescriptions before leaving our office. Have the discharge nurse carefully go over each prescription with you, before taking them home. Make sure that your name is accurately spelled, that your address is correct. Check the name and dose of your medication to make sure it is accurate. Check the number of pills, and the written instructions to make sure they are clear and accurate. Make sure that you are given enough medication to last until your next medication refill appointment. 5. Taking Medication: Take medication as prescribed. Never take more pills than instructed. Never take medication more frequently than prescribed. Taking less pills or less frequently is permitted and encouraged, when it comes to controlled substances (written prescriptions).  6. Inform other Doctors: Always inform, all of your healthcare providers, of all the medications you take. 7. Pain Medication from other Providers: You are  not allowed to accept any additional pain medication from any other Doctor or Healthcare provider. There are two exceptions to this rule. (see below) In the event that you require additional pain medication, you are responsible for notifying us, as stated below. 8. Medication Agreement:  You are responsible for carefully reading and following our Medication Agreement. This must be signed before receiving any prescriptions from our practice. Safely store a copy of your signed Agreement. Violations to the Agreement will result in no further prescriptions. (Additional copies of our Medication Agreement are available upon request.) 9. Laws, Rules, & Regulations: All patients are expected to follow all Federal and [employer], Rules, [employer]. Ignorance of the Laws does not constitute a valid excuse.  Exceptions: There are only two exceptions to the rule of not receiving pain medications from other Healthcare Providers. 1. Exception #1 (Emergencies): In the event of an emergency (i.e.: accident requiring emergency care), you are allowed to receive additional pain medication. However, you are responsible for: As soon as you are able, call our office [phone], at any time of the day or night, and leave a message stating your name, the date and nature of the emergency, and the name and dose of the medication prescribed. In the event that your call is answered by a member of our staff, make sure to document and save the date, time, and the name of the person that took your information.  2. Exception #2 (Planned Surgery): In the event that you are scheduled by another doctor or dentist to have any type of surgery or procedure, you are allowed (for a period no longer than 30 days), to receive additional pain medication, for the acute post-op pain. However, in this case, you are responsible for picking up a copy of our "Post-op Pain Management for Surgeons" handout, and giving it to your surgeon or  dentist. This document is available at our office, and does not require an appointment to obtain it. Simply go to our office during business hours (Monday-Thursday from 8:00 AM to 4:00 PM) (Friday 8:00 AM to 12:00 Noon) or if you have a scheduled appointment with [place], prior to your surgery, and ask for it by name. In addition, you will need to provide [place] with your name, name of your surgeon, type of surgery, and date of procedure or surgery.  ____________________________________________________________________________________________

## 2017-05-12 ENCOUNTER — Ambulatory Visit: Payer: Medicare Other | Admitting: Nurse Practitioner

## 2017-05-17 ENCOUNTER — Encounter: Payer: Self-pay | Admitting: Nurse Practitioner

## 2017-05-17 ENCOUNTER — Ambulatory Visit: Payer: Medicare Other | Attending: Nurse Practitioner | Admitting: Nurse Practitioner

## 2017-05-17 VITALS — BP 133/94 | HR 70 | Temp 97.7°F | Resp 16 | Ht 74.0 in

## 2017-05-17 DIAGNOSIS — M9983 Other biomechanical lesions of lumbar region: Secondary | ICD-10-CM | POA: Diagnosis not present

## 2017-05-17 DIAGNOSIS — E538 Deficiency of other specified B group vitamins: Secondary | ICD-10-CM | POA: Diagnosis not present

## 2017-05-17 DIAGNOSIS — F411 Generalized anxiety disorder: Secondary | ICD-10-CM | POA: Insufficient documentation

## 2017-05-17 DIAGNOSIS — M4696 Unspecified inflammatory spondylopathy, lumbar region: Secondary | ICD-10-CM

## 2017-05-17 DIAGNOSIS — M5136 Other intervertebral disc degeneration, lumbar region: Secondary | ICD-10-CM | POA: Diagnosis not present

## 2017-05-17 DIAGNOSIS — G510 Bell's palsy: Secondary | ICD-10-CM | POA: Insufficient documentation

## 2017-05-17 DIAGNOSIS — E559 Vitamin D deficiency, unspecified: Secondary | ICD-10-CM | POA: Diagnosis not present

## 2017-05-17 DIAGNOSIS — F329 Major depressive disorder, single episode, unspecified: Secondary | ICD-10-CM | POA: Diagnosis not present

## 2017-05-17 DIAGNOSIS — Z79891 Long term (current) use of opiate analgesic: Secondary | ICD-10-CM | POA: Diagnosis not present

## 2017-05-17 DIAGNOSIS — E119 Type 2 diabetes mellitus without complications: Secondary | ICD-10-CM | POA: Diagnosis not present

## 2017-05-17 DIAGNOSIS — Z87891 Personal history of nicotine dependence: Secondary | ICD-10-CM | POA: Insufficient documentation

## 2017-05-17 DIAGNOSIS — Z6835 Body mass index (BMI) 35.0-35.9, adult: Secondary | ICD-10-CM | POA: Diagnosis not present

## 2017-05-17 DIAGNOSIS — I10 Essential (primary) hypertension: Secondary | ICD-10-CM | POA: Insufficient documentation

## 2017-05-17 DIAGNOSIS — M545 Low back pain: Secondary | ICD-10-CM | POA: Diagnosis not present

## 2017-05-17 DIAGNOSIS — Z79899 Other long term (current) drug therapy: Secondary | ICD-10-CM | POA: Insufficient documentation

## 2017-05-17 DIAGNOSIS — M48061 Spinal stenosis, lumbar region without neurogenic claudication: Secondary | ICD-10-CM | POA: Diagnosis not present

## 2017-05-17 DIAGNOSIS — Z5181 Encounter for therapeutic drug level monitoring: Secondary | ICD-10-CM | POA: Diagnosis not present

## 2017-05-17 DIAGNOSIS — G894 Chronic pain syndrome: Secondary | ICD-10-CM | POA: Diagnosis not present

## 2017-05-17 DIAGNOSIS — M47816 Spondylosis without myelopathy or radiculopathy, lumbar region: Secondary | ICD-10-CM

## 2017-05-17 MED ORDER — OXYCODONE HCL 5 MG PO TABS: 5.0000 mg | ORAL_TABLET | Freq: Four times a day (QID) | ORAL | 0 refills | Status: DC | PRN

## 2017-05-17 NOTE — Progress Notes (Signed)
Nursing Pain Medication Assessment:  Safety precautions to be maintained throughout the outpatient stay will include: orient to surroundings, keep bed in low position, maintain call bell within reach at all times, provide assistance with transfer out of bed and ambulation.  Medication Inspection Compliance: Pill count conducted under aseptic conditions, in front of the patient. Neither the pills nor the bottle was removed from the patient's sight at any time. Once count was completed pills were immediately returned to the patient in their original bottle.  Medication: See above Pill/Patch Count: 11 of 120 pills remain Pill/Patch Appearance: Markings consistent with prescribed medication Bottle Appearance: Standard pharmacy container. Clearly labeled. Filled Date: 5 / 08 / 2018 Last Medication intake:  Today

## 2017-05-17 NOTE — Patient Instructions (Signed)

## 2017-08-05 ENCOUNTER — Ambulatory Visit: Payer: Medicare Other | Admitting: Family Medicine

## 2017-08-12 ENCOUNTER — Other Ambulatory Visit: Payer: Self-pay | Admitting: Family Medicine

## 2017-08-12 DIAGNOSIS — E119 Type 2 diabetes mellitus without complications: Secondary | ICD-10-CM

## 2017-08-12 DIAGNOSIS — E559 Vitamin D deficiency, unspecified: Secondary | ICD-10-CM

## 2017-08-12 DIAGNOSIS — E538 Deficiency of other specified B group vitamins: Secondary | ICD-10-CM

## 2017-08-12 DIAGNOSIS — R790 Abnormal level of blood mineral: Secondary | ICD-10-CM

## 2017-08-12 DIAGNOSIS — I10 Essential (primary) hypertension: Secondary | ICD-10-CM

## 2017-08-13 ENCOUNTER — Ambulatory Visit: Payer: Medicare Other | Admitting: Family Medicine

## 2017-08-13 ENCOUNTER — Telehealth: Payer: Self-pay | Admitting: Family Medicine

## 2017-08-13 ENCOUNTER — Ambulatory Visit: Payer: Medicare Other

## 2017-08-13 NOTE — Telephone Encounter (Signed)
Would call and offer f/u. Would charge no show fee.

## 2017-08-13 NOTE — Telephone Encounter (Signed)
Patient did not come in for their appointment today. for part 2 [employer] wellness. Please let me know if patient needs to be contacted immediately for follow up or no follow up needed. Do you want to charge the NSF?

## 2017-08-17 ENCOUNTER — Ambulatory Visit: Payer: Medicare Other | Attending: Nurse Practitioner | Admitting: Nurse Practitioner

## 2017-08-17 ENCOUNTER — Encounter: Payer: Self-pay | Admitting: Nurse Practitioner

## 2017-08-17 VITALS — BP 151/96 | HR 80 | Temp 98.1°F | Resp 16 | Ht 74.0 in | Wt 249.0 lb

## 2017-08-17 DIAGNOSIS — M4696 Unspecified inflammatory spondylopathy, lumbar region: Secondary | ICD-10-CM | POA: Diagnosis not present

## 2017-08-17 DIAGNOSIS — M48061 Spinal stenosis, lumbar region without neurogenic claudication: Secondary | ICD-10-CM | POA: Insufficient documentation

## 2017-08-17 DIAGNOSIS — Z6835 Body mass index (BMI) 35.0-35.9, adult: Secondary | ICD-10-CM | POA: Insufficient documentation

## 2017-08-17 DIAGNOSIS — G510 Bell's palsy: Secondary | ICD-10-CM | POA: Insufficient documentation

## 2017-08-17 DIAGNOSIS — J019 Acute sinusitis, unspecified: Secondary | ICD-10-CM | POA: Diagnosis not present

## 2017-08-17 DIAGNOSIS — F411 Generalized anxiety disorder: Secondary | ICD-10-CM | POA: Insufficient documentation

## 2017-08-17 DIAGNOSIS — Z79899 Other long term (current) drug therapy: Secondary | ICD-10-CM | POA: Diagnosis not present

## 2017-08-17 DIAGNOSIS — M5136 Other intervertebral disc degeneration, lumbar region: Secondary | ICD-10-CM | POA: Insufficient documentation

## 2017-08-17 DIAGNOSIS — M4802 Spinal stenosis, cervical region: Secondary | ICD-10-CM | POA: Diagnosis not present

## 2017-08-17 DIAGNOSIS — E559 Vitamin D deficiency, unspecified: Secondary | ICD-10-CM | POA: Diagnosis not present

## 2017-08-17 DIAGNOSIS — I10 Essential (primary) hypertension: Secondary | ICD-10-CM | POA: Diagnosis not present

## 2017-08-17 DIAGNOSIS — M47816 Spondylosis without myelopathy or radiculopathy, lumbar region: Secondary | ICD-10-CM

## 2017-08-17 DIAGNOSIS — M545 Low back pain: Secondary | ICD-10-CM | POA: Diagnosis not present

## 2017-08-17 DIAGNOSIS — G894 Chronic pain syndrome: Secondary | ICD-10-CM | POA: Diagnosis not present

## 2017-08-17 DIAGNOSIS — Z87891 Personal history of nicotine dependence: Secondary | ICD-10-CM | POA: Insufficient documentation

## 2017-08-17 DIAGNOSIS — F329 Major depressive disorder, single episode, unspecified: Secondary | ICD-10-CM | POA: Insufficient documentation

## 2017-08-17 DIAGNOSIS — Z5181 Encounter for therapeutic drug level monitoring: Secondary | ICD-10-CM | POA: Diagnosis not present

## 2017-08-17 DIAGNOSIS — M9983 Other biomechanical lesions of lumbar region: Secondary | ICD-10-CM | POA: Diagnosis not present

## 2017-08-17 DIAGNOSIS — E119 Type 2 diabetes mellitus without complications: Secondary | ICD-10-CM | POA: Insufficient documentation

## 2017-08-17 MED ORDER — OXYCODONE HCL 5 MG PO TABS: 5.0000 mg | ORAL_TABLET | Freq: Four times a day (QID) | ORAL | 0 refills | Status: DC | PRN

## 2017-08-17 NOTE — Progress Notes (Signed)
Nursing Pain Medication Assessment:  Safety precautions to be maintained throughout the outpatient stay will include: orient to surroundings, keep bed in low position, maintain call bell within reach at all times, provide assistance with transfer out of bed and ambulation.  Medication Inspection Compliance: Pill count conducted under aseptic conditions, in front of the patient. Neither the pills nor the bottle was removed from the patient's sight at any time. Once count was completed pills were immediately returned to the patient in their original bottle.  Medication: Oxycodone IR Pill/Patch Count: 3 of 120 pills remain Pill/Patch Appearance: Markings consistent with prescribed medication Bottle Appearance: Standard pharmacy container. Clearly labeled. Filled Date: 08 /06 / 2018 Last Medication intake:  Today

## 2017-08-17 NOTE — Patient Instructions (Addendum)
____________________________________________________________________________________________  Medication Rules  Applies to: All patients receiving prescriptions (written or electronic).  Pharmacy of record: Pharmacy where electronic prescriptions will be sent. If written prescriptions are taken to a different pharmacy, please inform the nursing staff. The pharmacy listed in the electronic medical record should be the one where you would like electronic prescriptions to be sent.  Prescription refills: Only during scheduled appointments. Applies to both, written and electronic prescriptions.  NOTE: The following applies primarily to controlled substances (Opioid* Pain Medications).   Patient's responsibilities: 1. Pain Pills: Bring all pain pills to every appointment (except for procedure appointments). 2. Pill Bottles: Bring pills in original pharmacy bottle. Always bring newest bottle. Bring bottle, even if empty. 3. Medication refills: You are responsible for knowing and keeping track of what medications you need refilled. The day before your appointment, write a list of all prescriptions that need to be refilled. Bring that list to your appointment and give it to the admitting nurse. Prescriptions will be written only during appointments. If you forget a medication, it will not be "Called in", "Faxed", or "electronically sent". You will need to get another appointment to get these prescribed. 4. Prescription Accuracy: You are responsible for carefully inspecting your prescriptions before leaving our office. Have the discharge nurse carefully go over each prescription with you, before taking them home. Make sure that your name is accurately spelled, that your address is correct. Check the name and dose of your medication to make sure it is accurate. Check the number of pills, and the written instructions to make sure they are clear and accurate. Make sure that you are given enough medication to  last until your next medication refill appointment. 5. Taking Medication: Take medication as prescribed. Never take more pills than instructed. Never take medication more frequently than prescribed. Taking less pills or less frequently is permitted and encouraged, when it comes to controlled substances (written prescriptions).  6. Inform other Doctors: Always inform, all of your healthcare providers, of all the medications you take. 7. Pain Medication from other Providers: You are not allowed to accept any additional pain medication from any other Doctor or Healthcare provider. There are two exceptions to this rule. (see below) In the event that you require additional pain medication, you are responsible for notifying us, as stated below. 8. Medication Agreement: You are responsible for carefully reading and following our Medication Agreement. This must be signed before receiving any prescriptions from our practice. Safely store a copy of your signed Agreement. Violations to the Agreement will result in no further prescriptions. (Additional copies of our Medication Agreement are available upon request.) 9. Laws, Rules, & Regulations: All patients are expected to follow all Federal and State Laws, Statutes, Rules, & Regulations. Ignorance of the Laws does not constitute a valid excuse. The use of any illegal substances is prohibited. 10. Adopted CDC guidelines & recommendations: Target dosing levels will be at or below 60 MME/day. Use of benzodiazepines** is not recommended.  Exceptions: There are only two exceptions to the rule of not receiving pain medications from other Healthcare Providers. 1. Exception #1 (Emergencies): In the event of an emergency (i.e.: accident requiring emergency care), you are allowed to receive additional pain medication. However, you are responsible for: As soon as you are able, call our office [phone], at any time of the day or night, and leave a message stating your  name, the date and nature of the emergency, and the name and dose of the medication   prescribed. In the event that your call is answered by a member of our staff, make sure to document and save the date, time, and the name of the person that took your information.  2. Exception #2 (Planned Surgery): In the event that you are scheduled by another doctor or dentist to have any type of surgery or procedure, you are allowed (for a period no longer than 30 days), to receive additional pain medication, for the acute post-op pain. However, in this case, you are responsible for picking up a copy of our "Post-op Pain Management for Surgeons" handout, and giving it to your surgeon or dentist. This document is available at our office, and does not require an appointment to obtain it. Simply go to our office during business hours (Monday-Thursday from 8:00 AM to 4:00 PM) (Friday 8:00 AM to 12:00 Noon) or if you have a scheduled appointment with [place], prior to your surgery, and ask for it by name. In addition, you will need to provide [place] with your name, name of your surgeon, type of surgery, and date of procedure or surgery.  *Opioid medications include: morphine, codeine, oxycodone, oxymorphone, hydrocodone, hydromorphone, meperidine, tramadol, tapentadol, buprenorphine, fentanyl, methadone. **Benzodiazepine medications include: diazepam (Valium), alprazolam (Xanax), clonazepam (Klonopine), lorazepam (Ativan), clorazepate (Tranxene), chlordiazepoxide (Librium), estazolam (Prosom), oxazepam (Serax), temazepam (Restoril), triazolam (Halcion)  ____________________________________________________________________________________________  BMI Assessment: Estimated body mass index is 31.97 kg/m as calculated from the following:   Height as of this encounter: 6\' 2"  (1.88 m).   Weight as of this encounter: 249 lb (112.9 kg).  BMI interpretation table: BMI level Category Range association with higher incidence of chronic pain   <18 kg/m2 Underweight   18.5-24.9 kg/m2 Ideal body weight   25-29.9 kg/m2 Overweight Increased incidence by 20%  30-34.9 kg/m2 Obese (Class I) Increased incidence by 68%  35-39.9 kg/m2 Severe obesity (Class II) Increased incidence by 136%  >40 kg/m2 Extreme obesity (Class III) Increased incidence by 254%   BMI Readings from Last 4 Encounters:  08/17/17 31.97 kg/m  02/18/17 35.82 kg/m  02/04/17 35.85 kg/m  11/20/16 33.25 kg/m   Wt Readings from Last 4 Encounters:  08/17/17 249 lb (112.9 kg)  02/18/17 279 lb (126.6 kg)  02/04/17 279 lb 4 oz (126.7 kg)  11/20/16 266 lb (120.7 kg)

## 2017-08-17 NOTE — Progress Notes (Signed)
Patient's Name: Larry Goodwin  MRN: [id number]  Referring Provider: Ria Bush, MD  DOB: 30-Nov-1976  PCP: Ria Bush, MD  DOS: 08/17/2017  Note by: Vevelyn Francois NP  Service setting: Ambulatory outpatient  Specialty: Interventional Pain Management  Location: [facility] (AMB) Pain Management Facility    Patient type: Established    Primary Reason(s) for Visit: Encounter for prescription drug management. (Level of risk: moderate)  CC: Back Pain (lower)  HPI  Larry Goodwin is a 41 y.o. year old, male patient, who comes today for a medication management evaluation. He has Acute sinusitis; Lumbar DDD (degenerative disc disease); HTN (hypertension); Bell's palsy; Severe obesity (BMI 35.0-35.9 with comorbidity) ([place]); Chronic pain syndrome; Medicare annual wellness visit, initial; Type 2 diabetes, diet controlled ([place]); History of tobacco abuse; Low magnesium levels; Vitamin D deficiency; Myofascial pain syndrome; Lumbar facet syndrome (Bilateral) (R>L); Chronic upper back pain (Location of Tertiary source of pain); Generalized anxiety disorder; Depression; Lumbar spondylosis with radiculopathy; Epidural fibrosis; Failed back surgical syndrome; Encounter for therapeutic drug level monitoring; Long term prescription opiate use; Long term current use of opiate analgesic; Opiate use (30 MME/Day); Chronic low back pain (Location of Primary Source of Pain) (Bilateral) (R>L); Chronic sacroiliac joint pain (Right); Abnormal MRI, lumbar spine;  Abnormal CT myelogram of the thoracic spine; Thoracic disc herniation; Bulge of lumbar disc without myelopathy; Vertebral body hemangioma; Disturbance of skin sensation; Cervical IVDD (intervertebral disc displacement); 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 His primarily concern today is the Back Pain (lower)  Pain Assessment: Location: Lower Back Radiating: right hip and right butt cheek Onset: More than a month ago Duration: Chronic pain Quality: Aching, Constant Severity: 3 /10 (self-reported pain score)  Note: Reported level is compatible with observation.                   Effect on ADL: pace self Timing: Constant Modifying factors: medicine   and  rest  Larry Goodwin was last scheduled for an appointment on 05/17/2017 for medication management. During today's appointment we reviewed Larry Goodwin's chronic pain status, as well as his outpatient medication regimen.  The patient  reports that he does not use drugs. His body mass index is 31.97 kg/m.  Further details on both, my assessment(s), as well as the proposed treatment plan, please see below.  Controlled Substance Pharmacotherapy Assessment REMS (Risk Evaluation and Mitigation Strategy)  Analgesic:Oxycodone IR 5 mg every 6 hours (4 per day) (20 mg/day) MME/day:30 mg/day.  Lona Millard, RN  08/17/2017  8:50 AM  Sign at close encounter Nursing Pain Medication Assessment:  Safety precautions to be maintained throughout the outpatient stay will include: orient to surroundings, keep bed in low position, maintain call bell within reach at all times, provide assistance with transfer out of bed and ambulation.  Medication Inspection Compliance: Pill count conducted under aseptic conditions, in front of the patient. Neither the pills nor the bottle was removed from the patient's sight at any time. Once count was completed pills were immediately returned to  the patient in their original bottle.  Medication: Oxycodone IR Pill/Patch Count: 3 of 120 pills remain Pill/Patch Appearance: Markings consistent with prescribed medication Bottle Appearance: Standard pharmacy container. Clearly labeled. Filled Date: 08 /06 / 2018 Last Medication intake:  Today   Pharmacokinetics: Liberation and absorption (onset of action): WNL Distribution (time to peak effect): WNL Metabolism and excretion (duration of action): WNL         Pharmacodynamics: Desired effects: Analgesia: Larry Goodwin reports >50% benefit. Functional ability: Patient reports that medication allows him to accomplish basic ADLs Clinically meaningful improvement in function (CMIF): Sustained CMIF goals met Perceived effectiveness: Described as relatively effective, allowing for increase in activities of daily living (ADL) Undesirable effects: Side-effects or Adverse reactions: None reported Monitoring: [place] PMP: Online review of the past 53-monthperiod conducted. Compliant with practice rules and regulations List of all UDS test(s) done:  Lab Results  Component Value Date   TOXASSSELUR FINAL 02/18/2017   TOXASSSELUR FINAL 06/09/2016   TOXASSSELUR FINAL 03/25/2016   TOXASSSELUR FINAL 12/30/2015   Last UDS on record: ToxAssure Select 13  Date Value Ref Range Status  02/18/2017 FINAL  Final    Comment:    ==================================================================== TOXASSURE SELECT 13 (MW) ==================================================================== Test                             Result       Flag       Units Drug Present and Declared for Prescription Verification   Oxycodone                      1528         EXPECTED   ng/mg creat   Oxymorphone                    543          EXPECTED   ng/mg creat   Noroxycodone                   2373         EXPECTED   ng/mg creat   Noroxymorphone                 173          EXPECTED   ng/mg creat    Sources of oxycodone are  scheduled prescription medications.    Oxymorphone, noroxycodone, and noroxymorphone are expected    metabolites of oxycodone. Oxymorphone is also available as a    scheduled prescription medication. ==================================================================== Test                      Result    Flag   Units      Ref Range   Creatinine              198              mg/dL      >=20 ==================================================================== Declared Medications:  The flagging and interpretation on this report are based on the  following declared medications.  Unexpected results may arise from  inaccuracies in the declared medications.  **Note: The testing scope of this panel includes these medications:  Oxycodone  **Note: The testing scope of this panel does not include following  reported medications:  Amoxicillin (Augmentin)  Clavulinate (Augmentin)  Cyanocobalamin  Lisinopril  Magnesium (Mag-Ox)  Vitamin D3 ==================================================================== For clinical consultation, please call ([phone] ====================================================================    UDS interpretation:  Compliant          Medication Assessment Form: Reviewed. Patient indicates being compliant with therapy Treatment compliance: Compliant Risk Assessment Profile: Aberrant behavior: See prior evaluations. None observed or detected today Comorbid factors increasing risk of overdose: See prior notes. No additional risks detected today Risk of substance use disorder (SUD): Low     Opioid Risk Tool - 08/17/17 0847      Family History of Substance Abuse   Alcohol Negative   Illegal Drugs Negative   Rx Drugs Negative     Personal History of Substance Abuse   Alcohol Negative   Illegal Drugs Negative   Rx Drugs Negative     Age   Age between 92-45 years  Yes     History of Preadolescent Sexual Abuse   History of Preadolescent Sexual Abuse  Negative or Male     Psychological Disease   Psychological Disease Negative   Depression Negative     Total Score   Opioid Risk Tool Scoring 1   Opioid Risk Interpretation Low Risk     ORT Scoring interpretation table:  Score <3 = Low Risk for SUD  Score between 4-7 = Moderate Risk for SUD  Score >8 = High Risk for Opioid Abuse   Risk Mitigation Strategies:  Patient Counseling: Covered Patient-Prescriber Agreement (PPA): Present and active  Notification to other healthcare providers: Done  Pharmacologic Plan: No change in therapy, at this time  Laboratory Chemistry  Inflammation Markers (CRP: Acute Phase) (ESR: Chronic Phase) Lab Results  Component Value Date   CRP 0.8 06/09/2016   ESRSEDRATE 6 06/09/2016                 Renal Function Markers Lab Results  Component Value Date   BUN 13 02/01/2017   CREATININE 0.79 02/01/2017   GFRAA >60 06/09/2016   GFRNONAA >60 06/09/2016                 Hepatic Function Markers Lab Results  Component Value Date   AST 11 02/01/2017   ALT 10 02/01/2017   ALBUMIN 4.2 02/01/2017   ALKPHOS 49 02/01/2017                 Electrolytes Lab Results  Component Value Date   NA 139 02/01/2017   K 4.8 02/01/2017   CL 101 02/01/2017   CALCIUM 8.8 02/01/2017   MG 1.7 02/01/2017                 Neuropathy Markers Lab Results  Component Value Date   VITAMINB12 186 (L) 02/01/2017                 Bone Pathology Markers Lab Results  Component Value Date   ALKPHOS 49 02/01/2017   VD25OH 14.49 (L) 02/01/2017   25OHVITD1 31 06/09/2016   25OHVITD2 6.6 06/09/2016   25OHVITD3 24 06/09/2016   CALCIUM 8.8 02/01/2017                 Coagulation Parameters Lab Results  Component Value Date   PLT 240 08/03/2014                 Cardiovascular Markers Lab Results  Component Value Date   HGB 14.9 08/03/2014   HCT 43.9 08/03/2014                 Note: Lab results reviewed.  Recent Diagnostic Imaging Review  No results  found. Note: Imaging results reviewed.  Meds   Current Outpatient Prescriptions:  .  Cholecalciferol (VITAMIN D3) 2000 units capsule, Take 1 capsule (2,000 Units total) by mouth daily. (Patient taking differently: Take 4,000 Units by mouth daily. ), Disp: 90 capsule, Rfl: 0 .  lisinopril (PRINIVIL,ZESTRIL) 40 MG tablet, Take 1 tablet (40 mg total) by mouth daily., Disp: 90 tablet, Rfl: 3 .  oxyCODONE (OXY IR/ROXICODONE) 5 MG immediate release tablet, Take 1 tablet (5 mg total) by mouth every 6 (six) hours as needed for severe pain (May take one additional tab as needed)., Disp: 130 tablet, Rfl: 0 .  vitamin B-12 (CYANOCOBALAMIN) 1000 MCG tablet, Take 1,000 mcg by mouth daily., Disp: , Rfl:  .  Magnesium Oxide 500 MG CAPS, Take 1 capsule (500 mg total) by mouth daily., Disp: 90 capsule, Rfl: 0 .  [START ON 09/17/2017] oxyCODONE (OXY IR/ROXICODONE) 5 MG immediate release tablet, Take 1 tablet (5 mg total) by mouth every 6 (six) hours as needed for severe pain (May take one additional tab as needed)., Disp: 130 tablet, Rfl: 0 .  [START ON 10/17/2017] oxyCODONE (OXY IR/ROXICODONE) 5 MG immediate release tablet, Take 1 tablet (5 mg total) by mouth every 6 (six) hours as needed for severe pain (May take one additional tab as needed)., Disp: 130 tablet, Rfl: 0  ROS  Constitutional: Denies any fever or chills Gastrointestinal: No reported hemesis, hematochezia, vomiting, or acute GI distress Musculoskeletal: Denies any acute onset joint swelling, redness, loss of ROM, or weakness Neurological: No reported episodes of acute onset apraxia, aphasia, dysarthria, agnosia, amnesia, paralysis, loss of coordination, or loss of consciousness  Allergies  Mr. Miyasato is allergic to levaquin [levofloxacin in d5w].  Laura  Drug: Mr. Conard  reports that he does not use drugs. Alcohol:  reports that he does not drink alcohol. Tobacco:  reports that he quit smoking about 9 years ago. He has never used  smokeless tobacco. Medical:  has a past medical history of Bell's palsy (2015); Chronic pain syndrome; DDD (degenerative disc disease), lumbar; HTN (hypertension); and Morbid obesity ([place]) (08/16/2014). Surgical: Mr. Guettler  has a past surgical history that includes Back surgery (2012) and Cholecystectomy. Family: family history includes COPD in his mother; Hypertension in his other.  Constitutional Exam  General appearance: Well nourished, well developed, and well hydrated. In no apparent acute distress Vitals:   08/17/17 0840  BP: (!) 151/96  Pulse: 80  Resp: 16  Temp: 98.1 F (36.7 C)  SpO2: 100%  Weight: 249 lb (112.9 kg)  Height: _0  (1.88 m)   BMI Assessment: Estimated body mass index is 31.97 kg/m as calculated from the following:   Height as of this encounter: _1  (1.88 m).   Weight as of this encounter: 249 lb (112.9 kg). Psych/Mental status: Alert, oriented x 3 (person, place, & time)       Eyes: PERLA Respiratory: No evidence of acute respiratory distress  Lumbar Spine Area Exam  Skin & Axial Inspection: No masses, redness, or swelling Alignment: Symmetrical Functional ROM: Unrestricted ROM      Stability: No instability detected Muscle Tone/Strength: Functionally intact. No obvious neuro-muscular anomalies detected. Sensory (Neurological): Unimpaired Palpation: Complains of area being tender to palpation       Provocative Tests: Lumbar Hyperextension and rotation test: Positive bilaterally for facet joint pain. Lumbar Lateral bending test: evaluation deferred today       Patrick's Maneuver: evaluation deferred today  Gait & Posture Assessment  Ambulation: Unassisted Gait: Relatively normal for age and body habitus Posture: WNL   Lower Extremity Exam    Side: Right lower extremity  Side: Left lower extremity  Skin & Extremity Inspection: Skin color, temperature, and hair growth are WNL. No peripheral edema or cyanosis. No masses, redness,  swelling, asymmetry, or associated skin lesions. No contractures.  Skin & Extremity Inspection: Skin color, temperature, and hair growth are WNL. No peripheral edema or cyanosis. No masses, redness, swelling, asymmetry, or associated skin lesions. No contractures.  Functional ROM: Unrestricted ROM          Functional ROM: Unrestricted ROM          Muscle Tone/Strength: Functionally intact. No obvious neuro-muscular anomalies detected.  Muscle Tone/Strength: Functionally intact. No obvious neuro-muscular anomalies detected.  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 Diagnoses of Lumbar facet syndrome (Bilateral) (R>L), Lumbar DDD (degenerative disc disease), and Chronic pain syndrome were also pertinent to this visit.  Status Diagnosis  Controlled Controlled Controlled 1. 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 Lumbar facet syndrome (Bilateral) (R>L)   3. Lumbar DDD (degenerative disc disease)   4. Chronic pain syndrome     Problems updated and reviewed during this visit: No problems updated. Plan of Care  Pharmacotherapy (Medications Ordered): Meds ordered this encounter  Medications  . oxyCODONE (OXY IR/ROXICODONE) 5 MG immediate release tablet    Sig: Take 1 tablet (5 mg total) by mouth every 6 (six) hours as needed for severe pain (May take one additional tab as needed).    Dispense:  130 tablet    Refill:  0    Do not place this medication, or any other prescription from our practice, on "Automatic Refill". Patient may have prescription filled one day early if pharmacy is closed on scheduled refill date. Do not fill until: 08/18/2017 To last until: 09/17/2017    Order Specific Question:   Supervising Provider    Answer:   Milinda Pointer [phone]  .  oxyCODONE (OXY IR/ROXICODONE) 5 MG immediate release tablet    Sig: Take 1 tablet (5 mg total) by mouth every 6 (six) hours as needed for severe pain (May take one additional tab as needed).    Dispense:  130 tablet    Refill:  0    Do not place this medication, or any other prescription from our practice, on "Automatic Refill". Patient may have prescription filled one day early if pharmacy is closed on scheduled refill date. Do not fill until: 09/17/2017 To last until:10/17/2017    Order Specific Question:   Supervising Provider    Answer:   Milinda Pointer [phone]  . oxyCODONE (OXY IR/ROXICODONE) 5 MG immediate release tablet    Sig: Take 1 tablet (5 mg total) by mouth every 6 (six) hours as needed for severe pain (May take one additional tab as needed).    Dispense:  130 tablet    Refill:  0    Do not place this medication, or any other prescription from our practice, on "Automatic Refill". Patient may have prescription filled one day early if pharmacy is closed on scheduled refill date. Do not fill until:10/17/2017 To last until: 11/16/2017    Order Specific Question:   Supervising Provider    Answer:   Milinda Pointer [phone]  New Prescriptions   No medications on file   Medications administered today: Mr. Delcarlo had no medications administered during this visit. Lab-work, procedure(s), and/or referral(s): No orders of the defined types were placed in this encounter.  Imaging and/or referral(s): None  Interventional therapies: Planned, scheduled, and/or pending:       Considering:   Diagnostic right-sided cervical epidural steroid injection  Diagnostic thoracic epidural steroid injection  Diagnostic bilateral thoracic facet block  Possible bilateral thoracic facet radiofrequency ablation  Diagnostic bilateral lumbar facet block  Possible bilateral lumbar facet radiofrequency ablation  Diagnostic right sacroiliac joint block  Possible right sacroiliac joint  radiofrequency ablation  Diagnostic caudal epidural steroid injection and epidurogram  Diagnostic right-sided L4-5 transforaminal epidural steroid injection  Diagnostic right intra-articular knee injection  Diagnostic right genicular nerve block  Possible right genicular radiofrequency ablation    Palliative PRN treatment(s):   Palliative right-sided cervical epidural steroid injection  Palliative thoracic epidural steroid injection  Palliative bilateral thoracic facet block  Palliative bilateral lumbar facet block  Palliative right sacroiliac joint block  Palliative caudal epidural steroid injection and epidurogram  Palliative right-sided L4-5 transforaminal epidural steroid injection  Palliativeright intra-articular knee injection  Palliative right genicular nerve block    Provider-requested follow-up: Return in about 3 months (around 11/16/2017) for MedMgmt.  Future Appointments Date Time Provider [place]  11/15/2017 9:00 AM Vevelyn Francois, NP [facility] None   Primary Care Physician: Ria Bush, MD Location: [facility] Note by: Vevelyn Francois NP Date: 08/17/2017; Time: 3:21 PM  Pain Score Disclaimer: We use the NRS-11 scale. This is a self-reported, subjective measurement of pain severity with only modest accuracy. It is used primarily to identify changes within a particular patient. It must be understood that outpatient pain scales are significantly less accurate that those used for research, where they can be applied under ideal controlled circumstances with minimal exposure to variables. In reality, the score is likely to be a combination of pain intensity and pain affect, where pain affect describes the degree of emotional arousal or changes in action readiness caused by the sensory experience of pain. Factors such as social and work situation, setting, emotional state, anxiety levels, expectation, and prior pain experience may  influence pain perception and show large inter-individual differences that may also be affected by time variables.  Patient instructions provided during this appointment: Patient Instructions    ____________________________________________________________________________________________  Medication Rules  Applies to: All patients receiving prescriptions (written or electronic).  Pharmacy of record: Pharmacy where electronic prescriptions will be sent. If written prescriptions are taken to a different pharmacy, please inform the nursing staff. The pharmacy listed in the electronic medical record should be the one where you would like electronic prescriptions to be sent.  Prescription refills: Only during scheduled appointments. Applies to both, written and electronic prescriptions.  NOTE: The following applies primarily to controlled substances (Opioid* Pain Medications).   Patient's responsibilities: 1. Pain Pills: Bring all pain pills to every appointment (except for procedure appointments). 2. Pill Bottles: Bring pills in original pharmacy bottle. Always bring newest bottle. Bring bottle, even if empty. 3. Medication refills: You are responsible for knowing and keeping track of what medications you need refilled. The day before your appointment, write a list of all prescriptions that need to be refilled. Bring that list to your appointment and give it to the admitting nurse. Prescriptions will be written only during appointments. If you forget a medication, it will not be "Called in", "  Faxed", or "electronically sent". You will need to get another appointment to get these prescribed. 4. Prescription Accuracy: You are responsible for carefully inspecting your prescriptions before leaving our office. Have the discharge nurse carefully go over each prescription with you, before taking them home. Make sure that your name is accurately spelled, that your address is correct. Check the name and dose  of your medication to make sure it is accurate. Check the number of pills, and the written instructions to make sure they are clear and accurate. Make sure that you are given enough medication to last until your next medication refill appointment. 5. Taking Medication: Take medication as prescribed. Never take more pills than instructed. Never take medication more frequently than prescribed. Taking less pills or less frequently is permitted and encouraged, when it comes to controlled substances (written prescriptions).  6. Inform other Doctors: Always inform, all of your healthcare providers, of all the medications you take. 7. Pain Medication from other Providers: You are not allowed to accept any additional pain medication from any other Doctor or Healthcare provider. There are two exceptions to this rule. (see below) In the event that you require additional pain medication, you are responsible for notifying us, as stated below. 8. Medication Agreement: You are responsible for carefully reading and following our Medication Agreement. This must be signed before receiving any prescriptions from our practice. Safely store a copy of your signed Agreement. Violations to the Agreement will result in no further prescriptions. (Additional copies of our Medication Agreement are available upon request.) 9. Laws, Rules, & Regulations: All patients are expected to follow all Federal and [employer], Rules, [employer]. Ignorance of the Laws does not constitute a valid excuse. The use of any illegal substances is prohibited. 10. Adopted CDC guidelines & recommendations: Target dosing levels will be at or below 60 MME/day. Use of benzodiazepines** is not recommended.  Exceptions: There are only two exceptions to the rule of not receiving pain medications from other Healthcare Providers. 1. Exception #1 (Emergencies): In the event of an emergency (i.e.: accident requiring emergency care), you are allowed to  receive additional pain medication. However, you are responsible for: As soon as you are able, call our office [phone], at any time of the day or night, and leave a message stating your name, the date and nature of the emergency, and the name and dose of the medication prescribed. In the event that your call is answered by a member of our staff, make sure to document and save the date, time, and the name of the person that took your information.  2. Exception #2 (Planned Surgery): In the event that you are scheduled by another doctor or dentist to have any type of surgery or procedure, you are allowed (for a period no longer than 30 days), to receive additional pain medication, for the acute post-op pain. However, in this case, you are responsible for picking up a copy of our "Post-op Pain Management for Surgeons" handout, and giving it to your surgeon or dentist. This document is available at our office, and does not require an appointment to obtain it. Simply go to our office during business hours (Monday-Thursday from 8:00 AM to 4:00 PM) (Friday 8:00 AM to 12:00 Noon) or if you have a scheduled appointment with [place], prior to your surgery, and ask for it by name. In addition, you will need to provide [place] with your name, name of your surgeon, type of surgery, and date of  procedure or surgery.  *Opioid medications include: morphine, codeine, oxycodone, oxymorphone, hydrocodone, hydromorphone, meperidine, tramadol, tapentadol, buprenorphine, fentanyl, methadone. **Benzodiazepine medications include: diazepam (Valium), alprazolam (Xanax), clonazepam (Klonopine), lorazepam (Ativan), clorazepate (Tranxene), chlordiazepoxide (Librium), estazolam (Prosom), oxazepam (Serax), temazepam (Restoril), triazolam (Halcion)  ____________________________________________________________________________________________  BMI Assessment: Estimated body mass index is 31.97 kg/m as calculated from the following:   Height  as of this encounter: _0  (1.88 m).   Weight as of this encounter: 249 lb (112.9 kg).  BMI interpretation table: BMI level Category Range association with higher incidence of chronic pain  <18 kg/m2 Underweight   18.5-24.9 kg/m2 Ideal body weight   25-29.9 kg/m2 Overweight Increased incidence by 20%  30-34.9 kg/m2 Obese (Class I) Increased incidence by 68%  35-39.9 kg/m2 Severe obesity (Class II) Increased incidence by 136%  >40 kg/m2 Extreme obesity (Class III) Increased incidence by 254%   BMI Readings from Last 4 Encounters:  08/17/17 31.97 kg/m  02/18/17 35.82 kg/m  02/04/17 35.85 kg/m  11/20/16 33.25 kg/m   Wt Readings from Last 4 Encounters:  08/17/17 249 lb (112.9 kg)  02/18/17 279 lb (126.6 kg)  02/04/17 279 lb 4 oz (126.7 kg)  11/20/16 266 lb (120.7 kg)

## 2017-08-23 ENCOUNTER — Encounter: Payer: Self-pay | Admitting: Family Medicine

## 2017-08-23 NOTE — Telephone Encounter (Signed)
Sent letter to reschedule appts

## 2017-11-09 ENCOUNTER — Other Ambulatory Visit: Payer: Self-pay

## 2017-11-09 ENCOUNTER — Emergency Department
Admission: EM | Admit: 2017-11-09 | Discharge: 2017-11-09 | Disposition: A | Discharge status: Home / Self Care | Payer: Medicare Other | Attending: Emergency Medicine | Admitting: Emergency Medicine

## 2017-11-09 DIAGNOSIS — G8929 Other chronic pain: Secondary | ICD-10-CM | POA: Insufficient documentation

## 2017-11-09 DIAGNOSIS — R590 Localized enlarged lymph nodes: Secondary | ICD-10-CM | POA: Insufficient documentation

## 2017-11-09 DIAGNOSIS — E119 Type 2 diabetes mellitus without complications: Secondary | ICD-10-CM | POA: Diagnosis not present

## 2017-11-09 DIAGNOSIS — Z79899 Other long term (current) drug therapy: Secondary | ICD-10-CM | POA: Insufficient documentation

## 2017-11-09 DIAGNOSIS — Z87891 Personal history of nicotine dependence: Secondary | ICD-10-CM | POA: Insufficient documentation

## 2017-11-09 DIAGNOSIS — R1031 Right lower quadrant pain: Secondary | ICD-10-CM | POA: Diagnosis present

## 2017-11-09 DIAGNOSIS — I10 Essential (primary) hypertension: Secondary | ICD-10-CM | POA: Insufficient documentation

## 2017-11-09 LAB — URINALYSIS, COMPLETE (UACMP) WITH MICROSCOPIC
BILIRUBIN URINE: NEGATIVE
Bacteria, UA: NONE SEEN
GLUCOSE, UA: NEGATIVE mg/dL
HGB URINE DIPSTICK: NEGATIVE
KETONES UR: NEGATIVE mg/dL
LEUKOCYTES UA: NEGATIVE
NITRITE: NEGATIVE
PROTEIN: NEGATIVE mg/dL
RBC / HPF: NONE SEEN RBC/hpf (ref 0–5)
Specific Gravity, Urine: 1.003 — ABNORMAL LOW (ref 1.005–1.030)
Squamous Epithelial / LPF: NONE SEEN
pH: 7 (ref 5.0–8.0)

## 2017-11-09 LAB — CHLAMYDIA/NGC RT PCR (ARMC ONLY)
Chlamydia Tr: NOT DETECTED
N gonorrhoeae: NOT DETECTED

## 2017-11-09 NOTE — ED Triage Notes (Signed)
When looking, area has color WNL, no obvious signs of skin infection. Palpable hardened area, tender to touch.

## 2017-11-09 NOTE — ED Notes (Signed)
Hardened area to right side of groin, one area approx quarter size. No redness. Tender and pain to palpation.

## 2017-11-09 NOTE — ED Triage Notes (Signed)
Pt awoke yesterday and describes 2 "knots" to right inner groin. Pt alert and oriented X4, active, cooperative, pt in NAD. RR even and unlabored, color WNL.
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
## 2017-11-10 ENCOUNTER — Other Ambulatory Visit: Payer: Self-pay

## 2017-11-10 ENCOUNTER — Ambulatory Visit: Payer: Self-pay | Admitting: *Deleted

## 2017-11-10 ENCOUNTER — Encounter (HOSPITAL_COMMUNITY): Payer: Self-pay | Admitting: Emergency Medicine

## 2017-11-10 ENCOUNTER — Ambulatory Visit (HOSPITAL_COMMUNITY)
Admission: EM | Admit: 2017-11-10 | Discharge: 2017-11-10 | Disposition: A | Discharge status: Home / Self Care | Payer: Medicare Other | Attending: Family Medicine | Admitting: Family Medicine

## 2017-11-10 DIAGNOSIS — J020 Streptococcal pharyngitis: Secondary | ICD-10-CM | POA: Diagnosis not present

## 2017-11-10 LAB — URINE CULTURE: CULTURE: NO GROWTH

## 2017-11-10 LAB — POCT RAPID STREP A: Streptococcus, Group A Screen (Direct): POSITIVE — AB

## 2017-11-10 MED ORDER — PENICILLIN G BENZATHINE 1200000 UNIT/2ML IM SUSP
INTRAMUSCULAR | Status: AC
  Filled 2017-11-10: qty 2

## 2017-11-10 MED ORDER — ACETAMINOPHEN 325 MG PO TABS
650.0000 mg | ORAL_TABLET | Freq: Once | ORAL | Status: AC
  Administered 2017-11-10: 650 mg via ORAL

## 2017-11-10 MED ORDER — PENICILLIN G BENZATHINE 1200000 UNIT/2ML IM SUSP
1.2000 10*6.[IU] | Freq: Once | INTRAMUSCULAR | Status: AC
  Administered 2017-11-10: 1.2 10*6.[IU] via INTRAMUSCULAR

## 2017-11-10 MED ORDER — PENICILLIN G BENZATHINE 1200000 UNIT/2ML IM SUSP: 1.2000 10*6.[IU] | Freq: Once | INTRAMUSCULAR | Status: DC

## 2017-11-10 MED ORDER — ACETAMINOPHEN 325 MG PO TABS
ORAL_TABLET | ORAL | Status: AC
  Filled 2017-11-10: qty 2

## 2017-11-10 MED ORDER — AMOXICILLIN 500 MG PO CAPS: 500.0000 mg | ORAL_CAPSULE | Freq: Two times a day (BID) | ORAL | 0 refills | Status: DC

## 2017-11-10 NOTE — ED Provider Notes (Signed)
[facility]    CSN: [id number] Arrival date & time: 11/10/17  1818     History   Chief Complaint Chief Complaint  Patient presents with  . URI    HPI Larry Goodwin is a 41 y.o. male.   Larry Goodwin presents with his wife with complaints of sore throat and neck swelling which started yesterday and worsened today. Woke at 0300 this am with chills and has had intermittently since. Took ibuprofen last at 1430 this afternoon. Headache. History of sinus infections but this is worse pain. Mild nausea, without vomiting, diarrhea, abdominal pain or rash. Left ear pain. Without cough. No known ill contacts. Eating and drinking but has decreased. Normal urination. Takes oxycodone at baseline. Was seen yesterday at [place] er and diagnosed with inguinal lymphadenopathy, patient without fever at that time. Symptoms have since worsened.    ROS per HPI.       Past Medical History:  Diagnosis Date  . Bell's palsy 2015   right then left sides  . Chronic pain syndrome    s/p 2 back surgeries [facility])  . DDD (degenerative disc disease), lumbar    severe s/p 2 surgeries  . HTN (hypertension)   . Morbid obesity ([place]) 08/16/2014    Patient Active Problem List   Diagnosis Date Noted  . Vitamin B12 deficiency 02/04/2017  . Chronic hip pain (Location of Secondary source of pain) (Right) 11/20/2016  . Chronic shoulder pain (Location of Tertiary source of pain) (Bilateral) (Right) 11/20/2016  . Cervical IVDD (intervertebral disc displacement) 06/10/2016  . Cervical foraminal stenosis (C5-6 and C7-T1: Right) 06/10/2016  . Cervical central spinal stenosis (C5-6) 06/10/2016  . Thoracic IVDD (intervertebral disc displacement) 06/10/2016  . Thoracic facet arthropathy 06/10/2016  . Thoracic facet syndrome ([place]) 06/10/2016  . Lumbar IVDD (intervertebral disc displacement) 06/10/2016  . Lumbar foraminal stenosis (Right L4-5) 06/10/2016  . Lumbar central spinal stenosis (L3-4 and L4-5)  06/10/2016  . Chronic knee pain (Right) 06/10/2016  . Osteoarthritis of knee (Right) 06/10/2016  . Disturbance of skin sensation 06/09/2016  . History of tobacco abuse 10/01/2015  . Low magnesium levels 10/01/2015  . Vitamin D deficiency 10/01/2015  . Myofascial pain syndrome 10/01/2015  . Lumbar facet syndrome (Bilateral) (R>L) 10/01/2015  . Chronic upper back pain (Location of Tertiary source of pain) 10/01/2015  . Generalized anxiety disorder 10/01/2015  . Depression 10/01/2015  . Lumbar spondylosis with radiculopathy 10/01/2015  . Epidural fibrosis 10/01/2015  . 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 Encounter for therapeutic drug level monitoring 10/01/2015  . Long term prescription opiate use 10/01/2015  . Long term current use of opiate analgesic 10/01/2015  . Opiate use (30 MME/Day) 10/01/2015  . Chronic low back pain (Location of Primary Source of Pain) (Bilateral) (R>L) 10/01/2015  . Chronic sacroiliac joint pain (Right) 10/01/2015  . Abnormal MRI, lumbar spine 10/01/2015  .  Abnormal CT myelogram of the thoracic spine 10/01/2015  . Thoracic disc herniation 10/01/2015  . Bulge of lumbar disc without myelopathy 10/01/2015  . Vertebral body hemangioma 10/01/2015  . Medicare annual wellness visit, initial 11/19/2014  . Type 2 diabetes, diet controlled ([place]) 11/19/2014  . Chronic pain syndrome   . Acute sinusitis 08/16/2014  . Severe obesity (BMI 35.0-35.9 with comorbidity) ([place]) 08/16/2014  . Lumbar DDD (degenerative disc disease)   . HTN (hypertension)   . Bell's palsy     Past Surgical History:  Procedure Laterality Date  . BACK SURGERY  2012   x 2 (  [facility])  . CHOLECYSTECTOMY         Home Medications    Prior to Admission medications   Medication Sig Start Date End Date Taking? Authorizing Provider  Cholecalciferol (VITAMIN D3) 2000 units capsule Take 1 capsule (2,000 Units total) by mouth daily. Patient taking differently: Take 4,000 Units by mouth  daily.  09/22/16 02/04/21  Milinda Pointer, MD  lisinopril (PRINIVIL,ZESTRIL) 40 MG tablet Take 1 tablet (40 mg total) by mouth daily. 02/09/17   Ria Bush, MD  Magnesium Oxide 500 MG CAPS Take 1 capsule (500 mg total) by mouth daily. 02/19/17 05/20/17  Milinda Pointer, MD  oxyCODONE (OXY IR/ROXICODONE) 5 MG immediate release tablet Take 1 tablet (5 mg total) by mouth every 6 (six) hours as needed for severe pain (May take one additional tab as needed). 08/17/17 09/16/17  Vevelyn Francois, NP  oxyCODONE (OXY IR/ROXICODONE) 5 MG immediate release tablet Take 1 tablet (5 mg total) by mouth every 6 (six) hours as needed for severe pain (May take one additional tab as needed). 09/17/17 10/17/17  Vevelyn Francois, NP  oxyCODONE (OXY IR/ROXICODONE) 5 MG immediate release tablet Take 1 tablet (5 mg total) by mouth every 6 (six) hours as needed for severe pain (May take one additional tab as needed). 10/17/17 11/16/17  Vevelyn Francois, NP  vitamin B-12 (CYANOCOBALAMIN) 1000 MCG tablet Take 1,000 mcg by mouth daily.    [provider]    Family History Family History  Problem Relation Age of Onset  . COPD Mother        smoker (deceased at 72)  . Hypertension Other   . Stroke Neg Hx   . Cancer Neg Hx   . Diabetes Neg Hx   . CAD Neg Hx     Social History Social History   Tobacco Use  . Smoking status: Former Smoker    Last attempt to quit: 12/15/2007    Years since quitting: 9.9  . Smokeless tobacco: Never Used  . Tobacco comment: quit 6 years ago  Substance Use Topics  . Alcohol use: No    Alcohol/week: 0.0 oz  . Drug use: No     Allergies   Levaquin [levofloxacin in d5w]   Review of Systems Review of Systems   Physical Exam Triage Vital Signs ED Triage Vitals  Enc Vitals Group     BP 11/10/17 1841 (!) 141/100     Pulse Rate 11/10/17 1841 (!) 136     Resp 11/10/17 1841 (!) 22     Temp 11/10/17 1841 (!) 101.5 F (38.6 C)     Temp Source 11/10/17 1841 Oral     SpO2  11/10/17 1841 97 %     Weight --      Height --      Head Circumference --      Peak Flow --      Pain Score 11/10/17 1838 3     Pain Loc --      Pain Edu? --      Excl. in [place]? --    No data found.  Updated Vital Signs BP (!) 134/95 (BP Location: Left Arm)   Pulse (!) 109   Temp 99.1 F (37.3 C) (Oral)   Resp 18   SpO2 100%   Visual Acuity Right Eye Distance:   Left Eye Distance:   Bilateral Distance:    Right Eye Near:   Left Eye Near:    Bilateral Near:     Physical Exam  Constitutional: He is oriented to person, place, and time. He appears well-developed and well-nourished.  HENT:  Head: Normocephalic and atraumatic.  Right Ear: Tympanic membrane, external ear and ear canal normal.  Left Ear: Tympanic membrane, external ear and ear canal normal.  Nose: Nose normal. Right sinus exhibits no maxillary sinus tenderness and no frontal sinus tenderness. Left sinus exhibits no maxillary sinus tenderness and no frontal sinus tenderness.  Mouth/Throat: Uvula is midline and mucous membranes are normal. Posterior oropharyngeal edema and posterior oropharyngeal erythema present. Tonsils are 2+ on the right. Tonsils are 2+ on the left. Tonsillar exudate.  Eyes: Conjunctivae are normal. Pupils are equal, round, and reactive to light.  Neck: Normal range of motion.  Cardiovascular: Regular rhythm. Tachycardia present.  Pulmonary/Chest: Effort normal and breath sounds normal.  Abdominal: Soft. There is no tenderness.  Lymphadenopathy:    He has cervical adenopathy.  Neurological: He is alert and oriented to person, place, and time.  Skin: Skin is warm and dry.  Vitals reviewed.    UC Treatments / Results  Labs (all labs ordered are listed, but only abnormal results are displayed) Labs Reviewed  POCT RAPID STREP A - Abnormal; Notable for the following components:      Result Value   Streptococcus, Group A Screen (Direct) POSITIVE (*)    All other components within normal  limits    EKG  EKG Interpretation None       Radiology No results found.  Procedures Procedures (including critical care time)  Medications Ordered in UC Medications  acetaminophen (TYLENOL) tablet 650 mg (650 mg Oral Given 11/10/17 1850)  penicillin g benzathine (BICILLIN LA) 1200000 UNIT/2ML injection 1.2 Million Units (1.2 Million Units Intramuscular Given 11/10/17 1931)     Initial Impression / Assessment and Plan / UC Course  I have reviewed the triage vital signs and the nursing notes.  Pertinent labs & imaging results that were available during my care of the patient were reviewed by me and considered in my medical decision making (see chart for details).     Bicillin provided in clinic today prior to departure. Improvement in hr and temp s/p tylenol in clinic today. Tylenol and/or ibuprofen as needed for pain or fevers. Rest.Push fluids to ensure adequate hydration and keep secretions thin. Return precautions provided. Patient verbalized understanding and agreeable to plan.    Final Clinical Impressions(s) / UC Diagnoses   Final diagnoses:  Strep pharyngitis      Controlled Substance Prescriptions [place] Controlled Substance Registry consulted? Not Applicable   Zigmund Gottron, NP 11/10/17 1952    Zigmund Gottron, NP 11/10/17 [phone]

## 2017-11-10 NOTE — ED Triage Notes (Signed)
Started feeling bad yesterday.  Went to [facility] last night also complaining of swollen lymph nodes in right groin.  Has had fever- 100.7, shakes fever, sore throat chills

## 2017-11-10 NOTE — Telephone Encounter (Signed)
He has decided to go to an urgent care after being triaged.   Main complaint is a sore throat. He went to the ED at [facility] last night for a "lump in my right groin"   The urine test was clear.   I was told it is  "a swollen lymph node not a hernia"     Reason for Disposition . [1] Sore throat is the only symptom AND [2] present > 48 hours  Answer Assessment - Initial Assessment Questions 1. ONSET: "When did the throat start hurting?" (Hours or days ago)      Yesterday morning.  It got worse as the day went on.  My neck is hurting. 2. SEVERITY: "How bad is the sore throat?" (Scale 1-10; mild, moderate or severe)   - MILD (1-3):  doesn't interfere with eating or normal activities   - MODERATE (4-7): interferes with eating some solids and normal activities   - SEVERE (8-10):  excruciating pain, interferes with most normal activities   - SEVERE DYSPHAGIA: can't swallow liquids, drooling     I haven't eaten or drink anything this morning.   When I swallow it hurts really bad. 3. STREP EXPOSURE: "Has there been any exposure to strep within the past week?" If so, ask: "What type of contact occurred?"      Not that I know of 4.  VIRAL SYMPTOMS: "Are there any symptoms of a cold, such as a runny nose, cough, hoarse voice or red eyes?"      Stopped up nose, no cough, sore throat 5. FEVER: "Do you have a fever?" If so, ask: "What is your temperature, how was it measured, and when did it start?"     I had chills on and off last night and this morning.  Body aches 6. PUS ON THE TONSILS: "Is there pus on the tonsils in the back of your throat?"     I haven't looked 7. OTHER SYMPTOMS: "Do you have any other symptoms?" (e.g., difficulty breathing, headache, rash)     Headache 8. PREGNANCY: "Is there any chance you are pregnant?" "When was your last menstrual period?"     N/A  Protocols used: SORE THROAT-A-AH

## 2017-11-10 NOTE — Discharge Instructions (Addendum)
Change out your toothbrush in 24 hours.  You do not need to take additional antibiotics as the shot you have received will be adequate treatment. Tylenol and/or ibuprofen as needed for pain or fevers.  Push fluids to ensure adequate hydration and keep secretions thin.

## 2017-11-10 NOTE — ED Notes (Signed)
Patient verbalized understanding of discharge instructions and denies any further needs or questions at this time. VS stable. Patient ambulatory with steady gait.  

## 2017-11-15 ENCOUNTER — Other Ambulatory Visit: Payer: Self-pay | Admitting: Nurse Practitioner

## 2017-11-15 ENCOUNTER — Other Ambulatory Visit: Payer: Self-pay

## 2017-11-15 ENCOUNTER — Ambulatory Visit: Payer: Medicare Other | Attending: Nurse Practitioner | Admitting: Nurse Practitioner

## 2017-11-15 ENCOUNTER — Encounter: Payer: Self-pay | Admitting: Nurse Practitioner

## 2017-11-15 VITALS — BP 156/90 | HR 68 | Temp 98.1°F | Resp 16 | Ht 74.0 in | Wt 250.0 lb

## 2017-11-15 DIAGNOSIS — M4802 Spinal stenosis, cervical region: Secondary | ICD-10-CM | POA: Diagnosis not present

## 2017-11-15 DIAGNOSIS — M5124 Other intervertebral disc displacement, thoracic region: Secondary | ICD-10-CM | POA: Insufficient documentation

## 2017-11-15 DIAGNOSIS — M48061 Spinal stenosis, lumbar region without neurogenic claudication: Secondary | ICD-10-CM | POA: Diagnosis not present

## 2017-11-15 DIAGNOSIS — M545 Low back pain, unspecified: Secondary | ICD-10-CM

## 2017-11-15 DIAGNOSIS — M5116 Intervertebral disc disorders with radiculopathy, lumbar region: Secondary | ICD-10-CM | POA: Diagnosis not present

## 2017-11-15 DIAGNOSIS — D1809 Hemangioma of other sites: Secondary | ICD-10-CM | POA: Insufficient documentation

## 2017-11-15 DIAGNOSIS — Z9049 Acquired absence of other specified parts of digestive tract: Secondary | ICD-10-CM | POA: Insufficient documentation

## 2017-11-15 DIAGNOSIS — Z87891 Personal history of nicotine dependence: Secondary | ICD-10-CM | POA: Insufficient documentation

## 2017-11-15 DIAGNOSIS — I10 Essential (primary) hypertension: Secondary | ICD-10-CM | POA: Insufficient documentation

## 2017-11-15 DIAGNOSIS — Z6832 Body mass index (BMI) 32.0-32.9, adult: Secondary | ICD-10-CM | POA: Insufficient documentation

## 2017-11-15 DIAGNOSIS — Z5181 Encounter for therapeutic drug level monitoring: Secondary | ICD-10-CM | POA: Diagnosis not present

## 2017-11-15 DIAGNOSIS — Z8249 Family history of ischemic heart disease and other diseases of the circulatory system: Secondary | ICD-10-CM | POA: Insufficient documentation

## 2017-11-15 DIAGNOSIS — Z79899 Other long term (current) drug therapy: Secondary | ICD-10-CM | POA: Insufficient documentation

## 2017-11-15 DIAGNOSIS — M961 Postlaminectomy syndrome, not elsewhere classified: Secondary | ICD-10-CM | POA: Insufficient documentation

## 2017-11-15 DIAGNOSIS — M549 Dorsalgia, unspecified: Secondary | ICD-10-CM

## 2017-11-15 DIAGNOSIS — Z825 Family history of asthma and other chronic lower respiratory diseases: Secondary | ICD-10-CM | POA: Insufficient documentation

## 2017-11-15 DIAGNOSIS — E538 Deficiency of other specified B group vitamins: Secondary | ICD-10-CM | POA: Insufficient documentation

## 2017-11-15 DIAGNOSIS — G8929 Other chronic pain: Secondary | ICD-10-CM

## 2017-11-15 DIAGNOSIS — Z881 Allergy status to other antibiotic agents status: Secondary | ICD-10-CM | POA: Insufficient documentation

## 2017-11-15 DIAGNOSIS — F411 Generalized anxiety disorder: Secondary | ICD-10-CM | POA: Diagnosis not present

## 2017-11-15 DIAGNOSIS — Z79891 Long term (current) use of opiate analgesic: Secondary | ICD-10-CM | POA: Diagnosis not present

## 2017-11-15 DIAGNOSIS — E559 Vitamin D deficiency, unspecified: Secondary | ICD-10-CM | POA: Diagnosis not present

## 2017-11-15 DIAGNOSIS — M533 Sacrococcygeal disorders, not elsewhere classified: Secondary | ICD-10-CM | POA: Insufficient documentation

## 2017-11-15 DIAGNOSIS — E119 Type 2 diabetes mellitus without complications: Secondary | ICD-10-CM | POA: Insufficient documentation

## 2017-11-15 DIAGNOSIS — M25561 Pain in right knee: Secondary | ICD-10-CM | POA: Diagnosis not present

## 2017-11-15 DIAGNOSIS — M25511 Pain in right shoulder: Secondary | ICD-10-CM | POA: Insufficient documentation

## 2017-11-15 DIAGNOSIS — M7918 Myalgia, other site: Secondary | ICD-10-CM | POA: Diagnosis not present

## 2017-11-15 DIAGNOSIS — F329 Major depressive disorder, single episode, unspecified: Secondary | ICD-10-CM | POA: Insufficient documentation

## 2017-11-15 DIAGNOSIS — M1711 Unilateral primary osteoarthritis, right knee: Secondary | ICD-10-CM | POA: Insufficient documentation

## 2017-11-15 DIAGNOSIS — G894 Chronic pain syndrome: Secondary | ICD-10-CM | POA: Diagnosis not present

## 2017-11-15 DIAGNOSIS — M488X6 Other specified spondylopathies, lumbar region: Secondary | ICD-10-CM | POA: Diagnosis not present

## 2017-11-15 DIAGNOSIS — M4726 Other spondylosis with radiculopathy, lumbar region: Secondary | ICD-10-CM | POA: Insufficient documentation

## 2017-11-15 DIAGNOSIS — M25551 Pain in right hip: Secondary | ICD-10-CM | POA: Diagnosis not present

## 2017-11-15 DIAGNOSIS — M25512 Pain in left shoulder: Secondary | ICD-10-CM | POA: Insufficient documentation

## 2017-11-15 MED ORDER — OXYCODONE HCL 5 MG PO TABS: 5.0000 mg | ORAL_TABLET | Freq: Four times a day (QID) | ORAL | 0 refills | Status: DC | PRN

## 2017-11-15 NOTE — Progress Notes (Signed)
Nursing Pain Medication Assessment:  Safety precautions to be maintained throughout the outpatient stay will include: orient to surroundings, keep bed in low position, maintain call bell within reach at all times, provide assistance with transfer out of bed and ambulation.  Medication Inspection Compliance: Pill count conducted under aseptic conditions, in front of the patient. Neither the pills nor the bottle was removed from the patient's sight at any time. Once count was completed pills were immediately returned to the patient in their original bottle.  Medication: Oxycodone IR Pill/Patch Count: 3 of 130 pills remain Pill/Patch Appearance: Markings consistent with prescribed medication Bottle Appearance: Standard pharmacy container. Clearly labeled. Filled Date: 11/04/ 2018 Last Medication intake:  Today

## 2017-11-15 NOTE — Progress Notes (Signed)
Patient's Name: Larry Goodwin  MRN: [id number]  Referring Provider: Ria Bush, MD  DOB: 03/25/1976  PCP: Ria Bush, MD  DOS: 11/15/2017  Note by: Vevelyn Francois NP  Service setting: Ambulatory outpatient  Specialty: Interventional Pain Management  Location: [facility] (AMB) Pain Management Facility    Patient type: Established    Primary Reason(s) for Visit: Encounter for prescription drug management. (Level of risk: moderate)  CC: Back Pain (upper, middle, lower)  HPI  Larry Goodwin is a 41 y.o. year old, male patient, who comes today for a medication management evaluation. He has Acute sinusitis; Lumbar DDD (degenerative disc disease); HTN (hypertension); Bell's palsy; Severe obesity (BMI 35.0-35.9 with comorbidity) ([place]); Chronic pain syndrome; Medicare annual wellness visit, initial; Type 2 diabetes, diet controlled (Larry Goodwin); History of tobacco abuse; Low magnesium levels; Vitamin D deficiency; Myofascial pain syndrome; Lumbar facet syndrome (Bilateral) (R>L); Chronic upper back pain (Location of Tertiary source of pain); Generalized anxiety disorder; Depression; Lumbar spondylosis with radiculopathy; Epidural fibrosis; Failed back surgical syndrome; Encounter for therapeutic drug level monitoring; Long term prescription opiate use; Long term current use of opiate analgesic; Opiate use (30 MME/Day); Chronic low back pain (Location of Primary Source of Pain) (Bilateral) (R>L); Chronic sacroiliac joint pain (Right); Abnormal MRI, lumbar spine;  Abnormal CT myelogram of the thoracic spine; Thoracic disc herniation; Bulge of lumbar disc without myelopathy; Vertebral body hemangioma; Disturbance of skin sensation; Cervical IVDD (intervertebral disc displacement); 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 His primarily concern today is the Back Pain (upper, middle, lower)  Pain Assessment: Location: Upper, Mid, Lower Back Radiating: right buttocks Onset: More than a month ago Duration: Chronic pain Quality: Constant Severity: 3 /10 (self-reported pain score)  Note: Reported level is compatible with observation.                          Effect on ADL:   Timing: Constant Modifying factors: lying down, changing positions  Larry Goodwin was last scheduled for an appointment on 08/17/2017 for medication management. During today's appointment we reviewed Larry Goodwin's chronic pain status, as well as his outpatient medication regimen. He denies any numbness, tingling. He does have occasional weakness in his right leg. He denies any concerns or side effects today.   The patient  reports that he does not use drugs. His body mass index is 32.1 kg/m.  Further details on both, my assessment(s), as well as the proposed treatment plan, please see below.  Controlled Substance Pharmacotherapy Assessment REMS (Risk Evaluation and Mitigation Strategy)  Analgesic:Oxycodone IR 5 mg every 6 hours (4 per day) (20 mg/day) MME/day:30 mg/day.    [name], RN  11/15/2017  9:27 AM  Sign at close encounter Nursing Pain Medication Assessment:  Safety precautions to be maintained throughout the outpatient stay will include: orient to surroundings, keep bed in low position, maintain call bell within reach at all times, provide assistance with transfer out of bed and ambulation.  Medication Inspection Compliance: Pill count conducted under aseptic conditions, in  front of the patient.  Neither the pills nor the bottle was removed from the patient's sight at any time. Once count was completed pills were immediately returned to the patient in their original bottle.  Medication: Oxycodone IR Pill/Patch Count: 3 of 130 pills remain Pill/Patch Appearance: Markings consistent with prescribed medication Bottle Appearance: Standard pharmacy container. Clearly labeled. Filled Date: 11/04/ 2018 Last Medication intake:  Today   Pharmacokinetics: Liberation and absorption (onset of action): WNL Distribution (time to peak effect): WNL Metabolism and excretion (duration of action): WNL         Pharmacodynamics: Desired effects: Analgesia: Larry Goodwin reports >50% benefit. Functional ability: Patient reports that medication allows him to accomplish basic ADLs Clinically meaningful improvement in function (CMIF): Sustained CMIF goals met Perceived effectiveness: Described as relatively effective, allowing for increase in activities of daily living (ADL) Undesirable effects: Side-effects or Adverse reactions: None reported Monitoring: [place] PMP: Online review of the past 60-monthperiod conducted. Compliant with practice rules and regulations Last UDS on record: No results found for: SUMMARY UDS interpretation: Compliant          Medication Assessment Form: Reviewed. Patient indicates being compliant with therapy Treatment compliance: Compliant Risk Assessment Profile: Aberrant behavior: See prior evaluations. None observed or detected today Comorbid factors increasing risk of overdose: See prior notes. No additional risks detected today Risk of substance use disorder (SUD): Low Opioid Risk Tool - 11/15/17 0926      Family History of Substance Abuse   Alcohol  Negative    Illegal Drugs  Negative    Rx Drugs  Negative      Personal History of Substance Abuse   Alcohol  Negative    Illegal Drugs  Negative    Rx Drugs  Negative      Age   Age between  42-45years   Yes      History of Preadolescent Sexual Abuse   History of Preadolescent Sexual Abuse  Negative or Male      Psychological Disease   Psychological Disease  Negative    Depression  Negative      Total Score   Opioid Risk Tool Scoring  1    Opioid Risk Interpretation  Low Risk      ORT Scoring interpretation table:  Score <3 = Low Risk for SUD  Score between 4-7 = Moderate Risk for SUD  Score >8 = High Risk for Opioid Abuse   Risk Mitigation Strategies:  Patient Counseling: Covered Patient-Prescriber Agreement (PPA): Present and active  Notification to other healthcare providers: Done  Pharmacologic Plan: No change in therapy, at this time  Laboratory Chemistry  Inflammation Markers (CRP: Acute Phase) (ESR: Chronic Phase) Lab Results  Component Value Date   CRP 0.8 06/09/2016   ESRSEDRATE 6 06/09/2016                 Rheumatology Markers No results found for: [facility]             Renal Function Markers Lab Results  Component Value Date   BUN 13 02/01/2017   CREATININE 0.79 02/01/2017   GFRAA >60 06/09/2016   GFRNONAA >60 06/09/2016                 Hepatic Function Markers Lab Results  Component Value Date   AST 11 02/01/2017   ALT 10 02/01/2017   ALBUMIN 4.2 02/01/2017   ALKPHOS 49 02/01/2017   LIPASE 153 09/21/2012  Electrolytes Lab Results  Component Value Date   NA 139 02/01/2017   K 4.8 02/01/2017   CL 101 02/01/2017   CALCIUM 8.8 02/01/2017   MG 1.7 02/01/2017                 Neuropathy Markers Lab Results  Component Value Date   VITAMINB12 186 (L) 02/01/2017   HGBA1C 6.5 02/01/2017                 Bone Pathology Markers Lab Results  Component Value Date   VD25OH 14.49 (L) 02/01/2017   25OHVITD1 31 06/09/2016   25OHVITD2 6.6 06/09/2016   25OHVITD3 24 06/09/2016                 Coagulation Parameters Lab Results  Component Value Date   PLT 240 08/03/2014                  Cardiovascular Markers Lab Results  Component Value Date   CKTOTAL 46 07/10/2011   CKMB 1.4 07/10/2011   TROPONINI < 0.02 12/23/2011   HGB 14.9 08/03/2014   HCT 43.9 08/03/2014                 CA Markers No results found for: CEA, CA125, LABCA2               Note: Lab results reviewed.  Recent Diagnostic Imaging Results   Complexity Note: Imaging results reviewed. Results shared with Mr. Swicegood, using Layman's terms.                         Meds   Current Outpatient Medications:  .  Cholecalciferol (VITAMIN D3) 2000 units capsule, Take 1 capsule (2,000 Units total) by mouth daily. (Patient taking differently: Take 4,000 Units by mouth daily. ), Disp: 90 capsule, Rfl: 0 .  lisinopril (PRINIVIL,ZESTRIL) 40 MG tablet, Take 1 tablet (40 mg total) by mouth daily., Disp: 90 tablet, Rfl: 3 .  [START ON 01/15/2018] oxyCODONE (OXY IR/ROXICODONE) 5 MG immediate release tablet, Take 1 tablet (5 mg total) by mouth every 6 (six) hours as needed for severe pain (May take one additional tab as needed)., Disp: 130 tablet, Rfl: 0 .  vitamin B-12 (CYANOCOBALAMIN) 1000 MCG tablet, Take 1,000 mcg by mouth daily., Disp: , Rfl:  .  Magnesium Oxide 500 MG CAPS, Take 1 capsule (500 mg total) by mouth daily., Disp: 90 capsule, Rfl: 0 .  [START ON 12/16/2017] oxyCODONE (OXY IR/ROXICODONE) 5 MG immediate release tablet, Take 1 tablet (5 mg total) by mouth every 6 (six) hours as needed for severe pain (May take one additional tab as needed)., Disp: 130 tablet, Rfl: 0 .  [START ON 11/16/2017] oxyCODONE (OXY IR/ROXICODONE) 5 MG immediate release tablet, Take 1 tablet (5 mg total) by mouth every 6 (six) hours as needed for severe pain (May take one additional tab as needed)., Disp: 130 tablet, Rfl: 0  ROS  Constitutional: Denies any fever or chills Gastrointestinal: No reported hemesis, hematochezia, vomiting, or acute GI distress Musculoskeletal: Denies any acute onset joint swelling, redness, loss  of ROM, or weakness Neurological: No reported episodes of acute onset apraxia, aphasia, dysarthria, agnosia, amnesia, paralysis, loss of coordination, or loss of consciousness  Allergies  Mr. Oliff is allergic to levaquin [levofloxacin in d5w].  [place]  Drug: Mr. Mcleish  reports that he does not use drugs. Alcohol:  reports that he does not drink alcohol. Tobacco:  reports that he quit smoking  about 9 years ago. he has never used smokeless tobacco. Medical:  has a past medical history of Bell's palsy (2015), Chronic pain syndrome, DDD (degenerative disc disease), lumbar, HTN (hypertension), and Morbid obesity ([place]) (08/16/2014). Surgical: Mr. Hermiz  has a past surgical history that includes Back surgery (2012) and Cholecystectomy. Family: family history includes COPD in his mother; Hypertension in his other.  Constitutional Exam  General appearance: Well nourished, well developed, and well hydrated. In no apparent acute distress Vitals:   11/15/17 0921  BP: (!) 156/90  Pulse: 68  Resp: 16  Temp: 98.1 F (36.7 C)  TempSrc: Oral  SpO2: 100%  Weight: 250 lb (113.4 kg)  Height: '6\' 2"'  (1.88 m)  Psych/Mental status: Alert, oriented x 3 (person, place, & time)       Eyes: PERLA Respiratory: No evidence of acute respiratory distress  Thoracic Spine Area Exam  Skin & Axial Inspection: No masses, redness, or swelling Alignment: Symmetrical Functional ROM: Unrestricted ROM Stability: No instability detected Muscle Tone/Strength: Functionally intact. No obvious neuro-muscular anomalies detected. Sensory (Neurological): Unimpaired Muscle strength & Tone: Complains of area being tender to palpation  Lumbar Spine Area Exam  Skin & Axial Inspection: No masses, redness, or swelling Alignment: Symmetrical Functional ROM: Unrestricted ROM      Stability: No instability detected Muscle Tone/Strength: Functionally intact. No obvious neuro-muscular anomalies detected. Sensory (Neurological):  Unimpaired Palpation: Complains of area being tender to palpation       Provocative Tests: Lumbar Hyperextension and rotation test: evaluation deferred today       Lumbar Lateral bending test: evaluation deferred today       Patrick's Maneuver: evaluation deferred today                    Gait & Posture Assessment  Ambulation: Unassisted Gait: Relatively normal for age and body habitus Posture: WNL   Lower Extremity Exam    Side: Right lower extremity  Side: Left lower extremity  Skin & Extremity Inspection: Skin color, temperature, and hair growth are WNL. No peripheral edema or cyanosis. No masses, redness, swelling, asymmetry, or associated skin lesions. No contractures.  Skin & Extremity Inspection: Skin color, temperature, and hair growth are WNL. No peripheral edema or cyanosis. No masses, redness, swelling, asymmetry, or associated skin lesions. No contractures.  Functional ROM: Unrestricted ROM          Functional ROM: Unrestricted ROM          Muscle Tone/Strength: Functionally intact. No obvious neuro-muscular anomalies detected.  Muscle Tone/Strength: Functionally intact. No obvious neuro-muscular anomalies detected.  Sensory (Neurological): Unimpaired  Sensory (Neurological): Unimpaired  Palpation: No palpable anomalies  Palpation: No palpable anomalies   Assessment  Primary Diagnosis & Pertinent Problem List: The primary encounter diagnosis was Chronic upper back pain (Location of Tertiary source of pain). Diagnoses of Chronic low back pain (Location of Primary Source of Pain) (Bilateral) (R>L), Chronic hip pain (Location of Secondary source of pain) (Right), Chronic knee pain (Right), Chronic pain syndrome, and Long term current use of opiate analgesic were also pertinent to this visit.  Status Diagnosis  Controlled Controlled Controlled 1. Chronic upper back pain (Location of Tertiary source of pain)   2. Chronic low back pain (Location of Primary Source of Pain)  (Bilateral) (R>L)   3. Chronic hip pain (Location of Secondary source of pain) (Right)   4. Chronic knee pain (Right)   5. Chronic pain syndrome   6. Long term current use of opiate  analgesic     Problems updated and reviewed during this visit: No problems updated. Plan of Care  Pharmacotherapy (Medications Ordered): Meds ordered this encounter  Medications  . oxyCODONE (OXY IR/ROXICODONE) 5 MG immediate release tablet    Sig: Take 1 tablet (5 mg total) by mouth every 6 (six) hours as needed for severe pain (May take one additional tab as needed).    Dispense:  130 tablet    Refill:  0    Do not place this medication, or any other prescription from our practice, on "Automatic Refill". Patient may have prescription filled one day early if pharmacy is closed on scheduled refill date. Do not fill until:01/15/2018 To last until: 02/14/2018    Order Specific Question:   Supervising Provider    Answer:   Milinda Pointer [phone]  . oxyCODONE (OXY IR/ROXICODONE) 5 MG immediate release tablet    Sig: Take 1 tablet (5 mg total) by mouth every 6 (six) hours as needed for severe pain (May take one additional tab as needed).    Dispense:  130 tablet    Refill:  0    Do not place this medication, or any other prescription from our practice, on "Automatic Refill". Patient may have prescription filled one day early if pharmacy is closed on scheduled refill date. Do not fill until: 12/16/2017 To last until: 01/15/2018    Order Specific Question:   Supervising Provider    Answer:   Milinda Pointer [phone]  . oxyCODONE (OXY IR/ROXICODONE) 5 MG immediate release tablet    Sig: Take 1 tablet (5 mg total) by mouth every 6 (six) hours as needed for severe pain (May take one additional tab as needed).    Dispense:  130 tablet    Refill:  0    Do not place this medication, or any other prescription from our practice, on "Automatic Refill". Patient may have prescription filled one day early if pharmacy is  closed on scheduled refill date. Do not fill until: 11/16/2017 To last until:12/16/2017    Order Specific Question:   Supervising Provider    Answer:   Milinda Pointer [phone]  This SmartLink is deprecated. Use AVSMEDLIST instead to display the medication list for a patient. Medications administered today: Natale Lay had no medications administered during this visit. Lab-work, procedure(s), and/or referral(s): Orders Placed This Encounter  Procedures  . ToxASSURE Select 13 (MW), Urine   Imaging and/or referral(s): None  Interventional therapies: Planned, scheduled, and/or pending:   Not at this time.   Considering:  Diagnostic right-sided cervical epidural steroid injection  Diagnostic thoracic epidural steroid injection  Diagnostic bilateral thoracic facet block  Possible bilateral thoracic facet radiofrequency ablation  Diagnostic bilateral lumbar facet block  Possible bilateral lumbar facet radiofrequency ablation  Diagnostic right sacroiliac joint block  Possible right sacroiliac joint radiofrequency ablation  Diagnostic caudal epidural steroid injection and epidurogram  Diagnostic right-sided L4-5 transforaminal epidural steroid injection  Diagnostic right intra-articular knee injection  Diagnostic right genicular nerve block  Possible right genicular radiofrequency ablation    Palliative PRN treatment(s):  Palliativeright-sided cervical epidural steroid injection  Palliativethoracic epidural steroid injection  Palliativebilateral thoracic facet block  Palliativebilateral lumbar facet block  Palliativeright sacroiliac joint block  Palliativecaudal epidural steroid injection and epidurogram  Palliativeright-sided L4-5 transforaminal epidural steroid injection  Palliativeright intra-articular knee injection  Palliativeright genicular nerve block    Provider-requested follow-up: Return in 3 months (on 02/08/2018) for MedMgmt.  Future Appointments   Date Time Provider [place]  02/08/2018  9:00 AM Vevelyn Francois, NP [facility] None   Primary Care Physician: Ria Bush, MD Location: [facility] Outpatient Pain Management Facility Note by: Vevelyn Francois NP Date: 11/15/2017; Time: 1:37 PM  Pain Score Disclaimer: We use the NRS-11 scale. This is a self-reported, subjective measurement of pain severity with only modest accuracy. It is used primarily to identify changes within a particular patient. It must be understood that outpatient pain scales are significantly less accurate that those used for research, where they can be applied under ideal controlled circumstances with minimal exposure to variables. In reality, the score is likely to be a combination of pain intensity and pain affect, where pain affect describes the degree of emotional arousal or changes in action readiness caused by the sensory experience of pain. Factors such as social and work situation, setting, emotional state, anxiety levels, expectation, and prior pain experience may influence pain perception and show large inter-individual differences that may also be affected by time variables.  Patient instructions provided during this appointment: Patient Instructions    ____________________________________________________________________________________________  Medication Rules  Applies to: All patients receiving prescriptions (written or electronic).  Pharmacy of record: Pharmacy where electronic prescriptions will be sent. If written prescriptions are taken to a different pharmacy, please inform the nursing staff. The pharmacy listed in the electronic medical record should be the one where you would like electronic prescriptions to be sent.  Prescription refills: Only during scheduled appointments. Applies to both, written and electronic prescriptions.  NOTE: The following applies primarily to controlled substances (Opioid* Pain Medications).   Patient's  responsibilities: 1. Pain Pills: Bring all pain pills to every appointment (except for procedure appointments). 2. Pill Bottles: Bring pills in original pharmacy bottle. Always bring newest bottle. Bring bottle, even if empty. 3. Medication refills: You are responsible for knowing and keeping track of what medications you need refilled. The day before your appointment, write a list of all prescriptions that need to be refilled. Bring that list to your appointment and give it to the admitting nurse. Prescriptions will be written only during appointments. If you forget a medication, it will not be "Called in", "Faxed", or "electronically sent". You will need to get another appointment to get these prescribed. 4. Prescription Accuracy: You are responsible for carefully inspecting your prescriptions before leaving our office. Have the discharge nurse carefully go over each prescription with you, before taking them home. Make sure that your name is accurately spelled, that your address is correct. Check the name and dose of your medication to make sure it is accurate. Check the number of pills, and the written instructions to make sure they are clear and accurate. Make sure that you are given enough medication to last until your next medication refill appointment. 5. Taking Medication: Take medication as prescribed. Never take more pills than instructed. Never take medication more frequently than prescribed. Taking less pills or less frequently is permitted and encouraged, when it comes to controlled substances (written prescriptions).  6. Inform other Doctors: Always inform, all of your healthcare providers, of all the medications you take. 7. Pain Medication from other Providers: You are not allowed to accept any additional pain medication from any other Doctor or Healthcare provider. There are two exceptions to this rule. (see below) In the event that you require additional pain medication, you are responsible  for notifying us, as stated below. 8. Medication Agreement: You are responsible for carefully reading and following our Medication Agreement. This must be signed before  receiving any prescriptions from our practice. Safely store a copy of your signed Agreement. Violations to the Agreement will result in no further prescriptions. (Additional copies of our Medication Agreement are available upon request.) 9. Laws, Rules, & Regulations: All patients are expected to follow all Federal and [employer], [employer], Rules, [employer]. Ignorance of the Laws does not constitute a valid excuse. The use of any illegal substances is prohibited. 10. Adopted CDC guidelines & recommendations: Target dosing levels will be at or below 60 MME/day. Use of benzodiazepines** is not recommended.  Exceptions: There are only two exceptions to the rule of not receiving pain medications from other Healthcare Providers. 1. Exception #1 (Emergencies): In the event of an emergency (i.e.: accident requiring emergency care), you are allowed to receive additional pain medication. However, you are responsible for: As soon as you are able, call our office [phone], at any time of the day or night, and leave a message stating your name, the date and nature of the emergency, and the name and dose of the medication prescribed. In the event that your call is answered by a member of our staff, make sure to document and save the date, time, and the name of the person that took your information.  2. Exception #2 (Planned Surgery): In the event that you are scheduled by another doctor or dentist to have any type of surgery or procedure, you are allowed (for a period no longer than 30 days), to receive additional pain medication, for the acute post-op pain. However, in this case, you are responsible for picking up a copy of our "Post-op Pain Management for Surgeons" handout, and giving it to your surgeon or dentist. This document is available at  our office, and does not require an appointment to obtain it. Simply go to our office during business hours (Monday-Thursday from 8:00 AM to 4:00 PM) (Friday 8:00 AM to 12:00 Noon) or if you have a scheduled appointment with [place], prior to your surgery, and ask for it by name. In addition, you will need to provide [place] with your name, name of your surgeon, type of surgery, and date of procedure or surgery.  *Opioid medications include: morphine, codeine, oxycodone, oxymorphone, hydrocodone, hydromorphone, meperidine, tramadol, tapentadol, buprenorphine, fentanyl, methadone. **Benzodiazepine medications include: diazepam (Valium), alprazolam (Xanax), clonazepam (Klonopine), lorazepam (Ativan), clorazepate (Tranxene), chlordiazepoxide (Librium), estazolam (Prosom), oxazepam (Serax), temazepam (Restoril), triazolam (Halcion)  ____________________________________________________________________________________________   BMI Assessment: Estimated body mass index is 32.1 kg/m as calculated from the following:   Height as of this encounter: '6\' 2"'  (1.88 m).   Weight as of this encounter: 250 lb (113.4 kg).  BMI interpretation table: BMI level Category Range association with higher incidence of chronic pain  <18 kg/m2 Underweight   18.5-24.9 kg/m2 Ideal body weight   25-29.9 kg/m2 Overweight Increased incidence by 20%  30-34.9 kg/m2 Obese (Class I) Increased incidence by 68%  35-39.9 kg/m2 Severe obesity (Class II) Increased incidence by 136%  >40 kg/m2 Extreme obesity (Class III) Increased incidence by 254%   BMI Readings from Last 4 Encounters:  11/15/17 32.10 kg/m  11/09/17 32.10 kg/m  08/17/17 31.97 kg/m  05/17/17 35.82 kg/m   Wt Readings from Last 4 Encounters:  11/15/17 250 lb (113.4 kg)  11/09/17 250 lb (113.4 kg)  08/17/17 249 lb (112.9 kg)  02/18/17 279 lb (126.6 kg)

## 2017-11-15 NOTE — Patient Instructions (Addendum)
____________________________________________________________________________________________  Medication Rules  Applies to: All patients receiving prescriptions (written or electronic).  Pharmacy of record: Pharmacy where electronic prescriptions will be sent. If written prescriptions are taken to a different pharmacy, please inform the nursing staff. The pharmacy listed in the electronic medical record should be the one where you would like electronic prescriptions to be sent.  Prescription refills: Only during scheduled appointments. Applies to both, written and electronic prescriptions.  NOTE: The following applies primarily to controlled substances (Opioid* Pain Medications).   Patient's responsibilities: 1. Pain Pills: Bring all pain pills to every appointment (except for procedure appointments). 2. Pill Bottles: Bring pills in original pharmacy bottle. Always bring newest bottle. Bring bottle, even if empty. 3. Medication refills: You are responsible for knowing and keeping track of what medications you need refilled. The day before your appointment, write a list of all prescriptions that need to be refilled. Bring that list to your appointment and give it to the admitting nurse. Prescriptions will be written only during appointments. If you forget a medication, it will not be "Called in", "Faxed", or "electronically sent". You will need to get another appointment to get these prescribed. 4. Prescription Accuracy: You are responsible for carefully inspecting your prescriptions before leaving our office. Have the discharge nurse carefully go over each prescription with you, before taking them home. Make sure that your name is accurately spelled, that your address is correct. Check the name and dose of your medication to make sure it is accurate. Check the number of pills, and the written instructions to make sure they are clear and accurate. Make sure that you are given enough medication to  last until your next medication refill appointment. 5. Taking Medication: Take medication as prescribed. Never take more pills than instructed. Never take medication more frequently than prescribed. Taking less pills or less frequently is permitted and encouraged, when it comes to controlled substances (written prescriptions).  6. Inform other Doctors: Always inform, all of your healthcare providers, of all the medications you take. 7. Pain Medication from other Providers: You are not allowed to accept any additional pain medication from any other Doctor or Healthcare provider. There are two exceptions to this rule. (see below) In the event that you require additional pain medication, you are responsible for notifying us, as stated below. 8. Medication Agreement: You are responsible for carefully reading and following our Medication Agreement. This must be signed before receiving any prescriptions from our practice. Safely store a copy of your signed Agreement. Violations to the Agreement will result in no further prescriptions. (Additional copies of our Medication Agreement are available upon request.) 9. Laws, Rules, & Regulations: All patients are expected to follow all Federal and State Laws, Statutes, Rules, & Regulations. Ignorance of the Laws does not constitute a valid excuse. The use of any illegal substances is prohibited. 10. Adopted CDC guidelines & recommendations: Target dosing levels will be at or below 60 MME/day. Use of benzodiazepines** is not recommended.  Exceptions: There are only two exceptions to the rule of not receiving pain medications from other Healthcare Providers. 1. Exception #1 (Emergencies): In the event of an emergency (i.e.: accident requiring emergency care), you are allowed to receive additional pain medication. However, you are responsible for: As soon as you are able, call our office [phone], at any time of the day or night, and leave a message stating your  name, the date and nature of the emergency, and the name and dose of the medication   prescribed. In the event that your call is answered by a member of our staff, make sure to document and save the date, time, and the name of the person that took your information.  2. Exception #2 (Planned Surgery): In the event that you are scheduled by another doctor or dentist to have any type of surgery or procedure, you are allowed (for a period no longer than 30 days), to receive additional pain medication, for the acute post-op pain. However, in this case, you are responsible for picking up a copy of our "Post-op Pain Management for Surgeons" handout, and giving it to your surgeon or dentist. This document is available at our office, and does not require an appointment to obtain it. Simply go to our office during business hours (Monday-Thursday from 8:00 AM to 4:00 PM) (Friday 8:00 AM to 12:00 Noon) or if you have a scheduled appointment with [place], prior to your surgery, and ask for it by name. In addition, you will need to provide [place] with your name, name of your surgeon, type of surgery, and date of procedure or surgery.  *Opioid medications include: morphine, codeine, oxycodone, oxymorphone, hydrocodone, hydromorphone, meperidine, tramadol, tapentadol, buprenorphine, fentanyl, methadone. **Benzodiazepine medications include: diazepam (Valium), alprazolam (Xanax), clonazepam (Klonopine), lorazepam (Ativan), clorazepate (Tranxene), chlordiazepoxide (Librium), estazolam (Prosom), oxazepam (Serax), temazepam (Restoril), triazolam (Halcion)  ____________________________________________________________________________________________   BMI Assessment: Estimated body mass index is 32.1 kg/m as calculated from the following:   Height as of this encounter: 6\' 2"  (1.88 m).   Weight as of this encounter: 250 lb (113.4 kg).  BMI interpretation table: BMI level Category Range association with higher incidence of chronic pain   <18 kg/m2 Underweight   18.5-24.9 kg/m2 Ideal body weight   25-29.9 kg/m2 Overweight Increased incidence by 20%  30-34.9 kg/m2 Obese (Class I) Increased incidence by 68%  35-39.9 kg/m2 Severe obesity (Class II) Increased incidence by 136%  >40 kg/m2 Extreme obesity (Class III) Increased incidence by 254%   BMI Readings from Last 4 Encounters:  11/15/17 32.10 kg/m  11/09/17 32.10 kg/m  08/17/17 31.97 kg/m  05/17/17 35.82 kg/m   Wt Readings from Last 4 Encounters:  11/15/17 250 lb (113.4 kg)  11/09/17 250 lb (113.4 kg)  08/17/17 249 lb (112.9 kg)  02/18/17 279 lb (126.6 kg)

## 2017-11-18 LAB — TOXASSURE SELECT 13 (MW), URINE

## 2018-01-11 ENCOUNTER — Ambulatory Visit (INDEPENDENT_AMBULATORY_CARE_PROVIDER_SITE_OTHER): Payer: Medicare Other | Admitting: Family Medicine

## 2018-01-11 ENCOUNTER — Encounter: Payer: Self-pay | Admitting: Family Medicine

## 2018-01-11 ENCOUNTER — Other Ambulatory Visit: Payer: Self-pay | Admitting: Family Medicine

## 2018-01-11 VITALS — BP 126/84 | HR 70 | Temp 98.5°F | Wt 252.8 lb

## 2018-01-11 DIAGNOSIS — E538 Deficiency of other specified B group vitamins: Secondary | ICD-10-CM | POA: Diagnosis not present

## 2018-01-11 DIAGNOSIS — E119 Type 2 diabetes mellitus without complications: Secondary | ICD-10-CM | POA: Diagnosis not present

## 2018-01-11 DIAGNOSIS — E559 Vitamin D deficiency, unspecified: Secondary | ICD-10-CM | POA: Diagnosis not present

## 2018-01-11 DIAGNOSIS — N529 Male erectile dysfunction, unspecified: Secondary | ICD-10-CM | POA: Diagnosis not present

## 2018-01-11 DIAGNOSIS — I10 Essential (primary) hypertension: Secondary | ICD-10-CM | POA: Diagnosis not present

## 2018-01-11 LAB — VITAMIN B12: VITAMIN B 12: 203 pg/mL — AB (ref 211–911)

## 2018-01-11 LAB — BASIC METABOLIC PANEL
BUN: 16 mg/dL (ref 6–23)
CHLORIDE: 102 meq/L (ref 96–112)
CO2: 32 mEq/L (ref 19–32)
Calcium: 8.9 mg/dL (ref 8.4–10.5)
Creatinine, Ser: 0.86 mg/dL (ref 0.40–1.50)
GFR: 103.48 mL/min (ref >60.00)
Glucose, Bld: 116 mg/dL — ABNORMAL HIGH (ref 70–99)
POTASSIUM: 5.4 meq/L — AB (ref 3.5–5.1)
SODIUM: 138 meq/L (ref 135–145)

## 2018-01-11 LAB — LIPID PANEL
CHOL/HDL RATIO: 4
Cholesterol: 146 mg/dL (ref 0–200)
HDL: 41.7 mg/dL (ref >39.00)
LDL Cholesterol: 96 mg/dL (ref 0–99)
NONHDL: 104.59
Triglycerides: 44 mg/dL (ref 0.0–149.0)
VLDL: 8.8 mg/dL (ref 0.0–40.0)

## 2018-01-11 LAB — VITAMIN D 25 HYDROXY (VIT D DEFICIENCY, FRACTURES): VITD: 24.95 ng/mL — AB (ref 30.00–100.00)

## 2018-01-11 LAB — HEMOGLOBIN A1C: HEMOGLOBIN A1C: 5.4 % (ref 4.6–6.5)

## 2018-01-11 MED ORDER — LISINOPRIL 40 MG PO TABS: 40.0000 mg | ORAL_TABLET | Freq: Every day | ORAL | 3 refills | Status: DC

## 2018-01-11 MED ORDER — LISINOPRIL 20 MG PO TABS: 20.0000 mg | ORAL_TABLET | Freq: Every day | ORAL | 1 refills | Status: DC

## 2018-01-11 MED ORDER — SILDENAFIL CITRATE 20 MG PO TABS: 40.0000 mg | ORAL_TABLET | Freq: Every day | ORAL | 0 refills | Status: DC | PRN

## 2018-01-11 NOTE — Assessment & Plan Note (Signed)
Reviewed latest A1c with patient (01/2017).  Update labs today.

## 2018-01-11 NOTE — Assessment & Plan Note (Signed)
Check B12 level. Has not been taking vit B12 supplement.

## 2018-01-11 NOTE — Assessment & Plan Note (Signed)
Check vit D. Reports compliance with vit D daily.

## 2018-01-11 NOTE — Assessment & Plan Note (Signed)
Chronic, stable. Continue lisinopril 40mg  daily. Update labs.

## 2018-01-11 NOTE — Assessment & Plan Note (Signed)
Anticipate combination of organic, psychological, and med related (ACEI). Discussed this. Discussed treatment options. Will trial sildenafil 20mg  2-5 tab PRN. Reviewed need to avoid nitrates, and monitor for HA and priapism. Discussed generic sildenafil vs brand viagra, and how generic is not FDA approved for ED, but likely cheaper alternative. Pt will try and update me with effect.

## 2018-01-11 NOTE — Patient Instructions (Addendum)
Try generic viagra (sildenafil) 20mg  1-4 tablets as needed prior to relations.  Update me with effect. Lisinopril refilled today.  Labs today.  Return in 6 months for physical.

## 2018-01-11 NOTE — Progress Notes (Addendum)
BP 126/84 (BP Location: Left Arm, Patient Position: Sitting, Cuff Size: Large)   Pulse 70   Temp 98.5 F (36.9 C) (Oral)   Wt 252 lb 12 oz (114.6 kg)   SpO2 97%   BMI 32.45 kg/m    CC: f/u visit Subjective:    Patient ID: Larry Goodwin, male    DOB: 06/16/1976, 42 y.o.   MRN: [id number]  HPI: JESSON FOSKEY is a 42 y.o. male presenting on 01/11/2018 for 6 month follow up   Last visit 01/2017 - did not return for f/u.   HTN - Compliant with current antihypertensive regimen of lisinopril 40mg  daily.  Does not check blood pressures at home. No low blood pressure readings or symptoms of dizziness/syncope.  Denies HA, vision changes, CP/tightness, SOB, leg swelling.   DM - diet controlled. Off medication. Improved with weight loss.   Followed Q24mo by pain clinic for chronic back pain, on disability. S/p several surgeries in the past.   ED - trouble maintaining erection. Comes and goes. Does awaken with erection. Noticing over last 6-7 months. Sex drive/libido is present. Separated from wife for last 2 yrs - some stress over this. He has custody of children. Sexually active with a few partners in last year, 100% condom use. Declines STD screen. Doesn't feel stress playing a part. Denies chest pain, dyspnea.   No alcohol.  Non smoker.   Relevant past medical, surgical, family and social history reviewed and updated as indicated. Interim medical history since our last visit reviewed. Allergies and medications reviewed and updated. Outpatient Medications Prior to Visit  Medication Sig Dispense Refill  . Cholecalciferol (VITAMIN D3) 2000 units capsule Take 1 capsule (2,000 Units total) by mouth daily. (Patient taking differently: Take 4,000 Units by mouth daily. ) 90 capsule 0  . [START ON 01/15/2018] oxyCODONE (OXY IR/ROXICODONE) 5 MG immediate release tablet Take 1 tablet (5 mg total) by mouth every 6 (six) hours as needed for severe pain (May take one additional tab as needed). 130  tablet 0  . oxyCODONE (OXY IR/ROXICODONE) 5 MG immediate release tablet Take 1 tablet (5 mg total) by mouth every 6 (six) hours as needed for severe pain (May take one additional tab as needed). 130 tablet 0  . vitamin B-12 (CYANOCOBALAMIN) 1000 MCG tablet Take 1,000 mcg by mouth daily.    Marland Kitchen lisinopril (PRINIVIL,ZESTRIL) 40 MG tablet Take 1 tablet (40 mg total) by mouth daily. 90 tablet 3  . Magnesium Oxide 500 MG CAPS Take 1 capsule (500 mg total) by mouth daily. 90 capsule 0  . oxyCODONE (OXY IR/ROXICODONE) 5 MG immediate release tablet Take 1 tablet (5 mg total) by mouth every 6 (six) hours as needed for severe pain (May take one additional tab as needed). 130 tablet 0   No facility-administered medications prior to visit.      Per HPI unless specifically indicated in ROS section below Review of Systems     Objective:    BP 126/84 (BP Location: Left Arm, Patient Position: Sitting, Cuff Size: Large)   Pulse 70   Temp 98.5 F (36.9 C) (Oral)   Wt 252 lb 12 oz (114.6 kg)   SpO2 97%   BMI 32.45 kg/m   Wt Readings from Last 3 Encounters:  01/11/18 252 lb 12 oz (114.6 kg)  11/15/17 250 lb (113.4 kg)  11/09/17 250 lb (113.4 kg)    Physical Exam  Constitutional: He appears well-developed and well-nourished. No distress.  HENT:  Mouth/Throat:  Oropharynx is clear and moist. No oropharyngeal exudate.  Eyes: Conjunctivae are normal. Pupils are equal, round, and reactive to light.  Neck: Normal range of motion. Neck supple. No thyromegaly present.  Cardiovascular: Normal rate, regular rhythm, normal heart sounds and intact distal pulses.  No murmur heard. Pulmonary/Chest: Effort normal and breath sounds normal. No respiratory distress. He has no wheezes. He has no rales.  Genitourinary:  Genitourinary Comments: Declines GU exam  Musculoskeletal: He exhibits no edema.  Skin: Skin is warm and dry. No rash noted.  Psychiatric: His mood appears anxious.  Nursing note and vitals  reviewed.  Results for orders placed or performed in visit on 11/15/17  ToxASSURE Select 13 (MW), Urine  Result Value Ref Range   Summary FINAL    Lab Results  Component Value Date   HGBA1C 6.5 02/01/2017       Assessment & Plan:   Problem List Items Addressed This Visit    Erectile dysfunction - Primary    Anticipate combination of organic, psychological, and med related (ACEI). Discussed this. Discussed treatment options. Will trial sildenafil 20mg  2-5 tab PRN. Reviewed need to avoid nitrates, and monitor for HA and priapism. Discussed generic sildenafil vs brand viagra, and how generic is not FDA approved for ED, but likely cheaper alternative. Pt will try and update me with effect.      HTN (hypertension)    Chronic, stable. Continue lisinopril 40mg  daily. Update labs.       Relevant Medications   lisinopril (PRINIVIL,ZESTRIL) 40 MG tablet   sildenafil (REVATIO) 20 MG tablet   Type 2 diabetes, diet controlled ([employer])    Reviewed latest A1c with patient (01/2017).  Update labs today.       Relevant Medications   lisinopril (PRINIVIL,ZESTRIL) 40 MG tablet   Other Relevant Orders   Lipid panel   Hemoglobin W2N   Basic metabolic panel   Vitamin F62 deficiency    Check B12 level. Has not been taking vit B12 supplement.       Relevant Orders   Vitamin B12   Vitamin D deficiency    Check vit D. Reports compliance with vit D daily.      Relevant Orders   VITAMIN D 25 Hydroxy (Vit-D Deficiency, Fractures)       Follow up plan: Return in about 6 months (around 07/11/2018) for annual exam, prior fasting for blood work.  Ria Bush, MD

## 2018-02-08 ENCOUNTER — Encounter: Payer: Self-pay | Admitting: Nurse Practitioner

## 2018-02-08 ENCOUNTER — Ambulatory Visit: Payer: Medicare Other | Attending: Nurse Practitioner | Admitting: Nurse Practitioner

## 2018-02-08 ENCOUNTER — Other Ambulatory Visit: Payer: Self-pay

## 2018-02-08 VITALS — BP 133/97 | HR 71 | Temp 97.7°F | Resp 16 | Ht 74.0 in

## 2018-02-08 DIAGNOSIS — E559 Vitamin D deficiency, unspecified: Secondary | ICD-10-CM | POA: Insufficient documentation

## 2018-02-08 DIAGNOSIS — M4802 Spinal stenosis, cervical region: Secondary | ICD-10-CM | POA: Diagnosis not present

## 2018-02-08 DIAGNOSIS — Z6832 Body mass index (BMI) 32.0-32.9, adult: Secondary | ICD-10-CM | POA: Diagnosis not present

## 2018-02-08 DIAGNOSIS — M4726 Other spondylosis with radiculopathy, lumbar region: Secondary | ICD-10-CM | POA: Diagnosis not present

## 2018-02-08 DIAGNOSIS — M48061 Spinal stenosis, lumbar region without neurogenic claudication: Secondary | ICD-10-CM | POA: Diagnosis not present

## 2018-02-08 DIAGNOSIS — I10 Essential (primary) hypertension: Secondary | ICD-10-CM | POA: Insufficient documentation

## 2018-02-08 DIAGNOSIS — G894 Chronic pain syndrome: Secondary | ICD-10-CM

## 2018-02-08 DIAGNOSIS — M9983 Other biomechanical lesions of lumbar region: Secondary | ICD-10-CM | POA: Diagnosis not present

## 2018-02-08 DIAGNOSIS — M47814 Spondylosis without myelopathy or radiculopathy, thoracic region: Secondary | ICD-10-CM

## 2018-02-08 DIAGNOSIS — Z881 Allergy status to other antibiotic agents status: Secondary | ICD-10-CM | POA: Insufficient documentation

## 2018-02-08 DIAGNOSIS — M1711 Unilateral primary osteoarthritis, right knee: Secondary | ICD-10-CM | POA: Diagnosis not present

## 2018-02-08 DIAGNOSIS — Z5181 Encounter for therapeutic drug level monitoring: Secondary | ICD-10-CM | POA: Diagnosis not present

## 2018-02-08 DIAGNOSIS — F411 Generalized anxiety disorder: Secondary | ICD-10-CM | POA: Diagnosis not present

## 2018-02-08 DIAGNOSIS — G510 Bell's palsy: Secondary | ICD-10-CM | POA: Diagnosis not present

## 2018-02-08 DIAGNOSIS — M5116 Intervertebral disc disorders with radiculopathy, lumbar region: Secondary | ICD-10-CM | POA: Insufficient documentation

## 2018-02-08 DIAGNOSIS — E119 Type 2 diabetes mellitus without complications: Secondary | ICD-10-CM | POA: Insufficient documentation

## 2018-02-08 DIAGNOSIS — Z79891 Long term (current) use of opiate analgesic: Secondary | ICD-10-CM | POA: Insufficient documentation

## 2018-02-08 DIAGNOSIS — N529 Male erectile dysfunction, unspecified: Secondary | ICD-10-CM | POA: Insufficient documentation

## 2018-02-08 DIAGNOSIS — F329 Major depressive disorder, single episode, unspecified: Secondary | ICD-10-CM | POA: Insufficient documentation

## 2018-02-08 DIAGNOSIS — Z87891 Personal history of nicotine dependence: Secondary | ICD-10-CM | POA: Diagnosis not present

## 2018-02-08 DIAGNOSIS — E538 Deficiency of other specified B group vitamins: Secondary | ICD-10-CM | POA: Insufficient documentation

## 2018-02-08 DIAGNOSIS — Z79899 Other long term (current) drug therapy: Secondary | ICD-10-CM | POA: Diagnosis not present

## 2018-02-08 MED ORDER — OXYCODONE HCL 5 MG PO TABS: 5.0000 mg | ORAL_TABLET | Freq: Four times a day (QID) | ORAL | 0 refills | Status: DC | PRN

## 2018-02-08 NOTE — Progress Notes (Signed)
Patient's Name: Larry Goodwin  MRN: [id number]  Referring Provider: Ria Bush, MD  DOB: 12-02-1976  PCP: Ria Bush, MD  DOS: 02/08/2018  Note by: Vevelyn Francois NP  Service setting: Ambulatory outpatient  Specialty: Interventional Pain Management  Location: [facility] (AMB) Pain Management Facility    Patient type: Established    Primary Reason(s) for Visit: Encounter for prescription drug management. (Level of risk: moderate)  CC: Back Pain (lower)  HPI  Larry Goodwin is a 42 y.o. year old, male patient, who comes today for a medication management evaluation. He has Acute sinusitis; Lumbar DDD (degenerative disc disease); HTN (hypertension); Bell's palsy; Severe obesity (BMI 35.0-35.9 with comorbidity) ([place]); Chronic pain syndrome; Medicare annual wellness visit, initial; Type 2 diabetes, diet controlled ([place]); History of tobacco abuse; Low magnesium levels; Vitamin D deficiency; Myofascial pain syndrome; Lumbar facet syndrome (Bilateral) (R>L); Chronic upper back pain (Location of Tertiary source of pain); Generalized anxiety disorder; Depression; Lumbar spondylosis with radiculopathy; Epidural fibrosis; Failed back surgical syndrome; Encounter for therapeutic drug level monitoring; Long term prescription opiate use; Long term current use of opiate analgesic; Opiate use (30 MME/Day); Chronic low back pain (Location of Primary Source of Pain) (Bilateral) (R>L); Chronic sacroiliac joint pain (Right); Abnormal MRI, lumbar spine;  Abnormal CT myelogram of the thoracic spine; Thoracic disc herniation; Bulge of lumbar disc without myelopathy; Vertebral body hemangioma; Disturbance of skin sensation; Cervical IVDD (intervertebral disc displacement); 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 His primarily concern today is the Back Pain (lower)  Pain Assessment: Location: Lower, Mid, Upper Back Onset: More than a month ago Quality: Aching, Throbbing Severity: 3 /10 (self-reported pain score)  Note: Reported level is compatible with observation.                          Effect on ADL: no lifting, prolopnged standing, prolonged sitting Timing: Constant Modifying factors: medications  Larry Goodwin was last scheduled for an appointment on 11/15/2017 for medication management. During today's appointment we reviewed Larry Goodwin's chronic pain status, as well as his outpatient medication regimen. He admits that he pain is stable. He admits that he contineus to work on his weight. He denies any new concerns today.   The patient  reports that he does not use drugs. His body mass index is 32.45 kg/m.  Further details on both, my assessment(s), as well as the proposed treatment plan, please see below.  Controlled Substance Pharmacotherapy Assessment REMS (Risk Evaluation and Mitigation Strategy)  Analgesic:Oxycodone IR 5 mg every 6 hours (20 mg/day) MME/day:30 mg/day.   Larry Specking, RN  02/08/2018  9:14 AM  Sign at close encounter Nursing Pain Medication Assessment:  Safety precautions to be maintained throughout the outpatient stay will include: orient to surroundings, keep bed in low position, maintain call bell within reach at all times, provide assistance with transfer out of bed and ambulation.  Medication Inspection Compliance: Pill count conducted under aseptic conditions, in front of the patient. Neither the pills  nor the bottle was  removed from the patient's sight at any time. Once count was completed pills were immediately returned to the patient in their original bottle.  Medication: See above Pill/Patch Count: 25 of 130 pills remain Pill/Patch Appearance: Markings consistent with prescribed medication Bottle Appearance: Standard pharmacy container. Clearly labeled. Filled Date: 2 / 2 / 2019 Last Medication intake:  Today   Pharmacokinetics: Liberation and absorption (onset of action): WNL Distribution (time to peak effect): WNL Metabolism and excretion (duration of action): WNL         Pharmacodynamics: Desired effects: Analgesia: Larry Goodwin reports >50% benefit. Functional ability: Patient reports that medication allows him to accomplish basic ADLs Clinically meaningful improvement in function (CMIF): Sustained CMIF goals met Perceived effectiveness: Described as relatively effective, allowing for increase in activities of daily living (ADL) Undesirable effects: Side-effects or Adverse reactions: None reported Monitoring: [place] PMP: Online review of the past 48-monthperiod conducted. Compliant with practice rules and regulations Last UDS on record: Summary  Date Value Ref Range Status  11/15/2017 FINAL  Final    Comment:    ==================================================================== TOXASSURE SELECT 13 (MW) ==================================================================== Test                             Result       Flag       Units Drug Present and Declared for Prescription Verification   Oxycodone                      395          EXPECTED   ng/mg creat   Oxymorphone                    327          EXPECTED   ng/mg creat   Noroxycodone                   1004         EXPECTED   ng/mg creat   Noroxymorphone                 180          EXPECTED   ng/mg creat    Sources of oxycodone are scheduled prescription medications.    Oxymorphone, noroxycodone, and noroxymorphone  are expected    metabolites of oxycodone. Oxymorphone is also available as a    scheduled prescription medication. ==================================================================== Test                      Result    Flag   Units      Ref Range   Creatinine              181              mg/dL      >=20 ==================================================================== Declared Medications:  The flagging and interpretation on this report are based on the  following declared medications.  Unexpected results may arise from  inaccuracies in the declared medications.  **Note: The testing scope of this panel includes these medications:  Oxycodone  **Note: The testing scope of this panel does not include following  reported medications:  Cholecalciferol  Cyanocobalamin  Lisinopril  Magnesium Oxide ==================================================================== For clinical consultation, please call ([phone] ====================================================================    UDS interpretation: Compliant          Medication Assessment Form: Reviewed. Patient indicates being compliant  with therapy Treatment compliance: Compliant Risk Assessment Profile: Aberrant behavior: See prior evaluations. None observed or detected today Comorbid factors increasing risk of overdose: See prior notes. No additional risks detected today Risk of substance use disorder (SUD): Low Opioid Risk Tool - 02/08/18 0911      Family History of Substance Abuse   Alcohol  Negative  (Pended)     Illegal Drugs  Negative  (Pended)     Rx Drugs  Negative  (Pended)       Personal History of Substance Abuse   Alcohol  Negative  (Pended)     Illegal Drugs  Negative  (Pended)     Rx Drugs  Negative  (Pended)       Age   Age between 61-45 years   No  (Pended)       Psychological Disease   Psychological Disease  Negative  (Pended)     Depression  Negative  (Pended)       Total Score   Opioid  Risk Tool Scoring  0  (Pended)     Opioid Risk Interpretation  Low Risk  (Pended)       ORT Scoring interpretation table:  Score <3 = Low Risk for SUD  Score between 4-7 = Moderate Risk for SUD  Score >8 = High Risk for Opioid Abuse   Risk Mitigation Strategies:  Patient Counseling: Covered Patient-Prescriber Agreement (PPA): Present and active  Notification to other healthcare providers: Done  Pharmacologic Plan: No change in therapy, at this time.             Laboratory Chemistry  Inflammation Markers (CRP: Acute Phase) (ESR: Chronic Phase) Lab Results  Component Value Date   CRP 0.8 06/09/2016   ESRSEDRATE 6 06/09/2016                         Rheumatology Markers No results found for: Elayne Guerin, [facility]              Renal Function Markers Lab Results  Component Value Date   BUN 16 01/11/2018   CREATININE 0.86 01/11/2018   GFRAA >60 06/09/2016   GFRNONAA >60 06/09/2016                 Hepatic Function Markers Lab Results  Component Value Date   AST 11 02/01/2017   ALT 10 02/01/2017   ALBUMIN 4.2 02/01/2017   ALKPHOS 49 02/01/2017   LIPASE 153 09/21/2012                 Electrolytes Lab Results  Component Value Date   NA 138 01/11/2018   K 5.4 (H) 01/11/2018   CL 102 01/11/2018   CALCIUM 8.9 01/11/2018   MG 1.7 02/01/2017                        Neuropathy Markers Lab Results  Component Value Date   VITAMINB12 203 (L) 01/11/2018   HGBA1C 5.4 01/11/2018                 Bone Pathology Markers Lab Results  Component Value Date   VD25OH 24.95 (L) 01/11/2018   25OHVITD1 31 06/09/2016   25OHVITD2 6.6 06/09/2016   25OHVITD3 24 06/09/2016                         Coagulation Parameters Lab Results  Component Value Date  PLT 240 08/03/2014                 Cardiovascular Markers Lab Results  Component Value Date   CKTOTAL 46 07/10/2011   CKMB 1.4 07/10/2011   TROPONINI < 0.02 12/23/2011   HGB 14.9 08/03/2014    HCT 43.9 08/03/2014                 CA Markers No results found for: CEA, CA125, LABCA2               Note: Lab results reviewed.  Recent Diagnostic Imaging Results   Complexity Note: No new results found.                         Meds   Current Outpatient Medications:  .  Cholecalciferol (VITAMIN D3) 2000 units capsule, Take 1 capsule (2,000 Units total) by mouth daily. (Patient taking differently: Take 4,000 Units by mouth daily. ), Disp: 90 capsule, Rfl: 0 .  lisinopril (PRINIVIL,ZESTRIL) 20 MG tablet, Take 1 tablet (20 mg total) by mouth daily., Disp: 90 tablet, Rfl: 1 .  [START ON 04/15/2018] oxyCODONE (OXY IR/ROXICODONE) 5 MG immediate release tablet, Take 1 tablet (5 mg total) by mouth every 6 (six) hours as needed for severe pain (May take one additional tab as needed)., Disp: 130 tablet, Rfl: 0 .  sildenafil (REVATIO) 20 MG tablet, Take 2-5 tablets (40-100 mg total) by mouth daily as needed (relations)., Disp: 30 tablet, Rfl: 0 .  vitamin B-12 (CYANOCOBALAMIN) 1000 MCG tablet, Take 1,000 mcg by mouth daily., Disp: , Rfl:  .  Magnesium Oxide 500 MG CAPS, Take 1 capsule (500 mg total) by mouth daily., Disp: 90 capsule, Rfl: 0 .  [START ON 03/16/2018] oxyCODONE (OXY IR/ROXICODONE) 5 MG immediate release tablet, Take 1 tablet (5 mg total) by mouth every 6 (six) hours as needed for severe pain (May take one additional tab as needed)., Disp: 130 tablet, Rfl: 0 .  [START ON 02/14/2018] oxyCODONE (OXY IR/ROXICODONE) 5 MG immediate release tablet, Take 1 tablet (5 mg total) by mouth every 6 (six) hours as needed for severe pain (May take one additional tab as needed)., Disp: 130 tablet, Rfl: 0  ROS  Constitutional: Denies any fever or chills Gastrointestinal: No reported hemesis, hematochezia, vomiting, or acute GI distress Musculoskeletal: Denies any acute onset joint swelling, redness, loss of ROM, or weakness Neurological: No reported episodes of acute onset apraxia, aphasia, dysarthria,  agnosia, amnesia, paralysis, loss of coordination, or loss of consciousness  Allergies  Mr. Harrel is allergic to levaquin [levofloxacin in d5w].  [place]  Drug: Mr. Gaffey  reports that he does not use drugs. Alcohol:  reports that he does not drink alcohol. Tobacco:  reports that he quit smoking about 10 years ago. he has never used smokeless tobacco. Medical:  has a past medical history of Bell's palsy (2015), Chronic pain syndrome, DDD (degenerative disc disease), lumbar, HTN (hypertension), and Morbid obesity ([place]) (08/16/2014). Surgical: Mr. Dunavan  has a past surgical history that includes Back surgery (2012) and Cholecystectomy. Family: family history includes COPD in his mother; Hypertension in his other.  Constitutional Exam  General appearance: Well nourished, well developed, and well hydrated. In no apparent acute distress Vitals:   02/08/18 0901  BP: (!) 133/97  Pulse: 71  Resp: 16  Temp: 97.7 F (36.5 C)  SpO2: 100%  Height: '6\' 2"'$  (1.88 m)  Psych/Mental status: Alert, oriented x 3 (person, place, &  time)       Eyes: PERLA Respiratory: No evidence of acute respiratory distress  Cervical Spine Area Exam  Skin & Axial Inspection: No masses, redness, edema, swelling, or associated skin lesions Alignment: Symmetrical Functional ROM: Unrestricted ROM      Stability: No instability detected Muscle Tone/Strength: Functionally intact. No obvious neuro-muscular anomalies detected. Sensory (Neurological): Unimpaired Palpation: No palpable anomalies              Upper Extremity (UE) Exam    Side: Right upper extremity  Side: Left upper extremity  Skin & Extremity Inspection: Skin color, temperature, and hair growth are WNL. No peripheral edema or cyanosis. No masses, redness, swelling, asymmetry, or associated skin lesions. No contractures.  Skin & Extremity Inspection: Skin color, temperature, and hair growth are WNL. No peripheral edema or cyanosis. No masses, redness,  swelling, asymmetry, or associated skin lesions. No contractures.  Functional ROM: Unrestricted ROM          Functional ROM: Unrestricted ROM          Muscle Tone/Strength: Functionally intact. No obvious neuro-muscular anomalies detected.  Muscle Tone/Strength: Functionally intact. No obvious neuro-muscular anomalies detected.  Sensory (Neurological): Unimpaired          Sensory (Neurological): Unimpaired          Palpation: No palpable anomalies              Palpation: No palpable anomalies              Specialized Test(s): Deferred         Specialized Test(s): Deferred          Thoracic Spine Area Exam  Skin & Axial Inspection: No masses, redness, or swelling Alignment: Symmetrical Functional ROM: Unrestricted ROM Stability: No instability detected Muscle Tone/Strength: Functionally intact. No obvious neuro-muscular anomalies detected. Sensory (Neurological): Unimpaired Muscle strength & Tone: Complains of area being tender to palpation  Lumbar Spine Area Exam  Skin & Axial Inspection: No masses, redness, or swelling Alignment: Symmetrical Functional ROM: Unrestricted ROM      Stability: No instability detected Muscle Tone/Strength: Functionally intact. No obvious neuro-muscular anomalies detected. Sensory (Neurological): Unimpaired Palpation: No palpable anomalies       Provocative Tests: Lumbar Hyperextension and rotation test: evaluation deferred today       Lumbar Lateral bending test: evaluation deferred today       Patrick's Maneuver: evaluation deferred today                    Gait & Posture Assessment  Ambulation: Unassisted Gait: Relatively normal for age and body habitus Posture: WNL   Lower Extremity Exam    Side: Right lower extremity  Side: Left lower extremity  Skin & Extremity Inspection: Skin color, temperature, and hair growth are WNL. No peripheral edema or cyanosis. No masses, redness, swelling, asymmetry, or associated skin lesions. No contractures.  Skin &  Extremity Inspection: Skin color, temperature, and hair growth are WNL. No peripheral edema or cyanosis. No masses, redness, swelling, asymmetry, or associated skin lesions. No contractures.  Functional ROM: Unrestricted ROM          Functional ROM: Unrestricted ROM          Muscle Tone/Strength: Functionally intact. No obvious neuro-muscular anomalies detected.  Muscle Tone/Strength: Functionally intact. No obvious neuro-muscular anomalies detected.  Sensory (Neurological): Unimpaired  Sensory (Neurological): Unimpaired  Palpation: No palpable anomalies  Palpation: No palpable anomalies   Assessment  Primary Diagnosis & Pertinent Problem  List: The primary encounter diagnosis was Thoracic facet arthropathy. Diagnoses of Lumbar spondylosis with radiculopathy, Lumbar foraminal stenosis (Right L4-5), Primary osteoarthritis of right knee, Chronic pain syndrome, and Long term prescription opiate use were also pertinent to this visit.  Status Diagnosis  Controlled Controlled Controlled 1. Thoracic facet arthropathy   2. Lumbar spondylosis with radiculopathy   3. Lumbar foraminal stenosis (Right L4-5)   4. Primary osteoarthritis of right knee   5. Chronic pain syndrome   6. Long term prescription opiate use     Problems updated and reviewed during this visit: Problem  Erectile Dysfunction   Plan of Care  Pharmacotherapy (Medications Ordered): Meds ordered this encounter  Medications  . oxyCODONE (OXY IR/ROXICODONE) 5 MG immediate release tablet    Sig: Take 1 tablet (5 mg total) by mouth every 6 (six) hours as needed for severe pain (May take one additional tab as needed).    Dispense:  130 tablet    Refill:  0    Do not place this medication, or any other prescription from our practice, on "Automatic Refill". Patient may have prescription filled one day early if pharmacy is closed on scheduled refill date. Do not fill until: 04/15/2018 To last until: 05/15/2018    Order Specific Question:    Supervising Provider    Answer:   Milinda Pointer [phone]  . oxyCODONE (OXY IR/ROXICODONE) 5 MG immediate release tablet    Sig: Take 1 tablet (5 mg total) by mouth every 6 (six) hours as needed for severe pain (May take one additional tab as needed).    Dispense:  130 tablet    Refill:  0    Do not place this medication, or any other prescription from our practice, on "Automatic Refill". Patient may have prescription filled one day early if pharmacy is closed on scheduled refill date. Do not fill until: 03/16/2018 To last until: 04/15/2018    Order Specific Question:   Supervising Provider    Answer:   Milinda Pointer [phone]  . oxyCODONE (OXY IR/ROXICODONE) 5 MG immediate release tablet    Sig: Take 1 tablet (5 mg total) by mouth every 6 (six) hours as needed for severe pain (May take one additional tab as needed).    Dispense:  130 tablet    Refill:  0    Do not place this medication, or any other prescription from our practice, on "Automatic Refill". Patient may have prescription filled one day early if pharmacy is closed on scheduled refill date. Do not fill until: 02/14/2018 To last until:03/16/2018    Order Specific Question:   Supervising Provider    Answer:   Milinda Pointer [[id number]]   New Prescriptions   No medications on file   Medications administered today: Natale Lay had no medications administered during this visit. Lab-work, procedure(s), and/or referral(s): Orders Placed This Encounter  Procedures  . ToxASSURE Select 13 (MW), Urine   Imaging and/or referral(s): None  Interventional therapies: Planned, scheduled, and/or pending:   Not at this time.   Considering:  Diagnostic right-sided cervical epidural steroid injection  Diagnostic thoracic epidural steroid injection  Diagnostic bilateral thoracic facet block  Possible bilateral thoracic facet radiofrequency ablation  Diagnostic bilateral lumbar facet block  Possible bilateral lumbar facet  radiofrequency ablation  Diagnostic right sacroiliac joint block  Possible right sacroiliac joint radiofrequency ablation  Diagnostic caudal epidural steroid injection and epidurogram  Diagnostic right-sided L4-5 transforaminal epidural steroid injection  Diagnostic right intra-articular knee injection  Diagnostic right genicular  nerve block  Possible right genicular radiofrequency ablation    Palliative PRN treatment(s):  Palliativeright-sided cervical epidural steroid injection  Palliativethoracic epidural steroid injection  Palliativebilateral thoracic facet block  Palliativebilateral lumbar facet block  Palliativeright sacroiliac joint block  Palliativecaudal epidural steroid injection and epidurogram  Palliativeright-sided L4-5 transforaminal epidural steroid injection  Palliativeright intra-articular knee injection  Palliativeright genicular nerve block    Provider-requested follow-up: Return in about 3 months (around 05/08/2018) for MedMgmt with Me Donella Stade [facility] Pace).  Future Appointments  Date Time Provider [place]  05/02/2018  9:00 AM Vevelyn Francois, NP [facility] None  07/06/2018  1:00 PM Eustace Pen, LPN [facility]  1/61/0960  3:00 PM Ria Bush, MD [facility]   Primary Care Physician: Ria Bush, MD Location: [facility] Note by: Vevelyn Francois NP Date: 02/08/2018; Time: 11:11 AM  Pain Score Disclaimer: We use the NRS-11 scale. This is a self-reported, subjective measurement of pain severity with only modest accuracy. It is used primarily to identify changes within a particular patient. It must be understood that outpatient pain scales are significantly less accurate that those used for research, where they can be applied under ideal controlled circumstances with minimal exposure to variables. In reality, the score is likely to be a combination of pain intensity and pain affect, where pain affect  describes the degree of emotional arousal or changes in action readiness caused by the sensory experience of pain. Factors such as social and work situation, setting, emotional state, anxiety levels, expectation, and prior pain experience may influence pain perception and show large inter-individual differences that may also be affected by time variables.  Patient instructions provided during this appointment: Patient Instructions   ____________________________________________________________________________________________  Medication Rules  Applies to: All patients receiving prescriptions (written or electronic).  Pharmacy of record: Pharmacy where electronic prescriptions will be sent. If written prescriptions are taken to a different pharmacy, please inform the nursing staff. The pharmacy listed in the electronic medical record should be the one where you would like electronic prescriptions to be sent.  Prescription refills: Only during scheduled appointments. Applies to both, written and electronic prescriptions.  NOTE: The following applies primarily to controlled substances (Opioid* Pain Medications).   Patient's responsibilities: 1. Pain Pills: Bring all pain pills to every appointment (except for procedure appointments). 2. Pill Bottles: Bring pills in original pharmacy bottle. Always bring newest bottle. Bring bottle, even if empty. 3. Medication refills: You are responsible for knowing and keeping track of what medications you need refilled. The day before your appointment, write a list of all prescriptions that need to be refilled. Bring that list to your appointment and give it to the admitting nurse. Prescriptions will be written only during appointments. If you forget a medication, it will not be "Called in", "Faxed", or "electronically sent". You will need to get another appointment to get these prescribed. 4. Prescription Accuracy: You are responsible for carefully inspecting  your prescriptions before leaving our office. Have the discharge nurse carefully go over each prescription with you, before taking them home. Make sure that your name is accurately spelled, that your address is correct. Check the name and dose of your medication to make sure it is accurate. Check the number of pills, and the written instructions to make sure they are clear and accurate. Make sure that you are given enough medication to last until your next medication refill appointment. 5. Taking Medication: Take medication as prescribed. Never take more pills than instructed.  Never take medication more frequently than prescribed. Taking less pills or less frequently is permitted and encouraged, when it comes to controlled substances (written prescriptions).  6. Inform other Doctors: Always inform, all of your healthcare providers, of all the medications you take. 7. Pain Medication from other Providers: You are not allowed to accept any additional pain medication from any other Doctor or Healthcare provider. There are two exceptions to this rule. (see below) In the event that you require additional pain medication, you are responsible for notifying us, as stated below. 8. Medication Agreement: You are responsible for carefully reading and following our Medication Agreement. This must be signed before receiving any prescriptions from our practice. Safely store a copy of your signed Agreement. Violations to the Agreement will result in no further prescriptions. (Additional copies of our Medication Agreement are available upon request.) 9. Laws, Rules, & Regulations: All patients are expected to follow all Federal and [employer], [employer], Rules, [employer]. Ignorance of the Laws does not constitute a valid excuse. The use of any illegal substances is prohibited. 10. Adopted CDC guidelines & recommendations: Target dosing levels will be at or below 60 MME/day. Use of benzodiazepines** is not  recommended.  Exceptions: There are only two exceptions to the rule of not receiving pain medications from other Healthcare Providers. 1. Exception #1 (Emergencies): In the event of an emergency (i.e.: accident requiring emergency care), you are allowed to receive additional pain medication. However, you are responsible for: As soon as you are able, call our office [phone], at any time of the day or night, and leave a message stating your name, the date and nature of the emergency, and the name and dose of the medication prescribed. In the event that your call is answered by a member of our staff, make sure to document and save the date, time, and the name of the person that took your information.  2. Exception #2 (Planned Surgery): In the event that you are scheduled by another doctor or dentist to have any type of surgery or procedure, you are allowed (for a period no longer than 30 days), to receive additional pain medication, for the acute post-op pain. However, in this case, you are responsible for picking up a copy of our "Post-op Pain Management for Surgeons" handout, and giving it to your surgeon or dentist. This document is available at our office, and does not require an appointment to obtain it. Simply go to our office during business hours (Monday-Thursday from 8:00 AM to 4:00 PM) (Friday 8:00 AM to 12:00 Noon) or if you have a scheduled appointment with [place], prior to your surgery, and ask for it by name. In addition, you will need to provide [place] with your name, name of your surgeon, type of surgery, and date of procedure or surgery.  *Opioid medications include: morphine, codeine, oxycodone, oxymorphone, hydrocodone, hydromorphone, meperidine, tramadol, tapentadol, buprenorphine, fentanyl, methadone. **Benzodiazepine medications include: diazepam (Valium), alprazolam (Xanax), clonazepam (Klonopine), lorazepam (Ativan), clorazepate (Tranxene), chlordiazepoxide (Librium), estazolam (Prosom),  oxazepam (Serax), temazepam (Restoril), triazolam (Halcion) ____________________________________________________________________________________________   BMI Assessment: Estimated body mass index is 32.45 kg/m as calculated from the following:   Height as of this encounter: '6\' 2"'$  (1.88 m).   Weight as of 01/11/18: 252 lb 12 oz (114.6 kg).  BMI interpretation table: BMI level Category Range association with higher incidence of chronic pain  <18 kg/m2 Underweight   18.5-24.9 kg/m2 Ideal body weight   25-29.9 kg/m2 Overweight Increased incidence by 20%  30-34.9 kg/m2 Obese (Class I) Increased incidence by 68%  35-39.9 kg/m2 Severe obesity (Class II) Increased incidence by 136%  >40 kg/m2 Extreme obesity (Class III) Increased incidence by 254%   BMI Readings from Last 4 Encounters:  02/08/18 32.45 kg/m  01/11/18 32.45 kg/m  11/15/17 32.10 kg/m  11/09/17 32.10 kg/m   Wt Readings from Last 4 Encounters:  01/11/18 252 lb 12 oz (114.6 kg)  11/15/17 250 lb (113.4 kg)  11/09/17 250 lb (113.4 kg)  08/17/17 249 lb (112.9 kg)

## 2018-02-08 NOTE — Progress Notes (Signed)
Nursing Pain Medication Assessment:  Safety precautions to be maintained throughout the outpatient stay will include: orient to surroundings, keep bed in low position, maintain call bell within reach at all times, provide assistance with transfer out of bed and ambulation.  Medication Inspection Compliance: Pill count conducted under aseptic conditions, in front of the patient. Neither the pills nor the bottle was removed from the patient's sight at any time. Once count was completed pills were immediately returned to the patient in their original bottle.  Medication: See above Pill/Patch Count: 25 of 130 pills remain Pill/Patch Appearance: Markings consistent with prescribed medication Bottle Appearance: Standard pharmacy container. Clearly labeled. Filled Date: 2 / 2 / 2019 Last Medication intake:  Today

## 2018-02-08 NOTE — Patient Instructions (Addendum)
____________________________________________________________________________________________  Medication Rules  Applies to: All patients receiving prescriptions (written or electronic).  Pharmacy of record: Pharmacy where electronic prescriptions will be sent. If written prescriptions are taken to a different pharmacy, please inform the nursing staff. The pharmacy listed in the electronic medical record should be the one where you would like electronic prescriptions to be sent.  Prescription refills: Only during scheduled appointments. Applies to both, written and electronic prescriptions.  NOTE: The following applies primarily to controlled substances (Opioid* Pain Medications).   Patient's responsibilities: 1. Pain Pills: Bring all pain pills to every appointment (except for procedure appointments). 2. Pill Bottles: Bring pills in original pharmacy bottle. Always bring newest bottle. Bring bottle, even if empty. 3. Medication refills: You are responsible for knowing and keeping track of what medications you need refilled. The day before your appointment, write a list of all prescriptions that need to be refilled. Bring that list to your appointment and give it to the admitting nurse. Prescriptions will be written only during appointments. If you forget a medication, it will not be "Called in", "Faxed", or "electronically sent". You will need to get another appointment to get these prescribed. 4. Prescription Accuracy: You are responsible for carefully inspecting your prescriptions before leaving our office. Have the discharge nurse carefully go over each prescription with you, before taking them home. Make sure that your name is accurately spelled, that your address is correct. Check the name and dose of your medication to make sure it is accurate. Check the number of pills, and the written instructions to make sure they are clear and accurate. Make sure that you are given enough medication to last  until your next medication refill appointment. 5. Taking Medication: Take medication as prescribed. Never take more pills than instructed. Never take medication more frequently than prescribed. Taking less pills or less frequently is permitted and encouraged, when it comes to controlled substances (written prescriptions).  6. Inform other Doctors: Always inform, all of your healthcare providers, of all the medications you take. 7. Pain Medication from other Providers: You are not allowed to accept any additional pain medication from any other Doctor or Healthcare provider. There are two exceptions to this rule. (see below) In the event that you require additional pain medication, you are responsible for notifying us, as stated below. 8. Medication Agreement: You are responsible for carefully reading and following our Medication Agreement. This must be signed before receiving any prescriptions from our practice. Safely store a copy of your signed Agreement. Violations to the Agreement will result in no further prescriptions. (Additional copies of our Medication Agreement are available upon request.) 9. Laws, Rules, & Regulations: All patients are expected to follow all Federal and State Laws, Statutes, Rules, & Regulations. Ignorance of the Laws does not constitute a valid excuse. The use of any illegal substances is prohibited. 10. Adopted CDC guidelines & recommendations: Target dosing levels will be at or below 60 MME/day. Use of benzodiazepines** is not recommended.  Exceptions: There are only two exceptions to the rule of not receiving pain medications from other Healthcare Providers. 1. Exception #1 (Emergencies): In the event of an emergency (i.e.: accident requiring emergency care), you are allowed to receive additional pain medication. However, you are responsible for: As soon as you are able, call our office [phone], at any time of the day or night, and leave a message stating your name, the  date and nature of the emergency, and the name and dose of the medication   prescribed. In the event that your call is answered by a member of our staff, make sure to document and save the date, time, and the name of the person that took your information.  2. Exception #2 (Planned Surgery): In the event that you are scheduled by another doctor or dentist to have any type of surgery or procedure, you are allowed (for a period no longer than 30 days), to receive additional pain medication, for the acute post-op pain. However, in this case, you are responsible for picking up a copy of our "Post-op Pain Management for Surgeons" handout, and giving it to your surgeon or dentist. This document is available at our office, and does not require an appointment to obtain it. Simply go to our office during business hours (Monday-Thursday from 8:00 AM to 4:00 PM) (Friday 8:00 AM to 12:00 Noon) or if you have a scheduled appointment with [place], prior to your surgery, and ask for it by name. In addition, you will need to provide [place] with your name, name of your surgeon, type of surgery, and date of procedure or surgery.  *Opioid medications include: morphine, codeine, oxycodone, oxymorphone, hydrocodone, hydromorphone, meperidine, tramadol, tapentadol, buprenorphine, fentanyl, methadone. **Benzodiazepine medications include: diazepam (Valium), alprazolam (Xanax), clonazepam (Klonopine), lorazepam (Ativan), clorazepate (Tranxene), chlordiazepoxide (Librium), estazolam (Prosom), oxazepam (Serax), temazepam (Restoril), triazolam (Halcion) ____________________________________________________________________________________________   BMI Assessment: Estimated body mass index is 32.45 kg/m as calculated from the following:   Height as of this encounter: 6\' 2"  (1.88 m).   Weight as of 01/11/18: 252 lb 12 oz (114.6 kg).  BMI interpretation table: BMI level Category Range association with higher incidence of chronic pain  <18 kg/m2  Underweight   18.5-24.9 kg/m2 Ideal body weight   25-29.9 kg/m2 Overweight Increased incidence by 20%  30-34.9 kg/m2 Obese (Class I) Increased incidence by 68%  35-39.9 kg/m2 Severe obesity (Class II) Increased incidence by 136%  >40 kg/m2 Extreme obesity (Class III) Increased incidence by 254%   BMI Readings from Last 4 Encounters:  02/08/18 32.45 kg/m  01/11/18 32.45 kg/m  11/15/17 32.10 kg/m  11/09/17 32.10 kg/m   Wt Readings from Last 4 Encounters:  01/11/18 252 lb 12 oz (114.6 kg)  11/15/17 250 lb (113.4 kg)  11/09/17 250 lb (113.4 kg)  08/17/17 249 lb (112.9 kg)

## 2018-03-08 ENCOUNTER — Encounter: Payer: Self-pay | Admitting: Family Medicine

## 2018-03-08 ENCOUNTER — Ambulatory Visit: Payer: Medicare Other | Admitting: Family Medicine

## 2018-03-08 ENCOUNTER — Ambulatory Visit (INDEPENDENT_AMBULATORY_CARE_PROVIDER_SITE_OTHER): Payer: Medicare Other | Admitting: Family Medicine

## 2018-03-08 VITALS — BP 152/100 | HR 85 | Temp 98.5°F

## 2018-03-08 DIAGNOSIS — J019 Acute sinusitis, unspecified: Secondary | ICD-10-CM | POA: Diagnosis not present

## 2018-03-08 MED ORDER — AMOXICILLIN-POT CLAVULANATE 875-125 MG PO TABS: 1.0000 | ORAL_TABLET | Freq: Two times a day (BID) | ORAL | 0 refills | Status: AC

## 2018-03-08 NOTE — Progress Notes (Signed)
BP (!) 152/100 (BP Location: Right Arm, Cuff Size: Large)   Pulse 85   Temp 98.5 F (36.9 C) (Oral)   SpO2 97%    CC: sinus problems. Subjective:    Patient ID: Larry Goodwin, male    DOB: 06-16-1976, 42 y.o.   MRN: [id number]  HPI: Larry Goodwin is a 42 y.o. male presenting on 03/08/2018 for Sinus Problem (Facial pressure, sore throat, sinus drainagig, coughm sinus congestion and HA. Sxs started about 1 wk ago. Has HTN so not sure what to take. H/o sinus infections. )   5d h/o head congestion, nasal dryness, ST with PNDrianage, productive cough, trouble sleeping due to malaise. Chills, ears feel congested/muffled. Sinus pressure. Some nose bleeds.   No fevers, tooth pain, headache, dyspnea or wheezing.   Has tried nothing for this. Doesn't tolerate nasal sprays well.   No sick contacts at home. He was at [employer] this weekend.  Non smoker No h/o asthma. bp at home running well controlled.   Relevant past medical, surgical, family and social history reviewed and updated as indicated. Interim medical history since our last visit reviewed. Allergies and medications reviewed and updated. Outpatient Medications Prior to Visit  Medication Sig Dispense Refill  . Cholecalciferol (VITAMIN D3) 2000 units capsule Take 1 capsule (2,000 Units total) by mouth daily. (Patient taking differently: Take 4,000 Units by mouth daily. ) 90 capsule 0  . lisinopril (PRINIVIL,ZESTRIL) 20 MG tablet Take 1 tablet (20 mg total) by mouth daily. 90 tablet 1  . [START ON 04/15/2018] oxyCODONE (OXY IR/ROXICODONE) 5 MG immediate release tablet Take 1 tablet (5 mg total) by mouth every 6 (six) hours as needed for severe pain (May take one additional tab as needed). 130 tablet 0  . [START ON 03/16/2018] oxyCODONE (OXY IR/ROXICODONE) 5 MG immediate release tablet Take 1 tablet (5 mg total) by mouth every 6 (six) hours as needed for severe pain (May take one additional tab as needed). 130 tablet 0  .  oxyCODONE (OXY IR/ROXICODONE) 5 MG immediate release tablet Take 1 tablet (5 mg total) by mouth every 6 (six) hours as needed for severe pain (May take one additional tab as needed). 130 tablet 0  . sildenafil (REVATIO) 20 MG tablet Take 2-5 tablets (40-100 mg total) by mouth daily as needed (relations). 30 tablet 0  . vitamin B-12 (CYANOCOBALAMIN) 1000 MCG tablet Take 1,000 mcg by mouth daily.    . Magnesium Oxide 500 MG CAPS Take 1 capsule (500 mg total) by mouth daily. 90 capsule 0   No facility-administered medications prior to visit.      Per HPI unless specifically indicated in ROS section below Review of Systems     Objective:    BP (!) 152/100 (BP Location: Right Arm, Cuff Size: Large)   Pulse 85   Temp 98.5 F (36.9 C) (Oral)   SpO2 97%   Wt Readings from Last 3 Encounters:  01/11/18 252 lb 12 oz (114.6 kg)  11/15/17 250 lb (113.4 kg)  11/09/17 250 lb (113.4 kg)    Physical Exam  Constitutional: He appears well-developed and well-nourished. No distress.  HENT:  Head: Normocephalic and atraumatic.  Right Ear: Hearing, tympanic membrane, external ear and ear canal normal.  Left Ear: Hearing, tympanic membrane, external ear and ear canal normal.  Nose: Mucosal edema (nasal mucosal erythema and congestion) present. No rhinorrhea. Right sinus exhibits maxillary sinus tenderness and frontal sinus tenderness. Left sinus exhibits maxillary sinus tenderness and frontal  sinus tenderness.  Mouth/Throat: Uvula is midline and mucous membranes are normal. Posterior oropharyngeal edema (L>R) and posterior oropharyngeal erythema present. No oropharyngeal exudate or tonsillar abscesses.  Mild sinus tenderness  Eyes: Pupils are equal, round, and reactive to light. Conjunctivae and EOM are normal. No scleral icterus.  Neck: Normal range of motion. Neck supple.  Cardiovascular: Normal rate, regular rhythm, normal heart sounds and intact distal pulses.  No murmur heard. Pulmonary/Chest:  Effort normal and breath sounds normal. No respiratory distress. He has no wheezes. He has no rales.  Lungs clear  Lymphadenopathy:    He has no cervical adenopathy.  Skin: Skin is warm and dry. No rash noted.  Nursing note and vitals reviewed.     Assessment & Plan:   Problem List Items Addressed This Visit    Acute sinusitis - Primary    Of short duration, anticipate viral. Does not tolerate nasal sprays. rec supportive care - NSAIDs, mucinex, fluids, rest. WASP for augmentin course printed out for patient with indications when to fill.       Relevant Medications   amoxicillin-clavulanate (AUGMENTIN) 875-125 MG tablet       Meds ordered this encounter  Medications  . amoxicillin-clavulanate (AUGMENTIN) 875-125 MG tablet    Sig: Take 1 tablet by mouth 2 (two) times daily for 10 days.    Dispense:  20 tablet    Refill:  0   No orders of the defined types were placed in this encounter.   Follow up plan: Return if symptoms worsen or fail to improve.  Ria Bush, MD

## 2018-03-08 NOTE — Assessment & Plan Note (Addendum)
Of short duration, anticipate viral. Does not tolerate nasal sprays. rec supportive care - NSAIDs, mucinex, fluids, rest. WASP for augmentin course printed out for patient with indications when to fill.

## 2018-03-08 NOTE — Patient Instructions (Signed)
You have a sinus infection, likely viral. Take medicine as prescribed: ibuprofen 600mg  twice daily with meals for next several days. May use plain mucinex with plenty of fluid to help mobilize mucous. Push fluids and plenty of rest. If fever >101, worsening productive cough or not improving with this, fill augmentin antibiotic printed out today.

## 2018-05-02 ENCOUNTER — Ambulatory Visit: Payer: Medicare Other | Attending: Nurse Practitioner | Admitting: Nurse Practitioner

## 2018-05-02 ENCOUNTER — Encounter: Payer: Self-pay | Admitting: Nurse Practitioner

## 2018-05-02 ENCOUNTER — Other Ambulatory Visit: Payer: Self-pay

## 2018-05-02 VITALS — BP 133/105 | HR 70 | Temp 98.2°F | Ht 74.0 in | Wt 240.0 lb

## 2018-05-02 DIAGNOSIS — E119 Type 2 diabetes mellitus without complications: Secondary | ICD-10-CM | POA: Diagnosis not present

## 2018-05-02 DIAGNOSIS — Z87891 Personal history of nicotine dependence: Secondary | ICD-10-CM | POA: Insufficient documentation

## 2018-05-02 DIAGNOSIS — M5124 Other intervertebral disc displacement, thoracic region: Secondary | ICD-10-CM | POA: Insufficient documentation

## 2018-05-02 DIAGNOSIS — M5116 Intervertebral disc disorders with radiculopathy, lumbar region: Secondary | ICD-10-CM | POA: Diagnosis not present

## 2018-05-02 DIAGNOSIS — M4726 Other spondylosis with radiculopathy, lumbar region: Secondary | ICD-10-CM

## 2018-05-02 DIAGNOSIS — G894 Chronic pain syndrome: Secondary | ICD-10-CM | POA: Diagnosis not present

## 2018-05-02 DIAGNOSIS — Z6835 Body mass index (BMI) 35.0-35.9, adult: Secondary | ICD-10-CM | POA: Diagnosis not present

## 2018-05-02 DIAGNOSIS — R790 Abnormal level of blood mineral: Secondary | ICD-10-CM

## 2018-05-02 DIAGNOSIS — Z79899 Other long term (current) drug therapy: Secondary | ICD-10-CM | POA: Diagnosis not present

## 2018-05-02 DIAGNOSIS — M4802 Spinal stenosis, cervical region: Secondary | ICD-10-CM | POA: Insufficient documentation

## 2018-05-02 DIAGNOSIS — N529 Male erectile dysfunction, unspecified: Secondary | ICD-10-CM | POA: Insufficient documentation

## 2018-05-02 DIAGNOSIS — F329 Major depressive disorder, single episode, unspecified: Secondary | ICD-10-CM | POA: Insufficient documentation

## 2018-05-02 DIAGNOSIS — M545 Low back pain: Secondary | ICD-10-CM | POA: Diagnosis present

## 2018-05-02 DIAGNOSIS — M48061 Spinal stenosis, lumbar region without neurogenic claudication: Secondary | ICD-10-CM | POA: Insufficient documentation

## 2018-05-02 DIAGNOSIS — G8929 Other chronic pain: Secondary | ICD-10-CM

## 2018-05-02 DIAGNOSIS — M533 Sacrococcygeal disorders, not elsewhere classified: Secondary | ICD-10-CM

## 2018-05-02 DIAGNOSIS — F411 Generalized anxiety disorder: Secondary | ICD-10-CM | POA: Diagnosis not present

## 2018-05-02 DIAGNOSIS — I10 Essential (primary) hypertension: Secondary | ICD-10-CM | POA: Insufficient documentation

## 2018-05-02 DIAGNOSIS — Z79891 Long term (current) use of opiate analgesic: Secondary | ICD-10-CM | POA: Insufficient documentation

## 2018-05-02 DIAGNOSIS — E559 Vitamin D deficiency, unspecified: Secondary | ICD-10-CM | POA: Insufficient documentation

## 2018-05-02 DIAGNOSIS — M5136 Other intervertebral disc degeneration, lumbar region: Secondary | ICD-10-CM

## 2018-05-02 DIAGNOSIS — E538 Deficiency of other specified B group vitamins: Secondary | ICD-10-CM | POA: Insufficient documentation

## 2018-05-02 DIAGNOSIS — M1711 Unilateral primary osteoarthritis, right knee: Secondary | ICD-10-CM | POA: Insufficient documentation

## 2018-05-02 DIAGNOSIS — M25551 Pain in right hip: Secondary | ICD-10-CM | POA: Insufficient documentation

## 2018-05-02 MED ORDER — OXYCODONE HCL 5 MG PO TABS: 5.0000 mg | ORAL_TABLET | Freq: Four times a day (QID) | ORAL | 0 refills | Status: DC | PRN

## 2018-05-02 NOTE — Progress Notes (Signed)
Patient's Name: Larry Goodwin  MRN: [id number]  Referring Provider: Ria Bush, MD  DOB: 04-23-1976  PCP: Ria Bush, MD  DOS: 05/02/2018  Note by: Vevelyn Francois NP  Service setting: Ambulatory outpatient  Specialty: Interventional Pain Management  Location: [facility] (AMB) Pain Management Facility    Patient type: Established    Primary Reason(s) for Visit: Encounter for prescription drug management. (Level of risk: moderate)  CC: Back Pain (mid to lower)  HPI  Mr. Minshall is a 42 y.o. year old, male patient, who comes today for a medication management evaluation. He has Acute sinusitis; Lumbar DDD (degenerative disc disease); HTN (hypertension); Bell's palsy; Severe obesity (BMI 35.0-35.9 with comorbidity) ([place]); Chronic pain syndrome; Medicare annual wellness visit, initial; Type 2 diabetes, diet controlled ([place]); History of tobacco abuse; Low magnesium levels; Vitamin D deficiency; Myofascial pain syndrome; Lumbar facet syndrome (Bilateral) (R>L); Chronic upper back pain (Location of Tertiary source of pain); Generalized anxiety disorder; Depression; Lumbar spondylosis with radiculopathy; Epidural fibrosis; Failed back surgical syndrome; Encounter for therapeutic drug level monitoring; Long term prescription opiate use; Long term current use of opiate analgesic; Opiate use (30 MME/Day); Chronic low back pain (Location of Primary Source of Pain) (Bilateral) (R>L); Chronic sacroiliac joint pain (Right); Abnormal MRI, lumbar spine;  Abnormal CT myelogram of the thoracic spine; Thoracic disc herniation; Bulge of lumbar disc without myelopathy; Vertebral body hemangioma; Disturbance of skin sensation; Cervical IVDD (intervertebral disc displacement); Cervical foraminal stenosis (C5-6 and C7-T1: Right); Cervical central spinal stenosis (C5-6); Thoracic IVDD (intervertebral disc displacement); Thoracic facet arthropathy; Thoracic facet syndrome ([place]); Lumbar IVDD (intervertebral disc  displacement); Lumbar foraminal stenosis (Right L4-5); Lumbar central spinal stenosis (L3-4 and L4-5); Chronic knee pain (Right); Osteoarthritis of knee (Right); Chronic hip pain (Location of Secondary source of pain) (Right); Chronic shoulder pain (Location of Tertiary source of pain) (Bilateral) (Right); Vitamin B12 deficiency; and Erectile dysfunction on their problem list. His primarily concern today is the Back Pain (mid to lower)  Pain Assessment: Location: Mid, Lower, Upper Back Radiating: radiates down to lower buttock and sometime right hip Onset: More than a month ago Duration: Chronic pain Quality: Constant Severity: 3 /10 (subjective, self-reported pain score)  Note: Reported level is compatible with observation.                          Effect on ADL: unable to do prolong walking Timing: Constant Modifying factors: lay down. meds BP: (!) 133/105  HR: 70  Mr. Watling was last scheduled for an appointment on 02/08/2018 for medication management. During today's appointment we reviewed Mr. Vanvalkenburgh's chronic pain status, as well as his outpatient medication regimen. He admits that his pain is stable. He denies any changes or concerns today.   The patient  reports that he does not use drugs. His body mass index is 30.81 kg/m.  Further details on both, my assessment(s), as well as the proposed treatment plan, please see below.  Controlled Substance Pharmacotherapy Assessment REMS (Risk Evaluation and Mitigation Strategy)  Analgesic:Oxycodone IR 5 mg every 6 hours (20 mg/day) MME/day:30 mg/day.     Chauncey Fischer, RN  05/02/2018  9:34 AM  Sign at close encounter Nursing Pain Medication Assessment:  Safety precautions to be maintained throughout the outpatient stay will include: orient to surroundings, keep bed in low position, maintain call bell within reach at all times, provide assistance with transfer out of bed and ambulation.  Medication Inspection Compliance: Pill count  conducted  under aseptic conditions, in front of the patient. Neither the pills nor the bottle was removed from the patient's sight at any time. Once count was completed pills were immediately returned to the patient in their original bottle.  Medication: Oxycodone IR Pill/Patch Count: 56 of 130 pills remain Pill/Patch Appearance: Markings consistent with prescribed medication Bottle Appearance: Standard pharmacy container. Clearly labeled. Filled Date: 5 / 3 / 2019 Last Medication intake:  Today   Pharmacokinetics: Liberation and absorption (onset of action): WNL Distribution (time to peak effect): WNL Metabolism and excretion (duration of action): WNL         Pharmacodynamics: Desired effects: Analgesia: Mr. Westberg reports >50% benefit. Functional ability: Patient reports that medication allows him to accomplish basic ADLs Clinically meaningful improvement in function (CMIF): Sustained CMIF goals met Perceived effectiveness: Described as relatively effective, allowing for increase in activities of daily living (ADL) Undesirable effects: Side-effects or Adverse reactions: None reported Monitoring: [place] PMP: Online review of the past 2-monthperiod conducted. Compliant with practice rules and regulations Last UDS on record: Summary  Date Value Ref Range Status  11/15/2017 FINAL  Final    Comment:    ==================================================================== TOXASSURE SELECT 13 (MW) ==================================================================== Test                             Result       Flag       Units Drug Present and Declared for Prescription Verification   Oxycodone                      395          EXPECTED   ng/mg creat   Oxymorphone                    327          EXPECTED   ng/mg creat   Noroxycodone                   1004         EXPECTED   ng/mg creat   Noroxymorphone                 180          EXPECTED   ng/mg creat    Sources of oxycodone are  scheduled prescription medications.    Oxymorphone, noroxycodone, and noroxymorphone are expected    metabolites of oxycodone. Oxymorphone is also available as a    scheduled prescription medication. ==================================================================== Test                      Result    Flag   Units      Ref Range   Creatinine              181              mg/dL      >=20 ==================================================================== Declared Medications:  The flagging and interpretation on this report are based on the  following declared medications.  Unexpected results may arise from  inaccuracies in the declared medications.  **Note: The testing scope of this panel includes these medications:  Oxycodone  **Note: The testing scope of this panel does not include following  reported medications:  Cholecalciferol  Cyanocobalamin  Lisinopril  Magnesium Oxide ==================================================================== For clinical consultation, please call [phone] ====================================================================    UDS interpretation: Compliant  Medication Assessment Form: Reviewed. Patient indicates being compliant with therapy Treatment compliance: Compliant Risk Assessment Profile: Aberrant behavior: See prior evaluations. None observed or detected today Comorbid factors increasing risk of overdose: See prior notes. No additional risks detected today Risk of substance use disorder (SUD): Low  ORT Scoring interpretation table:  Score <3 = Low Risk for SUD  Score between 4-7 = Moderate Risk for SUD  Score >8 = High Risk for Opioid Abuse   Risk Mitigation Strategies:  Patient Counseling: Covered Patient-Prescriber Agreement (PPA): Present and active  Notification to other healthcare providers: Done  Pharmacologic Plan: No change in therapy, at this time.             Laboratory Chemistry  Inflammation  Markers (CRP: Acute Phase) (ESR: Chronic Phase) Lab Results  Component Value Date   CRP 0.8 06/09/2016   ESRSEDRATE 6 06/09/2016                         Rheumatology Markers No results found for: Elayne Guerin, [facility]                      Renal Function Markers Lab Results  Component Value Date   BUN 16 01/11/2018   CREATININE 0.86 01/11/2018   GFRAA >60 06/09/2016   GFRNONAA >60 06/09/2016                              Hepatic Function Markers Lab Results  Component Value Date   AST 11 02/01/2017   ALT 10 02/01/2017   ALBUMIN 4.2 02/01/2017   ALKPHOS 49 02/01/2017   LIPASE 153 09/21/2012                        Electrolytes Lab Results  Component Value Date   NA 138 01/11/2018   K 5.4 (H) 01/11/2018   CL 102 01/11/2018   CALCIUM 8.9 01/11/2018   MG 1.7 02/01/2017                        Neuropathy Markers Lab Results  Component Value Date   VITAMINB12 203 (L) 01/11/2018   HGBA1C 5.4 01/11/2018                        Bone Pathology Markers Lab Results  Component Value Date   VD25OH 24.95 (L) 01/11/2018   25OHVITD1 31 06/09/2016   25OHVITD2 6.6 06/09/2016   25OHVITD3 24 06/09/2016                         Coagulation Parameters Lab Results  Component Value Date   PLT 240 08/03/2014                        Cardiovascular Markers Lab Results  Component Value Date   CKTOTAL 46 07/10/2011   CKMB 1.4 07/10/2011   TROPONINI < 0.02 12/23/2011   HGB 14.9 08/03/2014   HCT 43.9 08/03/2014                         CA Markers No results found for: CEA, CA125, LABCA2                      Note: Lab  results reviewed.  Recent Diagnostic Imaging Results  DG Foot Complete Right  PRIOR REPORT IMPORTED FROM AN EXTERNAL SYSTEM    PRIOR REPORT IMPORTED FROM THE SYNGO WORKFLOW SYSTEM   REASON FOR EXAM:    pain, trauma  COMMENTS:   May transport without cardiac monitor   PROCEDURE:     DXR - DXR FOOT RT COMPLETE W/OBLIQUES  - Mar 24 2012   8:54AM   RESULT:     No fracture, dislocation or other acute bony abnormality is  identified. There is noted deformity of the base of the fifth metatarsal  compatible with residual change from a prior fracture that has healed.   IMPRESSION:  1.     No acute bony abnormalities are identified.   Thank you for the opportunity to contribute to the care of your patient.     Complexity Note: Imaging results reviewed. Results shared with Mr. Stingley, using Layman's terms.                         Meds   Current Outpatient Medications:  .  Cholecalciferol (VITAMIN D3) 2000 units capsule, Take 1 capsule (2,000 Units total) by mouth daily. (Patient taking differently: Take 4,000 Units by mouth daily. ), Disp: 90 capsule, Rfl: 0 .  lisinopril (PRINIVIL,ZESTRIL) 20 MG tablet, Take 1 tablet (20 mg total) by mouth daily., Disp: 90 tablet, Rfl: 1 .  [START ON 06/14/2018] oxyCODONE (OXY IR/ROXICODONE) 5 MG immediate release tablet, Take 1 tablet (5 mg total) by mouth every 6 (six) hours as needed for severe pain (May take one additional tab as needed)., Disp: 130 tablet, Rfl: 0 .  sildenafil (REVATIO) 20 MG tablet, Take 2-5 tablets (40-100 mg total) by mouth daily as needed (relations)., Disp: 30 tablet, Rfl: 0 .  vitamin B-12 (CYANOCOBALAMIN) 1000 MCG tablet, Take 1,000 mcg by mouth daily., Disp: , Rfl:  .  Magnesium Oxide 500 MG CAPS, Take 1 capsule (500 mg total) by mouth daily., Disp: 90 capsule, Rfl: 0 .  [START ON 07/14/2018] oxyCODONE (OXY IR/ROXICODONE) 5 MG immediate release tablet, Take 1 tablet (5 mg total) by mouth every 6 (six) hours as needed for severe pain (May take one additional tab as needed)., Disp: 130 tablet, Rfl: 0 .  [START ON 05/15/2018] oxyCODONE (OXY IR/ROXICODONE) 5 MG immediate release tablet, Take 1 tablet (5 mg total) by mouth every 6 (six) hours as needed for severe pain (May take one additional tab as needed)., Disp: 130 tablet, Rfl: 0  ROS  Constitutional: Denies any  fever or chills Gastrointestinal: No reported hemesis, hematochezia, vomiting, or acute GI distress Musculoskeletal: Denies any acute onset joint swelling, redness, loss of ROM, or weakness Neurological: No reported episodes of acute onset apraxia, aphasia, dysarthria, agnosia, amnesia, paralysis, loss of coordination, or loss of consciousness  Allergies  Mr. Cerveny is allergic to levaquin [levofloxacin in d5w].  [place]  Drug: Mr. Ducey  reports that he does not use drugs. Alcohol:  reports that he does not drink alcohol. Tobacco:  reports that he quit smoking about 10 years ago. He has never used smokeless tobacco. Medical:  has a past medical history of Bell's palsy (2015), Chronic pain syndrome, DDD (degenerative disc disease), lumbar, HTN (hypertension), and Morbid obesity ([place]) (08/16/2014). Surgical: Mr. Louque  has a past surgical history that includes Back surgery (2012) and Cholecystectomy. Family: family history includes COPD in his mother; Hypertension in his other.  Constitutional Exam  General appearance: Well  nourished, well developed, and well hydrated. In no apparent acute distress Vitals:   05/02/18 0914  BP: (!) 133/105  Pulse: 70  Temp: 98.2 F (36.8 C)  SpO2: 100%  Weight: 240 lb (108.9 kg)  Height: _0  (1.88 m)  Psych/Mental status: Alert, oriented x 3 (person, place, & time)       Eyes: PERLA Respiratory: No evidence of acute respiratory distress  Lumbar Spine Area Exam  Skin & Axial Inspection: Well healed scar from previous spine surgery detected Alignment: Symmetrical Functional ROM: Pain restricted ROM       Stability: No instability detected Muscle Tone/Strength: Functionally intact. No obvious neuro-muscular anomalies detected. Sensory (Neurological): Unimpaired Palpation: Tender       Provocative Tests: Lumbar Hyperextension and rotation test: Positive on the left for facet joint pain. Lumbar quadrant test (Kemp's test): deferred today        Lumbar Lateral bending test: Positive ipsilateral radicular pain, on the left. Positive for left-sided foraminal stenosis. Patrick's Maneuver: Unable to perform                   FABER test: deferred today                   Thigh-thrust test: deferred today                   S-I compression test: deferred today                   S-I distraction test: deferred today                    Gait & Posture Assessment  Ambulation: Unassisted Gait: Relatively normal for age and body habitus Posture: WNL   Lower Extremity Exam    Side: Right lower extremity  Side: Left lower extremity  Stability: No instability observed          Stability: No instability observed          Skin & Extremity Inspection: Skin color, temperature, and hair growth are WNL. No peripheral edema or cyanosis. No masses, redness, swelling, asymmetry, or associated skin lesions. No contractures.  Skin & Extremity Inspection: Skin color, temperature, and hair growth are WNL. No peripheral edema or cyanosis. No masses, redness, swelling, asymmetry, or associated skin lesions. No contractures.  Functional ROM: Unrestricted ROM                  Functional ROM: Unrestricted ROM                  Muscle Tone/Strength: Functionally intact. No obvious neuro-muscular anomalies detected.  Muscle Tone/Strength: Functionally intact. No obvious neuro-muscular anomalies detected.  Sensory (Neurological): Unimpaired  Sensory (Neurological): Unimpaired  Palpation: No palpable anomalies  Palpation: No palpable anomalies   Assessment  Primary Diagnosis & Pertinent Problem List: The primary encounter diagnosis was Lumbar spondylosis with radiculopathy. Diagnoses of Lumbar DDD (degenerative disc disease), Chronic sacroiliac joint pain (Right), Chronic pain syndrome, Vitamin D deficiency, Low magnesium levels, and Long term prescription opiate use were also pertinent to this visit.  Status Diagnosis  Controlled Controlled Controlled 1. Lumbar  spondylosis with radiculopathy   2. Lumbar DDD (degenerative disc disease)   3. Chronic sacroiliac joint pain (Right)   4. Chronic pain syndrome   5. Vitamin D deficiency   6. Low magnesium levels   7. Long term prescription opiate use     Problems updated and reviewed during this visit:  No problems updated. Plan of Care  Pharmacotherapy (Medications Ordered): Meds ordered this encounter  Medications  . oxyCODONE (OXY IR/ROXICODONE) 5 MG immediate release tablet    Sig: Take 1 tablet (5 mg total) by mouth every 6 (six) hours as needed for severe pain (May take one additional tab as needed).    Dispense:  130 tablet    Refill:  0    Do not place this medication, or any other prescription from our practice, on "Automatic Refill". Patient may have prescription filled one day early if pharmacy is closed on scheduled refill date. Do not fill until: 07/14/2018 To last until:08/13/2018    Order Specific Question:   Supervising Provider    Answer:   Milinda Pointer [phone]  . oxyCODONE (OXY IR/ROXICODONE) 5 MG immediate release tablet    Sig: Take 1 tablet (5 mg total) by mouth every 6 (six) hours as needed for severe pain (May take one additional tab as needed).    Dispense:  130 tablet    Refill:  0    Do not place this medication, or any other prescription from our practice, on "Automatic Refill". Patient may have prescription filled one day early if pharmacy is closed on scheduled refill date. Do not fill until: 06/14/2018 To last until: 07/14/2018    Order Specific Question:   Supervising Provider    Answer:   Milinda Pointer [phone]  . oxyCODONE (OXY IR/ROXICODONE) 5 MG immediate release tablet    Sig: Take 1 tablet (5 mg total) by mouth every 6 (six) hours as needed for severe pain (May take one additional tab as needed).    Dispense:  130 tablet    Refill:  0    Do not place this medication, or any other prescription from our practice, on "Automatic Refill". Patient may have  prescription filled one day early if pharmacy is closed on scheduled refill date. Do not fill until: 05/15/2018 To last until:06/14/2018    Order Specific Question:   Supervising Provider    Answer:   Milinda Pointer [[id number]]   New Prescriptions   No medications on file   Medications administered today: Natale Lay had no medications administered during this visit. Lab-work, procedure(s), and/or referral(s): Orders Placed This Encounter  Procedures  . ToxASSURE Select 13 (MW), Urine   Imaging and/or referral(s): None Interventional therapies: Planned, scheduled, and/or pending: Not at this time. Instructed pt to monitor BP and take medications    Considering:  Diagnostic right-sided cervical epidural steroid injection  Diagnostic thoracic epidural steroid injection  Diagnostic bilateral thoracic facet block  Possible bilateral thoracic facet radiofrequency ablation  Diagnostic bilateral lumbar facet block  Possible bilateral lumbar facet radiofrequency ablation  Diagnostic right sacroiliac joint block  Possible right sacroiliac joint radiofrequency ablation  Diagnostic caudal epidural steroid injection and epidurogram  Diagnostic right-sided L4-5 transforaminal epidural steroid injection  Diagnostic right intra-articular knee injection  Diagnostic right genicular nerve block  Possible right genicular radiofrequency ablation    Palliative PRN treatment(s):  Palliativeright-sided cervical epidural steroid injection  Palliativethoracic epidural steroid injection  Palliativebilateral thoracic facet block  Palliativebilateral lumbar facet block  Palliativeright sacroiliac joint block  Palliativecaudal epidural steroid injection and epidurogram  Palliativeright-sided L4-5 transforaminal epidural steroid injection  Palliativeright intra-articular knee injection  Palliativeright genicular nerve block      Provider-requested follow-up: Return in about  3 months (around 08/02/2018) for MedMgmt with Me Donella Stade Edison Pace).  Future Appointments  Date Time Provider [place]  07/06/2018 12:30 PM  Eustace Pen, LPN [facility]  [id number]/[id number]  3:00 PM Ria Bush, MD [facility]  08/02/2018  9:00 AM Vevelyn Francois, NP [facility] None   Primary Care Physician: Ria Bush, MD Location: [facility] Outpatient [facility] Management [facility] Note by: Vevelyn Francois NP Date: 05/02/2018; Time: 11:09 AM  Pain Score Disclaimer: We use the NRS-11 scale. This is a self-reported, subjective measurement of pain severity with only modest accuracy. It is used primarily to identify changes within a particular patient. It must be understood that outpatient pain scales are significantly less accurate that those used for research, where they can be applied under ideal controlled circumstances with minimal exposure to variables. In reality, the score is likely to be a combination of pain intensity and pain affect, where pain affect describes the degree of emotional arousal or changes in action readiness caused by the sensory experience of pain. Factors such as social and work situation, setting, emotional state, anxiety levels, expectation, and prior pain experience may influence pain perception and show large inter-individual differences that may also be affected by time variables.  Patient instructions provided during this appointment: Patient Instructions   ____________________________________________________________________________________________  Medication Rules  Applies to: All patients receiving prescriptions (written or electronic).  Pharmacy of record: Pharmacy where electronic prescriptions will be sent. If written prescriptions are taken to a different pharmacy, please inform the nursing staff. The pharmacy listed in the electronic medical record should be the one where you would like electronic prescriptions to be sent.  Prescription refills:  Only during scheduled appointments. Applies to both, written and electronic prescriptions.  NOTE: The following applies primarily to controlled substances (Opioid* Pain Medications).   Patient's responsibilities: 1. Pain Pills: Bring all pain pills to every appointment (except for procedure appointments). 2. Pill Bottles: Bring pills in original pharmacy bottle. Always bring newest bottle. Bring bottle, even if empty. 3. Medication refills: You are responsible for knowing and keeping track of what medications you need refilled. The day before your appointment, write a list of all prescriptions that need to be refilled. Bring that list to your appointment and give it to the admitting nurse. Prescriptions will be written only during appointments. If you forget a medication, it will not be "Called in", "Faxed", or "electronically sent". You will need to get another appointment to get these prescribed. 4. Prescription Accuracy: You are responsible for carefully inspecting your prescriptions before leaving our office. Have the discharge nurse carefully go over each prescription with you, before taking them home. Make sure that your name is accurately spelled, that your address is correct. Check the name and dose of your medication to make sure it is accurate. Check the number of pills, and the written instructions to make sure they are clear and accurate. Make sure that you are given enough medication to last until your next medication refill appointment. 5. Taking Medication: Take medication as prescribed. Never take more pills than instructed. Never take medication more frequently than prescribed. Taking less pills or less frequently is permitted and encouraged, when it comes to controlled substances (written prescriptions).  6. Inform other Doctors: Always inform, all of your healthcare providers, of all the medications you take. 7. Pain Medication from other Providers: You are not allowed to accept any  additional pain medication from any other Doctor or Healthcare provider. There are two exceptions to this rule. (see below) In the event that you require additional pain medication, you are responsible for notifying us, as stated below. 8. Medication Agreement:  You are responsible for carefully reading and following our Medication Agreement. This must be signed before receiving any prescriptions from our practice. Safely store a copy of your signed Agreement. Violations to the Agreement will result in no further prescriptions. (Additional copies of our Medication Agreement are available upon request.) 9. Laws, Rules, & Regulations: All patients are expected to follow all Federal and [employer], Rules, [employer]. Ignorance of the Laws does not constitute a valid excuse. The use of any illegal substances is prohibited. 10. Adopted CDC guidelines & recommendations: Target dosing levels will be at or below 60 MME/day. Use of benzodiazepines** is not recommended.  Exceptions: There are only two exceptions to the rule of not receiving pain medications from other Healthcare Providers. 1. Exception #1 (Emergencies): In the event of an emergency (i.e.: accident requiring emergency care), you are allowed to receive additional pain medication. However, you are responsible for: As soon as you are able, call our office [phone], at any time of the day or night, and leave a message stating your name, the date and nature of the emergency, and the name and dose of the medication prescribed. In the event that your call is answered by a member of our staff, make sure to document and save the date, time, and the name of the person that took your information.  2. Exception #2 (Planned Surgery): In the event that you are scheduled by another doctor or dentist to have any type of surgery or procedure, you are allowed (for a period no longer than 30 days), to receive additional pain medication, for the acute  post-op pain. However, in this case, you are responsible for picking up a copy of our "Post-op Pain Management for Surgeons" handout, and giving it to your surgeon or dentist. This document is available at our office, and does not require an appointment to obtain it. Simply go to our office during business hours (Monday-Thursday from 8:00 AM to 4:00 PM) (Friday 8:00 AM to 12:00 Noon) or if you have a scheduled appointment with [place], prior to your surgery, and ask for it by name. In addition, you will need to provide [place] with your name, name of your surgeon, type of surgery, and date of procedure or surgery.  *Opioid medications include: morphine, codeine, oxycodone, oxymorphone, hydrocodone, hydromorphone, meperidine, tramadol, tapentadol, buprenorphine, fentanyl, methadone. **Benzodiazepine medications include: diazepam (Valium), alprazolam (Xanax), clonazepam (Klonopine), lorazepam (Ativan), clorazepate (Tranxene), chlordiazepoxide (Librium), estazolam (Prosom), oxazepam (Serax), temazepam (Restoril), triazolam (Halcion) (Last updated: 02/10/2018) ____________________________________________________________________________________________    BMI Assessment: Estimated body mass index is 30.81 kg/m as calculated from the following:   Height as of this encounter: _0  (1.88 m).   Weight as of this encounter: 240 lb (108.9 kg).  BMI interpretation table: BMI level Category Range association with higher incidence of chronic pain  <18 kg/m2 Underweight   18.5-24.9 kg/m2 Ideal body weight   25-29.9 kg/m2 Overweight Increased incidence by 20%  30-34.9 kg/m2 Obese (Class I) Increased incidence by 68%  35-39.9 kg/m2 Severe obesity (Class II) Increased incidence by 136%  >40 kg/m2 Extreme obesity (Class III) Increased incidence by 254%   Patient's current BMI Ideal Body weight  Body mass index is 30.81 kg/m. Ideal body weight: 82.2 kg (181 lb 3.5 oz) Adjusted ideal body weight: 92.9 kg (204 lb 11.7  oz)   BMI Readings from Last 4 Encounters:  05/02/18 30.81 kg/m  02/08/18 32.45 kg/m  01/11/18 32.45 kg/m  11/15/17 32.10 kg/m   Wt Readings  from Last 4 Encounters:  05/02/18 240 lb (108.9 kg)  01/11/18 252 lb 12 oz (114.6 kg)  11/15/17 250 lb (113.4 kg)  11/09/17 250 lb (113.4 kg)

## 2018-05-02 NOTE — Progress Notes (Signed)
Nursing Pain Medication Assessment:  Safety precautions to be maintained throughout the outpatient stay will include: orient to surroundings, keep bed in low position, maintain call bell within reach at all times, provide assistance with transfer out of bed and ambulation.  Medication Inspection Compliance: Pill count conducted under aseptic conditions, in front of the patient. Neither the pills nor the bottle was removed from the patient's sight at any time. Once count was completed pills were immediately returned to the patient in their original bottle.  Medication: Oxycodone IR Pill/Patch Count: 56 of 130 pills remain Pill/Patch Appearance: Markings consistent with prescribed medication Bottle Appearance: Standard pharmacy container. Clearly labeled. Filled Date: 5 / 3 / 2019 Last Medication intake:  Today

## 2018-05-02 NOTE — Patient Instructions (Addendum)
____________________________________________________________________________________________  Medication Rules  Applies to: All patients receiving prescriptions (written or electronic).  Pharmacy of record: Pharmacy where electronic prescriptions will be sent. If written prescriptions are taken to a different pharmacy, please inform the nursing staff. The pharmacy listed in the electronic medical record should be the one where you would like electronic prescriptions to be sent.  Prescription refills: Only during scheduled appointments. Applies to both, written and electronic prescriptions.  NOTE: The following applies primarily to controlled substances (Opioid* Pain Medications).   Patient's responsibilities: 1. Pain Pills: Bring all pain pills to every appointment (except for procedure appointments). 2. Pill Bottles: Bring pills in original pharmacy bottle. Always bring newest bottle. Bring bottle, even if empty. 3. Medication refills: You are responsible for knowing and keeping track of what medications you need refilled. The day before your appointment, write a list of all prescriptions that need to be refilled. Bring that list to your appointment and give it to the admitting nurse. Prescriptions will be written only during appointments. If you forget a medication, it will not be "Called in", "Faxed", or "electronically sent". You will need to get another appointment to get these prescribed. 4. Prescription Accuracy: You are responsible for carefully inspecting your prescriptions before leaving our office. Have the discharge nurse carefully go over each prescription with you, before taking them home. Make sure that your name is accurately spelled, that your address is correct. Check the name and dose of your medication to make sure it is accurate. Check the number of pills, and the written instructions to make sure they are clear and accurate. Make sure that you are given enough medication to last  until your next medication refill appointment. 5. Taking Medication: Take medication as prescribed. Never take more pills than instructed. Never take medication more frequently than prescribed. Taking less pills or less frequently is permitted and encouraged, when it comes to controlled substances (written prescriptions).  6. Inform other Doctors: Always inform, all of your healthcare providers, of all the medications you take. 7. Pain Medication from other Providers: You are not allowed to accept any additional pain medication from any other Doctor or Healthcare provider. There are two exceptions to this rule. (see below) In the event that you require additional pain medication, you are responsible for notifying us, as stated below. 8. Medication Agreement: You are responsible for carefully reading and following our Medication Agreement. This must be signed before receiving any prescriptions from our practice. Safely store a copy of your signed Agreement. Violations to the Agreement will result in no further prescriptions. (Additional copies of our Medication Agreement are available upon request.) 9. Laws, Rules, & Regulations: All patients are expected to follow all Federal and State Laws, Statutes, Rules, & Regulations. Ignorance of the Laws does not constitute a valid excuse. The use of any illegal substances is prohibited. 10. Adopted CDC guidelines & recommendations: Target dosing levels will be at or below 60 MME/day. Use of benzodiazepines** is not recommended.  Exceptions: There are only two exceptions to the rule of not receiving pain medications from other Healthcare Providers. 1. Exception #1 (Emergencies): In the event of an emergency (i.e.: accident requiring emergency care), you are allowed to receive additional pain medication. However, you are responsible for: As soon as you are able, call our office [phone], at any time of the day or night, and leave a message stating your name, the  date and nature of the emergency, and the name and dose of the medication   prescribed. In the event that your call is answered by a member of our staff, make sure to document and save the date, time, and the name of the person that took your information.  2. Exception #2 (Planned Surgery): In the event that you are scheduled by another doctor or dentist to have any type of surgery or procedure, you are allowed (for a period no longer than 30 days), to receive additional pain medication, for the acute post-op pain. However, in this case, you are responsible for picking up a copy of our "Post-op Pain Management for Surgeons" handout, and giving it to your surgeon or dentist. This document is available at our office, and does not require an appointment to obtain it. Simply go to our office during business hours (Monday-Thursday from 8:00 AM to 4:00 PM) (Friday 8:00 AM to 12:00 Noon) or if you have a scheduled appointment with [place], prior to your surgery, and ask for it by name. In addition, you will need to provide [place] with your name, name of your surgeon, type of surgery, and date of procedure or surgery.  *Opioid medications include: morphine, codeine, oxycodone, oxymorphone, hydrocodone, hydromorphone, meperidine, tramadol, tapentadol, buprenorphine, fentanyl, methadone. **Benzodiazepine medications include: diazepam (Valium), alprazolam (Xanax), clonazepam (Klonopine), lorazepam (Ativan), clorazepate (Tranxene), chlordiazepoxide (Librium), estazolam (Prosom), oxazepam (Serax), temazepam (Restoril), triazolam (Halcion) (Last updated: 02/10/2018) ____________________________________________________________________________________________    BMI Assessment: Estimated body mass index is 30.81 kg/m as calculated from the following:   Height as of this encounter: 6\' 2"  (1.88 m).   Weight as of this encounter: 240 lb (108.9 kg).  BMI interpretation table: BMI level Category Range association with higher  incidence of chronic pain  <18 kg/m2 Underweight   18.5-24.9 kg/m2 Ideal body weight   25-29.9 kg/m2 Overweight Increased incidence by 20%  30-34.9 kg/m2 Obese (Class I) Increased incidence by 68%  35-39.9 kg/m2 Severe obesity (Class II) Increased incidence by 136%  >40 kg/m2 Extreme obesity (Class III) Increased incidence by 254%   Patient's current BMI Ideal Body weight  Body mass index is 30.81 kg/m. Ideal body weight: 82.2 kg (181 lb 3.5 oz) Adjusted ideal body weight: 92.9 kg (204 lb 11.7 oz)   BMI Readings from Last 4 Encounters:  05/02/18 30.81 kg/m  02/08/18 32.45 kg/m  01/11/18 32.45 kg/m  11/15/17 32.10 kg/m   Wt Readings from Last 4 Encounters:  05/02/18 240 lb (108.9 kg)  01/11/18 252 lb 12 oz (114.6 kg)  11/15/17 250 lb (113.4 kg)  11/09/17 250 lb (113.4 kg)

## 2018-05-08 LAB — TOXASSURE SELECT 13 (MW), URINE

## 2018-07-04 ENCOUNTER — Other Ambulatory Visit: Payer: Self-pay | Admitting: Family Medicine

## 2018-07-04 DIAGNOSIS — R790 Abnormal level of blood mineral: Secondary | ICD-10-CM

## 2018-07-04 DIAGNOSIS — E538 Deficiency of other specified B group vitamins: Secondary | ICD-10-CM

## 2018-07-04 DIAGNOSIS — E559 Vitamin D deficiency, unspecified: Secondary | ICD-10-CM

## 2018-07-04 DIAGNOSIS — I10 Essential (primary) hypertension: Secondary | ICD-10-CM

## 2018-07-04 DIAGNOSIS — E119 Type 2 diabetes mellitus without complications: Secondary | ICD-10-CM

## 2018-07-06 ENCOUNTER — Ambulatory Visit: Payer: Medicare Other

## 2018-07-09 ENCOUNTER — Encounter (HOSPITAL_COMMUNITY): Payer: Self-pay | Admitting: Nurse Practitioner

## 2018-07-09 ENCOUNTER — Emergency Department (HOSPITAL_COMMUNITY)
Admission: EM | Admit: 2018-07-09 | Discharge: 2018-07-10 | Disposition: A | Discharge status: Home / Self Care | Payer: Medicare Other | Attending: Emergency Medicine | Admitting: Emergency Medicine

## 2018-07-09 DIAGNOSIS — Z5321 Procedure and treatment not carried out due to patient leaving prior to being seen by health care provider: Secondary | ICD-10-CM | POA: Diagnosis not present

## 2018-07-09 DIAGNOSIS — R040 Epistaxis: Secondary | ICD-10-CM | POA: Diagnosis not present

## 2018-07-09 NOTE — ED Triage Notes (Signed)
Pt presents with a moderately controlled nose bleed. States onset was about 30 mins ago PTA.

## 2018-07-11 NOTE — ED Notes (Signed)
Follow up call made  Has appointment w his md tomorrow  07/11/18  s Zyona Pettaway rn

## 2018-07-12 ENCOUNTER — Ambulatory Visit (INDEPENDENT_AMBULATORY_CARE_PROVIDER_SITE_OTHER): Payer: Medicare Other

## 2018-07-12 ENCOUNTER — Ambulatory Visit (INDEPENDENT_AMBULATORY_CARE_PROVIDER_SITE_OTHER): Payer: Medicare Other | Admitting: Family Medicine

## 2018-07-12 ENCOUNTER — Encounter: Payer: Self-pay | Admitting: Family Medicine

## 2018-07-12 VITALS — BP 124/90 | HR 80 | Temp 98.0°F | Ht 73.0 in | Wt 267.5 lb

## 2018-07-12 DIAGNOSIS — N529 Male erectile dysfunction, unspecified: Secondary | ICD-10-CM | POA: Diagnosis not present

## 2018-07-12 DIAGNOSIS — J3489 Other specified disorders of nose and nasal sinuses: Secondary | ICD-10-CM | POA: Diagnosis not present

## 2018-07-12 DIAGNOSIS — Z0000 Encounter for general adult medical examination without abnormal findings: Secondary | ICD-10-CM

## 2018-07-12 DIAGNOSIS — E538 Deficiency of other specified B group vitamins: Secondary | ICD-10-CM

## 2018-07-12 DIAGNOSIS — Z7189 Other specified counseling: Secondary | ICD-10-CM | POA: Insufficient documentation

## 2018-07-12 DIAGNOSIS — E559 Vitamin D deficiency, unspecified: Secondary | ICD-10-CM

## 2018-07-12 DIAGNOSIS — E119 Type 2 diabetes mellitus without complications: Secondary | ICD-10-CM

## 2018-07-12 DIAGNOSIS — I10 Essential (primary) hypertension: Secondary | ICD-10-CM

## 2018-07-12 DIAGNOSIS — R790 Abnormal level of blood mineral: Secondary | ICD-10-CM | POA: Diagnosis not present

## 2018-07-12 DIAGNOSIS — Z6835 Body mass index (BMI) 35.0-35.9, adult: Secondary | ICD-10-CM

## 2018-07-12 LAB — MAGNESIUM: Magnesium: 1.9 mg/dL (ref 1.5–2.5)

## 2018-07-12 LAB — BASIC METABOLIC PANEL
BUN: 16 mg/dL (ref 6–23)
CHLORIDE: 101 meq/L (ref 96–112)
CO2: 28 mEq/L (ref 19–32)
Calcium: 9.4 mg/dL (ref 8.4–10.5)
Creatinine, Ser: 0.83 mg/dL (ref 0.40–1.50)
GFR: 107.55 mL/min (ref >60.00)
Glucose, Bld: 100 mg/dL — ABNORMAL HIGH (ref 70–99)
POTASSIUM: 4.3 meq/L (ref 3.5–5.1)
Sodium: 137 mEq/L (ref 135–145)

## 2018-07-12 LAB — LIPID PANEL
Cholesterol: 153 mg/dL (ref 0–200)
HDL: 44.4 mg/dL (ref >39.00)
LDL Cholesterol: 96 mg/dL (ref 0–99)
NonHDL: 108.23
Total CHOL/HDL Ratio: 3
Triglycerides: 63 mg/dL (ref 0.0–149.0)
VLDL: 12.6 mg/dL (ref 0.0–40.0)

## 2018-07-12 LAB — VITAMIN B12: Vitamin B-12: 736 pg/mL (ref 211–911)

## 2018-07-12 LAB — HEMOGLOBIN A1C: Hgb A1c MFr Bld: 5.3 % (ref 4.6–6.5)

## 2018-07-12 LAB — VITAMIN D 25 HYDROXY (VIT D DEFICIENCY, FRACTURES): VITD: 43.98 ng/mL (ref 30.00–100.00)

## 2018-07-12 MED ORDER — MUPIROCIN 2 % EX OINT: 1.0000 "application " | TOPICAL_OINTMENT | Freq: Two times a day (BID) | CUTANEOUS | 0 refills | Status: DC

## 2018-07-12 MED ORDER — LISINOPRIL 20 MG PO TABS: 40.0000 mg | ORAL_TABLET | Freq: Every day | ORAL | 3 refills | Status: DC

## 2018-07-12 NOTE — Assessment & Plan Note (Signed)
Encouraged ongoing healthy diet and lifestyle. Weight gain noted, reviewed with patient.

## 2018-07-12 NOTE — Progress Notes (Signed)
BP 124/90 (BP Location: Left Arm, Patient Position: Sitting, Cuff Size: Large)   Pulse 80   Temp 98 F (36.7 C) (Oral)   Ht 6\' 1"  (1.854 m) Comment: no shoes  Wt 267 lb 8 oz (121.3 kg)   SpO2 96%   BMI 35.29 kg/m    CC: AMW f/u visit Subjective:    Patient ID: Larry Goodwin, male    DOB: January 30, 1976, 42 y.o.   MRN: [id number]  HPI: Larry Goodwin is a 42 y.o. male presenting on 07/12/2018 for Annual Exam (Part 2)   Saw Katha Cabal today for medicare wellness visit. Note will be reviewed.    Labs pending today.   ED has resolved - likely psychological. Did not need many sildenafil.   HTN - Compliant with current antihypertensive regimen of lisinopril 40mg . Does check blood pressures at home: well controlled. No low blood pressure readings or symptoms of dizziness/syncope. Denies HA, vision changes, CP/tightness, SOB, leg swelling.    DM - does not regularly check sugars. Compliant with antihyperglycemic regimen which includes: diet controlled. Working on low carb diet. Denies low sugars or hypoglycemic symptoms. Denies paresthesias. Last diabetic eye exam DUE. Pneumovax: DUE. Prevnar: not due. Glucometer brand: does not check sugars. DSME: has not completed. Lab Results  Component Value Date   HGBA1C 5.4 01/11/2018   Diabetic Foot Exam - Simple   No data filed     No results found for: Derl Barrow   Preventative: Flu shot - declines Advanced planning - full code. Would want Chaya Jan (fiance) to be HCPOA.  Seat belt and sunscreen use discussed. No changing moles on skin.  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  Interim medical history since our last visit reviewed. Allergies and medications reviewed and updated. Outpatient Medications Prior to Visit  Medication Sig Dispense Refill  . Cholecalciferol (VITAMIN D3) 2000 units capsule Take 1 capsule (2,000 Units total) by mouth daily. (Patient taking differently: Take 4,000 Units by mouth daily. ) 90 capsule 0  . [START ON 07/14/2018] oxyCODONE (OXY IR/ROXICODONE) 5 MG immediate release tablet Take 1 tablet (5 mg total) by mouth every 6 (six) hours as needed for severe pain (May take one additional tab as needed). 130 tablet 0  . oxyCODONE (OXY IR/ROXICODONE) 5 MG immediate release tablet Take 1 tablet (5 mg total) by mouth every 6 (six) hours as needed for severe pain (May take one additional tab as needed). 130 tablet 0  . vitamin B-12 (CYANOCOBALAMIN) 1000 MCG tablet Take 1,000 mcg by mouth daily.    Marland Kitchen lisinopril (PRINIVIL,ZESTRIL) 20 MG tablet Take 1 tablet (20 mg total) by mouth daily. (Patient taking differently: Take 40 mg by mouth daily. ) 90 tablet 1  . sildenafil (REVATIO) 20 MG tablet Take 2-5 tablets (40-100 mg total) by mouth daily as needed (relations). 30 tablet 0  . Magnesium Oxide 500 MG CAPS Take 1 capsule (500 mg total) by mouth daily. 90 capsule 0  . oxyCODONE (OXY IR/ROXICODONE) 5 MG immediate release tablet Take 1 tablet (5 mg total) by mouth every 6 (six) hours as needed for severe pain (May take one additional tab as needed).  130 tablet 0   No facility-administered medications prior to visit.      Per HPI unless specifically indicated in ROS section below Review of Systems     Objective:    BP 124/90 (BP Location: Left Arm, Patient Position: Sitting, Cuff Size: Large)   Pulse 80   Temp 98 F (36.7 C) (Oral)   Ht 6\' 1"  (1.854 m) Comment: no shoes  Wt 267 lb 8 oz (121.3 kg)   SpO2 96%   BMI 35.29 kg/m   Wt Readings from Last 3 Encounters:  07/12/18 267 lb 8 oz (121.3 kg)  07/12/18 267 lb 8 oz (121.3 kg)  07/09/18 240 lb  (108.9 kg)    Physical Exam  Constitutional: He is oriented to person, place, and time. He appears well-developed and well-nourished. No distress.  HENT:  Head: Normocephalic and atraumatic.  Right Ear: Hearing, tympanic membrane, external ear and ear canal normal.  Left Ear: Hearing, tympanic membrane, external ear and ear canal normal.  Nose: Mucosal edema (L sided mucosal erythema/congestion) present. No rhinorrhea.    Mouth/Throat: Uvula is midline, oropharynx is clear and moist and mucous membranes are normal. No oropharyngeal exudate, posterior oropharyngeal edema, posterior oropharyngeal erythema or tonsillar abscesses.  Right nasal entrance with scab and purulent mucous  Eyes: Pupils are equal, round, and reactive to light. Conjunctivae and EOM are normal. No scleral icterus.  Neck: Normal range of motion. Neck supple.  Cardiovascular: Normal rate, regular rhythm, normal heart sounds and intact distal pulses.  No murmur heard. Pulses:      Radial pulses are 2+ on the right side, and 2+ on the left side.  Pulmonary/Chest: Effort normal and breath sounds normal. No respiratory distress. He has no wheezes. He has no rales.  Abdominal: Soft. Bowel sounds are normal. He exhibits no distension and no mass. There is no tenderness. There is no rebound and no guarding.  Musculoskeletal: Normal range of motion. He exhibits no edema.  Lymphadenopathy:    He has no cervical adenopathy.  Neurological: He is alert and oriented to person, place, and time.  CN grossly intact, station and gait intact  Skin: Skin is warm and dry. No rash noted.  Psychiatric: He has a normal mood and affect. His behavior is normal. Judgment and thought content normal.  Nursing note and vitals reviewed.  Results for orders placed or performed in visit on 05/02/18  ToxASSURE Select 13 (MW), Urine  Result Value Ref Range   Summary FINAL       Assessment & Plan:   Problem List Items Addressed This Visit     Vitamin D deficiency    Reports compliance with 4000 IU daily. Lab results pending      Vitamin B12 deficiency    Compliant with 1000 mcg daily. Lab results pending      Type 2 diabetes, diet controlled ([place])    Actually improved on latest check. Updated A1c pending.       Relevant Medications   lisinopril (PRINIVIL,ZESTRIL) 20 MG tablet   Severe obesity (BMI 35.0-35.9 with comorbidity) ([place])    Encouraged ongoing healthy diet and lifestyle. Weight gain noted, reviewed with patient.       Internal nasal lesion    Rx mupirocin. rec against removing scab. Update if not improving with treatment.       HTN (hypertension) - Primary    Chronic, stable. Continue current regimen.       Relevant Medications   lisinopril (PRINIVIL,ZESTRIL) 20 MG tablet  RESOLVED: Erectile dysfunction    This has completely resolved pointing to psychological cause. Will resolve.       Advanced care planning/counseling discussion    Advanced planning - full code. Would want Chaya Jan (fiance) to be HCPOA.           Meds ordered this encounter  Medications  . lisinopril (PRINIVIL,ZESTRIL) 20 MG tablet    Sig: Take 2 tablets (40 mg total) by mouth daily.    Dispense:  90 tablet    Refill:  3  . mupirocin ointment (BACTROBAN) 2 %    Sig: Place 1 application into the nose 2 (two) times daily.    Dispense:  22 g    Refill:  0   No orders of the defined types were placed in this encounter.   Follow up plan: Return if symptoms worsen or fail to improve.  Ria Bush, MD

## 2018-07-12 NOTE — Assessment & Plan Note (Signed)
Actually improved on latest check. Updated A1c pending.

## 2018-07-12 NOTE — Progress Notes (Signed)
Subjective:   Larry Goodwin is a 42 y.o. male who presents for Medicare Annual/Subsequent preventive examination.  Review of Systems:  N/A Cardiac Risk Factors include: male gender;diabetes mellitus;hypertension;obesity (BMI >30kg/m2)     Objective:    Vitals: BP 124/90 (BP Location: Left Arm, Patient Position: Sitting, Cuff Size: Large)   Pulse 80   Temp 98 F (36.7 C) (Oral)   Ht 6\' 1"  (1.854 m) Comment: no shoes  Wt 267 lb 8 oz (121.3 kg)   SpO2 96%   BMI 35.29 kg/m   Body mass index is 35.29 kg/m.  Advanced Directives 07/12/2018 07/09/2018 05/02/2018 11/09/2017 08/17/2017 05/17/2017 02/18/2017  Does Patient Have a Medical Advance Directive? No No No No No No No  Would patient like information on creating a medical advance directive? No - Patient declined No - Patient declined No - Patient declined - - No - Patient declined -    Tobacco Social History   Tobacco Use  Smoking Status Former Smoker  . Last attempt to quit: 12/15/2007  . Years since quitting: 10.5  Smokeless Tobacco Never Used  Tobacco Comment   quit 6 years ago     Counseling given: No Comment: quit 6 years ago   Clinical Intake:  Pre-visit preparation completed: Yes  Pain Score: 6      Nutritional Status: BMI > 30  Obese Nutritional Risks: None Diabetes: No  How often do you need to have someone help you when you read instructions, pamphlets, or other written materials from your doctor or pharmacy?: 1 - Never What is the last grade level you completed in school?: 12th grade  Interpreter Needed?: No  Comments: pt lives with fiance and children Information entered by :: LPinson, LPN  Past Medical History:  Diagnosis Date  . Bell's palsy 2015   right then left sides  . Chronic pain syndrome    s/p 2 back surgeries Larry Goodwin)  . DDD (degenerative disc disease), lumbar    severe s/p 2 surgeries  . HTN (hypertension)   . Morbid obesity (Larry Goodwin) 08/16/2014   Past Surgical History:  Procedure  Laterality Date  . BACK SURGERY  2012   x 2 Larry Goodwin)  . CHOLECYSTECTOMY     Family History  Problem Relation Age of Onset  . COPD Mother        smoker (deceased at 34)  . Hypertension Other   . Stroke Neg Hx   . Cancer Neg Hx   . Diabetes Neg Hx   . CAD Neg Hx    Social History   Socioeconomic History  . Marital status: Married    Spouse name: Not on file  . Number of children: Not on file  . Years of education: Not on file  . Highest education level: Not on file  Occupational History  . Not on file  Social Needs  . Financial resource strain: Not on file  . Food insecurity:    Worry: Not on file    Inability: Not on file  . Transportation needs:    Medical: Not on file    Non-medical: Not on file  Tobacco Use  . Smoking status: Former Smoker    Last attempt to quit: 12/15/2007    Years since quitting: 10.5  . Smokeless tobacco: Never Used  . Tobacco comment: quit 6 years ago  Substance and Sexual Activity  . Alcohol use: No    Alcohol/week: 0.0 oz  . Drug use: No  . Sexual activity: Not on  file  Lifestyle  . Physical activity:    Days per week: Not on file    Minutes per session: Not on file  . Stress: Not on file  Relationships  . Social connections:    Talks on phone: Not on file    Gets together: Not on file    Attends religious service: Not on file    Active member of club or organization: Not on file    Attends meetings of clubs or organizations: Not on file    Relationship status: Not on file  Other Topics Concern  . 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 Cholecalciferol (VITAMIN D3) 2000 units capsule Take 1 capsule (2,000 Units total) by mouth daily. (Patient taking differently: Take  4,000 Units by mouth daily. )  . lisinopril (PRINIVIL,ZESTRIL) 20 MG tablet Take 1 tablet (20 mg total) by mouth daily. (Patient taking differently: Take 40 mg by mouth daily. )  . [START ON 07/14/2018] oxyCODONE (OXY IR/ROXICODONE) 5 MG immediate release tablet Take 1 tablet (5 mg total) by mouth every 6 (six) hours as needed for severe pain (May take one additional tab as needed).  Marland Kitchen oxyCODONE (OXY IR/ROXICODONE) 5 MG immediate release tablet Take 1 tablet (5 mg total) by mouth every 6 (six) hours as needed for severe pain (May take one additional tab as needed).  . sildenafil (REVATIO) 20 MG tablet Take 2-5 tablets (40-100 mg total) by mouth daily as needed (relations).  . vitamin B-12 (CYANOCOBALAMIN) 1000 MCG tablet Take 1,000 mcg by mouth daily.  . Magnesium Oxide 500 MG CAPS Take 1 capsule (500 mg total) by mouth daily.  Marland Kitchen oxyCODONE (OXY IR/ROXICODONE) 5 MG immediate release tablet Take 1 tablet (5 mg total) by mouth every 6 (six) hours as needed for severe pain (May take one additional tab as needed).   No facility-administered encounter medications on file as of 07/12/2018.     Activities of Daily Living In your present state of health, do you have any difficulty performing the following activities: 07/12/2018  Hearing? N  Vision? N  Difficulty concentrating or making decisions? N  Walking or climbing stairs? N  Dressing or bathing? N  Doing errands, shopping? N  Preparing Food and eating ? N  Using the Toilet? N  In the past six months, have you accidently leaked urine? N  Do you have problems with loss of bowel control? N  Managing your Medications? N  Managing your Finances? N  Housekeeping or managing your Housekeeping? N  Some recent data might be hidden    Patient Care Team: Ria Bush, MD as PCP - General (Family Medicine)   Assessment:   This is a routine wellness examination for Larry Goodwin.   Hearing Screening   125Hz  250Hz  500Hz  1000Hz  2000Hz  3000Hz  4000Hz   6000Hz  8000Hz   Right ear:   40 40 40  40    Left ear:   40 40 40  0      Visual Acuity Screening   Right eye Left eye Both eyes  Without correction: 20/70 20/50 20/25   With correction:        Exercise Activities and Dietary recommendations Current Exercise Habits: The patient does not participate  in regular exercise at present, Exercise limited by: orthopedic condition(s)  Goals    . Patient Stated     Starting 07/12/2018, I will continue to take medications as prescribed.        Fall Risk Fall Risk  07/12/2018 05/02/2018 02/08/2018 11/15/2017 05/17/2017  Falls in the past year? No No No No No   Depression Screen PHQ 2/9 Scores 07/12/2018 05/02/2018 02/08/2018 11/15/2017  PHQ - 2 Score 0 0 0 0  PHQ- 9 Score 0 - - -    Cognitive Function    MMSE - Mini Mental State Exam 07/12/2018  Orientation to time 5  Orientation to Place 5  Registration 3  Attention/ Calculation 0  Recall 3  Language- name 2 objects 0  Language- repeat 1  Language- follow 3 step command 3  Language- read & follow direction 0  Write a sentence 0  Copy design 0  Total score 20      There is no immunization history on file for this patient.  Screening Tests Health Maintenance  Topic Date Due  . OPHTHALMOLOGY EXAM  12/13/2018 (Originally 09/07/1985)  . DTaP/Tdap/Td (1 - Tdap) 07/13/2019 (Originally 09/07/1994)  . TETANUS/TDAP  07/13/2019 (Originally 09/07/1994)  . PNEUMOCOCCAL POLYSACCHARIDE VACCINE (1) 07/12/2049 (Originally 09/07/1977)  . HIV Screening  07/12/2049 (Originally 09/07/1990)  . INFLUENZA VACCINE  07/14/2018  . HEMOGLOBIN A1C  01/12/2019  . FOOT EXAM  07/13/2019       Plan:   I have personally reviewed, addressed, and noted the following in the patient's chart:  A. Medical and social history B. Use of alcohol, tobacco or illicit drugs  C. Current medications and supplements D. Functional ability and status E.  Nutritional status F.  Physical activity G. Advance directives H. List  of other physicians I.  Hospitalizations, surgeries, and ER visits in previous 12 months J.  Deale to include hearing, vision, cognitive, depression L. Referrals and appointments - none  In addition, I have reviewed and discussed with patient certain preventive protocols, quality metrics, and best practice recommendations. A written personalized care plan for preventive services as well as general preventive health recommendations were provided to patient.  See attached scanned questionnaire for additional information.   Signed,   Lindell Noe, MHA, BS, LPN Health Coach

## 2018-07-12 NOTE — Progress Notes (Signed)
I reviewed health advisor's note, was available for consultation, and agree with documentation and plan.  

## 2018-07-12 NOTE — Assessment & Plan Note (Signed)
Chronic, stable. Continue current regimen. 

## 2018-07-12 NOTE — Assessment & Plan Note (Signed)
This has completely resolved pointing to psychological cause. Will resolve.

## 2018-07-12 NOTE — Assessment & Plan Note (Signed)
Rx mupirocin. rec against removing scab. Update if not improving with treatment.

## 2018-07-12 NOTE — Assessment & Plan Note (Signed)
Reports compliance with 4000 IU daily. Lab results pending

## 2018-07-12 NOTE — Patient Instructions (Addendum)
Use mupirocin ointment to right nostril.  You are doing well today We will be in touch with blood work results. Return as needed or in 1 year for next wellness visit.

## 2018-07-12 NOTE — Patient Instructions (Signed)
Larry Goodwin , Thank you for taking time to come for your Medicare Wellness Visit. I appreciate your ongoing commitment to your health goals. Please review the following plan we discussed and let me know if I can assist you in the future.   These are the goals we discussed: Goals    . Patient Stated     Starting 07/12/2018, I will continue to take medications as prescribed.        This is a list of the screening recommended for you and due dates:  Health Maintenance  Topic Date Due  . Eye exam for diabetics  12/13/2018*  . DTaP/Tdap/Td vaccine (1 - Tdap) 07/13/2019*  . Tetanus Vaccine  07/13/2019*  . Pneumococcal vaccine (1) 07/12/2049*  . HIV Screening  07/12/2049*  . Flu Shot  07/14/2018  . Hemoglobin A1C  01/12/2019  . Complete foot exam   07/13/2019  *Topic was postponed. The date shown is not the original due date.   Preventive Care for Adults  A healthy lifestyle and preventive care can promote health and wellness. Preventive health guidelines for adults include the following key practices.  . A routine yearly physical is a good way to check with your health care provider about your health and preventive screening. It is a chance to share any concerns and updates on your health and to receive a thorough exam.  . Visit your dentist for a routine exam and preventive care every 6 months. Brush your teeth twice a day and floss once a day. Good oral hygiene prevents tooth decay and gum disease.  . The frequency of eye exams is based on your age, health, family medical history, use  of contact lenses, and other factors. Follow your health care provider's recommendations for frequency of eye exams.  . Eat a healthy diet. Foods like vegetables, fruits, whole grains, low-fat dairy products, and lean protein foods contain the nutrients you need without too many calories. Decrease your intake of foods high in solid fats, added sugars, and salt. Eat the right amount of calories for you.  Get information about a proper diet from your health care provider, if necessary.  . Regular physical exercise is one of the most important things you can do for your health. Most adults should get at least 150 minutes of moderate-intensity exercise (any activity that increases your heart rate and causes you to sweat) each week. In addition, most adults need muscle-strengthening exercises on 2 or more days a week.  Silver Sneakers may be a benefit available to you. To determine eligibility, you may visit the website: www.silversneakers.com or contact program at [phone] Mon-Fri between 8AM-8PM.   . Maintain a healthy weight. The body mass index (BMI) is a screening tool to identify possible weight problems. It provides an estimate of body fat based on height and weight. Your health care provider can find your BMI and can help you achieve or maintain a healthy weight.   For adults 20 years and older: ? A BMI below 18.5 is considered underweight. ? A BMI of 18.5 to 24.9 is normal. ? A BMI of 25 to 29.9 is considered overweight. ? A BMI of 30 and above is considered obese.   . Maintain normal blood lipids and cholesterol levels by exercising and minimizing your intake of saturated fat. Eat a balanced diet with plenty of fruit and vegetables. Blood tests for lipids and cholesterol should begin at age 67 and be repeated every 5 years. If your lipid or cholesterol  levels are high, you are over 50, or you are at high risk for heart disease, you may need your cholesterol levels checked more frequently. Ongoing high lipid and cholesterol levels should be treated with medicines if diet and exercise are not working.  . If you smoke, find out from your health care provider how to quit. If you do not use tobacco, please do not start.  . If you choose to drink alcohol, please do not consume more than 2 drinks per day. One drink is considered to be 12 ounces (355 mL) of beer, 5 ounces (148 mL) of wine, or  1.5 ounces (44 mL) of liquor.  . If you are 28-91 years old, ask your health care provider if you should take aspirin to prevent strokes.  . Use sunscreen. Apply sunscreen liberally and repeatedly throughout the day. You should seek shade when your shadow is shorter than you. Protect yourself by wearing long sleeves, pants, a wide-brimmed hat, and sunglasses year round, whenever you are outdoors.  . Once a month, do a whole body skin exam, using a mirror to look at the skin on your back. Tell your health care provider of new moles, moles that have irregular borders, moles that are larger than a pencil eraser, or moles that have changed in shape or color.

## 2018-07-12 NOTE — Progress Notes (Signed)
PCP notes:   Health maintenance:  Foot exam - PCP follow-up needed Eye exam - addressed; lack of insurance Tetanus vaccine - postponed/insurance PNA vaccine - pt declined HIV screening - pt declined A1C - completed  Abnormal screenings:   Hearing - failed  Hearing Screening   125Hz  250Hz  500Hz  1000Hz  2000Hz  3000Hz  4000Hz  6000Hz  8000Hz   Right ear:   40 40 40  40    Left ear:   40 40 40  0     Patient concerns:   None  Nurse concerns:  None  Next PCP appt:   07/12/18 @ 1500

## 2018-07-12 NOTE — Assessment & Plan Note (Signed)
Compliant with 1000 mcg daily. Lab results pending

## 2018-07-12 NOTE — Assessment & Plan Note (Signed)
Advanced planning - full code. Would want Chaya Jan (fiance) to be HCPOA.

## 2018-07-13 ENCOUNTER — Telehealth: Payer: Self-pay

## 2018-07-13 ENCOUNTER — Encounter: Payer: Self-pay | Admitting: Family Medicine

## 2018-07-13 NOTE — Telephone Encounter (Signed)
Pt called and request one of the nurses to call him back regarding his meds.

## 2018-07-13 NOTE — Telephone Encounter (Signed)
Asking to fill Oxycodone 1 day early. Advised ok to fill one day early, must last 30 days until the TLU date on the prescription. Pharmacy contacted and notified of this.

## 2018-08-02 ENCOUNTER — Ambulatory Visit: Payer: Medicare Other | Attending: Nurse Practitioner | Admitting: Nurse Practitioner

## 2018-08-02 ENCOUNTER — Encounter: Payer: Self-pay | Admitting: Nurse Practitioner

## 2018-08-02 ENCOUNTER — Other Ambulatory Visit: Payer: Self-pay

## 2018-08-02 VITALS — BP 128/95 | HR 63 | Temp 97.6°F | Resp 16 | Ht 74.0 in | Wt 250.0 lb

## 2018-08-02 DIAGNOSIS — Z6835 Body mass index (BMI) 35.0-35.9, adult: Secondary | ICD-10-CM | POA: Insufficient documentation

## 2018-08-02 DIAGNOSIS — M4726 Other spondylosis with radiculopathy, lumbar region: Secondary | ICD-10-CM | POA: Insufficient documentation

## 2018-08-02 DIAGNOSIS — Z87891 Personal history of nicotine dependence: Secondary | ICD-10-CM | POA: Diagnosis not present

## 2018-08-02 DIAGNOSIS — G8929 Other chronic pain: Secondary | ICD-10-CM | POA: Diagnosis not present

## 2018-08-02 DIAGNOSIS — E119 Type 2 diabetes mellitus without complications: Secondary | ICD-10-CM | POA: Insufficient documentation

## 2018-08-02 DIAGNOSIS — R790 Abnormal level of blood mineral: Secondary | ICD-10-CM

## 2018-08-02 DIAGNOSIS — I10 Essential (primary) hypertension: Secondary | ICD-10-CM | POA: Diagnosis not present

## 2018-08-02 DIAGNOSIS — Z5181 Encounter for therapeutic drug level monitoring: Secondary | ICD-10-CM | POA: Insufficient documentation

## 2018-08-02 DIAGNOSIS — G894 Chronic pain syndrome: Secondary | ICD-10-CM

## 2018-08-02 DIAGNOSIS — M48061 Spinal stenosis, lumbar region without neurogenic claudication: Secondary | ICD-10-CM | POA: Diagnosis not present

## 2018-08-02 DIAGNOSIS — E538 Deficiency of other specified B group vitamins: Secondary | ICD-10-CM | POA: Diagnosis not present

## 2018-08-02 DIAGNOSIS — M47816 Spondylosis without myelopathy or radiculopathy, lumbar region: Secondary | ICD-10-CM

## 2018-08-02 DIAGNOSIS — M5116 Intervertebral disc disorders with radiculopathy, lumbar region: Secondary | ICD-10-CM | POA: Diagnosis not present

## 2018-08-02 DIAGNOSIS — F411 Generalized anxiety disorder: Secondary | ICD-10-CM | POA: Insufficient documentation

## 2018-08-02 DIAGNOSIS — Z881 Allergy status to other antibiotic agents status: Secondary | ICD-10-CM | POA: Diagnosis not present

## 2018-08-02 DIAGNOSIS — M25551 Pain in right hip: Secondary | ICD-10-CM | POA: Diagnosis not present

## 2018-08-02 DIAGNOSIS — F329 Major depressive disorder, single episode, unspecified: Secondary | ICD-10-CM | POA: Insufficient documentation

## 2018-08-02 DIAGNOSIS — E559 Vitamin D deficiency, unspecified: Secondary | ICD-10-CM | POA: Insufficient documentation

## 2018-08-02 DIAGNOSIS — Z79899 Other long term (current) drug therapy: Secondary | ICD-10-CM | POA: Insufficient documentation

## 2018-08-02 DIAGNOSIS — Z79891 Long term (current) use of opiate analgesic: Secondary | ICD-10-CM | POA: Diagnosis not present

## 2018-08-02 MED ORDER — OXYCODONE HCL 5 MG PO TABS: 5.0000 mg | ORAL_TABLET | Freq: Four times a day (QID) | ORAL | 0 refills | Status: DC | PRN

## 2018-08-02 NOTE — Progress Notes (Signed)
Nursing Pain Medication Assessment:  Safety precautions to be maintained throughout the outpatient stay will include: orient to surroundings, keep bed in low position, maintain call bell within reach at all times, provide assistance with transfer out of bed and ambulation.  Medication Inspection Compliance: Pill count conducted under aseptic conditions, in front of the patient. Neither the pills nor the bottle was removed from the patient's sight at any time. Once count was completed pills were immediately returned to the patient in their original bottle.  Medication: Oxycodone IR Pill/Patch Count: 50 of 130 pills remain Pill/Patch Appearance: Markings consistent with prescribed medication Bottle Appearance: Standard pharmacy container. Clearly labeled. Filled Date: 07/ 31 / 2019 Last Medication intake:  Today

## 2018-08-02 NOTE — Progress Notes (Signed)
Patient's Name: Larry Goodwin  MRN: [id number]  Referring Provider: Ria Bush, MD  DOB: 01-22-1976  PCP: Ria Bush, MD  DOS: 08/02/2018  Note by: Vevelyn Francois NP  Service setting: Ambulatory outpatient  Specialty: Interventional Pain Management  Location: [facility] (AMB) Pain Management Facility    Patient type: Established    Primary Reason(s) for Visit: Encounter for prescription drug management. (Level of risk: moderate)  CC: Back Pain (mid to lower)  HPI  Larry Goodwin is a 42 y.o. year old, male patient, who comes today for a medication management evaluation. He has Acute sinusitis; Lumbar DDD (degenerative disc disease); HTN (hypertension); Bell's palsy; Severe obesity (BMI 35.0-35.9 with comorbidity) ([place]); Chronic pain syndrome; History of type 2 diabetes mellitus; History of tobacco abuse; Low magnesium levels; Vitamin D deficiency; Myofascial pain syndrome; Lumbar facet syndrome (Bilateral) (R>L); Chronic upper back pain (Location of Tertiary source of pain); Generalized anxiety disorder; Depression; Lumbar spondylosis with radiculopathy; Epidural fibrosis; Failed back surgical syndrome; Encounter for therapeutic drug level monitoring; Long term prescription opiate use; Long term current use of opiate analgesic; Opiate use (30 MME/Day); Chronic low back pain (Location of Primary Source of Pain) (Bilateral) (R>L); Chronic sacroiliac joint pain (Right); Abnormal MRI, lumbar spine;  Abnormal CT myelogram of the thoracic spine; Thoracic disc herniation; Bulge of lumbar disc without myelopathy; Vertebral body hemangioma; Disturbance of skin sensation; Cervical IVDD (intervertebral disc displacement); Cervical foraminal stenosis (C5-6 and C7-T1: Right); Cervical central spinal stenosis (C5-6); Thoracic IVDD (intervertebral disc displacement); Thoracic facet arthropathy; Thoracic facet syndrome ([place]); Lumbar IVDD (intervertebral disc displacement); Lumbar foraminal stenosis (Right L4-5);  Lumbar central spinal stenosis (L3-4 and L4-5); Chronic knee pain (Right); Osteoarthritis of knee (Right); Chronic hip pain (Location of Secondary source of pain) (Right); Chronic shoulder pain (Location of Tertiary source of pain) (Bilateral) (Right); Vitamin B12 deficiency; Internal nasal lesion; and Advanced care planning/counseling discussion on their problem list. His primarily concern today is the Back Pain (mid to lower)  Pain Assessment: Location: Mid, Lower Back Radiating: radiates into right buttocks, sometimes to knee on the side of leg Onset: More than a month ago Duration: Chronic pain Quality: Constant, Discomfort, Radiating Severity: 3 /10 (subjective, self-reported pain score)  Note: Reported level is compatible with observation.                          Effect on ADL: " I cant go as long" Timing: Constant Modifying factors: lying down BP: (!) 128/95  HR: 63  Larry Goodwin was last scheduled for an appointment on 05/02/2018 for medication management. During today's appointment we reviewed Larry Goodwin's chronic pain status, as well as his outpatient medication regimen. He admits that his pain is stable. He denies any current concerns. He denies any side effects of his medication.  The patient  reports that he does not use drugs. His body mass index is 32.1 kg/m.  Further details on both, my assessment(s), as well as the proposed treatment plan, please see below.  Controlled Substance Pharmacotherapy Assessment REMS (Risk Evaluation and Mitigation Strategy)  Analgesic:Oxycodone IR 5 mg every 6 hours (20 mg/day) MME/day:30 mg/day.   Larry Shorter, RN  08/02/2018  9:20 AM  Signed Nursing Pain Medication Assessment:  Safety precautions to be maintained throughout the outpatient stay will include: orient to surroundings, keep bed in low position, maintain call bell within reach at all times, provide assistance with transfer out of bed and ambulation.  Medication Inspection  Compliance: Pill count conducted under aseptic conditions, in front of the patient. Neither the pills nor the bottle was removed from the patient's sight at any time. Once count was completed pills were immediately returned to the patient in their original bottle.  Medication: Oxycodone IR Pill/Patch Count: 50 of 130 pills remain Pill/Patch Appearance: Markings consistent with prescribed medication Bottle Appearance: Standard pharmacy container. Clearly labeled. Filled Date: 07/ 31 / 2019 Last Medication intake:  Today   Pharmacokinetics: Liberation and absorption (onset of action): WNL Distribution (time to peak effect): WNL Metabolism and excretion (duration of action): WNL         Pharmacodynamics: Desired effects: Analgesia: Larry Goodwin reports >50% benefit. Functional ability: Patient reports that medication allows him to accomplish basic ADLs Clinically meaningful improvement in function (CMIF): Sustained CMIF goals met Perceived effectiveness: Described as relatively effective, allowing for increase in activities of daily living (ADL) Undesirable effects: Side-effects or Adverse reactions: None reported Monitoring: [place] PMP: Online review of the past 76-monthperiod conducted. Compliant with practice rules and regulations Last UDS on record: Summary  Date Value Ref Range Status  05/02/2018 FINAL  Final    Comment:    ==================================================================== TOXASSURE SELECT 13 (MW) ==================================================================== Test                             Result       Flag       Units Drug Present and Declared for Prescription Verification   Oxycodone                      772          EXPECTED   ng/mg creat   Oxymorphone                    606          EXPECTED   ng/mg creat   Noroxycodone                   1533         EXPECTED   ng/mg creat   Noroxymorphone                 270          EXPECTED   ng/mg creat    Sources  of oxycodone are scheduled prescription medications.    Oxymorphone, noroxycodone, and noroxymorphone are expected    metabolites of oxycodone. Oxymorphone is also available as a    scheduled prescription medication. ==================================================================== Test                      Result    Flag   Units      Ref Range   Creatinine              81               mg/dL      >=20 ==================================================================== Declared Medications:  The flagging and interpretation on this report are based on the  following declared medications.  Unexpected results may arise from  inaccuracies in the declared medications.  **Note: The testing scope of this panel includes these medications:  Oxycodone (Roxicodone)  **Note: The testing scope of this panel does not include following  reported medications:  Cyanocobalamin  Lisinopril  Magnesium Oxide  Sildenafil (Revatio)  Vitamin D3 ==================================================================== For clinical consultation, please call ([phone] ====================================================================  UDS interpretation: Compliant          Medication Assessment Form: Reviewed. Patient indicates being compliant with therapy Treatment compliance: Compliant Risk Assessment Profile: Aberrant behavior: See prior evaluations. None observed or detected today Comorbid factors increasing risk of overdose: See prior notes. No additional risks detected today Opioid risk tool (ORT) (Total Score): 0 Personal History of Substance Abuse (SUD-Substance use disorder):  Alcohol: Negative  Illegal Drugs: Negative  Rx Drugs: Negative  ORT Risk Level calculation: Low Risk Risk of substance use disorder (SUD): Low Opioid Risk Tool - 08/02/18 0918      Family History of Substance Abuse   Alcohol  Negative    Illegal Drugs  Negative    Rx Drugs  Negative      Personal History of  Substance Abuse   Alcohol  Negative    Illegal Drugs  Negative    Rx Drugs  Negative      Age   Age between 67-45 years   No      History of Preadolescent Sexual Abuse   History of Preadolescent Sexual Abuse  Negative or Male      Psychological Disease   Psychological Disease  Negative    Depression  Negative      Total Score   Opioid Risk Tool Scoring  0    Opioid Risk Interpretation  Low Risk      ORT Scoring interpretation table:  Score <3 = Low Risk for SUD  Score between 4-7 = Moderate Risk for SUD  Score >8 = High Risk for Opioid Abuse   Risk Mitigation Strategies:  Patient Counseling: Covered Patient-Prescriber Agreement (PPA): Present and active  Notification to other healthcare providers: Done  Pharmacologic Plan: No change in therapy, at this time.             Laboratory Chemistry  Inflammation Markers (CRP: Acute Phase) (ESR: Chronic Phase) Lab Results  Component Value Date   CRP 0.8 06/09/2016   ESRSEDRATE 6 06/09/2016                         Rheumatology Markers No results found for: RF, ANA, LABURIC, URICUR, LYMEIGGIGMAB, LYMEABIGMQN, HLAB27                      Renal Function Markers Lab Results  Component Value Date   BUN 16 07/12/2018   CREATININE 0.83 07/12/2018   GFRAA >60 06/09/2016   GFRNONAA >60 06/09/2016                             Hepatic Function Markers Lab Results  Component Value Date   AST 11 02/01/2017   ALT 10 02/01/2017   ALBUMIN 4.2 02/01/2017   ALKPHOS 49 02/01/2017   LIPASE 153 09/21/2012                        Electrolytes Lab Results  Component Value Date   NA 137 07/12/2018   K 4.3 07/12/2018   CL 101 07/12/2018   CALCIUM 9.4 07/12/2018   MG 1.9 07/12/2018                        Neuropathy Markers Lab Results  Component Value Date   VITAMINB12 736 07/12/2018   HGBA1C 5.3 07/12/2018  Bone Pathology Markers Lab Results  Component Value Date   VD25OH 43.98 07/12/2018    25OHVITD1 31 06/09/2016   25OHVITD2 6.6 06/09/2016   25OHVITD3 24 06/09/2016                         Coagulation Parameters Lab Results  Component Value Date   PLT 240 08/03/2014                        Cardiovascular Markers Lab Results  Component Value Date   CKTOTAL 46 07/10/2011   CKMB 1.4 07/10/2011   TROPONINI < 0.02 12/23/2011   HGB 14.9 08/03/2014   HCT 43.9 08/03/2014                         CA Markers No results found for: CEA, CA125, LABCA2                      Note: Lab results reviewed.  Recent Diagnostic Imaging Results   Complexity Note: No new results found.                         Meds   Current Outpatient Medications:  .  Cholecalciferol (VITAMIN D3) 2000 units capsule, Take 1 capsule (2,000 Units total) by mouth daily. (Patient taking differently: Take 4,000 Units by mouth daily. ), Disp: 90 capsule, Rfl: 0 .  lisinopril (PRINIVIL,ZESTRIL) 20 MG tablet, Take 2 tablets (40 mg total) by mouth daily., Disp: 90 tablet, Rfl: 3 .  Magnesium Oxide 500 MG CAPS, Take 1 capsule (500 mg total) by mouth daily., Disp: 90 capsule, Rfl: 0 .  [START ON 10/12/2018] oxyCODONE (OXY IR/ROXICODONE) 5 MG immediate release tablet, Take 1 tablet (5 mg total) by mouth every 6 (six) hours as needed for severe pain (May take one additional tab as needed)., Disp: 130 tablet, Rfl: 0 .  vitamin B-12 (CYANOCOBALAMIN) 1000 MCG tablet, Take 1,000 mcg by mouth daily., Disp: , Rfl:  .  [START ON 09/12/2018] oxyCODONE (OXY IR/ROXICODONE) 5 MG immediate release tablet, Take 1 tablet (5 mg total) by mouth every 6 (six) hours as needed for severe pain (May take one additional tab as needed)., Disp: 130 tablet, Rfl: 0 .  [START ON 08/13/2018] oxyCODONE (OXY IR/ROXICODONE) 5 MG immediate release tablet, Take 1 tablet (5 mg total) by mouth every 6 (six) hours as needed for severe pain (May take one additional tab as needed)., Disp: 130 tablet, Rfl: 0  ROS  Constitutional: Denies any fever or  chills Gastrointestinal: No reported hemesis, hematochezia, vomiting, or acute GI distress Musculoskeletal: Denies any acute onset joint swelling, redness, loss of ROM, or weakness Neurological: No reported episodes of acute onset apraxia, aphasia, dysarthria, agnosia, amnesia, paralysis, loss of coordination, or loss of consciousness  Allergies  Mr. Vaquera is allergic to levaquin [levofloxacin in d5w].  [place]  Drug: Mr. Raborn  reports that he does not use drugs. Alcohol:  reports that he does not drink alcohol. Tobacco:  reports that he quit smoking about 10 years ago. He has never used smokeless tobacco. Medical:  has a past medical history of Bell's palsy (2015), Chronic pain syndrome, DDD (degenerative disc disease), lumbar, HTN (hypertension), and Morbid obesity ([place]) (08/16/2014). Surgical: Mr. Decarlo  has a past surgical history that includes Back surgery (2012) and Cholecystectomy. Family: family history includes COPD in his mother; Hypertension  in his other.  Constitutional Exam  General appearance: Well nourished, well developed, and well hydrated. In no apparent acute distress Vitals:   08/02/18 0912  BP: (!) 128/95  Pulse: 63  Resp: 16  Temp: 97.6 F (36.4 C)  SpO2: 98%  Weight: 250 lb (113.4 kg)  Height: _0  (1.88 m)  Psych/Mental status: Alert, oriented x 3 (person, place, & time)       Eyes: PERLA Respiratory: No evidence of acute respiratory distress  Lumbar Spine Area Exam  Skin & Axial Inspection: No masses, redness, or swelling Alignment: Symmetrical Functional ROM: Unrestricted ROM       Stability: No instability detected Muscle Tone/Strength: Functionally intact. No obvious neuro-muscular anomalies detected. Sensory (Neurological): Unimpaired Palpation: No palpable anomalies        Gait & Posture Assessment  Ambulation: Unassisted Gait: Relatively normal for age and body habitus Posture: WNL   Lower Extremity Exam    Side: Right lower extremity   Side: Left lower extremity  Stability: No instability observed          Stability: No instability observed          Skin & Extremity Inspection: Skin color, temperature, and hair growth are WNL. No peripheral edema or cyanosis. No masses, redness, swelling, asymmetry, or associated skin lesions. No contractures.  Skin & Extremity Inspection: Skin color, temperature, and hair growth are WNL. No peripheral edema or cyanosis. No masses, redness, swelling, asymmetry, or associated skin lesions. No contractures.  Functional ROM: Unrestricted ROM                  Functional ROM: Unrestricted ROM                  Muscle Tone/Strength: Functionally intact. No obvious neuro-muscular anomalies detected.  Muscle Tone/Strength: Functionally intact. No obvious neuro-muscular anomalies detected.  Sensory (Neurological): Unimpaired  Sensory (Neurological): Unimpaired  Palpation: No palpable anomalies  Palpation: No palpable anomalies   Assessment  Primary Diagnosis & Pertinent Problem List: The primary encounter diagnosis was Lumbar spondylosis with radiculopathy. Diagnoses of Lumbar facet syndrome (Bilateral) (R>L), Chronic hip pain (Location of Secondary source of pain) (Right), Vitamin D deficiency, Low magnesium levels, Chronic pain syndrome, and Long term prescription opiate use were also pertinent to this visit.  Status Diagnosis  Controlled Controlled Controlled 1. Lumbar spondylosis with radiculopathy   2. Lumbar facet syndrome (Bilateral) (R>L)   3. Chronic hip pain (Location of Secondary source of pain) (Right)   4. Vitamin D deficiency   5. Low magnesium levels   6. Chronic pain syndrome   7. Long term prescription opiate use     Problems updated and reviewed during this visit: No problems updated. Plan of Care  Pharmacotherapy (Medications Ordered): Meds ordered this encounter  Medications  . oxyCODONE (OXY IR/ROXICODONE) 5 MG immediate release tablet    Sig: Take 1 tablet (5 mg total)  by mouth every 6 (six) hours as needed for severe pain (May take one additional tab as needed).    Dispense:  130 tablet    Refill:  0    Do not place this medication on "Automatic Refill". Patient may have prescription filled one day early if pharmacy is closed on scheduled refill date. Do not fill until: 10/12/2018 To last until:11/11/2018    Order Specific Question:   Supervising Provider    Answer:   Milinda Pointer [phone]  . oxyCODONE (OXY IR/ROXICODONE) 5 MG immediate release tablet    Sig: Take  1 tablet (5 mg total) by mouth every 6 (six) hours as needed for severe pain (May take one additional tab as needed).    Dispense:  130 tablet    Refill:  0    Do not place this medication on "Automatic Refill". Patient may have prescription filled one day early if pharmacy is closed on scheduled refill date. Do not fill until:09/12/2018 To last until:10/12/2018    Order Specific Question:   Supervising Provider    Answer:   Milinda Pointer [phone]  . oxyCODONE (OXY IR/ROXICODONE) 5 MG immediate release tablet    Sig: Take 1 tablet (5 mg total) by mouth every 6 (six) hours as needed for severe pain (May take one additional tab as needed).    Dispense:  130 tablet    Refill:  0    Do not place this medication on "Automatic Refill". Patient may have prescription filled one day early if pharmacy is closed on scheduled refill date. Do not fill until: 08/13/2018 To last until:09/12/2018    Order Specific Question:   Supervising Provider    Answer:   Milinda Pointer [[id number]]   New Prescriptions   No medications on file   Medications administered today: Natale Lay had no medications administered during this visit. Lab-work, procedure(s), and/or referral(s): Orders Placed This Encounter  Procedures  . ToxASSURE Select 13 (MW), Urine   Imaging and/or referral(s): None Interventional therapies: Planned, scheduled, and/or pending: Not at this time.    Considering:   Diagnostic right-sided cervical epidural steroid injection  Diagnostic thoracic epidural steroid injection  Diagnostic bilateral thoracic facet block  Possible bilateral thoracic facet radiofrequency ablation  Diagnostic bilateral lumbar facet block  Possible bilateral lumbar facet radiofrequency ablation  Diagnostic right sacroiliac joint block  Possible right sacroiliac joint radiofrequency ablation  Diagnostic caudal epidural steroid injection and epidurogram  Diagnostic right-sided L4-5 transforaminal epidural steroid injection  Diagnostic right intra-articular knee injection  Diagnostic right genicular nerve block  Possible right genicular radiofrequency ablation    Palliative PRN treatment(s):  Palliativeright-sided cervical epidural steroid injection  Palliativethoracic epidural steroid injection  Palliativebilateral thoracic facet block  Palliativebilateral lumbar facet block  Palliativeright sacroiliac joint block  Palliativecaudal epidural steroid injection and epidurogram  Palliativeright-sided L4-5 transforaminal epidural steroid injection  Palliativeright intra-articular knee injection  Palliativeright genicular nerve block    Provider-requested follow-up: Return in about 3 months (around 11/02/2018) for MedMgmt with Me Donella Stade [facility] Pace).  Future Appointments  Date Time Provider [place]  11/02/2018  9:00 AM Vevelyn Francois, NP [facility] None  07/17/2019  2:30 PM Eustace Pen, LPN [facility]  02/15/1937  3:00 PM Ria Bush, MD [facility]   Primary Care Physician: Ria Bush, MD Location: [facility] Management Facility Note by: Vevelyn Francois NP Date: 08/02/2018; Time: 11:11 AM  Pain Score Disclaimer: We use the NRS-11 scale. This is a self-reported, subjective measurement of pain severity with only modest accuracy. It is used primarily to identify changes within a particular patient. It must be understood that  outpatient pain scales are significantly less accurate that those used for research, where they can be applied under ideal controlled circumstances with minimal exposure to variables. In reality, the score is likely to be a combination of pain intensity and pain affect, where pain affect describes the degree of emotional arousal or changes in action readiness caused by the sensory experience of pain. Factors such as social and work situation, setting, emotional state, anxiety levels, expectation, and prior  pain experience may influence pain perception and show large inter-individual differences that may also be affected by time variables.  Patient instructions provided during this appointment: Patient Instructions  You have been given 3 Rx for oxycodone to last until 11/11/2018.____________________________________________________________________________________________  Medication Rules  Applies to: All patients receiving prescriptions (written or electronic).  Pharmacy of record: Pharmacy where electronic prescriptions will be sent. If written prescriptions are taken to a different pharmacy, please inform the nursing staff. The pharmacy listed in the electronic medical record should be the one where you would like electronic prescriptions to be sent.  Prescription refills: Only during scheduled appointments. Applies to both, written and electronic prescriptions.  NOTE: The following applies primarily to controlled substances (Opioid* Pain Medications).   Patient's responsibilities: 1. Pain Pills: Bring all pain pills to every appointment (except for procedure appointments). 2. Pill Bottles: Bring pills in original pharmacy bottle. Always bring newest bottle. Bring bottle, even if empty. 3. Medication refills: You are responsible for knowing and keeping track of what medications you need refilled. The day before your appointment, write a list of all prescriptions that need to be refilled. Bring  that list to your appointment and give it to the admitting nurse. Prescriptions will be written only during appointments. If you forget a medication, it will not be "Called in", "Faxed", or "electronically sent". You will need to get another appointment to get these prescribed. 4. Prescription Accuracy: You are responsible for carefully inspecting your prescriptions before leaving our office. Have the discharge nurse carefully go over each prescription with you, before taking them home. Make sure that your name is accurately spelled, that your address is correct. Check the name and dose of your medication to make sure it is accurate. Check the number of pills, and the written instructions to make sure they are clear and accurate. Make sure that you are given enough medication to last until your next medication refill appointment. 5. Taking Medication: Take medication as prescribed. Never take more pills than instructed. Never take medication more frequently than prescribed. Taking less pills or less frequently is permitted and encouraged, when it comes to controlled substances (written prescriptions).  6. Inform other Doctors: Always inform, all of your healthcare providers, of all the medications you take. 7. Pain Medication from other Providers: You are not allowed to accept any additional pain medication from any other Doctor or Healthcare provider. There are two exceptions to this rule. (see below) In the event that you require additional pain medication, you are responsible for notifying us, as stated below. 8. Medication Agreement: You are responsible for carefully reading and following our Medication Agreement. This must be signed before receiving any prescriptions from our practice. Safely store a copy of your signed Agreement. Violations to the Agreement will result in no further prescriptions. (Additional copies of our Medication Agreement are available upon request.) 9. Laws, Rules, & Regulations:  All patients are expected to follow all Federal and [employer], [employer], Rules, [employer]. Ignorance of the Laws does not constitute a valid excuse. The use of any illegal substances is prohibited. 10. Adopted CDC guidelines & recommendations: Target dosing levels will be at or below 60 MME/day. Use of benzodiazepines** is not recommended.  Exceptions: There are only two exceptions to the rule of not receiving pain medications from other Healthcare Providers. 1. Exception #1 (Emergencies): In the event of an emergency (i.e.: accident requiring emergency care), you are allowed to receive additional pain medication. However, you are responsible for: As  soon as you are able, call our office [phone], at any time of the day or night, and leave a message stating your name, the date and nature of the emergency, and the name and dose of the medication prescribed. In the event that your call is answered by a member of our staff, make sure to document and save the date, time, and the name of the person that took your information.  2. Exception #2 (Planned Surgery): In the event that you are scheduled by another doctor or dentist to have any type of surgery or procedure, you are allowed (for a period no longer than 30 days), to receive additional pain medication, for the acute post-op pain. However, in this case, you are responsible for picking up a copy of our "Post-op Pain Management for Surgeons" handout, and giving it to your surgeon or dentist. This document is available at our office, and does not require an appointment to obtain it. Simply go to our office during business hours (Monday-Thursday from 8:00 AM to 4:00 PM) (Friday 8:00 AM to 12:00 Noon) or if you have a scheduled appointment with [place], prior to your surgery, and ask for it by name. In addition, you will need to provide [place] with your name, name of your surgeon, type of surgery, and date of procedure or surgery.  *Opioid medications include:  morphine, codeine, oxycodone, oxymorphone, hydrocodone, hydromorphone, meperidine, tramadol, tapentadol, buprenorphine, fentanyl, methadone. **Benzodiazepine medications include: diazepam (Valium), alprazolam (Xanax), clonazepam (Klonopine), lorazepam (Ativan), clorazepate (Tranxene), chlordiazepoxide (Librium), estazolam (Prosom), oxazepam (Serax), temazepam (Restoril), triazolam (Halcion) (Last updated: 02/10/2018) ____________________________________________________________________________________________

## 2018-08-02 NOTE — Patient Instructions (Addendum)
You have been given 3 Rx for oxycodone to last until 11/11/2018.____________________________________________________________________________________________  Medication Rules  Applies to: All patients receiving prescriptions (written or electronic).  Pharmacy of record: Pharmacy where electronic prescriptions will be sent. If written prescriptions are taken to a different pharmacy, please inform the nursing staff. The pharmacy listed in the electronic medical record should be the one where you would like electronic prescriptions to be sent.  Prescription refills: Only during scheduled appointments. Applies to both, written and electronic prescriptions.  NOTE: The following applies primarily to controlled substances (Opioid* Pain Medications).   Patient's responsibilities: 1. Pain Pills: Bring all pain pills to every appointment (except for procedure appointments). 2. Pill Bottles: Bring pills in original pharmacy bottle. Always bring newest bottle. Bring bottle, even if empty. 3. Medication refills: You are responsible for knowing and keeping track of what medications you need refilled. The day before your appointment, write a list of all prescriptions that need to be refilled. Bring that list to your appointment and give it to the admitting nurse. Prescriptions will be written only during appointments. If you forget a medication, it will not be "Called in", "Faxed", or "electronically sent". You will need to get another appointment to get these prescribed. 4. Prescription Accuracy: You are responsible for carefully inspecting your prescriptions before leaving our office. Have the discharge nurse carefully go over each prescription with you, before taking them home. Make sure that your name is accurately spelled, that your address is correct. Check the name and dose of your medication to make sure it is accurate. Check the number of pills, and the written instructions to make sure they are clear and  accurate. Make sure that you are given enough medication to last until your next medication refill appointment. 5. Taking Medication: Take medication as prescribed. Never take more pills than instructed. Never take medication more frequently than prescribed. Taking less pills or less frequently is permitted and encouraged, when it comes to controlled substances (written prescriptions).  6. Inform other Doctors: Always inform, all of your healthcare providers, of all the medications you take. 7. Pain Medication from other Providers: You are not allowed to accept any additional pain medication from any other Doctor or Healthcare provider. There are two exceptions to this rule. (see below) In the event that you require additional pain medication, you are responsible for notifying us, as stated below. 8. Medication Agreement: You are responsible for carefully reading and following our Medication Agreement. This must be signed before receiving any prescriptions from our practice. Safely store a copy of your signed Agreement. Violations to the Agreement will result in no further prescriptions. (Additional copies of our Medication Agreement are available upon request.) 9. Laws, Rules, & Regulations: All patients are expected to follow all Federal and [employer], [employer], Rules, [employer]. Ignorance of the Laws does not constitute a valid excuse. The use of any illegal substances is prohibited. 10. Adopted CDC guidelines & recommendations: Target dosing levels will be at or below 60 MME/day. Use of benzodiazepines** is not recommended.  Exceptions: There are only two exceptions to the rule of not receiving pain medications from other Healthcare Providers. 1. Exception #1 (Emergencies): In the event of an emergency (i.e.: accident requiring emergency care), you are allowed to receive additional pain medication. However, you are responsible for: As soon as you are able, call our office [phone], at any  time of the day or night, and leave a message stating your name, the date and nature  of the emergency, and the name and dose of the medication prescribed. In the event that your call is answered by a member of our staff, make sure to document and save the date, time, and the name of the person that took your information.  2. Exception #2 (Planned Surgery): In the event that you are scheduled by another doctor or dentist to have any type of surgery or procedure, you are allowed (for a period no longer than 30 days), to receive additional pain medication, for the acute post-op pain. However, in this case, you are responsible for picking up a copy of our "Post-op Pain Management for Surgeons" handout, and giving it to your surgeon or dentist. This document is available at our office, and does not require an appointment to obtain it. Simply go to our office during business hours (Monday-Thursday from 8:00 AM to 4:00 PM) (Friday 8:00 AM to 12:00 Noon) or if you have a scheduled appointment with [place], prior to your surgery, and ask for it by name. In addition, you will need to provide [place] with your name, name of your surgeon, type of surgery, and date of procedure or surgery.  *Opioid medications include: morphine, codeine, oxycodone, oxymorphone, hydrocodone, hydromorphone, meperidine, tramadol, tapentadol, buprenorphine, fentanyl, methadone. **Benzodiazepine medications include: diazepam (Valium), alprazolam (Xanax), clonazepam (Klonopine), lorazepam (Ativan), clorazepate (Tranxene), chlordiazepoxide (Librium), estazolam (Prosom), oxazepam (Serax), temazepam (Restoril), triazolam (Halcion) (Last updated: 02/10/2018) ____________________________________________________________________________________________

## 2018-08-06 LAB — TOXASSURE SELECT 13 (MW), URINE

## 2018-08-26 ENCOUNTER — Ambulatory Visit: Payer: Medicare Other | Admitting: Family Medicine

## 2018-08-26 DIAGNOSIS — Z0289 Encounter for other administrative examinations: Secondary | ICD-10-CM

## 2018-08-29 ENCOUNTER — Encounter: Payer: Self-pay | Admitting: Family Medicine

## 2018-08-29 ENCOUNTER — Ambulatory Visit (INDEPENDENT_AMBULATORY_CARE_PROVIDER_SITE_OTHER): Payer: Medicare Other | Admitting: Family Medicine

## 2018-08-29 VITALS — BP 118/70 | HR 76 | Temp 97.8°F | Ht 73.0 in | Wt 273.5 lb

## 2018-08-29 DIAGNOSIS — Z6835 Body mass index (BMI) 35.0-35.9, adult: Secondary | ICD-10-CM | POA: Diagnosis not present

## 2018-08-29 DIAGNOSIS — J019 Acute sinusitis, unspecified: Secondary | ICD-10-CM

## 2018-08-29 MED ORDER — AMOXICILLIN-POT CLAVULANATE 875-125 MG PO TABS: 1.0000 | ORAL_TABLET | Freq: Two times a day (BID) | ORAL | 0 refills | Status: AC

## 2018-08-29 NOTE — Assessment & Plan Note (Signed)
Pt endorses ongoing healthy diet and lifestyle changes - minimized sugar in diet. Remains frustrated that weight not reflecting healthy changes. Encouraged to focus on other health benefits noted - good blood pressures and sugar control, feels clothes fit him better, dropping waist size etc.

## 2018-08-29 NOTE — Patient Instructions (Signed)
You have a sinus infection. Take medicine as prescribed: augmentin 10 days.  Push fluids and plenty of rest. Nasal saline irrigation or neti pot to help drain sinuses. May use plain mucinex with plenty of fluid to help mobilize mucous. Please let us know if fever >101.5, trouble opening/closing mouth, difficulty swallowing, or worsening instead of improving as expected.

## 2018-08-29 NOTE — Progress Notes (Signed)
BP 118/70 (BP Location: Left Arm, Patient Position: Sitting, Cuff Size: Large)   Pulse 76   Temp 97.8 F (36.6 C) (Oral)   Ht 6\' 1"  (1.854 m)   Wt 273 lb 8 oz (124.1 kg)   SpO2 96%   BMI 36.08 kg/m    CC: sinus congestion Subjective:    Patient ID: Larry Goodwin, male    DOB: 04-06-1976, 42 y.o.   MRN: [id number]  HPI: Larry Goodwin is a 42 y.o. male presenting on 08/29/2018 for Sinus Problem (C/o nasal congestion, HA and sinus pressure for about 1 mo. H/o sinus inf a few times a yr. )   20mo h/o sinus pressure and congestion, maxillary facial pain, mild dizziness, leading to posterior headache. Thick mucous.   No fevers/chills, ear or tooth pain, coughing  No sick contacts at home.  Using steam to nose. Ex smoker. Avoids OTC remedies.   Weight gain noted - however he states he's losing weight. Avoids sugars, breads. Frustrated that weight gain noted today.   Relevant past medical, surgical, family and social history reviewed and updated as indicated. Interim medical history since our last visit reviewed. Allergies and medications reviewed and updated. Outpatient Medications Prior to Visit  Medication Sig Dispense Refill  . Cholecalciferol (VITAMIN D3) 2000 units capsule Take 1 capsule (2,000 Units total) by mouth daily. (Patient taking differently: Take 4,000 Units by mouth daily. ) 90 capsule 0  . lisinopril (PRINIVIL,ZESTRIL) 20 MG tablet Take 2 tablets (40 mg total) by mouth daily. 90 tablet 3  . [START ON 10/12/2018] oxyCODONE (OXY IR/ROXICODONE) 5 MG immediate release tablet Take 1 tablet (5 mg total) by mouth every 6 (six) hours as needed for severe pain (May take one additional tab as needed). 130 tablet 0  . [START ON 09/12/2018] oxyCODONE (OXY IR/ROXICODONE) 5 MG immediate release tablet Take 1 tablet (5 mg total) by mouth every 6 (six) hours as needed for severe pain (May take one additional tab as needed). 130 tablet 0  . oxyCODONE (OXY IR/ROXICODONE) 5 MG  immediate release tablet Take 1 tablet (5 mg total) by mouth every 6 (six) hours as needed for severe pain (May take one additional tab as needed). 130 tablet 0  . vitamin B-12 (CYANOCOBALAMIN) 1000 MCG tablet Take 1,000 mcg by mouth daily.    . Magnesium Oxide 500 MG CAPS Take 1 capsule (500 mg total) by mouth daily. 90 capsule 0   No facility-administered medications prior to visit.      Per HPI unless specifically indicated in ROS section below Review of Systems     Objective:    BP 118/70 (BP Location: Left Arm, Patient Position: Sitting, Cuff Size: Large)   Pulse 76   Temp 97.8 F (36.6 C) (Oral)   Ht 6\' 1"  (1.854 m)   Wt 273 lb 8 oz (124.1 kg)   SpO2 96%   BMI 36.08 kg/m   Wt Readings from Last 3 Encounters:  08/29/18 273 lb 8 oz (124.1 kg)  08/02/18 250 lb (113.4 kg)  07/12/18 267 lb 8 oz (121.3 kg)    Physical Exam  Constitutional: He appears well-developed and well-nourished. No distress.  HENT:  Head: Normocephalic and atraumatic.  Right Ear: Hearing, tympanic membrane, external ear and ear canal normal.  Left Ear: Hearing, tympanic membrane, external ear and ear canal normal.  Nose: Mucosal edema present. No rhinorrhea. Right sinus exhibits maxillary sinus tenderness and frontal sinus tenderness. Left sinus exhibits no maxillary sinus tenderness  and no frontal sinus tenderness.  Mouth/Throat: Uvula is midline, oropharynx is clear and moist and mucous membranes are normal. No oropharyngeal exudate, posterior oropharyngeal edema, posterior oropharyngeal erythema or tonsillar abscesses.  Eyes: Pupils are equal, round, and reactive to light. Conjunctivae and EOM are normal. No scleral icterus.  Neck: Normal range of motion. Neck supple.  Cardiovascular: Normal rate, regular rhythm, normal heart sounds and intact distal pulses.  No murmur heard. Pulmonary/Chest: Effort normal and breath sounds normal. No respiratory distress. He has no wheezes. He has no rales.    Lymphadenopathy:    He has no cervical adenopathy.  Skin: Skin is warm and dry. No rash noted.  Nursing note and vitals reviewed.     Assessment & Plan:   Problem List Items Addressed This Visit    Severe obesity (BMI 35.0-35.9 with comorbidity) ([place])    Pt endorses ongoing healthy diet and lifestyle changes - minimized sugar in diet. Remains frustrated that weight not reflecting healthy changes. Encouraged to focus on other health benefits noted - good blood pressures and sugar control, feels clothes fit him better, dropping waist size etc.      Acute sinusitis - Primary    Anticipate bacterial given duration and progression of symptoms. Treat with augmentin course. Discussed NSAID use. Push fluids and rest.       Relevant Medications   amoxicillin-clavulanate (AUGMENTIN) 875-125 MG tablet       Meds ordered this encounter  Medications  . amoxicillin-clavulanate (AUGMENTIN) 875-125 MG tablet    Sig: Take 1 tablet by mouth 2 (two) times daily for 10 days.    Dispense:  20 tablet    Refill:  0   No orders of the defined types were placed in this encounter.   Follow up plan: Return if symptoms worsen or fail to improve.  Ria Bush, MD

## 2018-08-29 NOTE — Assessment & Plan Note (Signed)
Anticipate bacterial given duration and progression of symptoms. Treat with augmentin course. Discussed NSAID use. Push fluids and rest.

## 2018-11-02 ENCOUNTER — Ambulatory Visit: Payer: Medicare Other | Attending: Nurse Practitioner | Admitting: Nurse Practitioner

## 2018-11-02 ENCOUNTER — Encounter: Payer: Self-pay | Admitting: Nurse Practitioner

## 2018-11-02 VITALS — BP 136/101 | HR 92 | Temp 97.9°F | Resp 16 | Ht 74.0 in | Wt 280.0 lb

## 2018-11-02 DIAGNOSIS — Z79891 Long term (current) use of opiate analgesic: Secondary | ICD-10-CM | POA: Diagnosis not present

## 2018-11-02 DIAGNOSIS — G510 Bell's palsy: Secondary | ICD-10-CM | POA: Insufficient documentation

## 2018-11-02 DIAGNOSIS — M48061 Spinal stenosis, lumbar region without neurogenic claudication: Secondary | ICD-10-CM | POA: Diagnosis not present

## 2018-11-02 DIAGNOSIS — M4726 Other spondylosis with radiculopathy, lumbar region: Secondary | ICD-10-CM

## 2018-11-02 DIAGNOSIS — Z881 Allergy status to other antibiotic agents status: Secondary | ICD-10-CM | POA: Diagnosis not present

## 2018-11-02 DIAGNOSIS — M533 Sacrococcygeal disorders, not elsewhere classified: Secondary | ICD-10-CM

## 2018-11-02 DIAGNOSIS — E538 Deficiency of other specified B group vitamins: Secondary | ICD-10-CM | POA: Diagnosis not present

## 2018-11-02 DIAGNOSIS — F411 Generalized anxiety disorder: Secondary | ICD-10-CM | POA: Insufficient documentation

## 2018-11-02 DIAGNOSIS — Z5181 Encounter for therapeutic drug level monitoring: Secondary | ICD-10-CM | POA: Diagnosis not present

## 2018-11-02 DIAGNOSIS — Z6835 Body mass index (BMI) 35.0-35.9, adult: Secondary | ICD-10-CM | POA: Insufficient documentation

## 2018-11-02 DIAGNOSIS — I10 Essential (primary) hypertension: Secondary | ICD-10-CM | POA: Diagnosis not present

## 2018-11-02 DIAGNOSIS — Z87891 Personal history of nicotine dependence: Secondary | ICD-10-CM | POA: Insufficient documentation

## 2018-11-02 DIAGNOSIS — F329 Major depressive disorder, single episode, unspecified: Secondary | ICD-10-CM | POA: Insufficient documentation

## 2018-11-02 DIAGNOSIS — M1711 Unilateral primary osteoarthritis, right knee: Secondary | ICD-10-CM | POA: Insufficient documentation

## 2018-11-02 DIAGNOSIS — E119 Type 2 diabetes mellitus without complications: Secondary | ICD-10-CM | POA: Diagnosis not present

## 2018-11-02 DIAGNOSIS — M25551 Pain in right hip: Secondary | ICD-10-CM | POA: Diagnosis not present

## 2018-11-02 DIAGNOSIS — Z79899 Other long term (current) drug therapy: Secondary | ICD-10-CM | POA: Insufficient documentation

## 2018-11-02 DIAGNOSIS — R790 Abnormal level of blood mineral: Secondary | ICD-10-CM | POA: Diagnosis not present

## 2018-11-02 DIAGNOSIS — M5126 Other intervertebral disc displacement, lumbar region: Secondary | ICD-10-CM | POA: Diagnosis not present

## 2018-11-02 DIAGNOSIS — G8929 Other chronic pain: Secondary | ICD-10-CM | POA: Diagnosis not present

## 2018-11-02 DIAGNOSIS — G894 Chronic pain syndrome: Secondary | ICD-10-CM | POA: Diagnosis not present

## 2018-11-02 MED ORDER — OXYCODONE HCL 5 MG PO TABS: 5.0000 mg | ORAL_TABLET | Freq: Four times a day (QID) | ORAL | 0 refills | Status: DC | PRN

## 2018-11-02 NOTE — Progress Notes (Signed)
Patient's Name: Larry Goodwin  MRN: [id number]  Referring Provider: Ria Bush, MD  DOB: 12/06/1976  PCP: Ria Bush, MD  DOS: 11/02/2018  Note by: Vevelyn Francois NP  Service setting: Ambulatory outpatient  Specialty: Interventional Pain Management  Location: [facility] (AMB) Pain Management Facility    Patient type: Established    Primary Reason(s) for Visit: Encounter for prescription drug management. (Level of risk: moderate)  CC: Back Pain (entire back worse on the right)  HPI  Mr. Climer is a 42 y.o. year old, male patient, who comes today for a medication management evaluation. He has Acute sinusitis; Lumbar DDD (degenerative disc disease); HTN (hypertension); Bell's palsy; Severe obesity (BMI 35.0-35.9 with comorbidity) ([place]); Chronic pain syndrome; History of type 2 diabetes mellitus; History of tobacco abuse; Low magnesium levels; Vitamin D deficiency; Myofascial pain syndrome; Lumbar facet syndrome (Bilateral) (R>L); Chronic upper back pain (Location of Tertiary source of pain); Generalized anxiety disorder; Depression; Lumbar spondylosis with radiculopathy; Epidural fibrosis; Failed back surgical syndrome; Encounter for therapeutic drug level monitoring; Long term prescription opiate use; Long term current use of opiate analgesic; Opiate use (30 MME/Day); Chronic low back pain (Location of Primary Source of Pain) (Bilateral) (R>L); Chronic sacroiliac joint pain (Right); Abnormal MRI, lumbar spine;  Abnormal CT myelogram of the thoracic spine; Thoracic disc herniation; Bulge of lumbar disc without myelopathy; Vertebral body hemangioma; Disturbance of skin sensation; Cervical IVDD (intervertebral disc displacement); Cervical foraminal stenosis (C5-6 and C7-T1: Right); Cervical central spinal stenosis (C5-6); Thoracic IVDD (intervertebral disc displacement); Thoracic facet arthropathy; Thoracic facet syndrome ([place]); Lumbar IVDD (intervertebral disc displacement); Lumbar foraminal  stenosis (Right L4-5); Lumbar central spinal stenosis (L3-4 and L4-5); Chronic knee pain (Right); Osteoarthritis of knee (Right); Chronic hip pain (Location of Secondary source of pain) (Right); Chronic shoulder pain (Location of Tertiary source of pain) (Bilateral) (Right); Vitamin B12 deficiency; Internal nasal lesion; and Advanced care planning/counseling discussion on their problem list. His primarily concern today is the Back Pain (entire back worse on the right)  Pain Assessment: Location: Lower, Mid, Upper, Left, Right Back Radiating: travels into right hip and into the buttocks Onset: More than a month ago Duration: Chronic pain Quality: Discomfort, Constant, Dull, Nagging, Aching(when it flares up it is really bad) Severity: 3 /10 (subjective, self-reported pain score)  Note: Reported level is compatible with observation.                          Effect on ADL: difficult to get things done d/t the pain.  Timing: Constant Modifying factors: medications, laying down when it gets really bad BP: (!) 136/101  HR: 92  Mr. Burgen was last scheduled for an appointment on 08/02/2018 for medication management. During today's appointment we reviewed Mr. Hatchell's chronic pain status, as well as his outpatient medication regimen.  He admits that his pain is stable.  He denies any concerns today.  The patient  reports that he does not use drugs. His body mass index is 35.95 kg/m.  Further details on both, my assessment(s), as well as the proposed treatment plan, please see below.  Controlled Substance Pharmacotherapy Assessment REMS (Risk Evaluation and Mitigation Strategy)  Analgesic:Oxycodone IR 5 mg every 6 hours (20 mg/day) MME/day:30 mg/day.   Janett Billow, RN  11/02/2018  9:04 AM  Sign at close encounter Nursing Pain Medication Assessment:  Safety precautions to be maintained throughout the outpatient stay will include: orient to surroundings, keep bed in low position,  maintain call bell within reach at all times, provide assistance with transfer out of bed and ambulation.  Medication Inspection Compliance: Pill count conducted under aseptic conditions, in front of the patient. Neither the pills nor the bottle was removed from the patient's sight at any time. Once count was completed pills were immediately returned to the patient in their original bottle.  Medication: Oxycodone IR Pill/Patch Count: 42 of 130 pills remain Pill/Patch Appearance: Markings consistent with prescribed medication Bottle Appearance: Standard pharmacy container. Clearly labeled. Filled Date: 10 / 30 / 2019 Last Medication intake:  Today   Pharmacokinetics: Liberation and absorption (onset of action): WNL Distribution (time to peak effect): WNL Metabolism and excretion (duration of action): WNL         Pharmacodynamics: Desired effects: Analgesia: Mr. Coach reports >50% benefit. Functional ability: Patient reports that medication allows him to accomplish basic ADLs Clinically meaningful improvement in function (CMIF): Sustained CMIF goals met Perceived effectiveness: Described as relatively effective, allowing for increase in activities of daily living (ADL) Undesirable effects: Side-effects or Adverse reactions: None reported Monitoring: [place] PMP: Online review of the past 46-monthperiod conducted. Compliant with practice rules and regulations Last UDS on record: Summary  Date Value Ref Range Status  08/02/2018 FINAL  Final    Comment:    ==================================================================== TOXASSURE SELECT 13 (MW) ==================================================================== Test                             Result       Flag       Units Drug Present and Declared for Prescription Verification   Oxycodone                      1219         EXPECTED   ng/mg creat   Oxymorphone                    422          EXPECTED   ng/mg creat   Noroxycodone                    1353         EXPECTED   ng/mg creat   Noroxymorphone                 217          EXPECTED   ng/mg creat    Sources of oxycodone are scheduled prescription medications.    Oxymorphone, noroxycodone, and noroxymorphone are expected    metabolites of oxycodone. Oxymorphone is also available as a    scheduled prescription medication. ==================================================================== Test                      Result    Flag   Units      Ref Range   Creatinine              36               mg/dL      >=20 ==================================================================== Declared Medications:  The flagging and interpretation on this report are based on the  following declared medications.  Unexpected results may arise from  inaccuracies in the declared medications.  **Note: The testing scope of this panel includes these medications:  Oxycodone  **Note: The testing scope of this panel does not include following  reported medications:  Cyanocobalamin  Lisinopril  Magnesium Oxide  Vitamin D3 ==================================================================== For clinical consultation, please call [phone]. ====================================================================    UDS interpretation: Compliant          Medication Assessment Form: Reviewed. Patient indicates being compliant with therapy Treatment compliance: Compliant Risk Assessment Profile: Aberrant behavior: See prior evaluations. None observed or detected today Comorbid factors increasing risk of overdose: See prior notes. No additional risks detected today Opioid risk tool (ORT) (Total Score):   Personal History of Substance Abuse (SUD-Substance use disorder):  Alcohol:    Illegal Drugs:    Rx Drugs:    ORT Risk Level calculation:   Risk of substance use disorder (SUD): Low  ORT Scoring interpretation table:  Score <3 = Low Risk for SUD  Score between 4-7 = Moderate Risk for  SUD  Score >8 = High Risk for Opioid Abuse   Risk Mitigation Strategies:  Patient Counseling: Covered Patient-Prescriber Agreement (PPA): Present and active  Notification to other healthcare providers: Done  Pharmacologic Plan: No change in therapy, at this time.             Laboratory Chemistry  Inflammation Markers (CRP: Acute Phase) (ESR: Chronic Phase) Lab Results  Component Value Date   CRP 0.8 06/09/2016   ESRSEDRATE 6 06/09/2016                         Rheumatology Markers No results found for: RF, ANA, LABURIC, URICUR, LYMEIGGIGMAB, LYMEABIGMQN, HLAB27                      Renal Function Markers Lab Results  Component Value Date   BUN 16 07/12/2018   CREATININE 0.83 07/12/2018   GFRAA >60 06/09/2016   GFRNONAA >60 06/09/2016                             Hepatic Function Markers Lab Results  Component Value Date   AST 11 02/01/2017   ALT 10 02/01/2017   ALBUMIN 4.2 02/01/2017   ALKPHOS 49 02/01/2017   LIPASE 153 09/21/2012                        Electrolytes Lab Results  Component Value Date   NA 137 07/12/2018   K 4.3 07/12/2018   CL 101 07/12/2018   CALCIUM 9.4 07/12/2018   MG 1.9 07/12/2018                        Neuropathy Markers Lab Results  Component Value Date   VITAMINB12 736 07/12/2018   HGBA1C 5.3 07/12/2018                        CNS Tests No results found for: COLORCSF, APPEARCSF, RBCCOUNTCSF, WBCCSF, POLYSCSF, LYMPHSCSF, EOSCSF, PROTEINCSF, GLUCCSF, JCVIRUS, CSFOLI, IGGCSF                      Bone Pathology Markers Lab Results  Component Value Date   VD25OH 43.98 07/12/2018   25OHVITD1 31 06/09/2016   25OHVITD2 6.6 06/09/2016   25OHVITD3 24 06/09/2016                         Coagulation Parameters Lab Results  Component Value Date   PLT 240 08/03/2014  Cardiovascular Markers Lab Results  Component Value Date   CKTOTAL 46 07/10/2011   CKMB 1.4 07/10/2011   TROPONINI < 0.02 12/23/2011   HGB 14.9  08/03/2014   HCT 43.9 08/03/2014                         CA Markers No results found for: CEA, CA125, LABCA2                      Note: Lab results reviewed.  Recent Diagnostic Imaging Results  DG Foot Complete Right  PRIOR REPORT IMPORTED FROM AN EXTERNAL SYSTEM   PRIOR REPORT IMPORTED FROM THE SYNGO WORKFLOW SYSTEM   REASON FOR EXAM:    pain, trauma  COMMENTS:   May transport without cardiac monitor   PROCEDURE:     DXR - DXR FOOT RT COMPLETE W/OBLIQUES  - Mar 24 2012   8:54AM   RESULT:     No fracture, dislocation or other acute bony abnormality is  identified. There is noted deformity of the base of the fifth metatarsal  compatible with residual change from a prior fracture that has healed.   IMPRESSION:  1.     No acute bony abnormalities are identified.   Thank you for the opportunity to contribute to the care of your patient.     Complexity Note: Imaging results reviewed. Results shared with Mr. Poli, using Layman's terms.                         Meds   Current Outpatient Medications:  .  Cholecalciferol (VITAMIN D3) 2000 units capsule, Take 1 capsule (2,000 Units total) by mouth daily. (Patient taking differently: Take 4,000 Units by mouth daily. ), Disp: 90 capsule, Rfl: 0 .  lisinopril (PRINIVIL,ZESTRIL) 20 MG tablet, Take 2 tablets (40 mg total) by mouth daily., Disp: 90 tablet, Rfl: 3 .  lisinopril (PRINIVIL,ZESTRIL) 40 MG tablet, Take 40 mg by mouth daily., Disp: , Rfl:  .  [START ON 11/11/2018] oxyCODONE (OXY IR/ROXICODONE) 5 MG immediate release tablet, Take 1 tablet (5 mg total) by mouth every 6 (six) hours as needed for severe pain (May take one additional tab as needed)., Disp: 120 tablet, Rfl: 0 .  vitamin B-12 (CYANOCOBALAMIN) 1000 MCG tablet, Take 1,000 mcg by mouth daily., Disp: , Rfl:  .  Magnesium Oxide 500 MG CAPS, Take 1 capsule (500 mg total) by mouth daily., Disp: 90 capsule, Rfl: 0 .  [START ON 12/11/2018] oxyCODONE (OXY IR/ROXICODONE) 5 MG  immediate release tablet, Take 1 tablet (5 mg total) by mouth every 6 (six) hours as needed for severe pain (May take one additional tab as needed)., Disp: 120 tablet, Rfl: 0 .  [START ON 01/10/2019] oxyCODONE (OXY IR/ROXICODONE) 5 MG immediate release tablet, Take 1 tablet (5 mg total) by mouth every 6 (six) hours as needed for severe pain (May take one additional tab as needed)., Disp: 120 tablet, Rfl: 0  ROS  Constitutional: Denies any fever or chills Gastrointestinal: No reported hemesis, hematochezia, vomiting, or acute GI distress Musculoskeletal: Denies any acute onset joint swelling, redness, loss of ROM, or weakness Neurological: No reported episodes of acute onset apraxia, aphasia, dysarthria, agnosia, amnesia, paralysis, loss of coordination, or loss of consciousness  Allergies  Mr. Gervasi is allergic to levaquin [levofloxacin in d5w].  [place]  Drug: Mr. Guidotti  reports that he does not use drugs. Alcohol:  reports that he  does not drink alcohol. Tobacco:  reports that he quit smoking about 10 years ago. He has never used smokeless tobacco. Medical:  has a past medical history of Bell's palsy (2015), Chronic pain syndrome, DDD (degenerative disc disease), lumbar, HTN (hypertension), and Morbid obesity ([place]) (08/16/2014). Surgical: Mr. Stillings  has a past surgical history that includes Back surgery (2012) and Cholecystectomy. Family: family history includes COPD in his mother; Hypertension in his other.  Constitutional Exam  General appearance: Well nourished, well developed, and well hydrated. In no apparent acute distress Vitals:   11/02/18 0858  BP: (!) 136/101  Pulse: 92  Resp: 16  Temp: 97.9 F (36.6 C)  TempSrc: Oral  SpO2: 100%  Weight: 280 lb (127 kg)  Height: '6\' 2"'  (1.88 m)  Psych/Mental status: Alert, oriented x 3 (person, place, & time)       Eyes: PERLA Respiratory: No evidence of acute respiratory distress  Cervical Spine Area Exam  Skin & Axial Inspection:  No masses, redness, edema, swelling, or associated skin lesions Alignment: Symmetrical Functional ROM: Unrestricted ROM      Stability: No instability detected Muscle Tone/Strength: Functionally intact. No obvious neuro-muscular anomalies detected. Sensory (Neurological): Unimpaired Palpation: No palpable anomalies              Upper Extremity (UE) Exam    Side: Right upper extremity  Side: Left upper extremity  Skin & Extremity Inspection: Skin color, temperature, and hair growth are WNL. No peripheral edema or cyanosis. No masses, redness, swelling, asymmetry, or associated skin lesions. No contractures.  Skin & Extremity Inspection: Skin color, temperature, and hair growth are WNL. No peripheral edema or cyanosis. No masses, redness, swelling, asymmetry, or associated skin lesions. No contractures.  Functional ROM: Unrestricted ROM          Functional ROM: Unrestricted ROM          Muscle Tone/Strength: Functionally intact. No obvious neuro-muscular anomalies detected.  Muscle Tone/Strength: Functionally intact. No obvious neuro-muscular anomalies detected.  Sensory (Neurological): Unimpaired          Sensory (Neurological): Unimpaired          Palpation: No palpable anomalies              Palpation: No palpable anomalies              Provocative Test(s):  Phalen's test: deferred Tinel's test: deferred Apley's scratch test (touch opposite shoulder):  Action 1 (Across chest): deferred Action 2 (Overhead): deferred Action 3 (LB reach): deferred   Provocative Test(s):  Phalen's test: deferred Tinel's test: deferred Apley's scratch test (touch opposite shoulder):  Action 1 (Across chest): deferred Action 2 (Overhead): deferred Action 3 (LB reach): deferred    Thoracic Spine Area Exam  Skin & Axial Inspection: No masses, redness, or swelling Alignment: Symmetrical Functional ROM: Unrestricted ROM Stability: No instability detected Muscle Tone/Strength: Functionally intact. No  obvious neuro-muscular anomalies detected. Sensory (Neurological): Unimpaired Muscle strength & Tone: No palpable anomalies  Lumbar Spine Area Exam  Skin & Axial Inspection: Well healed scar from previous spine surgery detected Alignment: Symmetrical Functional ROM: Unrestricted ROM       Stability: No instability detected Muscle Tone/Strength: Functionally intact. No obvious neuro-muscular anomalies detected. Sensory (Neurological): Unimpaired Palpation: No palpable anomalies        Gait & Posture Assessment  Ambulation: Unassisted Gait: Relatively normal for age and body habitus Posture: WNL   Lower Extremity Exam    Side: Right lower extremity  Side: Left lower  extremity  Stability: No instability observed          Stability: No instability observed          Skin & Extremity Inspection: Skin color, temperature, and hair growth are WNL. No peripheral edema or cyanosis. No masses, redness, swelling, asymmetry, or associated skin lesions. No contractures.  Skin & Extremity Inspection: Skin color, temperature, and hair growth are WNL. No peripheral edema or cyanosis. No masses, redness, swelling, asymmetry, or associated skin lesions. No contractures.  Functional ROM: Unrestricted ROM                  Functional ROM: Unrestricted ROM                  Muscle Tone/Strength: Functionally intact. No obvious neuro-muscular anomalies detected.  Muscle Tone/Strength: Functionally intact. No obvious neuro-muscular anomalies detected.  Sensory (Neurological): Unimpaired medial portion of foot (L4)  Sensory (Neurological): Unimpaired medial portion of foot (L4)  DTR: Patellar: deferred today Achilles: deferred today Plantar: deferred today  DTR: Patellar: deferred today Achilles: deferred today Plantar: deferred today  Palpation: No palpable anomalies  Palpation: No palpable anomalies   Assessment  Primary Diagnosis & Pertinent Problem List: The primary encounter diagnosis was Lumbar  spondylosis with radiculopathy. Diagnoses of Chronic sacroiliac joint pain (Right), Chronic hip pain (Location of Secondary source of pain) (Right), Chronic pain syndrome, and Low magnesium levels were also pertinent to this visit.  Status Diagnosis  Controlled Controlled Controlled 1. Lumbar spondylosis with radiculopathy   2. Chronic sacroiliac joint pain (Right)   3. Chronic hip pain (Location of Secondary source of pain) (Right)   4. Chronic pain syndrome   5. Low magnesium levels     Problems updated and reviewed during this visit: No problems updated. Plan of Care  Pharmacotherapy (Medications Ordered): Meds ordered this encounter  Medications  . oxyCODONE (OXY IR/ROXICODONE) 5 MG immediate release tablet    Sig: Take 1 tablet (5 mg total) by mouth every 6 (six) hours as needed for severe pain (May take one additional tab as needed).    Dispense:  120 tablet    Refill:  0    Do not place this medication, or any other prescription from our practice, on "Automatic Refill". Patient may have prescription filled one day early if pharmacy is closed on scheduled refill date.    Order Specific Question:   Supervising Provider    Answer:   Milinda Pointer [phone]  . oxyCODONE (OXY IR/ROXICODONE) 5 MG immediate release tablet    Sig: Take 1 tablet (5 mg total) by mouth every 6 (six) hours as needed for severe pain (May take one additional tab as needed).    Dispense:  120 tablet    Refill:  0    Do not place this medication, or any other prescription from our practice, on "Automatic Refill". Patient may have prescription filled one day early if pharmacy is closed on scheduled refill date.    Order Specific Question:   Supervising Provider    Answer:   Milinda Pointer [phone]  . oxyCODONE (OXY IR/ROXICODONE) 5 MG immediate release tablet    Sig: Take 1 tablet (5 mg total) by mouth every 6 (six) hours as needed for severe pain (May take one additional tab as needed).    Dispense:   120 tablet    Refill:  0    Do not place this medication, or any other prescription from our practice, on "Automatic Refill". Patient may have  prescription filled one day early if pharmacy is closed on scheduled refill date.    Order Specific Question:   Supervising Provider    Answer:   Milinda Pointer [[id number]]   New Prescriptions   No medications on file   Medications administered today: Natale Lay had no medications administered during this visit. Lab-work, procedure(s), and/or referral(s): No orders of the defined types were placed in this encounter.  Imaging and/or referral(s): None Interventional therapies: Planned, scheduled, and/or pending: Not at this time.   Considering:  Diagnostic right-sided cervical epidural steroid injection  Diagnostic thoracic epidural steroid injection  Diagnostic bilateral thoracic facet block  Possible bilateral thoracic facet radiofrequency ablation  Diagnostic bilateral lumbar facet block  Possible bilateral lumbar facet radiofrequency ablation  Diagnostic right sacroiliac joint block  Possible right sacroiliac joint radiofrequency ablation  Diagnostic caudal epidural steroid injection and epidurogram  Diagnostic right-sided L4-5 transforaminal epidural steroid injection  Diagnostic right intra-articular knee injection  Diagnostic right genicular nerve block  Possible right genicular radiofrequency ablation    Palliative PRN treatment(s):  Palliativeright-sided cervical epidural steroid injection  Palliativethoracic epidural steroid injection  Palliativebilateral thoracic facet block  Palliativebilateral lumbar facet block  Palliativeright sacroiliac joint block  Palliativecaudal epidural steroid injection and epidurogram  Palliativeright-sided L4-5 transforaminal epidural steroid injection  Palliativeright intra-articular knee injection  Palliativeright genicular nerve block    Provider-requested  follow-up: Return in about 3 months (around 02/02/2019) for MedMgmt.  Future Appointments  Date Time Provider [place]  07/17/2019  2:30 PM Eustace Pen, LPN [facility]  05/16/9527  3:00 PM Ria Bush, MD [facility]   Primary Care Physician: Ria Bush, MD Location: [facility] Note by: Vevelyn Francois NP Date: 11/02/2018; Time: 9:36 AM  Pain Score Disclaimer: We use the NRS-11 scale. This is a self-reported, subjective measurement of pain severity with only modest accuracy. It is used primarily to identify changes within a particular patient. It must be understood that outpatient pain scales are significantly less accurate that those used for research, where they can be applied under ideal controlled circumstances with minimal exposure to variables. In reality, the score is likely to be a combination of pain intensity and pain affect, where pain affect describes the degree of emotional arousal or changes in action readiness caused by the sensory experience of pain. Factors such as social and work situation, setting, emotional state, anxiety levels, expectation, and prior pain experience may influence pain perception and show large inter-individual differences that may also be affected by time variables.  Patient instructions provided during this appointment: Patient Instructions   __You have been given 3 electronic prescriptions for Oxycodone 42m to last until 02/09/2018.  __________________________________________________________________________________________  Medication Rules  Purpose: To inform patients, and their family members, of our rules and regulations.  Applies to: All patients receiving prescriptions (written or electronic).  Pharmacy of record: Pharmacy where electronic prescriptions will be sent. If written prescriptions are taken to a different pharmacy, please inform the nursing staff. The pharmacy listed in the  electronic medical record should be the one where you would like electronic prescriptions to be sent.  Electronic prescriptions: In compliance with the [place] of 2017 (Session [phone], effective December 14, 2018, all controlled substances must be electronically prescribed. Calling prescriptions to the pharmacy will cease to exist.  Prescription refills: Only during scheduled appointments. Applies to all prescriptions.  NOTE: The following applies primarily to controlled substances (Opioid* Pain  Medications).   Patient's responsibilities: 1. Pain Pills: Bring all pain pills to every appointment (except for procedure appointments). 2. Pill Bottles: Bring pills in original pharmacy bottle. Always bring the newest bottle. Bring bottle, even if empty. 3. Medication refills: You are responsible for knowing and keeping track of what medications you take and those you need refilled. The day before your appointment: write a list of all prescriptions that need to be refilled. The day of the appointment: give the list to the admitting nurse. Prescriptions will be written only during appointments. If you forget a medication: it will not be "Called in", "Faxed", or "electronically sent". You will need to get another appointment to get these prescribed. No early refills. Do not call asking to have your prescription filled early. 4. Prescription Accuracy: You are responsible for carefully inspecting your prescriptions before leaving our office. Have the discharge nurse carefully go over each prescription with you, before taking them home. Make sure that your name is accurately spelled, that your address is correct. Check the name and dose of your medication to make sure it is accurate. Check the number of pills, and the written instructions to make sure they are clear and accurate. Make sure that you are given enough medication to last until your next  medication refill appointment. 5. Taking Medication: Take medication as prescribed. When it comes to controlled substances, taking less pills or less frequently than prescribed is permitted and encouraged. Never take more pills than instructed. Never take medication more frequently than prescribed.  6. Inform other Doctors: Always inform, all of your healthcare providers, of all the medications you take. 7. Pain Medication from other Providers: You are not allowed to accept any additional pain medication from any other Doctor or Healthcare provider. There are two exceptions to this rule. (see below) In the event that you require additional pain medication, you are responsible for notifying us, as stated below. 8. Medication Agreement: You are responsible for carefully reading and following our Medication Agreement. This must be signed before receiving any prescriptions from our practice. Safely store a copy of your signed Agreement. Violations to the Agreement will result in no further prescriptions. (Additional copies of our Medication Agreement are available upon request.) 9. Laws, Rules, & Regulations: All patients are expected to follow all Federal and [employer], [employer], Rules, [employer]. Ignorance of the Laws does not constitute a valid excuse. The use of any illegal substances is prohibited. 10. Adopted CDC guidelines & recommendations: Target dosing levels will be at or below 60 MME/day. Use of benzodiazepines** is not recommended.  Exceptions: There are only two exceptions to the rule of not receiving pain medications from other Healthcare Providers. 1. Exception #1 (Emergencies): In the event of an emergency (i.e.: accident requiring emergency care), you are allowed to receive additional pain medication. However, you are responsible for: As soon as you are able, call our office [phone], at any time of the day or night, and leave a message stating your name, the date and nature of  the emergency, and the name and dose of the medication prescribed. In the event that your call is answered by a member of our staff, make sure to document and save the date, time, and the name of the person that took your information.  2. Exception #2 (Planned Surgery): In the event that you are scheduled by another doctor or dentist to have any type of surgery or procedure, you are allowed (for a period no longer  than 30 days), to receive additional pain medication, for the acute post-op pain. However, in this case, you are responsible for picking up a copy of our "Post-op Pain Management for Surgeons" handout, and giving it to your surgeon or dentist. This document is available at our office, and does not require an appointment to obtain it. Simply go to our office during business hours (Monday-Thursday from 8:00 AM to 4:00 PM) (Friday 8:00 AM to 12:00 Noon) or if you have a scheduled appointment with [place], prior to your surgery, and ask for it by name. In addition, you will need to provide [place] with your name, name of your surgeon, type of surgery, and date of procedure or surgery.  *Opioid medications include: morphine, codeine, oxycodone, oxymorphone, hydrocodone, hydromorphone, meperidine, tramadol, tapentadol, buprenorphine, fentanyl, methadone. **Benzodiazepine medications include: diazepam (Valium), alprazolam (Xanax), clonazepam (Klonopine), lorazepam (Ativan), clorazepate (Tranxene), chlordiazepoxide (Librium), estazolam (Prosom), oxazepam (Serax), temazepam (Restoril), triazolam (Halcion) (Last updated: 02/10/2018) ____________________________________________________________________________________________    BMI Assessment: Estimated body mass index is 35.95 kg/m as calculated from the following:   Height as of this encounter: '6\' 2"'  (1.88 m).   Weight as of this encounter: 280 lb (127 kg).  BMI interpretation table: BMI level Category Range association with higher incidence of chronic pain   <18 kg/m2 Underweight   18.5-24.9 kg/m2 Ideal body weight   25-29.9 kg/m2 Overweight Increased incidence by 20%  30-34.9 kg/m2 Obese (Class I) Increased incidence by 68%  35-39.9 kg/m2 Severe obesity (Class II) Increased incidence by 136%  >40 kg/m2 Extreme obesity (Class III) Increased incidence by 254%   Patient's current BMI Ideal Body weight  Body mass index is 35.95 kg/m. Ideal body weight: 82.2 kg (181 lb 3.5 oz) Adjusted ideal body weight: 100.1 kg (220 lb 11.7 oz)   BMI Readings from Last 4 Encounters:  11/02/18 35.95 kg/m  08/29/18 36.08 kg/m  08/02/18 32.10 kg/m  07/12/18 35.29 kg/m   Wt Readings from Last 4 Encounters:  11/02/18 280 lb (127 kg)  08/29/18 273 lb 8 oz (124.1 kg)  08/02/18 250 lb (113.4 kg)  07/12/18 267 lb 8 oz (121.3 kg)

## 2018-11-02 NOTE — Patient Instructions (Addendum)
__You have been given 3 electronic prescriptions for Oxycodone 5mg  to last until 02/09/2018.  __________________________________________________________________________________________  Medication Rules  Purpose: To inform patients, and their family members, of our rules and regulations.  Applies to: All patients receiving prescriptions (written or electronic).  Pharmacy of record: Pharmacy where electronic prescriptions will be sent. If written prescriptions are taken to a different pharmacy, please inform the nursing staff. The pharmacy listed in the electronic medical record should be the one where you would like electronic prescriptions to be sent.  Electronic prescriptions: In compliance with the [place] (STOP) Act of 2017 (Session Lanny Cramp [phone]), effective December 14, 2018, all controlled substances must be electronically prescribed. Calling prescriptions to the pharmacy will cease to exist.  Prescription refills: Only during scheduled appointments. Applies to all prescriptions.  NOTE: The following applies primarily to controlled substances (Opioid* Pain Medications).   Patient's responsibilities: 1. Pain Pills: Bring all pain pills to every appointment (except for procedure appointments). 2. Pill Bottles: Bring pills in original pharmacy bottle. Always bring the newest bottle. Bring bottle, even if empty. 3. Medication refills: You are responsible for knowing and keeping track of what medications you take and those you need refilled. The day before your appointment: write a list of all prescriptions that need to be refilled. The day of the appointment: give the list to the admitting nurse. Prescriptions will be written only during appointments. If you forget a medication: it will not be "Called in", "Faxed", or "electronically sent". You will need to get another appointment to get these prescribed. No early refills. Do not call asking to  have your prescription filled early. 4. Prescription Accuracy: You are responsible for carefully inspecting your prescriptions before leaving our office. Have the discharge nurse carefully go over each prescription with you, before taking them home. Make sure that your name is accurately spelled, that your address is correct. Check the name and dose of your medication to make sure it is accurate. Check the number of pills, and the written instructions to make sure they are clear and accurate. Make sure that you are given enough medication to last until your next medication refill appointment. 5. Taking Medication: Take medication as prescribed. When it comes to controlled substances, taking less pills or less frequently than prescribed is permitted and encouraged. Never take more pills than instructed. Never take medication more frequently than prescribed.  6. Inform other Doctors: Always inform, all of your healthcare providers, of all the medications you take. 7. Pain Medication from other Providers: You are not allowed to accept any additional pain medication from any other Doctor or Healthcare provider. There are two exceptions to this rule. (see below) In the event that you require additional pain medication, you are responsible for notifying us, as stated below. 8. Medication Agreement: You are responsible for carefully reading and following our Medication Agreement. This must be signed before receiving any prescriptions from our practice. Safely store a copy of your signed Agreement. Violations to the Agreement will result in no further prescriptions. (Additional copies of our Medication Agreement are available upon request.) 9. Laws, Rules, & Regulations: All patients are expected to follow all Federal and [employer], [employer], Rules, [employer]. Ignorance of the Laws does not constitute a valid excuse. The use of any illegal substances is prohibited. 10. Adopted CDC guidelines & recommendations:  Target dosing levels will be at or below 60 MME/day. Use of benzodiazepines** is not recommended.  Exceptions: There are only  two exceptions to the rule of not receiving pain medications from other Healthcare Providers. 1. Exception #1 (Emergencies): In the event of an emergency (i.e.: accident requiring emergency care), you are allowed to receive additional pain medication. However, you are responsible for: As soon as you are able, call our office [phone], at any time of the day or night, and leave a message stating your name, the date and nature of the emergency, and the name and dose of the medication prescribed. In the event that your call is answered by a member of our staff, make sure to document and save the date, time, and the name of the person that took your information.  2. Exception #2 (Planned Surgery): In the event that you are scheduled by another doctor or dentist to have any type of surgery or procedure, you are allowed (for a period no longer than 30 days), to receive additional pain medication, for the acute post-op pain. However, in this case, you are responsible for picking up a copy of our "Post-op Pain Management for Surgeons" handout, and giving it to your surgeon or dentist. This document is available at our office, and does not require an appointment to obtain it. Simply go to our office during business hours (Monday-Thursday from 8:00 AM to 4:00 PM) (Friday 8:00 AM to 12:00 Noon) or if you have a scheduled appointment with [place], prior to your surgery, and ask for it by name. In addition, you will need to provide [place] with your name, name of your surgeon, type of surgery, and date of procedure or surgery.  *Opioid medications include: morphine, codeine, oxycodone, oxymorphone, hydrocodone, hydromorphone, meperidine, tramadol, tapentadol, buprenorphine, fentanyl, methadone. **Benzodiazepine medications include: diazepam (Valium), alprazolam (Xanax), clonazepam (Klonopine),  lorazepam (Ativan), clorazepate (Tranxene), chlordiazepoxide (Librium), estazolam (Prosom), oxazepam (Serax), temazepam (Restoril), triazolam (Halcion) (Last updated: 02/10/2018) ____________________________________________________________________________________________    BMI Assessment: Estimated body mass index is 35.95 kg/m as calculated from the following:   Height as of this encounter: 6\' 2"  (1.88 m).   Weight as of this encounter: 280 lb (127 kg).  BMI interpretation table: BMI level Category Range association with higher incidence of chronic pain  <18 kg/m2 Underweight   18.5-24.9 kg/m2 Ideal body weight   25-29.9 kg/m2 Overweight Increased incidence by 20%  30-34.9 kg/m2 Obese (Class I) Increased incidence by 68%  35-39.9 kg/m2 Severe obesity (Class II) Increased incidence by 136%  >40 kg/m2 Extreme obesity (Class III) Increased incidence by 254%   Patient's current BMI Ideal Body weight  Body mass index is 35.95 kg/m. Ideal body weight: 82.2 kg (181 lb 3.5 oz) Adjusted ideal body weight: 100.1 kg (220 lb 11.7 oz)   BMI Readings from Last 4 Encounters:  11/02/18 35.95 kg/m  08/29/18 36.08 kg/m  08/02/18 32.10 kg/m  07/12/18 35.29 kg/m   Wt Readings from Last 4 Encounters:  11/02/18 280 lb (127 kg)  08/29/18 273 lb 8 oz (124.1 kg)  08/02/18 250 lb (113.4 kg)  07/12/18 267 lb 8 oz (121.3 kg)

## 2018-11-02 NOTE — Progress Notes (Signed)
Nursing Pain Medication Assessment:  Safety precautions to be maintained throughout the outpatient stay will include: orient to surroundings, keep bed in low position, maintain call bell within reach at all times, provide assistance with transfer out of bed and ambulation.  Medication Inspection Compliance: Pill count conducted under aseptic conditions, in front of the patient. Neither the pills nor the bottle was removed from the patient's sight at any time. Once count was completed pills were immediately returned to the patient in their original bottle.  Medication: Oxycodone IR Pill/Patch Count: 42 of 130 pills remain Pill/Patch Appearance: Markings consistent with prescribed medication Bottle Appearance: Standard pharmacy container. Clearly labeled. Filled Date: 10 / 30 / 2019 Last Medication intake:  Today

## 2018-12-06 ENCOUNTER — Ambulatory Visit: Payer: Medicare Other | Admitting: Family Medicine

## 2018-12-06 ENCOUNTER — Encounter: Payer: Self-pay | Admitting: Family Medicine

## 2018-12-06 ENCOUNTER — Ambulatory Visit (INDEPENDENT_AMBULATORY_CARE_PROVIDER_SITE_OTHER): Payer: Medicare Other | Admitting: Family Medicine

## 2018-12-06 DIAGNOSIS — J019 Acute sinusitis, unspecified: Secondary | ICD-10-CM | POA: Diagnosis not present

## 2018-12-06 DIAGNOSIS — E559 Vitamin D deficiency, unspecified: Secondary | ICD-10-CM | POA: Diagnosis not present

## 2018-12-06 MED ORDER — VITAMIN D3 50 MCG (2000 UT) PO CAPS: 4000.0000 [IU] | ORAL_CAPSULE | Freq: Every day | ORAL | Status: DC

## 2018-12-06 MED ORDER — AMOXICILLIN-POT CLAVULANATE 875-125 MG PO TABS: 1.0000 | ORAL_TABLET | Freq: Two times a day (BID) | ORAL | 0 refills | Status: DC

## 2018-12-06 NOTE — Assessment & Plan Note (Signed)
Nontoxic, okay for outpatient f/u.  Start augmentin.  Rest and fluids. He agrees.   See avs.   He will check BP at home when feeling better and update Dr. Darnell Level if still elevated.

## 2018-12-06 NOTE — Patient Instructions (Signed)
Check on your BP at home when you are feeling better and update Dr. Darnell Level if still elevated.  Start augmentin.  Rest and fluids.  Use warm compresses on your face if needed to help with pressure and pain.  Update [place] as needed.  Take care.  Glad to see you.

## 2018-12-06 NOTE — Progress Notes (Signed)
I asked him to check his BP at home when feeling better and then update PCP if persistently elevated.    duration of symptoms: 1.5-2 weeks.  Rhinorrhea: yes, discolored.   congestion:yes ear pain: yes sore throat: no Cough: no Myalgias: no Tired: yes.    No fevers.   other concerns: B maxillary pain.  He doesn't typically have HA.  Per HPI unless specifically indicated in ROS section   Meds, vitals, and allergies reviewed.   GEN: nad, alert and oriented HEENT: mucous membranes moist, TM w/o erythema, nasal epithelium injected, OP with cobblestoning NECK: supple w/ tender LN x1 on L side of neck CV: rrr. PULM: ctab, no inc wob ABD: soft, +bs EXT: no edema Sinuses ttp x4

## 2019-01-25 ENCOUNTER — Other Ambulatory Visit: Payer: Self-pay | Admitting: Family Medicine

## 2019-02-06 ENCOUNTER — Ambulatory Visit: Payer: Medicare Other | Attending: Nurse Practitioner | Admitting: Nurse Practitioner

## 2019-02-06 ENCOUNTER — Other Ambulatory Visit: Payer: Self-pay

## 2019-02-06 ENCOUNTER — Encounter: Payer: Self-pay | Admitting: Nurse Practitioner

## 2019-02-06 VITALS — BP 161/110 | HR 91 | Temp 97.8°F | Ht 74.0 in | Wt 280.0 lb

## 2019-02-06 DIAGNOSIS — Z79899 Other long term (current) drug therapy: Secondary | ICD-10-CM | POA: Insufficient documentation

## 2019-02-06 DIAGNOSIS — G8929 Other chronic pain: Secondary | ICD-10-CM | POA: Insufficient documentation

## 2019-02-06 DIAGNOSIS — R790 Abnormal level of blood mineral: Secondary | ICD-10-CM | POA: Diagnosis not present

## 2019-02-06 DIAGNOSIS — M4726 Other spondylosis with radiculopathy, lumbar region: Secondary | ICD-10-CM

## 2019-02-06 DIAGNOSIS — Z79891 Long term (current) use of opiate analgesic: Secondary | ICD-10-CM | POA: Diagnosis not present

## 2019-02-06 DIAGNOSIS — Z789 Other specified health status: Secondary | ICD-10-CM | POA: Diagnosis not present

## 2019-02-06 DIAGNOSIS — M533 Sacrococcygeal disorders, not elsewhere classified: Secondary | ICD-10-CM | POA: Insufficient documentation

## 2019-02-06 DIAGNOSIS — G894 Chronic pain syndrome: Secondary | ICD-10-CM | POA: Diagnosis not present

## 2019-02-06 DIAGNOSIS — M899 Disorder of bone, unspecified: Secondary | ICD-10-CM | POA: Diagnosis not present

## 2019-02-06 MED ORDER — OXYCODONE HCL 5 MG PO TABS: 5.0000 mg | ORAL_TABLET | Freq: Four times a day (QID) | ORAL | 0 refills | Status: DC | PRN

## 2019-02-06 NOTE — Patient Instructions (Signed)
____________________________________________________________________________________________  Medication Rules  Purpose: To inform patients, and their family members, of our rules and regulations.  Applies to: All patients receiving prescriptions (written or electronic).  Pharmacy of record: Pharmacy where electronic prescriptions will be sent. If written prescriptions are taken to a different pharmacy, please inform the nursing staff. The pharmacy listed in the electronic medical record should be the one where you would like electronic prescriptions to be sent.  Electronic prescriptions: In compliance with the [place] Opio[employer] Misuse Prevention (STOP) Act of 2017 (Session Law 2017-74/H243), effective December 14, 2018, all controlled substances must be electronically prescribed. Calling prescriptions to the pharmacy will cease to exist.  Prescription refills: Only during scheduled appointments. Applies to all prescriptions.  NOTE: The following applies primarily to controlled substances (Opioid* Pain Medications).   Patient's responsibilities: 1. Pain Pills: Bring all pain pills to every appointment (except for procedure appointments). 2. Pill Bottles: Bring pills in original pharmacy bottle. Always bring the newest bottle. Bring bottle, even if empty. 3. Medication refills: You are responsible for knowing and keeping track of what medications you take and those you need refilled. The day before your appointment: write a list of all prescriptions that need to be refilled. The day of the appointment: give the list to the admitting nurse. Prescriptions will be written only during appointments. No prescriptions will be written on procedure days. If you forget a medication: it will not be "Called in", "Faxed", or "electronically sent". You will need to get another appointment to get these prescribed. No early refills. Do not call asking to have your prescription filled  early. 4. Prescription Accuracy: You are responsible for carefully inspecting your prescriptions before leaving our office. Have the discharge nurse carefully go over each prescription with you, before taking them home. Make sure that your name is accurately spelled, that your address is correct. Check the name and dose of your medication to make sure it is accurate. Check the number of pills, and the written instructions to make sure they are clear and accurate. Make sure that you are given enough medication to last until your next medication refill appointment. 5. Taking Medication: Take medication as prescribed. When it comes to controlled substances, taking less pills or less frequently than prescribed is permitted and encouraged. Never take more pills than instructed. Never take medication more frequently than prescribed.  6. Inform other Doctors: Always inform, all of your healthcare providers, of all the medications you take. 7. Pain Medication from other Providers: You are not allowed to accept any additional pain medication from any other Doctor or Healthcare provider. There are two exceptions to this rule. (see below) In the event that you require additional pain medication, you are responsible for notifying us, as stated below. 8. Medication Agreement: You are responsible for carefully reading and following our Medication Agreement. This must be signed before receiving any prescriptions from our practice. Safely store a copy of your signed Agreement. Violations to the Agreement will result in no further prescriptions. (Additional copies of our Medication Agreement are available upon request.) 9. Laws, Rules, & Regulations: All patients are expected to follow all Federal and State Laws, Statutes, Rules, & Regulations. Ignorance of the Laws does not constitute a valid excuse. The use of any illegal substances is prohibited. 10. Adopted CDC guidelines & recommendations: Target dosing levels will be  at or below 60 MME/day. Use of benzodiazepines** is not recommended.  Exceptions: There are only two exceptions to the rule of not   receiving pain medications from other Healthcare Providers. 1. Exception #1 (Emergencies): In the event of an emergency (i.e.: accident requiring emergency care), you are allowed to receive additional pain medication. However, you are responsible for: As soon as you are able, call our office [phone], at any time of the day or night, and leave a message stating your name, the date and nature of the emergency, and the name and dose of the medication prescribed. In the event that your call is answered by a member of our staff, make sure to document and save the date, time, and the name of the person that took your information.  2. Exception #2 (Planned Surgery): In the event that you are scheduled by another doctor or dentist to have any type of surgery or procedure, you are allowed (for a period no longer than 30 days), to receive additional pain medication, for the acute post-op pain. However, in this case, you are responsible for picking up a copy of our "Post-op Pain Management for Surgeons" handout, and giving it to your surgeon or dentist. This document is available at our office, and does not require an appointment to obtain it. Simply go to our office during business hours (Monday-Thursday from 8:00 AM to 4:00 PM) (Friday 8:00 AM to 12:00 Noon) or if you have a scheduled appointment with us, prior to your surgery, and ask for it by name. In addition, you will need to provide us with your name, name of your surgeon, type of surgery, and date of procedure or surgery.  *Opioid medications include: morphine, codeine, oxycodone, oxymorphone, hydrocodone, hydromorphone, meperidine, tramadol, tapentadol, buprenorphine, fentanyl, methadone. **Benzodiazepine medications include: diazepam (Valium), alprazolam (Xanax), clonazepam (Klonopine), lorazepam (Ativan), clorazepate  (Tranxene), chlordiazepoxide (Librium), estazolam (Prosom), oxazepam (Serax), temazepam (Restoril), triazolam (Halcion) (Last updated: 02/10/2018) ____________________________________________________________________________________________    

## 2019-02-06 NOTE — Progress Notes (Signed)
Nursing Pain Medication Assessment:  Safety precautions to be maintained throughout the outpatient stay will include: orient to surroundings, keep bed in low position, maintain call bell within reach at all times, provide assistance with transfer out of bed and ambulation.  Medication Inspection Compliance: Pill count conducted under aseptic conditions, in front of the patient. Neither the pills nor the bottle was removed from the patient's sight at any time. Once count was completed pills were immediately returned to the patient in their original bottle.  Medication: Oxycodone IR Pill/Patch Count: 11 of 120 pills remain Pill/Patch Appearance: Markings consistent with prescribed medication Bottle Appearance: Standard pharmacy container. Clearly labeled. Filled Date: 1 / 29 / 2020 Last Medication intake:  Today

## 2019-02-06 NOTE — Progress Notes (Signed)
Patient's Name: Larry Goodwin  MRN: [id number]  Referring Provider: Ria Bush, MD  DOB: 03-28-1976  PCP: Ria Bush, MD  DOS: 02/06/2019  Note by: Dionisio David, NP  Service setting: Ambulatory outpatient  Specialty: Interventional Pain Management  Location: [facility] (AMB) Pain Management Facility    Patient type: Established   HPI  Reason for Visit: [name] is a 43 y.o. year old, male patient, who comes today with a chief complaint of Back Pain Last Appointment: His last appointment at our practice was on 11/02/2018. I last saw him on 11/02/2018.  Pain Assessment: Today, [name] describes the severity of the Chronic pain as a 3 /10. He indicates the location/referral of the pain to be Back Lower/pain radiates down right side. Onset was: More than a month ago. The quality of pain is described as Constant, Nagging, Throbbing. Temporal description, or timing of pain is: Constant. Possible modifying factors: medications and rest. Mr. Revelo's  height is _0  (1.88 m) and weight is 280 lb (127 kg). His temperature is 97.8 F (36.6 C). His blood pressure is 161/110 (abnormal) and his pulse is 91. His oxygen saturation is 99%.  He denies any changes in his pain. His pain goes into the hips and buttock. He denies any side effects of his current regimen. He is doing well overall.   Controlled Substance Pharmacotherapy Assessment REMS (Risk Evaluation and Mitigation Strategy)  Analgesic:Oxycodone IR 5 mg every 6 hours (20 mg/day) MME/day:30 mg/day.  Chauncey Fischer, RN  02/06/2019  9:32 AM  Sign when Signing Visit Nursing Pain Medication Assessment:  Safety precautions to be maintained throughout the outpatient stay will include: orient to surroundings, keep bed in low position, maintain call bell within reach at all times, provide assistance with transfer out of bed and ambulation.  Medication Inspection Compliance: Pill count conducted under aseptic conditions, in front  of the patient. Neither the pills nor the bottle was removed from the patient's sight at any time. Once count was completed pills were immediately returned to the patient in their original bottle.  Medication: Oxycodone IR Pill/Patch Count: 11 of 120 pills remain Pill/Patch Appearance: Markings consistent with prescribed medication Bottle Appearance: Standard pharmacy container. Clearly labeled. Filled Date: 1 / 58 / 2020 Last Medication intake:  Today   Pharmacokinetics: Liberation and absorption (onset of action): WNL Distribution (time to peak effect): WNL Metabolism and excretion (duration of action): WNL         Pharmacodynamics: Desired effects: Analgesia: [name] reports >50% benefit. Functional ability: Patient reports that medication allows him to accomplish basic ADLs Clinically meaningful improvement in function (CMIF): Sustained CMIF goals met Perceived effectiveness: Described as relatively effective, allowing for increase in activities of daily living (ADL) Undesirable effects: Side-effects or Adverse reactions: None reported Monitoring: [place] PMP: Online review of the past 11-monthperiod conducted. Compliant with practice rules and regulations Last UDS on record: Summary  Date Value Ref Range Status  08/02/2018 FINAL  Final    Comment:    ==================================================================== TOXASSURE SELECT 13 (MW) ==================================================================== Test                             Result       Flag       Units Drug Present and Declared for Prescription Verification   Oxycodone  1219         EXPECTED   ng/mg creat   Oxymorphone                    422          EXPECTED   ng/mg creat   Noroxycodone                   1353         EXPECTED   ng/mg creat   Noroxymorphone                 217          EXPECTED   ng/mg creat    Sources of oxycodone are scheduled prescription medications.     Oxymorphone, noroxycodone, and noroxymorphone are expected    metabolites of oxycodone. Oxymorphone is also available as a    scheduled prescription medication. ==================================================================== Test                      Result    Flag   Units      Ref Range   Creatinine              36               mg/dL      >=20 ==================================================================== Declared Medications:  The flagging and interpretation on this report are based on the  following declared medications.  Unexpected results may arise from  inaccuracies in the declared medications.  **Note: The testing scope of this panel includes these medications:  Oxycodone  **Note: The testing scope of this panel does not include following  reported medications:  Cyanocobalamin  Lisinopril  Magnesium Oxide  Vitamin D3 ==================================================================== For clinical consultation, please call [phone]. ====================================================================    UDS interpretation: Compliant          Medication Assessment Form: Reviewed. Patient indicates being compliant with therapy Treatment compliance: Compliant Risk Assessment Profile: Aberrant behavior: See initial evaluations. None observed or detected today Comorbid factors increasing risk of overdose: See initial evaluation. No additional risks detected today Opioid risk tool (ORT):  Opioid Risk  02/06/2019  Alcohol 0  Illegal Drugs 0  Rx Drugs 0  Alcohol 0  Illegal Drugs 0  Rx Drugs 0  Age between 16-45 years  0  History of Preadolescent Sexual Abuse 0  Psychological Disease 0  Depression 0  Opioid Risk Tool Scoring 0  Opioid Risk Interpretation Low Risk    ORT Scoring interpretation table:  Score <3 = Low Risk for SUD  Score between 4-7 = Moderate Risk for SUD  Score >8 = High Risk for Opioid Abuse   Risk of substance use disorder (SUD):  Low  Risk Mitigation Strategies:  Patient Counseling: Covered Patient-Prescriber Agreement (PPA): Present and active  Notification to other healthcare providers: Done  Pharmacologic Plan: No change in therapy, at this time.             ROS  Constitutional: Denies any fever or chills Gastrointestinal: No reported hemesis, hematochezia, vomiting, or acute GI distress Musculoskeletal: Denies any acute onset joint swelling, redness, loss of ROM, or weakness Neurological: No reported episodes of acute onset apraxia, aphasia, dysarthria, agnosia, amnesia, paralysis, loss of coordination, or loss of consciousness  Medication Review  Magnesium Oxide, Vitamin D3, lisinopril, oxyCODONE, and vitamin B-12  History Review  Allergy: Mr. Talarico is allergic to levaquin [levofloxacin in d5w]. Drug: Mr. Telfair  reports  no history of drug use. Alcohol:  reports no history of alcohol use. Tobacco:  reports that he quit smoking about 11 years ago. He has never used smokeless tobacco. Social: Mr. Ruddy  reports that he quit smoking about 11 years ago. He has never used smokeless tobacco. He reports that he does not drink alcohol or use drugs. Medical:  has a past medical history of Bell's palsy (2015), Chronic pain syndrome, DDD (degenerative disc disease), lumbar, HTN (hypertension), and Morbid obesity ([place]) (08/16/2014). Surgical: Mr. Seymour  has a past surgical history that includes Back surgery (2012) and Cholecystectomy. Family: family history includes COPD in his mother; Hypertension in an other family member. Problem List: Mr. 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  Lab Review  Kidney Function Lab Results  Component Value Date   BUN 16 07/12/2018   CREATININE 0.83 07/12/2018   GFRAA >60 06/09/2016   GFRNONAA >60 06/09/2016  Liver Function Lab Results  Component Value Date   AST 11 02/01/2017   ALT 10 02/01/2017   ALBUMIN 4.2 02/01/2017  Note: Above Lab results reviewed.  Imaging Review  Note: Reviewed        Physical Exam  General appearance: Well nourished, well developed, and well hydrated. In no apparent acute distress Mental status: Alert, oriented x 3 (person, place, & time)       Respiratory: No evidence of acute respiratory distress Eyes: PERLA Vitals: BP (!) 161/110   Pulse 91   Temp 97.8 F (36.6 C)   Ht _0  (1.88 m)   Wt 280 lb (127 kg)   SpO2 99%   BMI 35.95 kg/m  BMI: Estimated body mass index is 35.95 kg/m as calculated from the following:   Height as of this encounter: _1  (1.88 m).   Weight as of this encounter: 280 lb (127 kg). Ideal: Ideal body weight: 82.2 kg (181 lb 3.5 oz) Adjusted ideal body weight: 100.1 kg (220 lb 11.7 oz) Lumbar Spine Area Exam  Skin & Axial Inspection: No masses, redness, or swelling Alignment: Symmetrical Functional ROM: Unrestricted ROM       Stability: No instability detected Muscle Tone/Strength: Functionally intact. No obvious neuro-muscular anomalies  detected. Sensory (Neurological): Unimpaired Palpation: Uncomfortable        Lower Extremity Exam    Side: Right lower extremity  Side: Left lower extremity  Stability: No instability observed          Stability: No instability observed          Skin & Extremity Inspection: Skin color, temperature, and hair growth are WNL. No peripheral edema or cyanosis. No masses, redness, swelling, asymmetry, or associated skin lesions. No contractures.  Skin & Extremity Inspection: Skin color, temperature, and hair growth are WNL. No  peripheral edema or cyanosis. No masses, redness, swelling, asymmetry, or associated skin lesions. No contractures.  Functional ROM: Unrestricted ROM                  Functional ROM: Unrestricted ROM                  Muscle Tone/Strength: Functionally intact. No obvious neuro-muscular anomalies detected.  Muscle Tone/Strength: Functionally intact. No obvious neuro-muscular anomalies detected.  Sensory (Neurological): Referred pain pattern        Sensory (Neurological): Unimpaired            Palpation: No palpable anomalies  Palpation: No palpable anomalies   Assessment   Status Diagnosis  Controlled Controlled Controlled 1. Lumbar spondylosis with radiculopathy   2. Chronic sacroiliac joint pain (Right)   3. Chronic pain syndrome   4. Low magnesium levels   5. Pharmacologic therapy   6. Disorder of skeletal system   7. Problems influencing health status   8. Long term prescription opiate use      Updated Problems: Problem  Pharmacologic Therapy  Disorder of Skeletal System  Problems Influencing Health Status    Plan of Care  Pharmacotherapy (Medications Ordered): Meds ordered this encounter  Medications  . oxyCODONE (OXY IR/ROXICODONE) 5 MG immediate release tablet    Sig: Take 1 tablet (5 mg total) by mouth every 6 (six) hours as needed for up to 30 days for severe pain (May take one additional tab as needed).    Dispense:  120 tablet    Refill:  0     Do not place this medication, or any other prescription from our practice, on "Automatic Refill". Patient may have prescription filled one day early if pharmacy is closed on scheduled refill date.    Order Specific Question:   Supervising Provider    Answer:   Milinda Pointer [phone]  . oxyCODONE (OXY IR/ROXICODONE) 5 MG immediate release tablet    Sig: Take 1 tablet (5 mg total) by mouth every 6 (six) hours as needed for up to 30 days for severe pain (May take one additional tab as needed).    Dispense:  120 tablet    Refill:  0    Do not place this medication, or any other prescription from our practice, on "Automatic Refill". Patient may have prescription filled one day early if pharmacy is closed on scheduled refill date.    Order Specific Question:   Supervising Provider    Answer:   Milinda Pointer [phone]  . oxyCODONE (OXY IR/ROXICODONE) 5 MG immediate release tablet    Sig: Take 1 tablet (5 mg total) by mouth every 6 (six) hours as needed for up to 30 days for severe pain (May take one additional tab as needed).    Dispense:  120 tablet    Refill:  0    Do not place this medication, or any other prescription from our practice, on "Automatic Refill". Patient may have prescription filled one day early if pharmacy is closed on scheduled refill date.    Order Specific Question:   Supervising Provider    Answer:   Milinda Pointer [[id number]]   Administered today: Natale Lay had no medications administered during this visit.  Orders:  Orders Placed This Encounter  Procedures  . ToxASSURE Select 13 (MW), Urine    Volume: 30 ml(s). Minimum 3 ml of urine is needed. Document temperature of fresh sample. Indications: Long term (current) use of opiate analgesic (Z79.891)  .  Comp. Metabolic Panel (12)    With GFR. Indications: Chronic Pain Syndrome (G89.4) & Pharmacotherapy (A07.622)    Order Specific Question:   Has the patient fasted?    Answer:   No  . Magnesium     Indication: Pharmacologic therapy (Q33.354)  . Vitamin B12    Indication: Pharmacologic therapy (T62.563).  . Sedimentation rate    Indication: Disorder of skeletal system (M89.9)  . 25-Hydroxyvitamin D Lcms D2+D3    Indication: Disorder of skeletal system (M89.9).  . C-reactive protein    Indication: Problems influencing health status (Z78.9)   Interventional options: Planned follow-up:   Not at this time Plan: Return in about 3 months (around 05/07/2019) for MedMgmt.   Considering:  Diagnostic right-sided cervical epidural steroid injection  Diagnostic thoracic epidural steroid injection  Diagnostic bilateral thoracic facet block  Possible bilateral thoracic facet radiofrequency ablation  Diagnostic bilateral lumbar facet block  Possible bilateral lumbar facet radiofrequency ablation  Diagnostic right sacroiliac joint block  Possible right sacroiliac joint radiofrequency ablation  Diagnostic caudal epidural steroid injection and epidurogram  Diagnostic right-sided L4-5 transforaminal epidural steroid injection  Diagnostic right intra-articular knee injection  Diagnostic right genicular nerve block  Possible right genicular radiofrequency ablation    Palliative PRN treatment(s):  Palliativeright-sided cervical epidural steroid injection  Palliativethoracic epidural steroid injection  Palliativebilateral thoracic facet block  Palliativebilateral lumbar facet block  Palliativeright sacroiliac joint block  Palliativecaudal epidural steroid injection and epidurogram  Palliativeright-sided L4-5 transforaminal epidural steroid injection  Palliativeright intra-articular knee injection  Palliativeright genicular nerve block     Note by: Dionisio David, NP Date: 02/06/2019; Time: 10:06 AM

## 2019-02-09 LAB — TOXASSURE SELECT 13 (MW), URINE

## 2019-02-12 LAB — 25-HYDROXY VITAMIN D LCMS D2+D3
25-Hydroxy, Vitamin D-2: <1 ng/mL
25-Hydroxy, Vitamin D-3: 34 ng/mL
25-Hydroxy, Vitamin D: 35 ng/mL

## 2019-02-12 LAB — MAGNESIUM: Magnesium: 1.8 mg/dL (ref 1.6–2.3)

## 2019-02-12 LAB — COMP. METABOLIC PANEL (12)
ALK PHOS: 49 IU/L (ref 39–117)
AST: 16 IU/L (ref 0–40)
Albumin/Globulin Ratio: 1.7 (ref 1.2–2.2)
Albumin: 4.1 g/dL (ref 4.0–5.0)
BUN/Creatinine Ratio: 11 (ref 9–20)
BUN: 8 mg/dL (ref 6–24)
Bilirubin Total: 0.8 mg/dL (ref 0.0–1.2)
Calcium: 8.7 mg/dL (ref 8.7–10.2)
Chloride: 98 mmol/L (ref 96–106)
Creatinine, Ser: 0.76 mg/dL (ref 0.76–1.27)
GFR calc Af Amer: 129 mL/min/{1.73_m2} (ref >59)
GFR calc non Af Amer: 112 mL/min/{1.73_m2} (ref >59)
GLUCOSE: 185 mg/dL — AB (ref 65–99)
Globulin, Total: 2.4 g/dL (ref 1.5–4.5)
Potassium: 4.5 mmol/L (ref 3.5–5.2)
Sodium: 136 mmol/L (ref 134–144)
Total Protein: 6.5 g/dL (ref 6.0–8.5)

## 2019-02-12 LAB — SEDIMENTATION RATE: Sed Rate: 6 mm/hr (ref 0–15)

## 2019-02-12 LAB — C-REACTIVE PROTEIN: CRP: 9 mg/L (ref 0–10)

## 2019-02-12 LAB — VITAMIN B12: Vitamin B-12: 664 pg/mL (ref 232–1245)

## 2019-03-27 ENCOUNTER — Other Ambulatory Visit: Payer: Self-pay

## 2019-03-27 ENCOUNTER — Ambulatory Visit (INDEPENDENT_AMBULATORY_CARE_PROVIDER_SITE_OTHER): Payer: Medicare Other | Admitting: Family Medicine

## 2019-03-27 ENCOUNTER — Encounter: Payer: Self-pay | Admitting: Family Medicine

## 2019-03-27 VITALS — Temp 98.2°F | Ht 73.0 in | Wt 270.0 lb

## 2019-03-27 DIAGNOSIS — J019 Acute sinusitis, unspecified: Secondary | ICD-10-CM

## 2019-03-27 MED ORDER — FLUTICASONE PROPIONATE 50 MCG/ACT NA SUSP: 2.0000 | Freq: Every day | NASAL | 1 refills | Status: DC

## 2019-03-27 MED ORDER — AMOXICILLIN-POT CLAVULANATE 875-125 MG PO TABS: 1.0000 | ORAL_TABLET | Freq: Two times a day (BID) | ORAL | 0 refills | Status: AC

## 2019-03-27 NOTE — Assessment & Plan Note (Signed)
Will cover for bacterial sinusitis given duration and progression of symptoms.  Rx augmentin course, start flonase - anticipate significant allergic component to symptoms. Update if not improving with treatment. Pt agrees with plan.

## 2019-03-27 NOTE — Progress Notes (Signed)
Virtual visit completed through [employer]. Due to national recommendations of social distancing due to [place] 19, a virtual visit is felt to be most appropriate for this patient at this time.   Patient location: home Provider location: [place] [facility], office If any vitals were documented, they were collected by patient at home unless specified below.    Temp 98.2 F (36.8 C) (Oral)   Ht 6\' 1"  (1.854 m)   Wt 270 lb (122.5 kg)   BMI 35.62 kg/m    CC: sinus pain/pressure Subjective:    Patient ID: Larry Goodwin, male    DOB: 1976-12-06, 43 y.o.   MRN: [id number]  HPI: Larry Goodwin is a 43 y.o. male presenting on 03/27/2019 for Sinus Problem (C/o sinus pressure, HA, facial pain and ear pressure. Sxs started about 2 wks ago. )   1-2 wk h/o bilateral maxillary facial pressure, head congestion, occipital head pressure. Ear pain/pressure. Initial improvement then again worsened with outdoor exposure. + PNDrainage. Rhinorrhea.   Known seasonal allergies.  Doesn't take anything for allergies.  No fevers/chills, cough, tooth pain, dyspnea or wheezing, ST.  May have started after playing ball with son - exposed to pollen.   Last sinus infection 11/2018.      Relevant past medical, surgical, family and social history reviewed and updated as indicated. Interim medical history since our last visit reviewed. Allergies and medications reviewed and updated. Outpatient Medications Prior to Visit  Medication Sig Dispense Refill  . Cholecalciferol (VITAMIN D3) 50 MCG (2000 UT) capsule Take 2 capsules (4,000 Units total) by mouth daily.    Marland Kitchen lisinopril (PRINIVIL,ZESTRIL) 40 MG tablet TAKE 1 TABLET BY MOUTH EVERY DAY 90 tablet 1  . Magnesium Oxide 500 MG CAPS Take 1 capsule (500 mg total) by mouth daily. 90 capsule 0  . [START ON 04/10/2019] oxyCODONE (OXY IR/ROXICODONE) 5 MG immediate release tablet Take 1 tablet (5 mg total) by mouth every 6 (six) hours as needed for up to 30 days  for severe pain (May take one additional tab as needed). 120 tablet 0  . oxyCODONE (OXY IR/ROXICODONE) 5 MG immediate release tablet Take 1 tablet (5 mg total) by mouth every 6 (six) hours as needed for up to 30 days for severe pain (May take one additional tab as needed). 120 tablet 0  . vitamin B-12 (CYANOCOBALAMIN) 1000 MCG tablet Take 1,000 mcg by mouth daily.    Marland Kitchen oxyCODONE (OXY IR/ROXICODONE) 5 MG immediate release tablet Take 1 tablet (5 mg total) by mouth every 6 (six) hours as needed for up to 30 days for severe pain (May take one additional tab as needed). 120 tablet 0   No facility-administered medications prior to visit.      Per HPI unless specifically indicated in ROS section below Review of Systems Objective:    Temp 98.2 F (36.8 C) (Oral)   Ht 6\' 1"  (1.854 m)   Wt 270 lb (122.5 kg)   BMI 35.62 kg/m   Wt Readings from Last 3 Encounters:  03/27/19 270 lb (122.5 kg)  02/06/19 280 lb (127 kg)  11/02/18 280 lb (127 kg)     Physical exam: Gen: alert, NAD, not ill appearing Pulm: speaks in complete sentences without increased work of breathing Psych: normal mood, normal thought content      Assessment & Plan:   Problem List Items Addressed This Visit    Acute sinusitis - Primary    Will cover for bacterial sinusitis given duration and  progression of symptoms.  Rx augmentin course, start flonase - anticipate significant allergic component to symptoms. Update if not improving with treatment. Pt agrees with plan.       Relevant Medications   amoxicillin-clavulanate (AUGMENTIN) 875-125 MG tablet   fluticasone (FLONASE) 50 MCG/ACT nasal spray       Meds ordered this encounter  Medications  . amoxicillin-clavulanate (AUGMENTIN) 875-125 MG tablet    Sig: Take 1 tablet by mouth 2 (two) times daily for 10 days.    Dispense:  20 tablet    Refill:  0  . fluticasone (FLONASE) 50 MCG/ACT nasal spray    Sig: Place 2 sprays into both nostrils daily.    Dispense:  16 g     Refill:  1   No orders of the defined types were placed in this encounter.   Follow up plan: No follow-ups on file.  Larry Bush, MD

## 2019-05-02 ENCOUNTER — Ambulatory Visit: Payer: Medicare Other | Attending: Nurse Practitioner | Admitting: Nurse Practitioner

## 2019-05-02 ENCOUNTER — Other Ambulatory Visit: Payer: Self-pay

## 2019-05-02 DIAGNOSIS — M533 Sacrococcygeal disorders, not elsewhere classified: Secondary | ICD-10-CM | POA: Diagnosis not present

## 2019-05-02 DIAGNOSIS — R790 Abnormal level of blood mineral: Secondary | ICD-10-CM | POA: Diagnosis not present

## 2019-05-02 DIAGNOSIS — G8929 Other chronic pain: Secondary | ICD-10-CM | POA: Diagnosis not present

## 2019-05-02 DIAGNOSIS — G894 Chronic pain syndrome: Secondary | ICD-10-CM

## 2019-05-02 DIAGNOSIS — M4726 Other spondylosis with radiculopathy, lumbar region: Secondary | ICD-10-CM

## 2019-05-02 MED ORDER — OXYCODONE HCL 5 MG PO TABS: 5.0000 mg | ORAL_TABLET | Freq: Four times a day (QID) | ORAL | 0 refills | Status: DC | PRN

## 2019-05-02 NOTE — Progress Notes (Signed)
Pain Management Encounter Note - Virtual Visit via Telephone Telehealth (real-time audio visits between healthcare provider and patient).  Patient's Phone No. & Preferred Pharmacy:  [phone] (home); [phone] (mobile); (Preferred) [phone]  [id number] [address], [address] [place] [address] Phone: [phone] Fax: [phone]   Pre-screening note:  Our staff contacted Mr. Bain and offered him an "in person", "face-to-face" appointment versus a telephone encounter. He indicated preferring the telephone encounter, at this time.  Reason for Virtual Visit: COVID-19*  Social distancing based on CDC and AMA recommendations.   I contacted Natale Lay on 05/02/2019 at 10:35 AM by telephone and clearly identified myself as Dionisio David, NP. I verified that I was speaking with the correct person using two identifiers (Name and date of birth: 02/11/1976).  Advanced Informed Consent I sought verbal advanced consent from Natale Lay for telemedicine interactions and virtual visit. I informed Mr. Daft of the security and privacy concerns, risks, and limitations associated with performing an evaluation and management service by telephone. I also informed Mr. Ghan of the availability of "in person" appointments and I informed him of the possibility of a patient responsible charge related to this service. Mr. Mcqueen expressed understanding and agreed to proceed.   Historic Elements   [name] is a 43 y.o. year old, male patient evaluated today after his last encounter by our practice on 02/06/2019. Mr. Lafavor  has a past medical history of Bell's palsy (2015), Chronic pain syndrome, DDD (degenerative disc disease), lumbar, HTN (hypertension), and Morbid obesity ([place]) (08/16/2014). He also  has a past surgical history that includes Back surgery (2012) and Cholecystectomy. Mr. Essner has a current medication list which  includes the following prescription(s): vitamin d3, fluticasone, lisinopril, magnesium oxide, oxycodone, oxycodone, oxycodone, and vitamin b-12. He  reports that he quit smoking about 11 years ago. He has never used smokeless tobacco. He reports that he does not drink alcohol or use drugs. Mr. Prosser is allergic to levaquin [levofloxacin in d5w].   HPI  I last saw him on 02/06/2019. He is being evaluated for medication management. He has 3/10 in lower back pain. He has right buttock pain. He denies any buttock. He denies any numbness, tingling or weakness. He denies any changes. He denies any concerns or questions.   Pharmacotherapy Assessment  Analgesic:Oxycodone IR 5 mg every 6 hours (20 mg/day) MME/day:30 mg/day.   Monitoring: Pharmacotherapy: No side-effects or adverse reactions reported. [place] PMP: PDMP reviewed during this encounter.       Compliance: No problems identified. Plan: Refer to "POC".  Review of recent tests  DG Foot Complete Right  PRIOR REPORT IMPORTED FROM AN EXTERNAL SYSTEM   PRIOR REPORT IMPORTED FROM THE SYNGO WORKFLOW SYSTEM   REASON FOR EXAM:    pain, trauma  COMMENTS:   May transport without cardiac monitor   PROCEDURE:     DXR - DXR FOOT RT COMPLETE W/OBLIQUES  - Mar 24 2012   8:54AM   RESULT:     No fracture, dislocation or other acute bony abnormality is  identified. There is noted deformity of the base of the fifth metatarsal  compatible with residual change from a prior fracture that has healed.   IMPRESSION:  1.     No acute bony abnormalities are identified.   Thank you for the opportunity to contribute to the care of your patient.      Office Visit on 02/06/2019  Component Date Value  Ref Range Status  . Summary 02/06/2019 FINAL   Final   Comment: ==================================================================== TOXASSURE SELECT 13 (MW) ==================================================================== Test                              Result       Flag       Units Drug Present and Declared for Prescription Verification   Oxycodone                      722          EXPECTED   ng/mg creat   Oxymorphone                    576          EXPECTED   ng/mg creat   Noroxycodone                   921          EXPECTED   ng/mg creat   Noroxymorphone                 116          EXPECTED   ng/mg creat    Sources of oxycodone are scheduled prescription medications.    Oxymorphone, noroxycodone, and noroxymorphone are expected    metabolites of oxycodone. Oxymorphone is also available as a    scheduled prescription medication. ==================================================================== Test                      Result    Flag   Units      Ref Range   Creatinine              109              mg/dL      >=20 ======                          ============================================================== Declared Medications:  The flagging and interpretation on this report are based on the  following declared medications.  Unexpected results may arise from  inaccuracies in the declared medications.  **Note: The testing scope of this panel includes these medications:  Oxycodone  **Note: The testing scope of this panel does not include following  reported medications:  Cholecalciferol  Cyanocobalamin  Lisinopril  Magnesium Oxide ==================================================================== For clinical consultation, please call [phone]. ====================================================================   . Glucose 02/06/2019 185* 65 - 99 mg/dL Final  . BUN 02/06/2019 8  6 - 24 mg/dL Final  . Creatinine, Ser 02/06/2019 0.76  0.76 - 1.27 mg/dL Final  . GFR calc non Af Amer 02/06/2019 112  >59 mL/min/1.73 Final  . GFR calc Af Amer 02/06/2019 129  >59 mL/min/1.73 Final  . BUN/Creatinine Ratio 02/06/2019 11  9 - 20 Final  . Sodium 02/06/2019 136  134 - 144 mmol/L Final  . Potassium 02/06/2019 4.5  3.5 - 5.2  mmol/L Final  . Chloride 02/06/2019 98  96 - 106 mmol/L Final  . Calcium 02/06/2019 8.7  8.7 - 10.2 mg/dL Final  . Total Protein 02/06/2019 6.5  6.0 - 8.5 g/dL Final  . Albumin 02/06/2019 4.1  4.0 - 5.0 g/dL Final                 **Please note reference interval change**  . Globulin, Total 02/06/2019 2.4  1.5 -  4.5 g/dL Final  . Albumin/Globulin Ratio 02/06/2019 1.7  1.2 - 2.2 Final  . Bilirubin Total 02/06/2019 0.8  0.0 - 1.2 mg/dL Final  . Alkaline Phosphatase 02/06/2019 49  39 - 117 IU/L Final  . AST 02/06/2019 16  0 - 40 IU/L Final  . Magnesium 02/06/2019 1.8  1.6 - 2.3 mg/dL Final  . Vitamin B-12 02/06/2019 664  232 - 1,245 pg/mL Final  . Sed Rate 02/06/2019 6  0 - 15 mm/hr Final  . 25-Hydroxy, Vitamin D 02/06/2019 35  ng/mL Final   Comment: Reference Range: All Ages: Target levels 30 - 100   . 25-Hydroxy, Vitamin D-2 02/06/2019 <1.0  ng/mL Final  . 25-Hydroxy, Vitamin D-3 02/06/2019 34  ng/mL Final  . CRP 02/06/2019 9  0 - 10 mg/L Final   Assessment  The primary encounter diagnosis was Lumbar spondylosis with radiculopathy. Diagnoses of Low magnesium levels, Chronic pain syndrome, and Chronic sacroiliac joint pain (Right) were also pertinent to this visit.  Plan of Care  I am having Natale Lay maintain his vitamin B-12, Magnesium Oxide, Vitamin D3, lisinopril, oxyCODONE, oxyCODONE, oxyCODONE, and fluticasone.  Pharmacotherapy (Medications Ordered): No orders of the defined types were placed in this encounter.  Orders:  No orders of the defined types were placed in this encounter.  Follow-up plan:   Return in about 3 months (around 08/02/2019) for MedMgmt.   I discussed the assessment and treatment plan with the patient. The patient was provided an opportunity to ask questions and all were answered. The patient agreed with the plan and demonstrated an understanding of the instructions.  Patient advised to call back or seek an in-person evaluation if the symptoms or  condition worsens.  Total duration of non-face-to-face encounter: 8 minutes.  Note by: Dionisio David, NP Date: 05/02/2019; Time: 10:35 AM  Disclaimer:  * Given the special circumstances of the COVID-19 pandemic, the federal government has announced that the Office for Civil Rights (OCR) will exercise its enforcement discretion and will not impose penalties on physicians using telehealth in the event of noncompliance with regulatory requirements under the [place] (HIPAA) in connection with the good faith provision of telehealth during the HFGBM-21 national public health emergency. ([place])

## 2019-05-02 NOTE — Patient Instructions (Signed)
____________________________________________________________________________________________  Medication Rules  Purpose: To inform patients, and their family members, of our rules and regulations.  Applies to: All patients receiving prescriptions (written or electronic).  Pharmacy of record: Pharmacy where electronic prescriptions will be sent. If written prescriptions are taken to a different pharmacy, please inform the nursing staff. The pharmacy listed in the electronic medical record should be the one where you would like electronic prescriptions to be sent.  Electronic prescriptions: In compliance with the [place] Strengthen Opioid Misuse Prevention (STOP) Act of 2017 (Session Law 2017-74/H243), effective December 14, 2018, all controlled substances must be electronically prescribed. Calling prescriptions to the pharmacy will cease to exist.  Prescription refills: Only during scheduled appointments. Applies to all prescriptions.  NOTE: The following applies primarily to controlled substances (Opioid* Pain Medications).   Patient's responsibilities: 1. Pain Pills: Bring all pain pills to every appointment (except for procedure appointments). 2. Pill Bottles: Bring pills in original pharmacy bottle. Always bring the newest bottle. Bring bottle, even if empty. 3. Medication refills: You are responsible for knowing and keeping track of what medications you take and those you need refilled. The day before your appointment: write a list of all prescriptions that need to be refilled. The day of the appointment: give the list to the admitting nurse. Prescriptions will be written only during appointments. No prescriptions will be written on procedure days. If you forget a medication: it will not be "Called in", "Faxed", or "electronically sent". You will need to get another appointment to get these prescribed. No early refills. Do not call asking to have your prescription filled  early. 4. Prescription Accuracy: You are responsible for carefully inspecting your prescriptions before leaving our office. Have the discharge nurse carefully go over each prescription with you, before taking them home. Make sure that your name is accurately spelled, that your address is correct. Check the name and dose of your medication to make sure it is accurate. Check the number of pills, and the written instructions to make sure they are clear and accurate. Make sure that you are given enough medication to last until your next medication refill appointment. 5. Taking Medication: Take medication as prescribed. When it comes to controlled substances, taking less pills or less frequently than prescribed is permitted and encouraged. Never take more pills than instructed. Never take medication more frequently than prescribed.  6. Inform other Doctors: Always inform, all of your healthcare providers, of all the medications you take. 7. Pain Medication from other Providers: You are not allowed to accept any additional pain medication from any other Doctor or Healthcare provider. There are two exceptions to this rule. (see below) In the event that you require additional pain medication, you are responsible for notifying us, as stated below. 8. Medication Agreement: You are responsible for carefully reading and following our Medication Agreement. This must be signed before receiving any prescriptions from our practice. Safely store a copy of your signed Agreement. Violations to the Agreement will result in no further prescriptions. (Additional copies of our Medication Agreement are available upon request.) 9. Laws, Rules, & Regulations: All patients are expected to follow all Federal and State Laws, Statutes, Rules, & Regulations. Ignorance of the Laws does not constitute a valid excuse. The use of any illegal substances is prohibited. 10. Adopted CDC guidelines & recommendations: Target dosing levels will be  at or below 60 MME/day. Use of benzodiazepines** is not recommended.  Exceptions: There are only two exceptions to the rule of not   receiving pain medications from other Healthcare Providers. 1. Exception #1 (Emergencies): In the event of an emergency (i.e.: accident requiring emergency care), you are allowed to receive additional pain medication. However, you are responsible for: As soon as you are able, call our office [phone], at any time of the day or night, and leave a message stating your name, the date and nature of the emergency, and the name and dose of the medication prescribed. In the event that your call is answered by a member of our staff, make sure to document and save the date, time, and the name of the person that took your information.  2. Exception #2 (Planned Surgery): In the event that you are scheduled by another doctor or dentist to have any type of surgery or procedure, you are allowed (for a period no longer than 30 days), to receive additional pain medication, for the acute post-op pain. However, in this case, you are responsible for picking up a copy of our "Post-op Pain Management for Surgeons" handout, and giving it to your surgeon or dentist. This document is available at our office, and does not require an appointment to obtain it. Simply go to our office during business hours (Monday-Thursday from 8:00 AM to 4:00 PM) (Friday 8:00 AM to 12:00 Noon) or if you have a scheduled appointment with us, prior to your surgery, and ask for it by name. In addition, you will need to provide us with your name, name of your surgeon, type of surgery, and date of procedure or surgery.  *Opioid medications include: morphine, codeine, oxycodone, oxymorphone, hydrocodone, hydromorphone, meperidine, tramadol, tapentadol, buprenorphine, fentanyl, methadone. **Benzodiazepine medications include: diazepam (Valium), alprazolam (Xanax), clonazepam (Klonopine), lorazepam (Ativan), clorazepate  (Tranxene), chlordiazepoxide (Librium), estazolam (Prosom), oxazepam (Serax), temazepam (Restoril), triazolam (Halcion) (Last updated: 02/10/2018) ____________________________________________________________________________________________    

## 2019-05-03 ENCOUNTER — Ambulatory Visit: Payer: Medicare Other | Admitting: Family Medicine

## 2019-05-04 ENCOUNTER — Ambulatory Visit (INDEPENDENT_AMBULATORY_CARE_PROVIDER_SITE_OTHER): Payer: Medicare Other | Admitting: Family Medicine

## 2019-05-04 ENCOUNTER — Encounter: Payer: Self-pay | Admitting: Family Medicine

## 2019-05-04 VITALS — Temp 98.3°F | Ht 73.0 in

## 2019-05-04 DIAGNOSIS — R194 Change in bowel habit: Secondary | ICD-10-CM

## 2019-05-04 DIAGNOSIS — R1013 Epigastric pain: Secondary | ICD-10-CM | POA: Insufficient documentation

## 2019-05-04 NOTE — Patient Instructions (Signed)
I think GI symptoms are coming from antibiotic use.  Take probiotic - activia yogurt 1-2 times daily for the next week. Do bland diet - chicken broth, jello, rice and grilled chicken.  Once stomach is feeling better, restart fiber in the diet to keep bowels regular (cooked vegetables, fruits, beans, whole grains).  For now, avoid gas producing foods (beans, onions, celery, carrots, raisins, bananas, apricots, prunes, brussel sprouts, wheat germ, pretzels) Continue drinking water. Avoid lactose containing foods (dairy products) for next 1-2 weeks

## 2019-05-04 NOTE — Progress Notes (Signed)
Virtual visit completed through [employer]. Due to national recommendations of social distancing due to COVID-19, a virtual visit is felt to be most appropriate for this patient at this time.   Patient location: home Provider location: [place] at [facility], office If any vitals were documented, they were collected by patient at home unless specified below.    Temp 98.3 F (36.8 C) (Oral)   Ht 6\' 1"  (1.854 m)   BMI 35.62 kg/m    CC: increased gassiness Subjective:    Patient ID: Larry Goodwin, male    DOB: Nov 01, 1976, 43 y.o.   MRN: [id number]  HPI: Larry Goodwin is a 43 y.o. male presenting on 05/04/2019 for Constipation (C/o constipation and not having full BMs. Sxs started about 2 wks ago after completing abx. ) and Gas (C/o excessive gas after eating. )   Several days of diarrhea (watery stool) after he finished prior abx course (augmentin 10d course mid April for sinusitis). Also notes increased gassiness/belching with meals. Decreased appetite. 3-4 nights ago had upper abdominal pain that lasted all night after drinking milk.   This morning had normal sized formed stool which was reassuring but continues having intermittent loose stools.   No fevers/chills, blood in stool, lower abd discomfort, urinary changes. Some blood with wiping.   Has increased water intake, cut out sodas almost completely.  Decreased appetite. Not eating much fruits/vegetables. He has tried yogurt.  Has been staying at home during Progress time.      Relevant past medical, surgical, family and social history reviewed and updated as indicated. Interim medical history since our last visit reviewed. Allergies and medications reviewed and updated. Outpatient Medications Prior to Visit  Medication Sig Dispense Refill  . Cholecalciferol (VITAMIN D3) 50 MCG (2000 UT) capsule Take 2 capsules (4,000 Units total) by mouth daily.    . fluticasone (FLONASE) 50 MCG/ACT nasal spray Place 2 sprays into both  nostrils daily. 16 g 1  . lisinopril (PRINIVIL,ZESTRIL) 40 MG tablet TAKE 1 TABLET BY MOUTH EVERY DAY 90 tablet 1  . Magnesium Oxide 500 MG CAPS Take 1 capsule (500 mg total) by mouth daily. 90 capsule 0  . [START ON 07/09/2019] oxyCODONE (OXY IR/ROXICODONE) 5 MG immediate release tablet Take 1 tablet (5 mg total) by mouth every 6 (six) hours as needed for up to 30 days for severe pain (May take one additional tab as needed). 120 tablet 0  . [START ON 06/09/2019] oxyCODONE (OXY IR/ROXICODONE) 5 MG immediate release tablet Take 1 tablet (5 mg total) by mouth every 6 (six) hours as needed for up to 30 days for severe pain (May take one additional tab as needed). 120 tablet 0  . [START ON 05/10/2019] oxyCODONE (OXY IR/ROXICODONE) 5 MG immediate release tablet Take 1 tablet (5 mg total) by mouth every 6 (six) hours as needed for up to 30 days for severe pain (May take one additional tab as needed). 120 tablet 0  . vitamin B-12 (CYANOCOBALAMIN) 1000 MCG tablet Take 1,000 mcg by mouth daily.     No facility-administered medications prior to visit.      Per HPI unless specifically indicated in ROS section below Review of Systems Objective:    Temp 98.3 F (36.8 C) (Oral)   Ht 6\' 1"  (1.854 m)   BMI 35.62 kg/m   Wt Readings from Last 3 Encounters:  03/27/19 270 lb (122.5 kg)  02/06/19 280 lb (127 kg)  11/02/18 280 lb (127 kg)  Physical exam: Gen: alert, NAD, not ill appearing Pulm: speaks in complete sentences without increased work of breathing Psych: normal mood, normal thought content      Assessment & Plan:   Problem List Items Addressed This Visit    Bowel habit changes - Primary    Anticipate antibiotic related GI upset with bowel changes. Discussed side effects of antibiotics regarding GI tract. Rec trial of activia or pill probiotic (align or philips colon health) for the next 1-2 wks. Update if not improving with treatment. Red flags to seek further care reviewed. Pt agrees with  plan.           No orders of the defined types were placed in this encounter.  No orders of the defined types were placed in this encounter.   Follow up plan: Return if symptoms worsen or fail to improve.  Larry Bush, MD

## 2019-05-04 NOTE — Assessment & Plan Note (Signed)
Anticipate antibiotic related GI upset with bowel changes. Discussed side effects of antibiotics regarding GI tract. Rec trial of activia or pill probiotic (align or philips colon health) for the next 1-2 wks. Update if not improving with treatment. Red flags to seek further care reviewed. Pt agrees with plan.

## 2019-05-05 ENCOUNTER — Encounter: Payer: Self-pay | Admitting: Family Medicine

## 2019-06-09 ENCOUNTER — Encounter: Payer: Self-pay | Admitting: Family Medicine

## 2019-06-09 ENCOUNTER — Ambulatory Visit (INDEPENDENT_AMBULATORY_CARE_PROVIDER_SITE_OTHER): Payer: Medicare Other | Admitting: Family Medicine

## 2019-06-09 VITALS — BP 123/90 | HR 60 | Temp 98.2°F | Ht 73.0 in

## 2019-06-09 DIAGNOSIS — Z6835 Body mass index (BMI) 35.0-35.9, adult: Secondary | ICD-10-CM | POA: Diagnosis not present

## 2019-06-09 DIAGNOSIS — R1013 Epigastric pain: Secondary | ICD-10-CM

## 2019-06-09 MED ORDER — OMEPRAZOLE 40 MG PO CPDR: 40.0000 mg | DELAYED_RELEASE_CAPSULE | Freq: Every day | ORAL | 3 refills | Status: DC

## 2019-06-09 NOTE — Assessment & Plan Note (Signed)
Anticipate GERD - will treat with omeprazole 40mg  daily for 3 wks then PRN. No red flags. Update if not improving with treatment. Advised limiting acidic foods, caffeine, alcohol. He doesn't take NSAIDs.

## 2019-06-09 NOTE — Assessment & Plan Note (Signed)
Congratulated on healthy lifestyle changes with regular walking routine. He is motivated to continue regular walking.

## 2019-06-09 NOTE — Progress Notes (Signed)
Virtual visit completed through [employer]. Due to national recommendations of social distancing due to COVID-19, a virtual visit is felt to be most appropriate for this patient at this time. Reviewed limitations of a virtual visit.   Patient location: home Provider location: [facility], office If any vitals were documented, they were collected by patient at home unless specified below.    BP 123/90   Pulse 60   Temp 98.2 F (36.8 C)   Ht 6\' 1"  (1.854 m)   SpO2 93%   BMI 35.62 kg/m    CC: stomach issues Subjective:    Patient ID: Larry Goodwin, male    DOB: [birth year], 43 y.o.   MRN: [id number]  HPI: AQEEL NORGAARD is a 43 y.o. male presenting on 06/09/2019 for GI Problem (C/o GI flare up of excessive gas, belching, burning in mid chest under sternum and bloating. Seen for and treated in past for same sxs. )   3-4d h/o throat burning, increased bloating/belching. Burning in mid chest. After eating grilled chicken sandwich and french fries at [place].   No fevers, bowel changes, blood in stool. No trouble swallowing. No unexpected weight loss.   See prior note for details. Seen here last month with similar symptoms, treated with [place] probiotic yogurt with some benefit. He has been avoiding dairy. Eating oatmeal for breakfast. Eating grilled chicken sandwiches. He has cut diet pepsi to 1 a day.   Has increased walking to 5-6 miles a day. Very happy about this. Doesn't hav ea weight but notes clothes are looser. Notes systolics are dropping.      Relevant past medical, surgical, family and social history reviewed and updated as indicated. Interim medical history since our last visit reviewed. Allergies and medications reviewed and updated. Outpatient Medications Prior to Visit  Medication Sig Dispense Refill  . fluticasone (FLONASE) 50 MCG/ACT nasal spray Place 2 sprays into both nostrils daily. 16 g 1  . lisinopril (PRINIVIL,ZESTRIL) 40 MG tablet TAKE 1  TABLET BY MOUTH EVERY DAY 90 tablet 1  . [START ON 07/09/2019] oxyCODONE (OXY IR/ROXICODONE) 5 MG immediate release tablet Take 1 tablet (5 mg total) by mouth every 6 (six) hours as needed for up to 30 days for severe pain (May take one additional tab as needed). 120 tablet 0  . oxyCODONE (OXY IR/ROXICODONE) 5 MG immediate release tablet Take 1 tablet (5 mg total) by mouth every 6 (six) hours as needed for up to 30 days for severe pain (May take one additional tab as needed). 120 tablet 0  . oxyCODONE (OXY IR/ROXICODONE) 5 MG immediate release tablet Take 1 tablet (5 mg total) by mouth every 6 (six) hours as needed for up to 30 days for severe pain (May take one additional tab as needed). 120 tablet 0  . vitamin B-12 (CYANOCOBALAMIN) 1000 MCG tablet Take 1,000 mcg by mouth daily.    . Cholecalciferol (VITAMIN D3) 50 MCG (2000 UT) capsule Take 2 capsules (4,000 Units total) by mouth daily.    . Magnesium Oxide 500 MG CAPS Take 1 capsule (500 mg total) by mouth daily. 90 capsule 0   No facility-administered medications prior to visit.      Per HPI unless specifically indicated in ROS section below Review of Systems Objective:    BP 123/90   Pulse 60   Temp 98.2 F (36.8 C)   Ht 6\' 1"  (1.854 m)   SpO2 93%   BMI 35.62 kg/m   Wt Readings from  Last 3 Encounters:  03/27/19 270 lb (122.5 kg)  02/06/19 280 lb (127 kg)  11/02/18 280 lb (127 kg)     Physical exam: Gen: alert, NAD, not ill appearing Pulm: speaks in complete sentences without increased work of breathing Psych: normal mood, normal thought content       Assessment & Plan:   Problem List Items Addressed This Visit    Severe obesity (BMI 35.0-35.9 with comorbidity) ([place])    Congratulated on healthy lifestyle changes with regular walking routine. He is motivated to continue regular walking.      Epigastric pain - Primary    Anticipate GERD - will treat with omeprazole 40mg  daily for 3 wks then PRN. No red flags. Update if  not improving with treatment. Advised limiting acidic foods, caffeine, alcohol. He doesn't take NSAIDs.           Meds ordered this encounter  Medications  . omeprazole (PRILOSEC) 40 MG capsule    Sig: Take 1 capsule (40 mg total) by mouth daily. For 3 weeks then as needed, take 30 min before a meal    Dispense:  30 capsule    Refill:  3   No orders of the defined types were placed in this encounter.   I discussed the assessment and treatment plan with the patient. The patient was provided an opportunity to ask questions and all were answered. The patient agreed with the plan and demonstrated an understanding of the instructions. The patient was advised to call back or seek an in-person evaluation if the symptoms worsen or if the condition fails to improve as anticipated.  Follow up plan: Return if symptoms worsen or fail to improve.  Ria Bush, MD

## 2019-07-05 ENCOUNTER — Other Ambulatory Visit: Payer: Self-pay | Admitting: Family Medicine

## 2019-07-17 ENCOUNTER — Encounter: Payer: Self-pay | Admitting: Family Medicine

## 2019-07-17 ENCOUNTER — Ambulatory Visit: Payer: Medicare Other

## 2019-07-17 ENCOUNTER — Telehealth (INDEPENDENT_AMBULATORY_CARE_PROVIDER_SITE_OTHER): Payer: Medicare Other | Admitting: Family Medicine

## 2019-07-17 ENCOUNTER — Ambulatory Visit (INDEPENDENT_AMBULATORY_CARE_PROVIDER_SITE_OTHER): Payer: Medicare Other

## 2019-07-17 VITALS — Ht 74.0 in | Wt 260.0 lb

## 2019-07-17 DIAGNOSIS — I10 Essential (primary) hypertension: Secondary | ICD-10-CM

## 2019-07-17 DIAGNOSIS — R0981 Nasal congestion: Secondary | ICD-10-CM

## 2019-07-17 DIAGNOSIS — E559 Vitamin D deficiency, unspecified: Secondary | ICD-10-CM

## 2019-07-17 DIAGNOSIS — Z0000 Encounter for general adult medical examination without abnormal findings: Secondary | ICD-10-CM | POA: Diagnosis not present

## 2019-07-17 DIAGNOSIS — Z8639 Personal history of other endocrine, nutritional and metabolic disease: Secondary | ICD-10-CM | POA: Diagnosis not present

## 2019-07-17 DIAGNOSIS — F411 Generalized anxiety disorder: Secondary | ICD-10-CM

## 2019-07-17 DIAGNOSIS — R1013 Epigastric pain: Secondary | ICD-10-CM

## 2019-07-17 DIAGNOSIS — G894 Chronic pain syndrome: Secondary | ICD-10-CM | POA: Diagnosis not present

## 2019-07-17 DIAGNOSIS — Z6835 Body mass index (BMI) 35.0-35.9, adult: Secondary | ICD-10-CM | POA: Diagnosis not present

## 2019-07-17 DIAGNOSIS — E538 Deficiency of other specified B group vitamins: Secondary | ICD-10-CM | POA: Diagnosis not present

## 2019-07-17 DIAGNOSIS — R790 Abnormal level of blood mineral: Secondary | ICD-10-CM | POA: Diagnosis not present

## 2019-07-17 NOTE — Progress Notes (Deleted)
This visit was conducted in person.  BP 131/90   Temp (!) 97.2 F (36.2 C)   Ht 6\' 1"  (1.854 m)   BMI 34.30 kg/m    CC: AMW f/u visit Subjective:    Patient ID: Larry Goodwin, male    DOB: 08/23/1976, 43 y.o.   MRN: [id number]  HPI: Larry Goodwin is a 43 y.o. male presenting on 07/17/2019 for Medicare Wellness   Saw health advisor today for medicare wellness visit. Note reviewed.    Preventative: Flu shot - declines Advanced planning - full code. Would want Chaya Jan (fiance) to be HCPOA.  Seat belt and sunscreen use discussed. No changing moles on skin.  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 Interim medical history since our last visit reviewed. Allergies and medications reviewed and updated. Outpatient Medications Prior to Visit  Medication Sig Dispense Refill  . fluticasone (FLONASE) 50 MCG/ACT nasal spray Place 2 sprays into both nostrils daily. 16 g 1  . lisinopril (ZESTRIL) 40 MG tablet TAKE 1 TABLET BY MOUTH EVERY DAY 90 tablet 0  . omeprazole (PRILOSEC) 40 MG capsule Take 1 capsule (40 mg total) by mouth daily. For 3 weeks then as needed, take 30 min before a meal 30 capsule 3  . oxyCODONE (OXY IR/ROXICODONE) 5 MG immediate release tablet Take 1 tablet (5 mg total) by mouth every 6 (six) hours as needed for up to 30 days for severe pain (May take one additional tab as needed). 120 tablet 0  . vitamin B-12 (CYANOCOBALAMIN) 1000 MCG tablet Take 1,000 mcg by mouth daily.    . Cholecalciferol (VITAMIN D3) 50 MCG (2000 UT) capsule Take 2 capsules (4,000 Units total) by mouth daily.    . Magnesium Oxide 500 MG  CAPS Take 1 capsule (500 mg total) by mouth daily. 90 capsule 0  . oxyCODONE (OXY IR/ROXICODONE) 5 MG immediate release tablet Take 1 tablet (5 mg total) by mouth every 6 (six) hours as needed for up to 30 days for severe pain (May take one additional tab as needed). 120 tablet 0  . oxyCODONE (OXY IR/ROXICODONE) 5 MG immediate release tablet Take 1 tablet (5 mg total) by mouth every 6 (six) hours as needed for up to 30 days for severe pain (May take one additional tab as needed). 120 tablet 0   No facility-administered medications prior to visit.      Per HPI unless specifically indicated in ROS section below Review of Systems Objective:    BP 131/90   Temp (!) 97.2 F (36.2 C)   Ht 6\' 1"  (1.854 m)   BMI 34.30 kg/m   Wt Readings from Last 3 Encounters:  07/17/19 260 lb (117.9 kg)  03/27/19 270 lb (122.5 kg)  02/06/19 280 lb (127 kg)    Physical Exam    Results for orders placed or performed in visit on 02/06/19  ToxASSURE Select 13 (MW), Urine  Result Value Ref Range   Summary FINAL   Comp. Metabolic Panel (12)  Result Value Ref Range   Glucose 185 (H) 65 - 99 mg/dL   BUN 8 6 - 24 mg/dL  Creatinine, Ser 0.76 0.76 - 1.27 mg/dL   GFR calc non Af Amer 112 >59 mL/min/1.73   GFR calc Af Amer 129 >59 mL/min/1.73   BUN/Creatinine Ratio 11 9 - 20   Sodium 136 134 - 144 mmol/L   Potassium 4.5 3.5 - 5.2 mmol/L   Chloride 98 96 - 106 mmol/L   Calcium 8.7 8.7 - 10.2 mg/dL   Total Protein 6.5 6.0 - 8.5 g/dL   Albumin 4.1 4.0 - 5.0 g/dL   Globulin, Total 2.4 1.5 - 4.5 g/dL   Albumin/Globulin Ratio 1.7 1.2 - 2.2   Bilirubin Total 0.8 0.0 - 1.2 mg/dL   Alkaline Phosphatase 49 39 - 117 IU/L   AST 16 0 - 40 IU/L  Magnesium  Result Value Ref Range   Magnesium 1.8 1.6 - 2.3 mg/dL  Vitamin B12  Result Value Ref Range   Vitamin B-12 664 232 - 1,245 pg/mL  Sedimentation rate  Result Value Ref Range   Sed Rate 6 0 - 15 mm/hr  25-Hydroxyvitamin D Lcms D2+D3  Result Value Ref Range    25-Hydroxy, Vitamin D 35 ng/mL   25-Hydroxy, Vitamin D-2 <1.0 ng/mL   25-Hydroxy, Vitamin D-3 34 ng/mL  C-reactive protein  Result Value Ref Range   CRP 9 0 - 10 mg/L   Assessment & Plan:   Problem List Items Addressed This Visit    None       No orders of the defined types were placed in this encounter.  No orders of the defined types were placed in this encounter.   Follow up plan: No follow-ups on file.  Ria Bush, MD

## 2019-07-17 NOTE — Progress Notes (Addendum)
Subjective:   Larry Goodwin is a 43 y.o. male who presents for Medicare Annual/Subsequent preventive examination.  This visit type was conducted due to national recommendations for restrictions regarding the COVID-19 Pandemic (e.g. social distancing). This format is felt to be most appropriate for this patient at this time. All issues noted in this document were discussed and addressed. No physical exam was performed (except for noted visual exam findings with Video Visits). This patient, Larry Goodwin, has given permission to perform this visit via telephone. Vital signs may be absent or patient reported.  Patient location:  At home  Nurse location:  At home     Review of Systems:  n/a Cardiac Risk Factors include: diabetes mellitus;hypertension;male gender;obesity (BMI >30kg/m2)     Objective:    Vitals: Ht 6\' 2"  (1.88 m)   Wt 260 lb (117.9 kg) Comment: per patient  BMI 33.38 kg/m   Body mass index is 33.38 kg/m.  Advanced Directives 07/17/2019 07/12/2018 07/09/2018 05/02/2018 11/09/2017 08/17/2017 05/17/2017  Does Patient Have a Medical Advance Directive? No No No No No No No  Would patient like information on creating a medical advance directive? - No - Patient declined No - Patient declined No - Patient declined - - No - Patient declined    Tobacco Social History   Tobacco Use  Smoking Status Former Smoker  . Quit date: 12/15/2007  . Years since quitting: 11.5  Smokeless Tobacco Never Used  Tobacco Comment   quit 6 years ago     Counseling given: Not Answered Comment: quit 6 years ago   Clinical Intake:  Pre-visit preparation completed: Yes  Pain : 0-10 Pain Score: 3  Pain Type: Chronic pain Pain Location: Back Pain Orientation: Lower Pain Descriptors / Indicators: Constant Pain Onset: More than a month ago Pain Frequency: Constant Pain Relieving Factors: medication and rest  Pain Relieving Factors: medication and rest  Nutritional Status: BMI > 30   Obese Nutritional Risks: None Diabetes: Yes CBG done?: No Did pt. bring in CBG monitor from home?: No  How often do you need to have someone help you when you read instructions, pamphlets, or other written materials from your doctor or pharmacy?: 1 - Never What is the last grade level you completed in school?: 12th grade  Interpreter Needed?: No  Information entered by :: NAllen LPN  Past Medical History:  Diagnosis Date  . Bell's palsy 2015   right then left sides  . Chronic pain syndrome    s/p 2 back surgeries [facility])  . DDD (degenerative disc disease), lumbar    severe s/p 2 surgeries  . HTN (hypertension)   . Morbid obesity ([place]) 08/16/2014   Past Surgical History:  Procedure Laterality Date  . BACK SURGERY  2012   x 2 [facility])  . CHOLECYSTECTOMY     Family History  Problem Relation Age of Onset  . COPD Mother        smoker (deceased at 84)  . Hypertension Other   . Stroke Neg Hx   . Cancer Neg Hx   . Diabetes Neg Hx   . CAD Neg Hx    Social History   Socioeconomic History  . Marital status: Married    Spouse name: Not on file  . Number of children: Not on file  . Years of education: Not on file  . Highest education level: Not on file  Occupational History  . Not on file  Social Needs  . Financial resource strain:  Not hard at all  . Food insecurity    Worry: Never true    Inability: Never true  . Transportation needs    Medical: No    Non-medical: No  Tobacco Use  . Smoking status: Former Smoker    Quit date: 12/15/2007    Years since quitting: 11.5  . Smokeless tobacco: Never Used  . Tobacco comment: quit 6 years ago  Substance and Sexual Activity  . Alcohol use: No    Alcohol/week: 0.0 standard drinks  . Drug use: Yes    Types: Oxycodone  . Sexual activity: Yes  Lifestyle  . Physical activity    Days per week: 7 days    Minutes per session: Not on file  . Stress: Not at all  Relationships  . Social Herbalist on phone: Not on  file    Gets together: Not on file    Attends religious service: Not on file    Active member of club or organization: Not on file    Attends meetings of clubs or organizations: Not on file    Relationship status: Not on file  Other Topics Concern  . 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 fluticasone (FLONASE) 50 MCG/ACT nasal spray Place 2 sprays into both nostrils daily.  Marland Kitchen lisinopril (ZESTRIL) 40 MG tablet TAKE 1 TABLET BY MOUTH EVERY DAY  . omeprazole (PRILOSEC) 40 MG capsule Take 1 capsule (40 mg total) by mouth daily. For 3 weeks then as needed, take 30 min before a meal  . oxyCODONE (OXY IR/ROXICODONE) 5 MG immediate release tablet Take 1 tablet (5 mg total) by mouth every 6 (six) hours as needed for up to 30 days for severe pain (May take one additional tab as needed).  . vitamin B-12 (CYANOCOBALAMIN) 1000 MCG tablet Take 1,000 mcg by mouth daily.  . Cholecalciferol (VITAMIN D3) 50 MCG (2000 UT) capsule Take 2 capsules (4,000 Units total) by mouth daily.  . Magnesium Oxide 500 MG CAPS Take 1 capsule (500 mg total) by mouth daily.  Marland Kitchen oxyCODONE (OXY IR/ROXICODONE) 5 MG immediate release tablet Take 1 tablet (5 mg total) by mouth every 6 (six) hours as needed for up to 30 days for severe pain (May take one additional tab as needed).  Marland Kitchen oxyCODONE (OXY IR/ROXICODONE) 5 MG immediate release tablet Take 1 tablet (5 mg total) by mouth every 6 (six) hours as needed for up to 30 days for severe pain (May take one additional tab as needed).   No facility-administered encounter medications on file as of 07/17/2019.     Activities of Daily Living In your present state of health, do you have any difficulty performing the following  activities: 07/17/2019  Hearing? N  Vision? N  Difficulty concentrating or making decisions? N  Walking or climbing stairs? N  Dressing or bathing? N  Doing errands, shopping? N  Preparing Food and eating ? N  Using the Toilet? N  In the past six months, have you accidently leaked urine? N  Do you have problems with loss of bowel control? N  Managing your Medications? N  Managing your Finances? N  Housekeeping or managing your Housekeeping? N  Some recent data might be hidden    Patient Care Team: Ria Bush, MD as PCP - General (Family Medicine)   Assessment:   This is a routine wellness examination for Larry Goodwin.  Exercise Activities and Dietary recommendations Current Exercise Habits: Home exercise routine, Type of exercise: walking, Frequency (Times/Week): 7  Goals    . Patient Stated     Starting 07/12/2018, I will continue to take medications as prescribed.     . Weight (lb) < 200 lb (90.7 kg)     07/17/2019, wants to live healthier and continue to lose weight       Fall Risk Fall Risk  07/17/2019 02/06/2019 11/02/2018 08/02/2018 07/12/2018  Falls in the past year? 0 0 1 No No  Risk for fall due to : Medication side effect - - - -  Follow up Falls evaluation completed;Falls prevention discussed - - - -   Is the patient's home free of loose throw rugs in walkways, pet beds, electrical cords, etc?   yes      Grab bars in the bathroom? no      Handrails on the stairs?   yes      Adequate lighting?   yes  Timed Get Up and Go Performed: n/a  Depression Screen PHQ 2/9 Scores 07/17/2019 08/02/2018 07/12/2018 05/02/2018  PHQ - 2 Score 0 0 0 0  PHQ- 9 Score 0 - 0 -    Cognitive Function MMSE - Mini Mental State Exam 07/17/2019 07/12/2018  Orientation to time 5 5  Orientation to Place 5 5  Registration 3 3  Attention/ Calculation 5 0  Recall 3 3  Language- name 2 objects 0 0  Language- repeat 1 1  Language- follow 3 step command 0 3  Language- read & follow direction 0  0  Write a sentence 0 0  Copy design 0 0  Total score 22 20   Mini Cog  Mini-Cog screen was completed. Maximum score is 22. A value of 0 denotes this part of the MMSE was not completed or the patient failed this part of the Mini-Cog screening.        There is no immunization history on file for this patient.  Qualifies for Shingles Vaccine? no  Screening Tests Health Maintenance  Topic Date Due  . DTaP/Tdap/Td (1 - Tdap) 09/07/1994  . INFLUENZA VACCINE  07/15/2019  . TETANUS/TDAP  07/16/2020 (Originally 09/07/1994)  . HIV Screening  07/12/2049 (Originally 09/07/1990)   Cancer Screenings: Lung: Low Dose CT Chest recommended if Age 96-80 years, 30 pack-year currently smoking OR have quit w/in 15years. Patient does not qualify. Colorectal: n/a  Additional Screenings:  Hepatitis C Screening:na      Plan:   Patient wants to live healthier and continue to lose weight.  I have personally reviewed and noted the following in the patient's chart:   . Medical and social history . Use of alcohol, tobacco or illicit drugs  . Current medications and supplements . Functional ability and status . Nutritional status . Physical activity . Advanced directives . List of other physicians . Hospitalizations, surgeries, and ER visits in previous 12 months . Vitals . Screenings to include cognitive, depression, and falls . Referrals and appointments  In addition, I have reviewed and discussed with patient certain preventive protocols, quality metrics, and best practice recommendations. A written personalized care plan for preventive services as well as general preventive health recommendations were provided to patient.     Kellie Simmering, LPN  07/17/2019   

## 2019-07-17 NOTE — Patient Instructions (Signed)
[name] , Thank you for taking time to come for your Medicare Wellness Visit. I appreciate your ongoing commitment to your health goals. Please review the following plan we discussed and let me know if I can assist you in the future.   Screening recommendations/referrals: Colonoscopy: n/a Recommended yearly ophthalmology/optometry visit for glaucoma screening and checkup Recommended yearly dental visit for hygiene and checkup  Vaccinations: Influenza vaccine: declines Pneumococcal vaccine: n/a Tdap vaccine: declines Shingles vaccine: n/a    Advanced directives: Advance directive discussed with you today.  Conditions/risks identified: obesity  Next appointment: 07/17/2019 at 3:00  Preventive Care 40-64 Years, Male Preventive care refers to lifestyle choices and visits with your health care provider that can promote health and wellness. What does preventive care include?  A yearly physical exam. This is also called an annual well check.  Dental exams once or twice a year.  Routine eye exams. Ask your health care provider how often you should have your eyes checked.  Personal lifestyle choices, including:  Daily care of your teeth and gums.  Regular physical activity.  Eating a healthy diet.  Avoiding tobacco and drug use.  Limiting alcohol use.  Practicing safe sex.  Taking low-dose aspirin every day starting at age 60. What happens during an annual well check? The services and screenings done by your health care provider during your annual well check will depend on your age, overall health, lifestyle risk factors, and family history of disease. Counseling  Your health care provider may ask you questions about your:  Alcohol use.  Tobacco use.  Drug use.  Emotional well-being.  Home and relationship well-being.  Sexual activity.  Eating habits.  Work and work Statistician. Screening  You may have the following tests or measurements:  Height, weight,  and BMI.  Blood pressure.  Lipid and cholesterol levels. These may be checked every 5 years, or more frequently if you are over 74 years old.  Skin check.  Lung cancer screening. You may have this screening every year starting at age 78 if you have a 30-pack-year history of smoking and currently smoke or have quit within the past 15 years.  Fecal occult blood test (FOBT) of the stool. You may have this test every year starting at age 17.  Flexible sigmoidoscopy or colonoscopy. You may have a sigmoidoscopy every 5 years or a colonoscopy every 10 years starting at age 25.  Prostate cancer screening. Recommendations will vary depending on your family history and other risks.  Hepatitis C blood test.  Hepatitis B blood test.  Sexually transmitted disease (STD) testing.  Diabetes screening. This is done by checking your blood sugar (glucose) after you have not eaten for a while (fasting). You may have this done every 1-3 years. Discuss your test results, treatment options, and if necessary, the need for more tests with your health care provider. Vaccines  Your health care provider may recommend certain vaccines, such as:  Influenza vaccine. This is recommended every year.  Tetanus, diphtheria, and acellular pertussis (Tdap, Td) vaccine. You may need a Td booster every 10 years.  Zoster vaccine. You may need this after age 80.  Pneumococcal 13-valent conjugate (PCV13) vaccine. You may need this if you have certain conditions and have not been vaccinated.  Pneumococcal polysaccharide (PPSV23) vaccine. You may need one or two doses if you smoke cigarettes or if you have certain conditions. Talk to your health care provider about which screenings and vaccines you need and how often you need  them. This information is not intended to replace advice given to you by your health care provider. Make sure you discuss any questions you have with your health care provider. Document Released:  12/27/2015 Document Revised: 08/19/2016 Document Reviewed: 10/01/2015 Elsevier Interactive Patient Education  2017 [place] Prevention in the Home Falls can cause injuries. They can happen to people of all ages. There are many things you can do to make your home safe and to help prevent falls. What can I do on the outside of my home?  Regularly fix the edges of walkways and driveways and fix any cracks.  Remove anything that might make you trip as you walk through a door, such as a raised step or threshold.  Trim any bushes or trees on the path to your home.  Use bright outdoor lighting.  Clear any walking paths of anything that might make someone trip, such as rocks or tools.  Regularly check to see if handrails are loose or broken. Make sure that both sides of any steps have handrails.  Any raised decks and porches should have guardrails on the edges.  Have any leaves, snow, or ice cleared regularly.  Use sand or salt on walking paths during winter.  Clean up any spills in your garage right away. This includes oil or grease spills. What can I do in the bathroom?  Use night lights.  Install grab bars by the toilet and in the tub and shower. Do not use towel bars as grab bars.  Use non-skid mats or decals in the tub or shower.  If you need to sit down in the shower, use a plastic, non-slip stool.  Keep the floor dry. Clean up any water that spills on the floor as soon as it happens.  Remove soap buildup in the tub or shower regularly.  Attach bath mats securely with double-sided non-slip rug tape.  Do not have throw rugs and other things on the floor that can make you trip. What can I do in the bedroom?  Use night lights.  Make sure that you have a light by your bed that is easy to reach.  Do not use any sheets or blankets that are too big for your bed. They should not hang down onto the floor.  Have a firm chair that has side arms. You can use this for  support while you get dressed.  Do not have throw rugs and other things on the floor that can make you trip. What can I do in the kitchen?  Clean up any spills right away.  Avoid walking on wet floors.  Keep items that you use a lot in easy-to-reach places.  If you need to reach something above you, use a strong step stool that has a grab bar.  Keep electrical cords out of the way.  Do not use floor polish or wax that makes floors slippery. If you must use wax, use non-skid floor wax.  Do not have throw rugs and other things on the floor that can make you trip. What can I do with my stairs?  Do not leave any items on the stairs.  Make sure that there are handrails on both sides of the stairs and use them. Fix handrails that are broken or loose. Make sure that handrails are as long as the stairways.  Check any carpeting to make sure that it is firmly attached to the stairs. Fix any carpet that is loose or worn.  Avoid  having throw rugs at the top or bottom of the stairs. If you do have throw rugs, attach them to the floor with carpet tape.  Make sure that you have a light switch at the top of the stairs and the bottom of the stairs. If you do not have them, ask someone to add them for you. What else can I do to help prevent falls?  Wear shoes that:  Do not have high heels.  Have rubber bottoms.  Are comfortable and fit you well.  Are closed at the toe. Do not wear sandals.  If you use a stepladder:  Make sure that it is fully opened. Do not climb a closed stepladder.  Make sure that both sides of the stepladder are locked into place.  Ask someone to hold it for you, if possible.  Clearly mark and make sure that you can see:  Any grab bars or handrails.  First and last steps.  Where the edge of each step is.  Use tools that help you move around (mobility aids) if they are needed. These include:  Canes.  Walkers.  Scooters.  Crutches.  Turn on the  lights when you go into a dark area. Replace any light bulbs as soon as they burn out.  Set up your furniture so you have a clear path. Avoid moving your furniture around.  If any of your floors are uneven, fix them.  If there are any pets around you, be aware of where they are.  Review your medicines with your doctor. Some medicines can make you feel dizzy. This can increase your chance of falling. Ask your doctor what other things that you can do to help prevent falls. This information is not intended to replace advice given to you by your health care provider. Make sure you discuss any questions you have with your health care provider. Document Released: 09/26/2009 Document Revised: 05/07/2016 Document Reviewed: 01/04/2015 Elsevier Interactive Patient Education  2017 [employer].

## 2019-07-17 NOTE — Progress Notes (Signed)
Virtual visit completed through [place]. Due to national recommendations of social distancing due to COVID-19, a virtual visit is felt to be most appropriate for this patient at this time. Reviewed limitations of a virtual visit.   Patient location: home Provider location: [place] at [facility], office If any vitals were documented, they were collected by patient at home unless specified below.    BP 131/90   Temp (!) 97.2 F (36.2 C)   Ht 6\' 1"  (1.854 m)   BMI 34.30 kg/m    CC: AMW f/u visit Subjective:    Patient ID: Larry Goodwin, male    DOB: 1976/02/16, 43 y.o.   MRN: [id number]  HPI: Larry Goodwin is a 43 y.o. male presenting on 07/17/2019 for Medicare Wellness (f/u visit )   Saw health advisor today for medicare wellness visit. Note reviewed.   Ongoing sinus congestion and bilateral maxillary pressure, clear but thick mucous.  Wonders if he's developing allergy to new dog - border collie. Has not seen ENT or allergist. Has not been taking flonase.   Ongoing weight loss (another 10 lbs) with healthy diet and lifestyle changes! Breakfast 2 eggs, lunch spinach with cheese, dinner 2 chicken strips. Tries to walk 5+ mi daily.   GI symptoms have improved with grease food avoidance.   Preventative: Flu shot - declines Advanced planning - full code. Would want Chaya Jan (fiance) to be HCPOA.  Seat belt use discussed.  Sunscreen use discussed. No changing moles on skin.  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 Interim medical history since our last visit reviewed. Allergies and medications reviewed and  updated. Outpatient Medications Prior to Visit  Medication Sig Dispense Refill  . fluticasone (FLONASE) 50 MCG/ACT nasal spray Place 2 sprays into both nostrils daily. 16 g 1  . lisinopril (ZESTRIL) 40 MG tablet TAKE 1 TABLET BY MOUTH EVERY DAY 90 tablet 0  . omeprazole (PRILOSEC) 40 MG capsule Take 1 capsule (40 mg total) by mouth daily. For 3 weeks then as needed, take 30 min before a meal 30 capsule 3  . oxyCODONE (OXY IR/ROXICODONE) 5 MG immediate release tablet Take 1 tablet (5 mg total) by mouth every 6 (six) hours as needed for up to 30 days for severe pain (May take one additional tab as needed). 120 tablet 0  . vitamin B-12 (CYANOCOBALAMIN) 1000 MCG tablet Take 1,000 mcg by mouth daily.    . Cholecalciferol (VITAMIN D3) 50 MCG (2000 UT) capsule Take 2 capsules (4,000 Units total) by mouth daily.    . Magnesium Oxide 500 MG CAPS Take 1 capsule (500 mg total) by mouth daily. 90 capsule 0  . oxyCODONE (OXY IR/ROXICODONE) 5 MG immediate release tablet Take 1 tablet (5 mg total) by mouth every 6 (six) hours as needed for up to 30 days for severe pain (May take one additional tab as needed). 120 tablet 0  . oxyCODONE (OXY IR/ROXICODONE) 5 MG immediate release tablet Take 1 tablet (5 mg total) by mouth every 6 (six) hours as needed for up to 30 days for severe pain (May take one additional tab as needed). 120 tablet  0   No facility-administered medications prior to visit.      Per HPI unless specifically indicated in ROS section below Review of Systems Objective:    BP 131/90   Temp (!) 97.2 F (36.2 C)   Ht 6\' 1"  (1.854 m)   BMI 34.30 kg/m   Wt Readings from Last 3 Encounters:  07/17/19 260 lb (117.9 kg)  03/27/19 270 lb (122.5 kg)  02/06/19 280 lb (127 kg)     Physical exam: Gen: alert, NAD, not ill appearing Pulm: speaks in complete sentences without increased work of breathing Psych: normal mood, normal thought content      Results for orders placed or performed in visit  on 02/06/19  ToxASSURE Select 13 (MW), Urine  Result Value Ref Range   Summary FINAL   Comp. Metabolic Panel (12)  Result Value Ref Range   Glucose 185 (H) 65 - 99 mg/dL   BUN 8 6 - 24 mg/dL   Creatinine, Ser 0.76 0.76 - 1.27 mg/dL   GFR calc non Af Amer 112 >59 mL/min/1.73   GFR calc Af Amer 129 >59 mL/min/1.73   BUN/Creatinine Ratio 11 9 - 20   Sodium 136 134 - 144 mmol/L   Potassium 4.5 3.5 - 5.2 mmol/L   Chloride 98 96 - 106 mmol/L   Calcium 8.7 8.7 - 10.2 mg/dL   Total Protein 6.5 6.0 - 8.5 g/dL   Albumin 4.1 4.0 - 5.0 g/dL   Globulin, Total 2.4 1.5 - 4.5 g/dL   Albumin/Globulin Ratio 1.7 1.2 - 2.2   Bilirubin Total 0.8 0.0 - 1.2 mg/dL   Alkaline Phosphatase 49 39 - 117 IU/L   AST 16 0 - 40 IU/L  Magnesium  Result Value Ref Range   Magnesium 1.8 1.6 - 2.3 mg/dL  Vitamin B12  Result Value Ref Range   Vitamin B-12 664 232 - 1,245 pg/mL  Sedimentation rate  Result Value Ref Range   Sed Rate 6 0 - 15 mm/hr  25-Hydroxyvitamin D Lcms D2+D3  Result Value Ref Range   25-Hydroxy, Vitamin D 35 ng/mL   25-Hydroxy, Vitamin D-2 <1.0 ng/mL   25-Hydroxy, Vitamin D-3 34 ng/mL  C-reactive protein  Result Value Ref Range   CRP 9 0 - 10 mg/L   Lab Results  Component Value Date   HGBA1C 5.3 07/12/2018    Assessment & Plan:  Return Wednesday 4pm for labs.  Problem List Items Addressed This Visit    Vitamin D deficiency    Update levels with regular vit D replacement (4000 IU daily)      Relevant Orders   VITAMIN D 25 Hydroxy (Vit-D Deficiency, Fractures)   Vitamin B12 deficiency    Update levels with regular B12 replacement.      Relevant Orders   Vitamin B12   Severe obesity (BMI 35.0-35.9 with comorbidity) ([place]) - Primary    Congratulated on ongoing weight loss through sustainable healthy diet and lifestyle changes. Pt motivated to continue.       Relevant Orders   Lipid panel   Comprehensive metabolic panel   TSH   Nasal congestion    Ongoing - possible allergy  related. He does have a new dog and has been walking outdoors regularly for exercise. Recommend restart flonase, update with effect. H/o recurrent sinus infections - has not recently seen ENT or allergist.       Low magnesium levels    Update magnesium levels      Relevant Orders  Magnesium   HTN (hypertension)    Chronic, stable. Continue lisinopril 40mg  daily.       Relevant Orders   Lipid panel   Comprehensive metabolic panel   TSH   History of type 2 diabetes mellitus   Relevant Orders   Hemoglobin A1c   Generalized anxiety disorder    Increased stress noted with pandemic. Reviewed stress relieving strategies.       Epigastric pain    Improved with limiting greasy foods.       Chronic pain syndrome (Chronic)    Followed by pain clinic.           No orders of the defined types were placed in this encounter.  Orders Placed This Encounter  Procedures  . Vitamin B12    Standing Status:   Future    Standing Expiration Date:   07/17/2020  . VITAMIN D 25 Hydroxy (Vit-D Deficiency, Fractures)    Standing Status:   Future    Standing Expiration Date:   07/17/2020  . Lipid panel    Standing Status:   Future    Standing Expiration Date:   07/17/2020  . Comprehensive metabolic panel    Standing Status:   Future    Standing Expiration Date:   07/17/2020  . TSH    Standing Status:   Future    Standing Expiration Date:   07/17/2020  . Hemoglobin A1c    Standing Status:   Future    Standing Expiration Date:   07/17/2020  . Magnesium    Standing Status:   Future    Standing Expiration Date:   07/17/2020    I discussed the assessment and treatment plan with the patient. The patient was provided an opportunity to ask questions and all were answered. The patient agreed with the plan and demonstrated an understanding of the instructions. The patient was advised to call back or seek an in-person evaluation if the symptoms worsen or if the condition fails to improve as anticipated.   Follow up plan: No follow-ups on file.  Ria Bush, MD

## 2019-07-17 NOTE — Progress Notes (Signed)
PCP notes:  Health Maintenance:  No gaps: declines tetanus  Abnormal Screenings:  None  Patient concerns:  None  Nurse concerns:  None  Next PCP appt.: 07/17/2019 at 3:00

## 2019-07-18 DIAGNOSIS — J329 Chronic sinusitis, unspecified: Secondary | ICD-10-CM | POA: Insufficient documentation

## 2019-07-18 DIAGNOSIS — R0982 Postnasal drip: Secondary | ICD-10-CM | POA: Insufficient documentation

## 2019-07-18 NOTE — Assessment & Plan Note (Signed)
Increased stress noted with pandemic. Reviewed stress relieving strategies.

## 2019-07-18 NOTE — Assessment & Plan Note (Signed)
Followed by pain clinic.  

## 2019-07-18 NOTE — Assessment & Plan Note (Signed)
Chronic, stable. Continue lisinopril 40mg  daily.

## 2019-07-18 NOTE — Assessment & Plan Note (Signed)
Update magnesium levels

## 2019-07-18 NOTE — Assessment & Plan Note (Signed)
Congratulated on ongoing weight loss through sustainable healthy diet and lifestyle changes. Pt motivated to continue.

## 2019-07-18 NOTE — Assessment & Plan Note (Signed)
Update levels with regular vit D replacement (4000 IU daily)

## 2019-07-18 NOTE — Assessment & Plan Note (Addendum)
Ongoing - possible allergy related. He does have a new dog and has been walking outdoors regularly for exercise. Recommend restart flonase, update with effect. H/o recurrent sinus infections - has not recently seen ENT or allergist.

## 2019-07-18 NOTE — Assessment & Plan Note (Signed)
Improved with limiting greasy foods.

## 2019-07-18 NOTE — Assessment & Plan Note (Signed)
Update levels with regular B12 replacement.

## 2019-07-19 ENCOUNTER — Other Ambulatory Visit (INDEPENDENT_AMBULATORY_CARE_PROVIDER_SITE_OTHER): Payer: Medicare Other

## 2019-07-19 DIAGNOSIS — E559 Vitamin D deficiency, unspecified: Secondary | ICD-10-CM | POA: Diagnosis not present

## 2019-07-19 DIAGNOSIS — Z8639 Personal history of other endocrine, nutritional and metabolic disease: Secondary | ICD-10-CM | POA: Diagnosis not present

## 2019-07-19 DIAGNOSIS — Z6835 Body mass index (BMI) 35.0-35.9, adult: Secondary | ICD-10-CM | POA: Diagnosis not present

## 2019-07-19 DIAGNOSIS — I10 Essential (primary) hypertension: Secondary | ICD-10-CM | POA: Diagnosis not present

## 2019-07-19 DIAGNOSIS — E538 Deficiency of other specified B group vitamins: Secondary | ICD-10-CM | POA: Diagnosis not present

## 2019-07-19 DIAGNOSIS — R790 Abnormal level of blood mineral: Secondary | ICD-10-CM

## 2019-07-20 LAB — COMPREHENSIVE METABOLIC PANEL
ALT: 11 U/L (ref 0–53)
AST: 15 U/L (ref 0–37)
Albumin: 4.3 g/dL (ref 3.5–5.2)
Alkaline Phosphatase: 45 U/L (ref 39–117)
BUN: 14 mg/dL (ref 6–23)
CO2: 30 mEq/L (ref 19–32)
Calcium: 9.2 mg/dL (ref 8.4–10.5)
Chloride: 104 mEq/L (ref 96–112)
Creatinine, Ser: 0.85 mg/dL (ref 0.40–1.50)
GFR: 97.98 mL/min (ref >60.00)
Glucose, Bld: 113 mg/dL — ABNORMAL HIGH (ref 70–99)
Potassium: 4.4 mEq/L (ref 3.5–5.1)
Sodium: 141 mEq/L (ref 135–145)
Total Bilirubin: 0.7 mg/dL (ref 0.2–1.2)
Total Protein: 6.5 g/dL (ref 6.0–8.3)

## 2019-07-20 LAB — LIPID PANEL
Cholesterol: 148 mg/dL (ref 0–200)
HDL: 36.6 mg/dL — ABNORMAL LOW (ref >39.00)
LDL Cholesterol: 97 mg/dL (ref 0–99)
NonHDL: 111.72
Total CHOL/HDL Ratio: 4
Triglycerides: 75 mg/dL (ref 0.0–149.0)
VLDL: 15 mg/dL (ref 0.0–40.0)

## 2019-07-20 LAB — VITAMIN D 25 HYDROXY (VIT D DEFICIENCY, FRACTURES): VITD: 48.99 ng/mL (ref 30.00–100.00)

## 2019-07-20 LAB — MAGNESIUM: Magnesium: 1.9 mg/dL (ref 1.5–2.5)

## 2019-07-20 LAB — HEMOGLOBIN A1C: Hgb A1c MFr Bld: 5.5 % (ref 4.6–6.5)

## 2019-07-20 LAB — VITAMIN B12: Vitamin B-12: 544 pg/mL (ref 211–911)

## 2019-07-20 LAB — TSH: TSH: 1.62 u[IU]/mL (ref 0.35–4.50)

## 2019-07-24 ENCOUNTER — Encounter: Payer: Self-pay | Admitting: Family Medicine

## 2019-07-24 ENCOUNTER — Ambulatory Visit (INDEPENDENT_AMBULATORY_CARE_PROVIDER_SITE_OTHER): Payer: Medicare Other | Admitting: Family Medicine

## 2019-07-24 VITALS — BP 128/88 | Temp 98.3°F | Ht 73.0 in

## 2019-07-24 DIAGNOSIS — J019 Acute sinusitis, unspecified: Secondary | ICD-10-CM | POA: Diagnosis not present

## 2019-07-24 MED ORDER — AMOXICILLIN-POT CLAVULANATE 875-125 MG PO TABS: 1.0000 | ORAL_TABLET | Freq: Two times a day (BID) | ORAL | 0 refills | Status: AC

## 2019-07-24 NOTE — Assessment & Plan Note (Signed)
Anticipate recurrent sinusitis - treat with augmentin 10d course. Continue flonase. Will refer to ENT due to h/o recurrent sinus infections.

## 2019-07-24 NOTE — Progress Notes (Signed)
Virtual visit completed through [employer]. Due to national recommendations of social distancing due to COVID-19, a virtual visit is felt to be most appropriate for this patient at this time. Reviewed limitations of a virtual visit.   Patient location: home Provider location: [place] at [facility], office If any vitals were documented, they were collected by patient at home unless specified below.    BP 128/88   Temp 98.3 F (36.8 C)   Ht 6\' 1"  (1.854 m)   BMI 34.30 kg/m    CC: sinusitis? Subjective:    Patient ID: DOMINIQ FONTAINE, male    DOB: 1976/01/24, 43 y.o.   MRN: [id number]  HPI: ALEEM ELZA is a 43 y.o. male presenting on 07/24/2019 for Sinus Problem (C/o sinus pressure, facial discomfort and dizziness. H/o sinus infections.  Sxs started about 2 wks ago. )   2 wk h/o sinus pressure (bilateral maxillary), facial discomfort. Ongoing symptoms despite treating this as allergies with flonase (new border collie at home). Acute worsening over last 2 days - with new dizziness. Feeling increasing pressure in frontal sinuses as well. Some PNdrainage.   No ST, fever, cough. No ear pain.   H/o recurrent sinusitis in the past (tends to get every 3-4 months), h/o recurrent bell's palsy as well.  Has not seen ENT or allergist previously.  03/2019 - sinusitis treated with augmentin course 11/2018 - sinusitis treated with augmentin course 08/2018 - sinusitis treated with augmentin course And more over the years.   Does fully improve after abx.      Relevant past medical, surgical, family and social history reviewed and updated as indicated. Interim medical history since our last visit reviewed. Allergies and medications reviewed and updated. Outpatient Medications Prior to Visit  Medication Sig Dispense Refill  . Cholecalciferol (VITAMIN D3) 50 MCG (2000 UT) capsule Take 2 capsules (4,000 Units total) by mouth daily.    . fluticasone (FLONASE) 50 MCG/ACT nasal spray Place 2  sprays into both nostrils daily. 16 g 1  . lisinopril (ZESTRIL) 40 MG tablet TAKE 1 TABLET BY MOUTH EVERY DAY 90 tablet 0  . Magnesium Oxide 500 MG CAPS Take 1 capsule (500 mg total) by mouth daily. 90 capsule 0  . omeprazole (PRILOSEC) 40 MG capsule Take 1 capsule (40 mg total) by mouth daily. For 3 weeks then as needed, take 30 min before a meal 30 capsule 3  . oxyCODONE (OXY IR/ROXICODONE) 5 MG immediate release tablet Take 1 tablet (5 mg total) by mouth every 6 (six) hours as needed for up to 30 days for severe pain (May take one additional tab as needed). 120 tablet 0  . vitamin B-12 (CYANOCOBALAMIN) 1000 MCG tablet Take 1,000 mcg by mouth daily.    Marland Kitchen oxyCODONE (OXY IR/ROXICODONE) 5 MG immediate release tablet Take 1 tablet (5 mg total) by mouth every 6 (six) hours as needed for up to 30 days for severe pain (May take one additional tab as needed). 120 tablet 0  . oxyCODONE (OXY IR/ROXICODONE) 5 MG immediate release tablet Take 1 tablet (5 mg total) by mouth every 6 (six) hours as needed for up to 30 days for severe pain (May take one additional tab as needed). 120 tablet 0   No facility-administered medications prior to visit.      Per HPI unless specifically indicated in ROS section below Review of Systems Objective:    BP 128/88   Temp 98.3 F (36.8 C)   Ht 6\' 1"  (1.854 m)  BMI 34.30 kg/m   Wt Readings from Last 3 Encounters:  07/17/19 260 lb (117.9 kg)  03/27/19 270 lb (122.5 kg)  02/06/19 280 lb (127 kg)     Physical exam: Gen: alert, NAD, not ill appearing Pulm: speaks in complete sentences without increased work of breathing Psych: normal mood, normal thought content      Assessment & Plan:   Problem List Items Addressed This Visit    Acute sinusitis - Primary    Anticipate recurrent sinusitis - treat with augmentin 10d course. Continue flonase. Will refer to ENT due to h/o recurrent sinus infections.       Relevant Medications   amoxicillin-clavulanate  (AUGMENTIN) 875-125 MG tablet   Other Relevant Orders   Ambulatory referral to ENT       Meds ordered this encounter  Medications  . amoxicillin-clavulanate (AUGMENTIN) 875-125 MG tablet    Sig: Take 1 tablet by mouth 2 (two) times daily for 10 days.    Dispense:  20 tablet    Refill:  0   Orders Placed This Encounter  Procedures  . Ambulatory referral to ENT    Referral Priority:   Routine    Referral Type:   Consultation    Referral Reason:   Specialty Services Required    Requested Specialty:   Otolaryngology    Number of Visits Requested:   1    I discussed the assessment and treatment plan with the patient. The patient was provided an opportunity to ask questions and all were answered. The patient agreed with the plan and demonstrated an understanding of the instructions. The patient was advised to call back or seek an in-person evaluation if the symptoms worsen or if the condition fails to improve as anticipated.  Follow up plan: No follow-ups on file.  Ria Bush, MD

## 2019-08-01 ENCOUNTER — Encounter: Payer: Self-pay | Admitting: Pain Medicine

## 2019-08-01 NOTE — Progress Notes (Signed)
Pain Management Virtual Encounter Note - Virtual Visit via Telephone Telehealth (real-time audio visits between healthcare provider and patient).   Patient's Phone No. & Preferred Pharmacy:  [phone] (home); [phone] (mobile); (Preferred) [phone] [email or web address]  [id number] Altha [place], [place] [address] [place] [address] Phone: [phone] Fax: [phone]    Pre-screening note:  Our staff contacted Mr. Carley and offered him an "in person", "face-to-face" appointment versus a telephone encounter. He indicated preferring the telephone encounter, at this time.   Reason for Virtual Visit: COVID-19*  Social distancing based on CDC and AMA recommendations.   I contacted Natale Lay on 08/02/2019 via telephone.      I clearly identified myself as Gaspar Cola, MD. I verified that I was speaking with the correct person using two identifiers (Name: MAVEN VARELAS, and date of birth: 1975-12-17).  Advanced Informed Consent I sought verbal advanced consent from Natale Lay for virtual visit interactions. I informed Mr. Amero of possible security and privacy concerns, risks, and limitations associated with providing "not-in-person" medical evaluation and management services. I also informed Mr. Balaban of the availability of "in-person" appointments. Finally, I informed him that there would be a charge for the virtual visit and that he could be  personally, fully or partially, financially responsible for it. Mr. Morawski expressed understanding and agreed to proceed.   Historic Elements   Mr. ISAUL LANDI is a 43 y.o. year old, male patient evaluated today after his last encounter by our practice on 05/02/2019. Mr. Palos  has a past medical history of Bell's palsy (2015), Chronic pain syndrome, DDD (degenerative disc disease), lumbar, HTN (hypertension), and Morbid obesity ([place]) (08/16/2014). He also  has a past  surgical history that includes Back surgery (2012) and Cholecystectomy. Mr. Venard has a current medication list which includes the following prescription(s): amoxicillin-clavulanate, vitamin d3, fluticasone, lisinopril, magnesium oxide, omeprazole, oxycodone, oxycodone, oxycodone, and vitamin b-12. He  reports that he quit smoking about 11 years ago. He has never used smokeless tobacco. He reports current drug use. Drug: Oxycodone. He reports that he does not drink alcohol. Mr. Kornegay is allergic to levaquin [levofloxacin in d5w].   HPI  Today, he is being contacted for medication management.  Pharmacotherapy Assessment  Analgesic: Oxycodone IR 5 mg, 1 tab PO q 6 hrs (20 mg/day of oxycodone) MME/day:30 mg/day.   Monitoring: Pharmacotherapy: No side-effects or adverse reactions reported. [place] PMP: PDMP reviewed during this encounter.       Compliance: No problems identified. Effectiveness: Clinically acceptable. Plan: Refer to "POC".  UDS:  Summary  Date Value Ref Range Status  02/06/2019 FINAL  Final    Comment:    ==================================================================== TOXASSURE SELECT 13 (MW) ==================================================================== Test                             Result       Flag       Units Drug Present and Declared for Prescription Verification   Oxycodone                      722          EXPECTED   ng/mg creat   Oxymorphone                    576          EXPECTED   ng/mg creat   Noroxycodone  921          EXPECTED   ng/mg creat   Noroxymorphone                 116          EXPECTED   ng/mg creat    Sources of oxycodone are scheduled prescription medications.    Oxymorphone, noroxycodone, and noroxymorphone are expected    metabolites of oxycodone. Oxymorphone is also available as a    scheduled prescription medication. ==================================================================== Test                       Result    Flag   Units      Ref Range   Creatinine              109              mg/dL      >=20 ==================================================================== Declared Medications:  The flagging and interpretation on this report are based on the  following declared medications.  Unexpected results may arise from  inaccuracies in the declared medications.  **Note: The testing scope of this panel includes these medications:  Oxycodone  **Note: The testing scope of this panel does not include following  reported medications:  Cholecalciferol  Cyanocobalamin  Lisinopril  Magnesium Oxide ==================================================================== For clinical consultation, please call [phone]. ====================================================================    Laboratory Chemistry Profile (12 mo)  Renal: 02/06/2019: BUN/Creatinine Ratio 11 07/19/2019: BUN 14; Creatinine, Ser 0.85  Lab Results  Component Value Date   GFRAA 129 02/06/2019   GFRNONAA 112 02/06/2019   Hepatic: 07/19/2019: Albumin 4.3 Lab Results  Component Value Date   AST 15 07/19/2019   ALT 11 07/19/2019   Other: 02/06/2019: 25-Hydroxy, Vitamin D 35; 25-Hydroxy, Vitamin D-2 <1.0; 25-Hydroxy, Vitamin D-3 34; CRP 9; Sed Rate 6 07/19/2019: Vitamin B-12 544; VITD 48.99 Note: Above Lab results reviewed.  Imaging  Last 90 days:  No results found.  Assessment  The primary encounter diagnosis was Chronic pain syndrome. Diagnoses of Chronic low back pain (Primary Area of Pain) (Bilateral) (R>L), Chronic hip pain (Secondary area of Pain) (Right), Chronic upper back pain (Third area of Pain), Chronic shoulder pain (Fourth area of Pain) (Bilateral) (Right), and Low magnesium levels were also pertinent to this visit.  Plan of Care  I have discontinued Breylon Sherrow. Feinberg's oxyCODONE and oxyCODONE. I have also changed his oxyCODONE. Additionally, I am having him start on oxyCODONE and oxyCODONE. Lastly, I  am having him maintain his vitamin B-12, Magnesium Oxide, Vitamin D3, fluticasone, omeprazole, lisinopril, and amoxicillin-clavulanate.  Pharmacotherapy (Medications Ordered): Meds ordered this encounter  Medications  . oxyCODONE (OXY IR/ROXICODONE) 5 MG immediate release tablet    Sig: Take 1 tablet (5 mg total) by mouth every 6 (six) hours as needed for severe pain. Must last 30 days    Dispense:  120 tablet    Refill:  0    Chronic Pain: STOP Act (Not applicable) Fill 1 day early if closed on refill date. Do not fill until: 08/09/2019. To last until: 09/08/2019. Avoid benzodiazepines within 8 hours of opioids  . oxyCODONE (OXY IR/ROXICODONE) 5 MG immediate release tablet    Sig: Take 1 tablet (5 mg total) by mouth every 6 (six) hours as needed for severe pain. Must last 30 days    Dispense:  120 tablet    Refill:  0    Chronic Pain: STOP Act (Not applicable) Fill 1 day early  if closed on refill date. Do not fill until: 09/08/2019. To last until: 10/08/2019. Avoid benzodiazepines within 8 hours of opioids  . oxyCODONE (OXY IR/ROXICODONE) 5 MG immediate release tablet    Sig: Take 1 tablet (5 mg total) by mouth every 6 (six) hours as needed for severe pain. Must last 30 days    Dispense:  120 tablet    Refill:  0    Chronic Pain: STOP Act (Not applicable) Fill 1 day early if closed on refill date. Do not fill until: 10/08/2019. To last until: 11/07/2019. Avoid benzodiazepines within 8 hours of opioids   Orders:  No orders of the defined types were placed in this encounter.  Follow-up plan:   Return in about 3 months (around 11/06/2019) for (VV), E/M (MM).      Interventional therapies: Planned, scheduled, and/or pending:   Not at this time.   Considering:   Diagnostic right CESI  Diagnostic thoracic ESI  Diagnostic bilateral thoracic facet block  Possible bilateral thoracic facet RFA  Diagnostic bilateral lumbar facet block  Possible bilateral lumbar facet RFA  Diagnostic right  sacroiliac joint block  Possible right sacroiliac joint RFA  Diagnostic caudal ESI and epidurogram  Diagnostic right-sided L4-5 TFESI  Diagnostic right IA knee injection  Diagnostic right genicular NB  Possible right genicular RFA    Palliative PRN treatment(s):   None at this time    Recent Visits No visits were found meeting these conditions.  Showing recent visits within past 90 days and meeting all other requirements   Today's Visits Date Type Provider Dept  08/02/19 Office Visit Milinda Pointer, MD [facility]  Showing today's visits and meeting all other requirements   Future Appointments No visits were found meeting these conditions.  Showing future appointments within next 90 days and meeting all other requirements   I discussed the assessment and treatment plan with the patient. The patient was provided an opportunity to ask questions and all were answered. The patient agreed with the plan and demonstrated an understanding of the instructions.  Patient advised to call back or seek an in-person evaluation if the symptoms or condition worsens.  Total duration of non-face-to-face encounter: 12 minutes.  Note by: Gaspar Cola, MD Date: 08/02/2019; Time: 11:30 AM  Note: This dictation was prepared with Dragon dictation. Any transcriptional errors that may result from this process are unintentional.  Disclaimer:  * Given the special circumstances of the COVID-19 pandemic, the federal government has announced that the Office for Civil Rights (OCR) will exercise its enforcement discretion and will not impose penalties on physicians using telehealth in the event of noncompliance with regulatory requirements under the [place] (HIPAA) in connection with the good faith provision of telehealth during the JQZES-92 national public health emergency. ([place])

## 2019-08-02 ENCOUNTER — Ambulatory Visit: Payer: Medicare Other | Attending: Pain Medicine | Admitting: Pain Medicine

## 2019-08-02 ENCOUNTER — Other Ambulatory Visit: Payer: Self-pay

## 2019-08-02 DIAGNOSIS — M549 Dorsalgia, unspecified: Secondary | ICD-10-CM

## 2019-08-02 DIAGNOSIS — G894 Chronic pain syndrome: Secondary | ICD-10-CM

## 2019-08-02 DIAGNOSIS — M25511 Pain in right shoulder: Secondary | ICD-10-CM | POA: Diagnosis not present

## 2019-08-02 DIAGNOSIS — R790 Abnormal level of blood mineral: Secondary | ICD-10-CM

## 2019-08-02 DIAGNOSIS — M25551 Pain in right hip: Secondary | ICD-10-CM

## 2019-08-02 DIAGNOSIS — M545 Low back pain: Secondary | ICD-10-CM

## 2019-08-02 DIAGNOSIS — M25512 Pain in left shoulder: Secondary | ICD-10-CM

## 2019-08-02 DIAGNOSIS — G8929 Other chronic pain: Secondary | ICD-10-CM

## 2019-08-02 MED ORDER — OXYCODONE HCL 5 MG PO TABS: 5.0000 mg | ORAL_TABLET | Freq: Four times a day (QID) | ORAL | 0 refills | Status: DC | PRN

## 2019-08-02 NOTE — Patient Instructions (Signed)
____________________________________________________________________________________________  Medication Rules  Purpose: To inform patients, and their family members, of our rules and regulations.  Applies to: All patients receiving prescriptions (written or electronic).  Pharmacy of record: Pharmacy where electronic prescriptions will be sent. If written prescriptions are taken to a different pharmacy, please inform the nursing staff. The pharmacy listed in the electronic medical record should be the one where you would like electronic prescriptions to be sent.  Electronic prescriptions: In compliance with the [place] Strengthen Opioid Misuse Prevention (STOP) Act of 2017 (Session Law 2017-74/H243), effective December 14, 2018, all controlled substances must be electronically prescribed. Calling prescriptions to the pharmacy will cease to exist.  Prescription refills: Only during scheduled appointments. Applies to all prescriptions.  NOTE: The following applies primarily to controlled substances (Opioid* Pain Medications).   Patient's responsibilities: 1. Pain Pills: Bring all pain pills to every appointment (except for procedure appointments). 2. Pill Bottles: Bring pills in original pharmacy bottle. Always bring the newest bottle. Bring bottle, even if empty. 3. Medication refills: You are responsible for knowing and keeping track of what medications you take and those you need refilled. The day before your appointment: write a list of all prescriptions that need to be refilled. The day of the appointment: give the list to the admitting nurse. Prescriptions will be written only during appointments. No prescriptions will be written on procedure days. If you forget a medication: it will not be "Called in", "Faxed", or "electronically sent". You will need to get another appointment to get these prescribed. No early refills. Do not call asking to have your prescription filled  early. 4. Prescription Accuracy: You are responsible for carefully inspecting your prescriptions before leaving our office. Have the discharge nurse carefully go over each prescription with you, before taking them home. Make sure that your name is accurately spelled, that your address is correct. Check the name and dose of your medication to make sure it is accurate. Check the number of pills, and the written instructions to make sure they are clear and accurate. Make sure that you are given enough medication to last until your next medication refill appointment. 5. Taking Medication: Take medication as prescribed. When it comes to controlled substances, taking less pills or less frequently than prescribed is permitted and encouraged. Never take more pills than instructed. Never take medication more frequently than prescribed.  6. Inform other Doctors: Always inform, all of your healthcare providers, of all the medications you take. 7. Pain Medication from other Providers: You are not allowed to accept any additional pain medication from any other Doctor or Healthcare provider. There are two exceptions to this rule. (see below) In the event that you require additional pain medication, you are responsible for notifying us, as stated below. 8. Medication Agreement: You are responsible for carefully reading and following our Medication Agreement. This must be signed before receiving any prescriptions from our practice. Safely store a copy of your signed Agreement. Violations to the Agreement will result in no further prescriptions. (Additional copies of our Medication Agreement are available upon request.) 9. Laws, Rules, & Regulations: All patients are expected to follow all Federal and State Laws, Statutes, Rules, & Regulations. Ignorance of the Laws does not constitute a valid excuse. The use of any illegal substances is prohibited. 10. Adopted CDC guidelines & recommendations: Target dosing levels will be  at or below 60 MME/day. Use of benzodiazepines** is not recommended.  Exceptions: There are only two exceptions to the rule of not   receiving pain medications from other Healthcare Providers. 1. Exception #1 (Emergencies): In the event of an emergency (i.e.: accident requiring emergency care), you are allowed to receive additional pain medication. However, you are responsible for: As soon as you are able, call our office [phone], at any time of the day or night, and leave a message stating your name, the date and nature of the emergency, and the name and dose of the medication prescribed. In the event that your call is answered by a member of our staff, make sure to document and save the date, time, and the name of the person that took your information.  2. Exception #2 (Planned Surgery): In the event that you are scheduled by another doctor or dentist to have any type of surgery or procedure, you are allowed (for a period no longer than 30 days), to receive additional pain medication, for the acute post-op pain. However, in this case, you are responsible for picking up a copy of our "Post-op Pain Management for Surgeons" handout, and giving it to your surgeon or dentist. This document is available at our office, and does not require an appointment to obtain it. Simply go to our office during business hours (Monday-Thursday from 8:00 AM to 4:00 PM) (Friday 8:00 AM to 12:00 Noon) or if you have a scheduled appointment with us, prior to your surgery, and ask for it by name. In addition, you will need to provide us with your name, name of your surgeon, type of surgery, and date of procedure or surgery.  *Opioid medications include: morphine, codeine, oxycodone, oxymorphone, hydrocodone, hydromorphone, meperidine, tramadol, tapentadol, buprenorphine, fentanyl, methadone. **Benzodiazepine medications include: diazepam (Valium), alprazolam (Xanax), clonazepam (Klonopine), lorazepam (Ativan), clorazepate  (Tranxene), chlordiazepoxide (Librium), estazolam (Prosom), oxazepam (Serax), temazepam (Restoril), triazolam (Halcion) (Last updated: 02/10/2018) ____________________________________________________________________________________________   ____________________________________________________________________________________________  Medication Recommendations and Reminders  Applies to: All patients receiving prescriptions (written and/or electronic).  Medication Rules & Regulations: These rules and regulations exist for your safety and that of others. They are not flexible and neither are we. Dismissing or ignoring them will be considered "non-compliance" with medication therapy, resulting in complete and irreversible termination of such therapy. (See document titled "Medication Rules" for more details.) In all conscience, because of safety reasons, we cannot continue providing a therapy where the patient does not follow instructions.  Pharmacy of record:   Definition: This is the pharmacy where your electronic prescriptions will be sent.   We do not endorse any particular pharmacy.  You are not restricted in your choice of pharmacy.  The pharmacy listed in the electronic medical record should be the one where you want electronic prescriptions to be sent.  If you choose to change pharmacy, simply notify our nursing staff of your choice of new pharmacy.  Recommendations:  Keep all of your pain medications in a safe place, under lock and key, even if you live alone.   After you fill your prescription, take 1 week's worth of pills and put them away in a safe place. You should keep a separate, properly labeled bottle for this purpose. The remainder should be kept in the original bottle. Use this as your primary supply, until it runs out. Once it's gone, then you know that you have 1 week's worth of medicine, and it is time to come in for a prescription refill. If you do this correctly, it  is unlikely that you will ever run out of medicine.  To make sure that the above recommendation works,   it is very important that you make sure your medication refill appointments are scheduled at least 1 week before you run out of medicine. To do this in an effective manner, make sure that you do not leave the office without scheduling your next medication management appointment. Always ask the nursing staff to show you in your prescription , when your medication will be running out. Then arrange for the receptionist to get you a return appointment, at least 7 days before you run out of medicine. Do not wait until you have 1 or 2 pills left, to come in. This is very poor planning and does not take into consideration that we may need to cancel appointments due to bad weather, sickness, or emergencies affecting our staff.  "Partial Fill": If for any reason your pharmacy does not have enough pills/tablets to completely fill or refill your prescription, do not allow for a "partial fill". You will need a separate prescription to fill the remaining amount, which we will not provide. If the reason for the partial fill is your insurance, you will need to talk to the pharmacist about payment alternatives for the remaining tablets, but again, do not accept a partial fill.  Prescription refills and/or changes in medication(s):   Prescription refills, and/or changes in dose or medication, will be conducted only during scheduled medication management appointments. (Applies to both, written and electronic prescriptions.)  No refills on procedure days. No medication will be changed or started on procedure days. No changes, adjustments, and/or refills will be conducted on a procedure day. Doing so will interfere with the diagnostic portion of the procedure.  No phone refills. No medications will be "called into the pharmacy".  No Fax refills.  No weekend refills.  No Holliday refills.  No after hours  refills.  Remember:  Business hours are:  Monday to Thursday 8:00 AM to 4:00 PM Provider's Schedule: Effie Wahlert, MD - Appointments are:  Medication management: Monday and Wednesday 8:00 AM to 4:00 PM Procedure day: Tuesday and Thursday 7:30 AM to 4:00 PM Bilal Lateef, MD - Appointments are:  Medication management: Tuesday and Thursday 8:00 AM to 4:00 PM Procedure day: Monday and Wednesday 7:30 AM to 4:00 PM (Last update: 02/10/2018) ____________________________________________________________________________________________   ____________________________________________________________________________________________  CANNABIDIOL (AKA: CBD Oil or Pills)  Applies to: All patients receiving prescriptions of controlled substances (written and/or electronic).  General Information: Cannabidiol (CBD) was discovered in 1940. It is one of some 113 identified cannabinoids in cannabis (Marijuana) plants, accounting for up to 40% of the plant's extract. As of 2018, preliminary clinical research on cannabidiol included studies of anxiety, cognition, movement disorders, and pain.  Cannabidiol is consummed in multiple ways, including inhalation of cannabis smoke or vapor, as an aerosol spray into the cheek, and by mouth. It may be supplied as CBD oil containing CBD as the active ingredient (no added tetrahydrocannabinol (THC) or terpenes), a full-plant CBD-dominant hemp extract oil, capsules, dried cannabis, or as a liquid solution. CBD is thought not have the same psychoactivity as THC, and may affect the actions of THC. Studies suggest that CBD may interact with different biological targets, including cannabinoid receptors and other neurotransmitter receptors. As of 2018 the mechanism of action for its biological effects has not been determined.  In the [place], cannabidiol has a limited approval by the Food and Drug Administration (FDA) for treatment of only two types of epilepsy  disorders. The side effects of long-term use of the drug include somnolence, decreased appetite, diarrhea,   fatigue, malaise, weakness, sleeping problems, and others.  CBD remains a Schedule I drug prohibited for any use.  Legality: Some manufacturers ship CBD products nationally, an illegal action which the FDA has not enforced in 2018, with CBD remaining the subject of an FDA investigational new drug evaluation, and is not considered legal as a dietary supplement or food ingredient as of December 2018. Federal illegality has made it difficult historically to conduct research on CBD. CBD is openly sold in head shops and health food stores in some states where such sales have not been explicitly legalized.  Warning: Because it is not FDA approved for general use or treatment of pain, it is not required to undergo the same manufacturing controls as prescription drugs.  This means that the available cannabidiol (CBD) may be contaminated with THC.  If this is the case, it will trigger a positive urine drug screen (UDS) test for cannabinoids (Marijuana).  Because a positive UDS for illicit substances is a violation of our medication agreement, your opioid analgesics (pain medicine) may be permanently discontinued. (Last update: 03/03/2018) ____________________________________________________________________________________________    

## 2019-08-14 ENCOUNTER — Encounter: Payer: Self-pay | Admitting: Family Medicine

## 2019-08-14 ENCOUNTER — Other Ambulatory Visit: Payer: Self-pay

## 2019-08-14 ENCOUNTER — Ambulatory Visit (INDEPENDENT_AMBULATORY_CARE_PROVIDER_SITE_OTHER): Payer: Medicare Other | Admitting: Family Medicine

## 2019-08-14 DIAGNOSIS — J019 Acute sinusitis, unspecified: Secondary | ICD-10-CM | POA: Diagnosis not present

## 2019-08-14 MED ORDER — DOXYCYCLINE HYCLATE 100 MG PO TABS: 100.0000 mg | ORAL_TABLET | Freq: Two times a day (BID) | ORAL | 0 refills | Status: DC

## 2019-08-14 NOTE — Progress Notes (Signed)
Interactive audio and video telecommunications were attempted between this provider and patient, however failed, due to patient having technical difficulties OR patient did not have access to video capability.  We continued and completed visit with audio only.   Virtual Visit via Telephone Note  I connected with patient on 08/14/19  at 8:24 AM  by telephone and verified that I am speaking with the correct person using two identifiers.  Location of patient: home  Location of MD: [place] Name of referring provider (if blank then none associated): Names per persons and role in encounter:  MD: Earlyne Iba, Patient: name listed above.    I discussed the limitations, risks, security and privacy concerns of performing an evaluation and management service by telephone and the availability of in person appointments. I also discussed with the patient that there may be a patient responsible charge related to this service. The patient expressed understanding and agreed to proceed.  CC cold sx.   History of Present Illness:  C/o sinus congestion, sinus pressure, HA and drainage.  Now has chest congestion.  Dx a few weeks ago with sinusitis, tx abx.  Did not clear up completely, had some improvement but never back to baseline.   H/o recurrent sinusitis in the past (tends to get every 3-4 months), h/o recurrent bell's palsy as well.  Prev treated with augmentin.   Throat clearing.  Rhinorrhea is thick but clear. Sputum is light green.  He had GI upset on augmentin, tried taking [place] and that helped a little with GI sx.  No fevers.  No SOB.    D/w pt about ENT eval.  He has been off flonase recently.  D/w pt about restart.    His fiancee tested positive 3 weeks ago for covid but they have been attempting to live separately in the meantime. She was cleared to go back to work.  He doesn't have lost of taste.    D/w pt about covid options. He can either get tested or quarantine from 2 weeks from  her last symptomatic date.  He states he is >2 weeks from her last symptomatic date already.  He declines testing.    Observations/Objective: nad Speech wnl  Assessment and Plan:  D/w pt about covid options. He can either get tested or quarantine from 2 weeks from her last symptomatic date.  He states he is >2 weeks from her last symptomatic date already.  He declines testing.  Routine cautions given to patient.  Detailed conversation with patient about all plans and recent hx.   Presumed incompletely tx'd sinusitis.  Start doxy and I sent a staff message to check on ENT eval.    Restart flonase.  Update [place] as needed.   Follow Up Instructions: see above.    I discussed the assessment and treatment plan with the patient. The patient was provided an opportunity to ask questions and all were answered. The patient agreed with the plan and demonstrated an understanding of the instructions.   The patient was advised to call back or seek an in-person evaluation if the symptoms worsen or if the condition fails to improve as anticipated.  I provided 30 minutes of non-face-to-face time during this encounter.  Elsie Stain, MD

## 2019-08-14 NOTE — Assessment & Plan Note (Signed)
D/w pt about covid options. He can either get tested or quarantine from 2 weeks from her last symptomatic date.  He states he is >2 weeks from her last symptomatic date already.  He declines testing.  Routine cautions given to patient.  Detailed conversation with patient about all plans and recent hx.   Presumed incompletely tx'd sinusitis.  Start doxy and I sent a staff message to check on ENT eval.    Restart flonase.  Update [place] as needed.

## 2019-08-14 NOTE — Telephone Encounter (Signed)
Spoke with pt to see if I could help him with anything.  Says he just wants to discuss with Dr. Darnell Level about his virtual visit this morning.  Pt is aware Dr. Darnell Level is out of the office.  Says he can wait until tomorrow for call back.

## 2019-09-25 ENCOUNTER — Encounter: Payer: Self-pay | Admitting: Family Medicine

## 2019-09-25 ENCOUNTER — Ambulatory Visit (INDEPENDENT_AMBULATORY_CARE_PROVIDER_SITE_OTHER): Payer: Medicare Other | Admitting: Family Medicine

## 2019-09-25 VITALS — HR 77 | Temp 97.7°F

## 2019-09-25 DIAGNOSIS — R0982 Postnasal drip: Secondary | ICD-10-CM | POA: Diagnosis not present

## 2019-09-25 MED ORDER — FLUTICASONE PROPIONATE 50 MCG/ACT NA SUSP: 2.0000 | Freq: Every day | NASAL | 3 refills | Status: DC

## 2019-09-25 MED ORDER — LORATADINE 10 MG PO TABS: 10.0000 mg | ORAL_TABLET | Freq: Every day | ORAL | 3 refills | Status: DC

## 2019-09-25 MED ORDER — GUAIFENESIN 200 MG PO TABS: 200.0000 mg | ORAL_TABLET | ORAL | 1 refills | Status: DC | PRN

## 2019-09-25 NOTE — Progress Notes (Signed)
Virtual visit completed through [employer]. Due to national recommendations of social distancing due to COVID-19, a virtual visit is felt to be most appropriate for this patient at this time. Reviewed limitations of a virtual visit.   Patient location: home Provider location: [place] at [facility], office If any vitals were documented, they were collected by patient at home unless specified below.    Pulse 77 Comment: per patient  Temp 97.7 F (36.5 C) Comment: per patient  SpO2 96% Comment: per patient   CC: PNDraingae Subjective:    Patient ID: Larry Goodwin, male    DOB: 29-May-1976, 43 y.o.   MRN: [id number]  HPI: MAARTEN BAISCH is a 43 y.o. male presenting on 09/25/2019 for Nasal Congestion (post nasal drip. No fever. Symptoms present for several months )   Ongoing post nasal drainage mostly clear over the last several months. Feels he never fully resolved from sinusitis 07/2019 despite 10d augmentin course. Blowing nose with productive mucous. No sinus pressure pain or headache. Some dizziness from nasal and ear congestion.  No fevers, cough, dyspnea or wheezing.   He has not been taking flonase or antihistamine.   Referred to ENT 07/2019 - has not been able to see due to outstanding bill.   07/2019 - sinusitis treated with augmentin course - never fully improved, did not take doxy course prescribed. 03/2019 - sinusitis treated with augmentin course 11/2018 - sinusitis treated with augmentin course 08/2018 - sinusitis treated with augmentin course And more over the years.   Does fully improve after abx.  Celesta Gentile did have covid several months ago, she is now recovered. He never had covid symptoms.      Relevant past medical, surgical, family and social history reviewed and updated as indicated. Interim medical history since our last visit reviewed. Allergies and medications reviewed and updated. Outpatient Medications Prior to Visit  Medication Sig Dispense  Refill  . Cholecalciferol (VITAMIN D3) 50 MCG (2000 UT) capsule Take 2 capsules (4,000 Units total) by mouth daily.    Marland Kitchen lisinopril (ZESTRIL) 40 MG tablet TAKE 1 TABLET BY MOUTH EVERY DAY 90 tablet 0  . Magnesium Oxide 500 MG CAPS Take 1 capsule (500 mg total) by mouth daily. 90 capsule 0  . omeprazole (PRILOSEC) 40 MG capsule Take 1 capsule (40 mg total) by mouth daily. For 3 weeks then as needed, take 30 min before a meal 30 capsule 3  . oxyCODONE (OXY IR/ROXICODONE) 5 MG immediate release tablet Take 1 tablet (5 mg total) by mouth every 6 (six) hours as needed for severe pain. Must last 30 days 120 tablet 0  . [START ON 10/08/2019] oxyCODONE (OXY IR/ROXICODONE) 5 MG immediate release tablet Take 1 tablet (5 mg total) by mouth every 6 (six) hours as needed for severe pain. Must last 30 days 120 tablet 0  . vitamin B-12 (CYANOCOBALAMIN) 1000 MCG tablet Take 1,000 mcg by mouth daily.    Marland Kitchen oxyCODONE (OXY IR/ROXICODONE) 5 MG immediate release tablet Take 1 tablet (5 mg total) by mouth every 6 (six) hours as needed for severe pain. Must last 30 days 120 tablet 0  . doxycycline (VIBRA-TABS) 100 MG tablet Take 1 tablet (100 mg total) by mouth 2 (two) times daily. 20 tablet 0  . fluticasone (FLONASE) 50 MCG/ACT nasal spray Place 2 sprays into both nostrils daily. (Patient not taking: Reported on 09/25/2019) 16 g 1   No facility-administered medications prior to visit.      Per HPI unless specifically  indicated in ROS section below Review of Systems Objective:    Pulse 77 Comment: per patient  Temp 97.7 F (36.5 C) Comment: per patient  SpO2 96% Comment: per patient  Wt Readings from Last 3 Encounters:  07/17/19 260 lb (117.9 kg)  03/27/19 270 lb (122.5 kg)  02/06/19 280 lb (127 kg)     Physical exam: Gen: alert, NAD, not ill appearing Pulm: speaks in complete sentences without increased work of breathing Psych: normal mood, normal thought content      Assessment & Plan:   Problem List  Items Addressed This Visit    Post-nasal drainage - Primary    Describes thick post nasal drainage present for last several months without significant symptoms of bacterial infection. Anticipate allergy related more than ongoing bacterial infection. Discussed symptomatic treatment - rec restart antihistamine, flonase, nasal saline irrigation, guaifenesin expectorant PRN. Discussed possible addition of singulair. Update with effect.           Meds ordered this encounter  Medications  . fluticasone (FLONASE) 50 MCG/ACT nasal spray    Sig: Place 2 sprays into both nostrils daily.    Dispense:  16 g    Refill:  3  . loratadine (CLARITIN) 10 MG tablet    Sig: Take 1 tablet (10 mg total) by mouth daily.    Dispense:  30 tablet    Refill:  3  . guaiFENesin 200 MG tablet    Sig: Take 1 tablet (200 mg total) by mouth every 4 (four) hours as needed for cough or to loosen phlegm.    Dispense:  30 tablet    Refill:  1   No orders of the defined types were placed in this encounter.   I discussed the assessment and treatment plan with the patient. The patient was provided an opportunity to ask questions and all were answered. The patient agreed with the plan and demonstrated an understanding of the instructions. The patient was advised to call back or seek an in-person evaluation if the symptoms worsen or if the condition fails to improve as anticipated.  Follow up plan: Return if symptoms worsen or fail to improve.  Ria Bush, MD

## 2019-09-25 NOTE — Assessment & Plan Note (Addendum)
Describes thick post nasal drainage present for last several months without significant symptoms of bacterial infection. Anticipate allergy related more than ongoing bacterial infection. Discussed symptomatic treatment - rec restart antihistamine, flonase, nasal saline irrigation, guaifenesin expectorant PRN. Discussed possible addition of singulair. Update with effect.

## 2019-10-27 ENCOUNTER — Other Ambulatory Visit: Payer: Self-pay

## 2019-10-27 MED ORDER — LISINOPRIL 40 MG PO TABS: 40.0000 mg | ORAL_TABLET | Freq: Every day | ORAL | 1 refills | Status: DC

## 2019-10-27 NOTE — Telephone Encounter (Signed)
Pt requesting refill for lisinopril 40 mg to [facility]. Pt will be out of med his weekend. Last annual 07/17/19.refilled lisinopril 40 mg # 90 x 1 [facility]; pt appreciative.

## 2019-10-31 ENCOUNTER — Encounter: Payer: Self-pay | Admitting: Pain Medicine

## 2019-11-01 ENCOUNTER — Other Ambulatory Visit: Payer: Self-pay

## 2019-11-01 ENCOUNTER — Ambulatory Visit: Payer: Medicare Other | Attending: Pain Medicine | Admitting: Pain Medicine

## 2019-11-01 DIAGNOSIS — G894 Chronic pain syndrome: Secondary | ICD-10-CM

## 2019-11-01 DIAGNOSIS — M25511 Pain in right shoulder: Secondary | ICD-10-CM

## 2019-11-01 DIAGNOSIS — R790 Abnormal level of blood mineral: Secondary | ICD-10-CM | POA: Diagnosis not present

## 2019-11-01 DIAGNOSIS — M549 Dorsalgia, unspecified: Secondary | ICD-10-CM | POA: Diagnosis not present

## 2019-11-01 DIAGNOSIS — M25551 Pain in right hip: Secondary | ICD-10-CM | POA: Diagnosis not present

## 2019-11-01 DIAGNOSIS — M25512 Pain in left shoulder: Secondary | ICD-10-CM

## 2019-11-01 DIAGNOSIS — M545 Low back pain, unspecified: Secondary | ICD-10-CM

## 2019-11-01 DIAGNOSIS — G8929 Other chronic pain: Secondary | ICD-10-CM

## 2019-11-01 MED ORDER — MAGNESIUM OXIDE -MG SUPPLEMENT 500 MG PO CAPS: 1.0000 | ORAL_CAPSULE | Freq: Every day | ORAL | 3 refills | Status: DC

## 2019-11-01 MED ORDER — OXYCODONE HCL 5 MG PO TABS: 5.0000 mg | ORAL_TABLET | Freq: Four times a day (QID) | ORAL | 0 refills | Status: DC | PRN

## 2019-11-01 NOTE — Progress Notes (Signed)
Pain Management Virtual Encounter Note - Virtual Visit via Telephone Telehealth (real-time audio visits between healthcare provider and patient).   Patient's Phone No. & Preferred Pharmacy:  [phone] (home); [phone] (mobile); (Preferred) [phone] [email or web address]  [id number] Lorina Rabon, [facility] [address] [place] [address] Phone: [phone] Fax: [phone]    Pre-screening note:  Our staff contacted Larry Goodwin and offered him an "in person", "face-to-face" appointment versus a telephone encounter. He indicated preferring the telephone encounter, at this time.   Reason for Virtual Visit: COVID-19*  Social distancing based on CDC and AMA recommendations.   I contacted Larry Goodwin on 11/01/2019 via telephone.      I clearly identified myself as Gaspar Cola, MD. I verified that I was speaking with the correct person using two identifiers (Name: Larry Goodwin, and date of birth: 01-02-1976).  Advanced Informed Consent I sought verbal advanced consent from Larry Goodwin for virtual visit interactions. I informed Larry Goodwin of possible security and privacy concerns, risks, and limitations associated with providing "not-in-person" medical evaluation and management services. I also informed Larry Goodwin of the availability of "in-person" appointments. Finally, I informed him that there would be a charge for the virtual visit and that he could be  personally, fully or partially, financially responsible for it. Larry Goodwin expressed understanding and agreed to proceed.   Historic Elements   Larry Goodwin is a 43 y.o. year old, male patient evaluated today after his last encounter by our practice on 08/02/2019. Larry Goodwin  has a past medical history of Bell's palsy (2015), Chronic pain syndrome, DDD (degenerative disc disease), lumbar, HTN (hypertension), and Morbid obesity ([place]) (08/16/2014). He also  has a past  surgical history that includes Back surgery (2012) and Cholecystectomy. Larry Goodwin has a current medication list which includes the following prescription(s): fluticasone, lisinopril, oxycodone, oxycodone, oxycodone, vitamin b-12, vitamin d3, and magnesium oxide. He  reports that he quit smoking about 11 years ago. He has never used smokeless tobacco. He reports current drug use. Drug: Oxycodone. He reports that he does not drink alcohol. Larry Goodwin is allergic to levaquin [levofloxacin in d5w].   HPI  Today, he is being contacted for medication management.  The patient indicates doing well with the current medication regimen. No adverse reactions or side effects reported to the medications.   Pharmacotherapy Assessment  Analgesic: Oxycodone IR 5 mg, 1 tab PO q 6 hrs (20 mg/day of oxycodone) MME/day:30 mg/day.   Monitoring: Pharmacotherapy: No side-effects or adverse reactions reported. [place] PMP: PDMP reviewed during this encounter.       Compliance: No problems identified. Effectiveness: Clinically acceptable. Plan: Refer to "POC".  UDS:  Summary  Date Value Ref Range Status  02/06/2019 FINAL  Final    Comment:    ==================================================================== TOXASSURE SELECT 13 (MW) ==================================================================== Test                             Result       Flag       Units Drug Present and Declared for Prescription Verification   Oxycodone                      722          EXPECTED   ng/mg creat   Oxymorphone  576          EXPECTED   ng/mg creat   Noroxycodone                   921          EXPECTED   ng/mg creat   Noroxymorphone                 116          EXPECTED   ng/mg creat    Sources of oxycodone are scheduled prescription medications.    Oxymorphone, noroxycodone, and noroxymorphone are expected    metabolites of oxycodone. Oxymorphone is also available as a    scheduled prescription  medication. ==================================================================== Test                      Result    Flag   Units      Ref Range   Creatinine              109              mg/dL      >=20 ==================================================================== Declared Medications:  The flagging and interpretation on this report are based on the  following declared medications.  Unexpected results may arise from  inaccuracies in the declared medications.  **Note: The testing scope of this panel includes these medications:  Oxycodone  **Note: The testing scope of this panel does not include following  reported medications:  Cholecalciferol  Cyanocobalamin  Lisinopril  Magnesium Oxide ==================================================================== For clinical consultation, please call [phone]. ====================================================================    Laboratory Chemistry Profile (12 mo)  Renal: 02/06/2019: BUN/Creatinine Ratio 11 07/19/2019: BUN 14; Creatinine, Ser 0.85  Lab Results  Component Value Date   GFR 97.98 07/19/2019   GFRAA 129 02/06/2019   GFRNONAA 112 02/06/2019   Hepatic: 07/19/2019: Albumin 4.3 Lab Results  Component Value Date   AST 15 07/19/2019   ALT 11 07/19/2019   Other: 02/06/2019: 25-Hydroxy, Vitamin D 35; 25-Hydroxy, Vitamin D-2 <1.0; 25-Hydroxy, Vitamin D-3 34; CRP 9; Sed Rate 6 07/19/2019: Vitamin B-12 544; VITD 48.99 Note: Above Lab results reviewed.  Imaging  Last 90 days:  No results found.  Assessment  The primary encounter diagnosis was Chronic pain syndrome. Diagnoses of Chronic low back pain (Primary Area of Pain) (Bilateral) (R>L), Chronic hip pain (Secondary area of Pain) (Right), Chronic upper back pain (Third area of Pain), Chronic shoulder pain (Fourth area of Pain) (Bilateral) (Right), and Low magnesium levels were also pertinent to this visit.  Plan of Care  I have discontinued Larry Goodwin. Larry Goodwin's  omeprazole, loratadine, and guaiFENesin. I am also having him start on oxyCODONE and oxyCODONE. Additionally, I am having him maintain his vitamin B-12, Vitamin D3, fluticasone, lisinopril, Magnesium Oxide, and oxyCODONE.  Pharmacotherapy (Medications Ordered): Meds ordered this encounter  Medications  . Magnesium Oxide 500 MG CAPS    Sig: Take 1 capsule (500 mg total) by mouth daily.    Dispense:  90 capsule    Refill:  3    Fill one day early if pharmacy is closed on scheduled refill date. May substitute for generic if available.  Marland Kitchen oxyCODONE (OXY IR/ROXICODONE) 5 MG immediate release tablet    Sig: Take 1 tablet (5 mg total) by mouth every 6 (six) hours as needed for severe pain. Must last 30 days    Dispense:  120 tablet    Refill:  0    Chronic Pain: STOP  Act (Not applicable) Fill 1 day early if closed on refill date. Do not fill until: 11/07/2019. To last until: 12/07/2019. Avoid benzodiazepines within 8 hours of opioids  . oxyCODONE (OXY IR/ROXICODONE) 5 MG immediate release tablet    Sig: Take 1 tablet (5 mg total) by mouth every 6 (six) hours as needed for severe pain. Must last 30 days    Dispense:  120 tablet    Refill:  0    Chronic Pain: STOP Act (Not applicable) Fill 1 day early if closed on refill date. Do not fill until: 12/07/2019. To last until: 01/06/2020. Avoid benzodiazepines within 8 hours of opioids  . oxyCODONE (OXY IR/ROXICODONE) 5 MG immediate release tablet    Sig: Take 1 tablet (5 mg total) by mouth every 6 (six) hours as needed for severe pain. Must last 30 days    Dispense:  120 tablet    Refill:  0    Chronic Pain: STOP Act (Not applicable) Fill 1 day early if closed on refill date. Do not fill until: 01/06/2020. To last until: 02/05/2020. Avoid benzodiazepines within 8 hours of opioids   Orders:  No orders of the defined types were placed in this encounter.  Follow-up plan:   Return in about 3 months (around 02/05/2020) for (VV), (MM).      Interventional  treatment options: Planned, scheduled, and/or pending:   Not at this time.   Under consideration:   Diagnostic right CESI  Diagnostic thoracic ESI  Diagnostic bilateral thoracic facet block  Possible bilateral thoracic facet RFA  Diagnostic bilateral lumbar facet block  Possible bilateral lumbar facet RFA  Diagnostic right sacroiliac joint block  Possible right sacroiliac joint RFA  Diagnostic caudal ESI and epidurogram  Diagnostic right-sided L4-5 TFESI  Diagnostic right IA knee injection  Diagnostic right genicular NB  Possible right genicular RFA    Therapeutic/palliative (PRN):   None at this time    Recent Visits No visits were found meeting these conditions.  Showing recent visits within past 90 days and meeting all other requirements   Today's Visits Date Type Provider Dept  11/01/19 Telemedicine Milinda Pointer, MD [facility]  Showing today's visits and meeting all other requirements   Future Appointments No visits were found meeting these conditions.  Showing future appointments within next 90 days and meeting all other requirements   I discussed the assessment and treatment plan with the patient. The patient was provided an opportunity to ask questions and all were answered. The patient agreed with the plan and demonstrated an understanding of the instructions.  Patient advised to call back or seek an in-person evaluation if the symptoms or condition worsens.  Total duration of non-face-to-face encounter: 12 minutes.  Note by: Gaspar Cola, MD Date: 11/01/2019; Time: 12:57 PM  Note: This dictation was prepared with Dragon dictation. Any transcriptional errors that may result from this process are unintentional.  Disclaimer:  * Given the special circumstances of the COVID-19 pandemic, the federal government has announced that the Office for Civil Rights (OCR) will exercise its enforcement discretion and will not impose penalties on  physicians using telehealth in the event of noncompliance with regulatory requirements under the [place] (HIPAA) in connection with the good faith provision of telehealth during the XX123456 national public health emergency. ([place])

## 2020-01-16 ENCOUNTER — Encounter: Payer: Self-pay | Admitting: Family Medicine

## 2020-01-16 ENCOUNTER — Telehealth (INDEPENDENT_AMBULATORY_CARE_PROVIDER_SITE_OTHER): Payer: Medicare Other | Admitting: Family Medicine

## 2020-01-16 VITALS — BP 145/103 | HR 94 | Temp 98.0°F | Ht 73.0 in

## 2020-01-16 DIAGNOSIS — R1013 Epigastric pain: Secondary | ICD-10-CM

## 2020-01-16 DIAGNOSIS — N528 Other male erectile dysfunction: Secondary | ICD-10-CM | POA: Diagnosis not present

## 2020-01-16 DIAGNOSIS — I10 Essential (primary) hypertension: Secondary | ICD-10-CM

## 2020-01-16 DIAGNOSIS — J0110 Acute frontal sinusitis, unspecified: Secondary | ICD-10-CM | POA: Diagnosis not present

## 2020-01-16 DIAGNOSIS — Z6835 Body mass index (BMI) 35.0-35.9, adult: Secondary | ICD-10-CM

## 2020-01-16 MED ORDER — AMOXICILLIN-POT CLAVULANATE 875-125 MG PO TABS: 1.0000 | ORAL_TABLET | Freq: Two times a day (BID) | ORAL | 0 refills | Status: AC

## 2020-01-16 MED ORDER — OMEPRAZOLE 40 MG PO CPDR: 40.0000 mg | DELAYED_RELEASE_CAPSULE | Freq: Every day | ORAL | 1 refills | Status: DC

## 2020-01-16 NOTE — Progress Notes (Signed)
Virtual visit completed through [place]. Due to national recommendations of social distancing due to COVID-19, a virtual visit is felt to be most appropriate for this patient at this time. Reviewed limitations of a virtual visit.   Patient location: home Provider location: [place] [facility], office If any vitals were documented, they were collected by patient at home unless specified below.    BP (!) 145/103   Pulse 94   Temp 98 F (36.7 C) (Temporal)   Ht 6\' 1"  (1.854 m)   BMI 34.30 kg/m    CC: ongoing sinus congestion, GI symptoms  Subjective:    Patient ID: Larry Goodwin, male    DOB: 1976/09/17, 44 y.o.   MRN: [id number]  HPI: Larry Goodwin is a 44 y.o. male presenting on 01/16/2020 for Sinus Problem (C/o sinus pressure, bilateral ear pressure and facial pain.  Sxs started about 1 mo ago.  H/o sinus inf. ) and GI Problem (C/o excessive belching and BM changes after eating certain foods.  Seen for sxs previously.  Denies N/V/D. )   Ongoing sinus pressure, bilateral ear pressure, facial pain for the past month. Last night felt dizziness and worsening pain at back of neck. Weakness and fatigue. Blowing nose with green mucous.  No fevers/chills, cough, loss of taste/smell, dyspnea.  Persistent nagging cough since wife's covid illness - he doesn't think he had covid (never tested).  Last sinusitis treatment 07/2019 with 10d augmentin course.   Seen 09/2019 - though symptoms more allergy related treated with flonase, mucinex - seemed to get better.   Currently using nasal saline.   07/2019 - sinusitis treated with augmentin course - never fully improved, did not take doxy course prescribed.  03/2019 - sinusitis treated with augmentin course 11/2018 - sinusitis treated with augmentin course 08/2018 - sinusitis treated with augmentin course And more over the years.  Does fully improve after abx course.   Referred toENT8/2020 - has not been able to see due to outstanding  bill.  Ongoing GI symptoms since summer 2020. Initially improved with avoidance of grease foods. He had worked on regular exercise routine and lost significant weight intentionally. No recent weight in last few weeks. Didn't take omeprazole regularly but only as needed. Bowel changes - regular formed to looser stools. Never watery, never very constipated. No blood in stools. He's actually gained weight recently - doesn't have a scale at home. Has restarted low carb diet.   Fiancee worried he has crohn's diease - he actually separated from fiancee.   Noticing increasing ED - trouble maintaining erection. Declines viagra for now.      Relevant past medical, surgical, family and social history reviewed and updated as indicated. Interim medical history since our last visit reviewed. Allergies and medications reviewed and updated. Outpatient Medications Prior to Visit  Medication Sig Dispense Refill  . Cholecalciferol (VITAMIN D3) 50 MCG (2000 UT) capsule Take 2 capsules (4,000 Units total) by mouth daily.    Marland Kitchen lisinopril (ZESTRIL) 40 MG tablet Take 1 tablet (40 mg total) by mouth daily. 90 tablet 1  . Magnesium Oxide 500 MG CAPS Take 1 capsule (500 mg total) by mouth daily. 90 capsule 3  . oxyCODONE (OXY IR/ROXICODONE) 5 MG immediate release tablet Take 1 tablet (5 mg total) by mouth every 6 (six) hours as needed for severe pain. Must last 30 days 120 tablet 0  . vitamin B-12 (CYANOCOBALAMIN) 1000 MCG tablet Take 1,000 mcg by mouth daily.    Marland Kitchen  oxyCODONE (OXY IR/ROXICODONE) 5 MG immediate release tablet Take 1 tablet (5 mg total) by mouth every 6 (six) hours as needed for severe pain. Must last 30 days 120 tablet 0  . fluticasone (FLONASE) 50 MCG/ACT nasal spray Place 2 sprays into both nostrils daily. 16 g 3  . oxyCODONE (OXY IR/ROXICODONE) 5 MG immediate release tablet Take 1 tablet (5 mg total) by mouth every 6 (six) hours as needed for severe pain. Must last 30 days 120 tablet 0   No  facility-administered medications prior to visit.     Per HPI unless specifically indicated in ROS section below Review of Systems Objective:    BP (!) 145/103   Pulse 94   Temp 98 F (36.7 C) (Temporal)   Ht 6\' 1"  (1.854 m)   BMI 34.30 kg/m   Wt Readings from Last 3 Encounters:  07/17/19 260 lb (117.9 kg)  03/27/19 270 lb (122.5 kg)  02/06/19 280 lb (127 kg)     Physical exam: Gen: alert, NAD, not ill appearing Pulm: speaks in complete sentences without increased work of breathing Psych: normal mood, normal thought content      Assessment & Plan:   Problem List Items Addressed This Visit    Severe obesity (BMI 35.0-35.9 with comorbidity) ([place])    Endorses recent weight gain in setting of stressful life events - motivated to restart low carb diet and regular walking routine.       HTN (hypertension)    BP elevated today - anticipate due to acute illness - advised monitor at home and let me know if consistently elevated to titrate antihypertensive regimen.       Erectile dysfunction    Noticing increasing ED - trouble maintaining erection. Declines viagra for now but would like to have this documented in case need arises in the future.  H/o psychologic ED.      Epigastric pain    Recurrent GI symptoms after diet deteriorated - anticipate IBS vs GERD - rec 3 wk omeprazole course. If ongoing past this, return for in-office visit for further eval, labs, consider GI imaging. Pt agrees with plan.       Acute sinusitis - Primary    Ongoing symptoms for the past month - anticipate component of bacterial sinusitis - will treat with 10d augmentin course. I also recommended he start flonase daily. Update if persistent symptoms past treatment.       Relevant Medications   amoxicillin-clavulanate (AUGMENTIN) 875-125 MG tablet       Meds ordered this encounter  Medications  . omeprazole (PRILOSEC) 40 MG capsule    Sig: Take 1 capsule (40 mg total) by mouth daily. Daily for  3 weeks then as needed    Dispense:  30 capsule    Refill:  1  . amoxicillin-clavulanate (AUGMENTIN) 875-125 MG tablet    Sig: Take 1 tablet by mouth 2 (two) times daily for 10 days.    Dispense:  20 tablet    Refill:  0   No orders of the defined types were placed in this encounter.   I discussed the assessment and treatment plan with the patient. The patient was provided an opportunity to ask questions and all were answered. The patient agreed with the plan and demonstrated an understanding of the instructions. The patient was advised to call back or seek an in-person evaluation if the symptoms worsen or if the condition fails to improve as anticipated.  Follow up plan: No follow-ups on file.  Ria Bush, MD

## 2020-01-17 ENCOUNTER — Telehealth: Payer: Medicare Other | Admitting: Family Medicine

## 2020-01-17 NOTE — Assessment & Plan Note (Signed)
Endorses recent weight gain in setting of stressful life events - motivated to restart low carb diet and regular walking routine.

## 2020-01-17 NOTE — Assessment & Plan Note (Signed)
BP elevated today - anticipate due to acute illness - advised monitor at home and let me know if consistently elevated to titrate antihypertensive regimen.

## 2020-01-17 NOTE — Assessment & Plan Note (Signed)
Noticing increasing ED - trouble maintaining erection. Declines viagra for now but would like to have this documented in case need arises in the future.  H/o psychologic ED.

## 2020-01-17 NOTE — Assessment & Plan Note (Signed)
Recurrent GI symptoms after diet deteriorated - anticipate IBS vs GERD - rec 3 wk omeprazole course. If ongoing past this, return for in-office visit for further eval, labs, consider GI imaging. Pt agrees with plan.

## 2020-01-17 NOTE — Assessment & Plan Note (Signed)
Ongoing symptoms for the past month - anticipate component of bacterial sinusitis - will treat with 10d augmentin course. I also recommended he start flonase daily. Update if persistent symptoms past treatment.

## 2020-02-04 NOTE — Progress Notes (Signed)
Patient: Larry Goodwin  Service Category: E/Larry  Provider: Gaspar Cola, MD  DOB: September 26, 1976  DOS: 02/05/2020  Location: Office  MRN: [id number]  Setting: Ambulatory outpatient  Referring Provider: Ria Bush, MD  Type: Established Patient  Specialty: Interventional Pain Management  PCP: Ria Bush, MD  Location: Remote location  Delivery: TeleHealth     Virtual Encounter - Pain Management PROVIDER NOTE: Information contained herein reflects review and annotations entered in association with encounter. Interpretation of such information and data should be left to medically-trained personnel. Information provided to patient can be located elsewhere in the medical record under "Patient Instructions". Document created using STT-dictation technology, any transcriptional errors that may result from process are unintentional.    Contact & Pharmacy Preferred: [place]: [phone] (home) Mobile: [phone] (mobile) E-mail: [email or web address]  [id number]: [phone]: [phone]  Pre-screening  Mr. MSheriffoffered "in-person" vs "virtual" encounter. He indicated preferring virtual for this encounter.   Reason COVID-19*  Social distancing based on CDC and AMA recommendations.   I contacted MNatale Goodwin 02/05/2020 via telephone.      I clearly identified myself as FGaspar Cola MD. I verified that I was speaking with the correct person using two identifiers (Name: MDEVARIS QUIRK and date of birth: [birth year].  Consent I sought verbal advanced consent from MNatale Layfor virtual visit interactions. I informed Mr. MSontagof possible security and privacy concerns, risks, and limitations associated with providing "not-in-person" medical evaluation and management services. I also informed Mr. MGarberof the availability of "in-person"  appointments. Finally, I informed him that there would be a charge for the virtual visit and that he could be  personally, fully or partially, financially responsible for it. Mr. MKirkpatrickexpressed understanding and agreed to proceed.   Historic Elements   Mr. MCASTON COOPERSMITHis a 44y.o. year old, male patient evaluated today after his last contact with our practice on Visit date not found. Mr. MMazon has a past medical history of Bell's palsy (2015), Chronic pain syndrome, DDD (degenerative disc disease), lumbar, HTN (hypertension), and Morbid obesity ([place] (08/16/2014). He also  has a past surgical history that includes Back surgery (2012) and Cholecystectomy. Mr. MRazhas a current medication list which includes the following prescription(s): vitamin d3, lisinopril, magnesium oxide, omeprazole, oxycodone, [START ON 03/06/2020] oxycodone, [START ON 04/05/2020] oxycodone, and vitamin b-12. He  reports that he quit smoking about 12 years ago. He has never used smokeless tobacco. He reports current drug use. Drug: Oxycodone. He reports that he does not drink alcohol. Mr. MAustis allergic to levaquin [levofloxacin in d5w].   HPI  Today, he is being contacted for medication management.  The patient indicates doing well with the current medication regimen. No adverse reactions or side effects reported to the medications.   Pharmacotherapy Assessment  Analgesic: Oxycodone IR 5 mg, 1 tab PO q 6 hrs (20 mg/day of oxycodone) MME/day:30 mg/day.   Monitoring: [place] PMP: PDMP reviewed during this encounter.       Pharmacotherapy: No side-effects or adverse reactions reported. Compliance: No problems identified. Effectiveness: Clinically acceptable. Plan: Refer to "POC".  UDS:  Summary  Date Value Ref Range Status  02/06/2019 FINAL  Final    Comment:    ==================================================================== TOXASSURE SELECT 13  (MW) ==================================================================== Test  Result       Flag       Units Drug Present and Declared for Prescription Verification   Oxycodone                      722          EXPECTED   ng/mg creat   Oxymorphone                    576          EXPECTED   ng/mg creat   Noroxycodone                   921          EXPECTED   ng/mg creat   Noroxymorphone                 116          EXPECTED   ng/mg creat    Sources of oxycodone are scheduled prescription medications.    Oxymorphone, noroxycodone, and noroxymorphone are expected    metabolites of oxycodone. Oxymorphone is also available as a    scheduled prescription medication. ==================================================================== Test                      Result    Flag   Units      Ref Range   Creatinine              109              mg/dL      >=20 ==================================================================== Declared Medications:  The flagging and interpretation on this report are based on the  following declared medications.  Unexpected results may arise from  inaccuracies in the declared medications.  **Note: The testing scope of this panel includes these medications:  Oxycodone  **Note: The testing scope of this panel does not include following  reported medications:  Cholecalciferol  Cyanocobalamin  Lisinopril  Magnesium Oxide ==================================================================== For clinical consultation, please call [phone]. ====================================================================    Laboratory Chemistry Profile   Renal Lab Results  Component Value Date   BUN 14 07/19/2019   CREATININE 0.85 07/19/2019   BCR 11 02/06/2019   GFR 97.98 07/19/2019   GFRAA 129 02/06/2019   GFRNONAA 112 02/06/2019    Hepatic Lab Results  Component Value Date   AST 15 07/19/2019   ALT 11 07/19/2019   ALBUMIN 4.3  07/19/2019   ALKPHOS 45 07/19/2019   LIPASE 153 09/21/2012    Electrolytes Lab Results  Component Value Date   NA 141 07/19/2019   K 4.4 07/19/2019   CL 104 07/19/2019   CALCIUM 9.2 07/19/2019   MG 1.9 07/19/2019    Bone Lab Results  Component Value Date   VD25OH 48.99 07/19/2019   25OHVITD1 35 02/06/2019   25OHVITD2 <1.0 02/06/2019   25OHVITD3 34 02/06/2019    Inflammation (CRP: Acute Phase) (ESR: Chronic Phase) Lab Results  Component Value Date   CRP 9 02/06/2019   ESRSEDRATE 6 02/06/2019      Note: Above Lab results reviewed.  Imaging  DG Foot Complete Right  REASON FOR EXAM:    pain, trauma  COMMENTS:   May transport without cardiac monitor   PROCEDURE:     DXR - DXR FOOT RT COMPLETE W/OBLIQUES  - Mar 24 2012   8:54AM   RESULT:     No fracture, dislocation or other acute bony  abnormality is  identified. There is noted deformity of the base of the fifth metatarsal  compatible with residual change from a prior fracture that has healed.   IMPRESSION:  1.     No acute bony abnormalities are identified.   Thank you for the opportunity to contribute to the care of your patient.     Assessment  The encounter diagnosis was Chronic pain syndrome.  Plan of Care  Problem-specific:  No problem-specific Assessment & Plan notes found for this encounter.  Larry Goodwin has a current medication list which includes the following long-term medication(s): vitamin d3, lisinopril, magnesium oxide, omeprazole, oxycodone, [START ON 03/06/2020] oxycodone, and [START ON 04/05/2020] oxycodone.  Pharmacotherapy (Medications Ordered): Meds ordered this encounter  Medications  . oxyCODONE (OXY IR/ROXICODONE) 5 MG immediate release tablet    Sig: Take 1 tablet (5 mg total) by mouth every 6 (six) hours as needed for severe pain. Must last 30 days    Dispense:  120 tablet    Refill:  0    Chronic Pain: STOP Act (Not applicable) Fill 1 day early if closed on refill date. Do  not fill until: 02/05/2020. To last until: 03/06/2020. Avoid benzodiazepines within 8 hours of opioids  . oxyCODONE (OXY IR/ROXICODONE) 5 MG immediate release tablet    Sig: Take 1 tablet (5 mg total) by mouth every 6 (six) hours as needed for severe pain. Must last 30 days    Dispense:  120 tablet    Refill:  0    Chronic Pain: STOP Act (Not applicable) Fill 1 day early if closed on refill date. Do not fill until: 03/06/2020. To last until: 04/05/2020. Avoid benzodiazepines within 8 hours of opioids  . oxyCODONE (OXY IR/ROXICODONE) 5 MG immediate release tablet    Sig: Take 1 tablet (5 mg total) by mouth every 6 (six) hours as needed for severe pain. Must last 30 days    Dispense:  120 tablet    Refill:  0    Chronic Pain: STOP Act (Not applicable) Fill 1 day early if closed on refill date. Do not fill until: 04/05/2020. To last until: 05/05/2020. Avoid benzodiazepines within 8 hours of opioids   Orders:  No orders of the defined types were placed in this encounter.  Follow-up plan:   Return in about 3 months (around 05/01/2020) for (VV), (MM).      Interventional treatment options: Planned, scheduled, and/or pending:   Not at this time.   Under consideration:   Diagnostic right CESI  Diagnostic thoracic ESI  Diagnostic bilateral thoracic facet block  Possible bilateral thoracic facet RFA  Diagnostic bilateral lumbar facet block  Possible bilateral lumbar facet RFA  Diagnostic right sacroiliac joint block  Possible right sacroiliac joint RFA  Diagnostic caudal ESI and epidurogram  Diagnostic right-sided L4-5 TFESI  Diagnostic right IA knee injection  Diagnostic right genicular NB  Possible right genicular RFA    Therapeutic/palliative (PRN):   None at this time     Recent Visits No visits were found meeting these conditions.  Showing recent visits within past 90 days and meeting all other requirements   Today's Visits Date Type Provider Dept  02/05/20 Telemedicine Milinda Pointer, MD [facility]  Showing today's visits and meeting all other requirements   Future Appointments No visits were found meeting these conditions.  Showing future appointments within next 90 days and meeting all other requirements   I discussed the assessment and treatment plan with the patient.  The patient was provided an opportunity to ask questions and all were answered. The patient agreed with the plan and demonstrated an understanding of the instructions.  Patient advised to call back or seek an in-person evaluation if the symptoms or condition worsens.  Duration of encounter: 12 minutes.  Note by: Gaspar Cola, MD Date: 02/05/2020; Time: 8:38 AM

## 2020-02-05 ENCOUNTER — Ambulatory Visit: Payer: Medicare Other | Attending: Pain Medicine | Admitting: Pain Medicine

## 2020-02-05 ENCOUNTER — Other Ambulatory Visit: Payer: Self-pay

## 2020-02-05 DIAGNOSIS — G894 Chronic pain syndrome: Secondary | ICD-10-CM | POA: Diagnosis not present

## 2020-02-05 MED ORDER — OXYCODONE HCL 5 MG PO TABS: 5.0000 mg | ORAL_TABLET | Freq: Four times a day (QID) | ORAL | 0 refills | Status: DC | PRN

## 2020-02-12 ENCOUNTER — Emergency Department
Admission: EM | Admit: 2020-02-12 | Discharge: 2020-02-12 | Disposition: A | Discharge status: Home / Self Care | Payer: Medicare Other | Attending: Emergency Medicine | Admitting: Emergency Medicine

## 2020-02-12 ENCOUNTER — Other Ambulatory Visit: Payer: Self-pay

## 2020-02-12 ENCOUNTER — Emergency Department: Payer: Medicare Other

## 2020-02-12 ENCOUNTER — Encounter: Payer: Self-pay | Admitting: *Deleted

## 2020-02-12 DIAGNOSIS — S99921A Unspecified injury of right foot, initial encounter: Secondary | ICD-10-CM | POA: Diagnosis present

## 2020-02-12 DIAGNOSIS — S86019A Strain of unspecified Achilles tendon, initial encounter: Secondary | ICD-10-CM | POA: Insufficient documentation

## 2020-02-12 DIAGNOSIS — I10 Essential (primary) hypertension: Secondary | ICD-10-CM | POA: Diagnosis not present

## 2020-02-12 DIAGNOSIS — F1110 Opioid abuse, uncomplicated: Secondary | ICD-10-CM | POA: Insufficient documentation

## 2020-02-12 DIAGNOSIS — Y998 Other external cause status: Secondary | ICD-10-CM | POA: Diagnosis not present

## 2020-02-12 DIAGNOSIS — Y92838 Other recreation area as the place of occurrence of the external cause: Secondary | ICD-10-CM | POA: Insufficient documentation

## 2020-02-12 DIAGNOSIS — S86011A Strain of right Achilles tendon, initial encounter: Secondary | ICD-10-CM | POA: Diagnosis not present

## 2020-02-12 DIAGNOSIS — X509XXA Other and unspecified overexertion or strenuous movements or postures, initial encounter: Secondary | ICD-10-CM | POA: Diagnosis not present

## 2020-02-12 DIAGNOSIS — Y9301 Activity, walking, marching and hiking: Secondary | ICD-10-CM | POA: Diagnosis not present

## 2020-02-12 DIAGNOSIS — Z87891 Personal history of nicotine dependence: Secondary | ICD-10-CM | POA: Diagnosis not present

## 2020-02-12 DIAGNOSIS — M7731 Calcaneal spur, right foot: Secondary | ICD-10-CM | POA: Diagnosis not present

## 2020-02-12 DIAGNOSIS — Z79899 Other long term (current) drug therapy: Secondary | ICD-10-CM | POA: Diagnosis not present

## 2020-02-12 NOTE — ED Triage Notes (Signed)
Pt has pain in right foot.  Pt reports jarring his foot 3 days ago.  Pt did not fall.  Pt alert  Speech clear.

## 2020-02-12 NOTE — Discharge Instructions (Addendum)
Based on your history and exam you suffered a Achilles tendon strain.  There is no significant tear to the tendon.  Please stay nonweightbearing with use of crutches or knee scooter until you are able to ambulate without pain or a limp.  Once you begin bearing weight, you will need to use a walking boot to help with ambulating, this will help prevent strain on the Achilles tendon.  It will be beneficial to place a small wedge in the heel of the walking boot to elevate the heel and relax the tendon.  If no improvement in 2 weeks, follow-up with Dr. Mack Guise.  Take ibuprofen as needed for pain swelling and inflammation.

## 2020-02-12 NOTE — ED Provider Notes (Signed)
[facility] EMERGENCY DEPARTMENT Provider Note   CSN: [id number] Arrival date & time: 02/12/20  1736     History Chief Complaint  Patient presents with  . Foot Injury    Larry Goodwin is a 44 y.o. male presents to the emergency department for evaluation of right heel pain.  Patient states 3 days ago he was jumping onto a stepping stone, felt a soreness in his Achilles tendon just above the calcaneus, this morning noticed severe pain with attempted weightbearing along the Achilles tendon.  He denies any numbness, tingling, warmth, redness.  No other trauma or injury.  Has been taken ibuprofen with little relief.  HPI     Past Medical History:  Diagnosis Date  . Bell's palsy 2015   right then left sides  . Chronic pain syndrome    s/p 2 back surgeries [facility] Gammon)  . DDD (degenerative disc disease), lumbar    severe s/p 2 surgeries  . HTN (hypertension)   . Morbid obesity ([place]) 08/16/2014    Patient Active Problem List   Diagnosis Date Noted  . Post-nasal drainage 07/18/2019  . Epigastric pain 05/04/2019  . Pharmacologic therapy 02/06/2019  . Disorder of skeletal system 02/06/2019  . Problems influencing health status 02/06/2019  . Internal nasal lesion 07/12/2018  . Advanced care planning/counseling discussion 07/12/2018  . Erectile dysfunction 01/11/2018  . Vitamin B12 deficiency 02/04/2017  . Chronic hip pain (Secondary area of Pain) (Right) 11/20/2016  . Chronic shoulder pain (Fourth area of Pain) (Bilateral) (Right) 11/20/2016  . Cervical IVDD (intervertebral disc displacement) 06/10/2016  . Cervical foraminal stenosis (C5-6 and C7-T1: Right) 06/10/2016  . Cervical central spinal stenosis (C5-6) 06/10/2016  . Thoracic IVDD (intervertebral disc displacement) 06/10/2016  . Thoracic facet arthropathy 06/10/2016  . Thoracic facet syndrome ([place]) 06/10/2016  . Lumbar IVDD (intervertebral disc displacement) 06/10/2016  . Lumbar foraminal stenosis  (Right L4-5) 06/10/2016  . Lumbar central spinal stenosis (L3-4 and L4-5) 06/10/2016  . Chronic knee pain (Right) 06/10/2016  . Osteoarthritis of knee (Right) 06/10/2016  . Disturbance of skin sensation 06/09/2016  . History of tobacco abuse 10/01/2015  . Low magnesium levels 10/01/2015  . Vitamin D deficiency 10/01/2015  . Myofascial pain syndrome 10/01/2015  . Lumbar facet syndrome (Bilateral) (R>L) 10/01/2015  . Chronic upper back pain (Third area of Pain) 10/01/2015  . Generalized anxiety disorder 10/01/2015  . Depression 10/01/2015  . Lumbar spondylosis with radiculopathy 10/01/2015  . Epidural fibrosis 10/01/2015  . 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 Encounter for therapeutic drug level monitoring 10/01/2015  . Long term prescription opiate use 10/01/2015  . Long term current use of opiate analgesic 10/01/2015  . Opiate use (30 MME/Day) 10/01/2015  . Chronic low back pain (Primary Area of Pain) (Bilateral) (R>L) 10/01/2015  . Chronic sacroiliac joint pain (Right) 10/01/2015  . Abnormal MRI, lumbar spine 10/01/2015  .  Abnormal CT myelogram of the thoracic spine 10/01/2015  . Thoracic disc herniation 10/01/2015  . Bulge of lumbar disc without myelopathy 10/01/2015  . Vertebral body hemangioma 10/01/2015  . History of type 2 diabetes mellitus 11/19/2014  . Chronic pain syndrome   . Acute sinusitis 08/16/2014  . Severe obesity (BMI 35.0-35.9 with comorbidity) ([place]) 08/16/2014  . Lumbar DDD (degenerative disc disease)   . HTN (hypertension)   . Bell's palsy     Past Surgical History:  Procedure Laterality Date  . BACK SURGERY  2012   x 2 [facility] Gammon)  . CHOLECYSTECTOMY  Family History  Problem Relation Age of Onset  . COPD Mother        smoker (deceased at 27)  . Hypertension Other   . Stroke Neg Hx   . Cancer Neg Hx   . Diabetes Neg Hx   . CAD Neg Hx     Social History   Tobacco Use  . Smoking status: Former Smoker    Quit date: 12/15/2007     Years since quitting: 12.1  . Smokeless tobacco: Never Used  . Tobacco comment: quit 6 years ago  Substance Use Topics  . Alcohol use: No    Alcohol/week: 0.0 standard drinks  . Drug use: Yes    Types: Oxycodone    Home Medications Prior to Admission medications   Medication Sig Start Date End Date Taking? Authorizing Provider  Cholecalciferol (VITAMIN D3) 50 MCG (2000 UT) capsule Take 2 capsules (4,000 Units total) by mouth daily. 12/06/18 01/31/20  Tonia Ghent, MD  lisinopril (ZESTRIL) 40 MG tablet Take 1 tablet (40 mg total) by mouth daily. 10/27/19   Ria Bush, MD  Magnesium Oxide 500 MG CAPS Take 1 capsule (500 mg total) by mouth daily. 11/01/19 10/31/20  Milinda Pointer, MD  omeprazole (PRILOSEC) 40 MG capsule Take 1 capsule (40 mg total) by mouth daily. Daily for 3 weeks then as needed 01/16/20   Ria Bush, MD  oxyCODONE (OXY IR/ROXICODONE) 5 MG immediate release tablet Take 1 tablet (5 mg total) by mouth every 6 (six) hours as needed for severe pain. Must last 30 days 02/05/20 03/06/20  Milinda Pointer, MD  oxyCODONE (OXY IR/ROXICODONE) 5 MG immediate release tablet Take 1 tablet (5 mg total) by mouth every 6 (six) hours as needed for severe pain. Must last 30 days 03/06/20 04/05/20  Milinda Pointer, MD  oxyCODONE (OXY IR/ROXICODONE) 5 MG immediate release tablet Take 1 tablet (5 mg total) by mouth every 6 (six) hours as needed for severe pain. Must last 30 days 04/05/20 05/05/20  Milinda Pointer, MD  vitamin B-12 (CYANOCOBALAMIN) 1000 MCG tablet Take 1,000 mcg by mouth daily.    [provider]    Allergies    Levaquin [levofloxacin in d5w]  Review of Systems   Review of Systems  Constitutional: Negative for fever.  Musculoskeletal: Positive for arthralgias and gait problem. Negative for back pain, joint swelling, myalgias and neck pain.  Skin: Negative for rash and wound.    Physical Exam Updated Vital Signs BP 133/80 (BP Location: Left  Arm)   Pulse 100   Temp 98.4 F (36.9 C) (Oral)   Resp 19   Ht 6\' 2"  (1.88 m)   Wt 117.9 kg   SpO2 98%   BMI 33.38 kg/m   Physical Exam Constitutional:      Appearance: He is well-developed.  HENT:     Head: Normocephalic and atraumatic.  Eyes:     Conjunctiva/sclera: Conjunctivae normal.  Cardiovascular:     Rate and Rhythm: Normal rate.  Pulmonary:     Effort: Pulmonary effort is normal. No respiratory distress.  Musculoskeletal:     Cervical back: Normal range of motion.     Comments: Examination of the right foot and ankle shows no swelling warmth erythema.  Tender 3 to 4 cm above the calcaneus along the Achilles tendon.  Negative Thompson's test, full ankle plantarflexion dorsiflexion with active range of motion.  Strength is normal with no weakness.  No noticeable swelling warmth or erythema.  Sensation is intact distally.  Negative Bevelyn Buckles'  sign.  Skin:    General: Skin is warm.     Findings: No rash.  Neurological:     Mental Status: He is alert and oriented to person, place, and time.  Psychiatric:        Behavior: Behavior normal.        Thought Content: Thought content normal.     ED Results / Procedures / Treatments   Labs (all labs ordered are listed, but only abnormal results are displayed) Labs Reviewed - No data to display  EKG None  Radiology DG Foot Complete Right  Result Date: 02/12/2020 CLINICAL DATA:  Jarring foot 3 days prior, no fall EXAM: RIGHT FOOT COMPLETE - 3+ VIEW COMPARISON:  Radiograph 03/24/2012 FINDINGS: No acute bony abnormality. Specifically, no fracture, subluxation, or dislocation. Mild degenerative changes are noted in the midfoot. Posterior calcaneal spur is present. Trace ankle effusion. Soft tissues are otherwise unremarkable IMPRESSION: No acute fracture or traumatic malalignment. Mild degenerative changes in the midfoot. Posterior calcaneal spur. Trace ankle effusion. Electronically Signed   By: Lovena Le M.D.   On: 02/12/2020  19:03    Procedures Procedures (including critical care time)  Medications Ordered in ED Medications - No data to display  ED Course  I have reviewed the triage vital signs and the nursing notes.  Pertinent labs & imaging results that were available during my care of the patient were reviewed by me and considered in my medical decision making (see chart for details).    MDM Rules/Calculators/A&P                      44 year old male with strain to the right Achilles tendon.  He is having some pain mostly with weightbearing and dorsiflexion.  No evidence for tendon rupture as he has normal active range of motion and a negative Thompson's test.  I recommended him to stay nonweightbearing, will give him crutches today.  He has a knee scooter at home.  He did not want to proceed with splinting today but will be purchasing a walking boot and place a heel insert to help hold the ankle into slight plantarflexion.  He will follow-up with orthopedics in 2 weeks if no improvement.  Return to the ER for any worsening symptoms or to change his health.  He will be taking ibuprofen every 6 hours as needed with food. Final Clinical Impression(s) / ED Diagnoses Final diagnoses:  Strain of Achilles tendon, initial encounter    Rx / DC Orders ED Discharge Orders    None       Renata Caprice 02/12/20 1940    Duffy Bruce, MD 02/13/20 1510

## 2020-02-13 ENCOUNTER — Telehealth: Payer: Self-pay

## 2020-02-13 NOTE — Telephone Encounter (Signed)
[facility] Night - Client TELEPHONE ADVICE RECORD [facility] Patient Name: Larry Goodwin Gender: Male DOB: 19-Nov-1976 Age: 44 Y 12 M 4 D Return Phone Number: [phone] (Primary) Address: City/State/Zip: [place] [address] Client [facility] Night - Client Client Site [place] Physician Ria Bush - MD Contact Type Call Who Is Calling Patient / Member / Family / Caregiver Call Type Triage / Clinical Relationship To Patient Self Return Phone Number [phone] (Primary) Chief Complaint Foot or Ankle Injury Reason for Call Symptomatic / Request for Larry Goodwin states he hurt his right Achilles tendon a few days ago and it is hurting and would like advice. Translation No Nurse Assessment Nurse: Waymon Budge, RN, Vaughan Basta Date/Time (Eastern Time): 02/12/2020 6:33:02 PM Confirm and document reason for call. If symptomatic, describe symptoms. ---Caller states he hurt his right Achilles tendon a few days ago and it is hurting. Reports pain at the back of his ankle. Pain worse yesterday. Reports some swelling. He is able to walk. Has the patient had close contact with a person known or suspected to have the novel coronavirus illness OR traveled / lives in area with major community spread (including international travel) in the last 14 days from the onset of symptoms? * If Asymptomatic, screen for exposure and travel within the last 14 days. ---No Does the patient have any new or worsening symptoms? ---Yes Will a triage be completed? ---Yes Related visit to physician within the last 2 weeks? ---No Does the PT have any chronic conditions? (i.e. diabetes, asthma, this includes High risk factors for pregnancy, etc.) ---No Is this a behavioral health or substance abuse call? ---No Guidelines Guideline Title Affirmed Question Affirmed Notes Nurse Date/Time  Eilene Ghazi Time) Ankle and Foot Injury [1] Limp when walking AND [2] due to a twisted ankle or foot Waymon Budge, RN, Vaughan Basta 02/12/2020 6:35:00 PM Disp. Time Eilene Ghazi Time) Disposition Final User 02/12/2020 6:22:01 PM Attempt made - message left Gomez Cleverly 02/12/2020 6:37:28 PM See PCP within 24 Hours Yes Waymon Budge, RN, Vaughan Basta PLEASE NOTE: All timestamps contained within this report are represented as [place] Standard Time. CONFIDENTIALTY NOTICE: This fax transmission is intended only for the addressee. It contains information that is legally privileged, confidential or otherwise protected from use or disclosure. If you are not the intended recipient, you are strictly prohibited from reviewing, disclosing, copying using or disseminating any of this information or taking any action in reliance on or regarding this information. If you have received this fax in error, please notify us immediately by telephone so that we can arrange for its return to [place]. Phone: [phone], Toll-Free: [phone], Fax: [phone] Page: 2 of 2 Call Id: [id number] [place] Disagree/Comply Comply Caller Understands Yes PreDisposition Go to ED Care Advice Given Per Guideline SEE PCP WITHIN 24 HOURS: * IF OFFICE WILL BE OPEN: You need to be seen within the next 24 hours. Call your doctor (or NP/PA) when the office opens and make an appointment. CALL BACK IF: * You become worse. CARE ADVICE given per Foot and Ankle Injury (Adult) guideline. Comments User: Roosevelt Locks, RN Date/Time Eilene Ghazi Time): 02/12/2020 6:38:24 PM After triage, caller states he is at the ER now. Referrals REFERRED TO PCP OFFICE

## 2020-02-13 NOTE — Telephone Encounter (Signed)
Can we review this team health protocol? Why was pt sent to ER? It seems outcome should have been [facility] or PCP in office next day, don't think it needed ER eval.

## 2020-02-13 NOTE — Telephone Encounter (Signed)
Per chart review tab pt was seen [facility] ED on 02/12/20.

## 2020-02-14 ENCOUNTER — Encounter: Payer: Self-pay | Admitting: Family Medicine

## 2020-02-14 MED ORDER — SILDENAFIL CITRATE 100 MG PO TABS: 50.0000 mg | ORAL_TABLET | Freq: Every day | ORAL | 3 refills | Status: DC | PRN

## 2020-02-14 NOTE — Telephone Encounter (Signed)
Yes, I will escalate for review with team health.  The triage string is confusing the predisposition states ER but then under referral it states PCP office.  But, clearly, patient did go to ER for eval and treatment.   Will see if call can be pulled and reviewed.   Thanks.

## 2020-02-16 NOTE — Telephone Encounter (Signed)
Our clinical manger Lorenda Peck RN) investigated this concern and he reports the following:   He wasn't sent to the ER.  Access Nurse's disposition was for him to see his PCP within 24 hours.  It appears he went to the ED on his own accord.   Will forward to Dr. Danise Mina with investigative response.   Thanks.

## 2020-03-11 ENCOUNTER — Telehealth: Payer: Self-pay | Admitting: Pain Medicine

## 2020-03-11 NOTE — Telephone Encounter (Signed)
Larry Goodwin at [facility] and he stated that the prescription for March somehow got deleted and they took Aprils prescription and changed it for March so that he would be without medication. They need you to prescribe another prescription for the month of Larry Goodwin.

## 2020-03-11 NOTE — Telephone Encounter (Signed)
[facility] at [facility] called stating they do not have a script on file to refill patient's meds. Please check this and call pharmacy.

## 2020-03-18 ENCOUNTER — Other Ambulatory Visit: Payer: Self-pay | Admitting: Pain Medicine

## 2020-03-18 NOTE — Telephone Encounter (Signed)
Yes. They took April 23rd RX and changed it to March so he would run out. He will need a new prescription for $/23/21

## 2020-03-18 NOTE — Telephone Encounter (Signed)
The one to be filled on 4/23?

## 2020-03-20 ENCOUNTER — Other Ambulatory Visit: Payer: Self-pay | Admitting: Pain Medicine

## 2020-03-20 DIAGNOSIS — G894 Chronic pain syndrome: Secondary | ICD-10-CM

## 2020-03-20 MED ORDER — OXYCODONE HCL 5 MG PO TABS: 5.0000 mg | ORAL_TABLET | Freq: Four times a day (QID) | ORAL | 0 refills | Status: DC | PRN

## 2020-03-20 NOTE — Telephone Encounter (Signed)
Done

## 2020-04-08 ENCOUNTER — Other Ambulatory Visit: Payer: Self-pay | Admitting: Family Medicine

## 2020-04-30 ENCOUNTER — Encounter: Payer: Self-pay | Admitting: Pain Medicine

## 2020-05-01 ENCOUNTER — Telehealth: Payer: Self-pay | Admitting: *Deleted

## 2020-05-01 ENCOUNTER — Ambulatory Visit: Payer: Medicare Other | Attending: Pain Medicine | Admitting: Pain Medicine

## 2020-05-01 ENCOUNTER — Other Ambulatory Visit: Payer: Self-pay

## 2020-05-01 DIAGNOSIS — Z79899 Other long term (current) drug therapy: Secondary | ICD-10-CM | POA: Diagnosis not present

## 2020-05-01 DIAGNOSIS — M25512 Pain in left shoulder: Secondary | ICD-10-CM | POA: Diagnosis not present

## 2020-05-01 DIAGNOSIS — M545 Low back pain: Secondary | ICD-10-CM

## 2020-05-01 DIAGNOSIS — M25511 Pain in right shoulder: Secondary | ICD-10-CM | POA: Diagnosis not present

## 2020-05-01 DIAGNOSIS — G894 Chronic pain syndrome: Secondary | ICD-10-CM

## 2020-05-01 DIAGNOSIS — G8929 Other chronic pain: Secondary | ICD-10-CM | POA: Diagnosis not present

## 2020-05-01 DIAGNOSIS — M549 Dorsalgia, unspecified: Secondary | ICD-10-CM

## 2020-05-01 DIAGNOSIS — M25551 Pain in right hip: Secondary | ICD-10-CM | POA: Diagnosis not present

## 2020-05-01 MED ORDER — OXYCODONE HCL 5 MG PO TABS: 5.0000 mg | ORAL_TABLET | Freq: Four times a day (QID) | ORAL | 0 refills | Status: DC | PRN

## 2020-05-01 NOTE — Progress Notes (Signed)
Patient: Larry Goodwin  Service Category: E/M  Provider: Gaspar Cola, MD  DOB: 1976/09/05  DOS: 05/01/2020  Location: Office  MRN: [id number]  Setting: Ambulatory outpatient  Referring Provider: Ria Bush, MD  Type: Established Patient  Specialty: Interventional Pain Management  PCP: Ria Bush, MD  Location: Remote location  Delivery: TeleHealth     Virtual Encounter - Pain Management PROVIDER NOTE: Information contained herein reflects review and annotations entered in association with encounter. Interpretation of such information and data should be left to medically-trained personnel. Information provided to patient can be located elsewhere in the medical record under "Patient Instructions". Document created using STT-dictation technology, any transcriptional errors that may result from process are unintentional.    Contact & Pharmacy Preferred: [place]: [phone] (home) Mobile: [phone] (mobile) E-mail: [email or web address]  [id number] [place] [address]Phone: [phone]: [phone]  Pre-screening  Mr. MStueveoffered "in-person" vs "virtual" encounter. He indicated preferring virtual for this encounter.   Reason COVID-19*  Social distancing based on CDC and AMA recommendations.   I contacted MNatale Layon 05/01/2020 via telephone.      I clearly identified myself as FGaspar Cola MD. I verified that I was speaking with the correct person using two identifiers (Name: MFREDERICK MARRO and date of birth: 911/06/1976.  Consent I sought verbal advanced consent from MNatale Layfor virtual visit interactions. I informed Mr. MPortillaof possible security and privacy concerns, risks, and limitations associated with providing "not-in-person" medical evaluation and management services. I also informed Mr. MKallalof the availability of "in-person"  appointments. Finally, I informed him that there would be a charge for the virtual visit and that he could be  personally, fully or partially, financially responsible for it. Mr. MAllbrittonexpressed understanding and agreed to proceed.   Historic Elements   Mr. MJAMAURI KRUZELis a 44y.o. year old, male patient evaluated today after his last contact with our practice on 03/11/2020. Mr. MLutze has a past medical history of Bell's palsy (2015), Chronic pain syndrome, DDD (degenerative disc disease), lumbar, HTN (hypertension), and Morbid obesity (H[place]ord (08/16/2014). He also  has a past surgical history that includes Back surgery (2012) and Cholecystectomy. Mr. MParcohas a current medication list which includes the following prescription(s): lisinopril, magnesium oxide, omeprazole, [START ON 05/05/2020] oxycodone, [START ON 06/04/2020] oxycodone, [START ON 07/04/2020] oxycodone, sildenafil, vitamin b-12, and vitamin d3. He  reports that he quit smoking about 12 years ago. He has never used smokeless tobacco. He reports current drug use. Drug: Oxycodone. He reports that he does not drink alcohol. Mr. MHardawayis allergic to levaquin [levofloxacin in d5w].   HPI  Today, he is being contacted for medication management.  The patient indicates doing well with the current medication regimen. No adverse reactions or side effects reported to the medications.   Pharmacotherapy Assessment  Analgesic: Oxycodone IR 5 mg, 1 tab PO q 6 hrs (20 mg/day of oxycodone) MME/day:30 mg/day.   Monitoring: [place] PMP: PDMP reviewed during this encounter.       Pharmacotherapy: No side-effects or adverse reactions reported. Compliance: No problems identified. Effectiveness: Clinically acceptable. Plan: Refer to "POC".  UDS:  Summary  Date Value Ref Range Status  02/06/2019 FINAL  Final    Comment:    ==================================================================== TOXASSURE SELECT 13  (MW) ==================================================================== Test  Result       Flag       Units Drug Present and Declared for Prescription Verification   Oxycodone                      722          EXPECTED   ng/mg creat   Oxymorphone                    576          EXPECTED   ng/mg creat   Noroxycodone                   921          EXPECTED   ng/mg creat   Noroxymorphone                 116          EXPECTED   ng/mg creat    Sources of oxycodone are scheduled prescription medications.    Oxymorphone, noroxycodone, and noroxymorphone are expected    metabolites of oxycodone. Oxymorphone is also available as a    scheduled prescription medication. ==================================================================== Test                      Result    Flag   Units      Ref Range   Creatinine              109              mg/dL      >=20 ==================================================================== Declared Medications:  The flagging and interpretation on this report are based on the  following declared medications.  Unexpected results may arise from  inaccuracies in the declared medications.  **Note: The testing scope of this panel includes these medications:  Oxycodone  **Note: The testing scope of this panel does not include following  reported medications:  Cholecalciferol  Cyanocobalamin  Lisinopril  Magnesium Oxide ==================================================================== For clinical consultation, please call [phone]. ====================================================================    Laboratory Chemistry Profile   Renal Lab Results  Component Value Date   BUN 14 07/19/2019   CREATININE 0.85 07/19/2019   BCR 11 02/06/2019   GFR 97.98 07/19/2019   GFRAA 129 02/06/2019   GFRNONAA 112 02/06/2019     Hepatic Lab Results  Component Value Date   AST 15 07/19/2019   ALT 11 07/19/2019   ALBUMIN 4.3  07/19/2019   ALKPHOS 45 07/19/2019   LIPASE 153 09/21/2012     Electrolytes Lab Results  Component Value Date   NA 141 07/19/2019   K 4.4 07/19/2019   CL 104 07/19/2019   CALCIUM 9.2 07/19/2019   MG 1.9 07/19/2019     Bone Lab Results  Component Value Date   VD25OH 48.99 07/19/2019   25OHVITD1 35 02/06/2019   25OHVITD2 <1.0 02/06/2019   25OHVITD3 34 02/06/2019     Inflammation (CRP: Acute Phase) (ESR: Chronic Phase) Lab Results  Component Value Date   CRP 9 02/06/2019   ESRSEDRATE 6 02/06/2019       Note: Above Lab results reviewed.  Imaging  DG Foot Complete Right CLINICAL DATA:  Jarring foot 3 days prior, no fall  EXAM: RIGHT FOOT COMPLETE - 3+ VIEW  COMPARISON:  Radiograph 03/24/2012  FINDINGS: No acute bony abnormality. Specifically, no fracture, subluxation, or dislocation. Mild degenerative changes are noted in the midfoot. Posterior calcaneal spur is present. Trace  ankle effusion. Soft tissues are otherwise unremarkable  IMPRESSION: No acute fracture or traumatic malalignment.  Mild degenerative changes in the midfoot.  Posterior calcaneal spur.  Trace ankle effusion.  Electronically Signed   By: Lovena Le M.D.   On: 02/12/2020 19:03  Assessment  The primary encounter diagnosis was Chronic pain syndrome. Diagnoses of Chronic low back pain (Primary Area of Pain) (Bilateral) (R>L), Chronic hip pain (Secondary area of Pain) (Right), Chronic upper back pain (Third area of Pain), Chronic shoulder pain (Fourth area of Pain) (Bilateral) (Right), and Pharmacologic therapy were also pertinent to this visit.  Plan of Care  Problem-specific:  No problem-specific Assessment & Plan notes found for this encounter.  Mr. BRODEE MAURITZ has a current medication list which includes the following long-term medication(s): lisinopril, magnesium oxide, omeprazole, [START ON 05/05/2020] oxycodone, [START ON 06/04/2020] oxycodone, [START ON 07/04/2020] oxycodone,  sildenafil, and vitamin d3.  Pharmacotherapy (Medications Ordered): Meds ordered this encounter  Medications  . oxyCODONE (OXY IR/ROXICODONE) 5 MG immediate release tablet    Sig: Take 1 tablet (5 mg total) by mouth every 6 (six) hours as needed for severe pain. Must last 30 days    Dispense:  120 tablet    Refill:  0    Chronic Pain: STOP Act (Not applicable) Fill 1 day early if closed on refill date. Do not fill until: 05/05/2020. To last until: 06/04/2020. Avoid benzodiazepines within 8 hours of opioids  . oxyCODONE (OXY IR/ROXICODONE) 5 MG immediate release tablet    Sig: Take 1 tablet (5 mg total) by mouth every 6 (six) hours as needed for severe pain. Must last 30 days    Dispense:  120 tablet    Refill:  0    Chronic Pain: STOP Act (Not applicable) Fill 1 day early if closed on refill date. Do not fill until: 06/04/2020. To last until: 07/04/2020. Avoid benzodiazepines within 8 hours of opioids  . oxyCODONE (OXY IR/ROXICODONE) 5 MG immediate release tablet    Sig: Take 1 tablet (5 mg total) by mouth every 6 (six) hours as needed for severe pain. Must last 30 days    Dispense:  120 tablet    Refill:  0    Chronic Pain: STOP Act (Not applicable) Fill 1 day early if closed on refill date. Do not fill until: 07/04/2020. To last until: 08/03/2020. Avoid benzodiazepines within 8 hours of opioids   Orders:  Orders Placed This Encounter  Procedures  . ToxASSURE Select 13 (MW), Urine    Volume: 30 ml(s). Minimum 3 ml of urine is needed. Document temperature of fresh sample. Indications: Long term (current) use of opiate analgesic (E42.353)    Order Specific Question:   Release to patient    Answer:   Immediate   Follow-up plan:   Return in about 13 weeks (around 07/31/2020) for (F2F), (MM).      Interventional treatment options: Planned, scheduled, and/or pending:   Not at this time.   Under consideration:   Diagnostic right CESI  Diagnostic thoracic ESI  Diagnostic bilateral thoracic  facet block  Possible bilateral thoracic facet RFA  Diagnostic bilateral lumbar facet block  Possible bilateral lumbar facet RFA  Diagnostic right sacroiliac joint block  Possible right sacroiliac joint RFA  Diagnostic caudal ESI and epidurogram  Diagnostic right-sided L4-5 TFESI  Diagnostic right IA knee injection  Diagnostic right genicular NB  Possible right genicular RFA    Therapeutic/palliative (PRN):   None at this time  Recent Visits Date Type Provider Dept  02/05/20 Telemedicine Milinda Pointer, MD [facility]  Showing recent visits within past 90 days and meeting all other requirements   Today's Visits Date Type Provider Dept  05/01/20 Telemedicine Milinda Pointer, MD [facility]  Showing today's visits and meeting all other requirements   Future Appointments No visits were found meeting these conditions.  Showing future appointments within next 90 days and meeting all other requirements   I discussed the assessment and treatment plan with the patient. The patient was provided an opportunity to ask questions and all were answered. The patient agreed with the plan and demonstrated an understanding of the instructions.  Patient advised to call back or seek an in-person evaluation if the symptoms or condition worsens.  Duration of encounter: 11 minutes.  Note by: Gaspar Cola, MD Date: 05/01/2020; Time: 9:18 AM

## 2020-05-02 DIAGNOSIS — Z79899 Other long term (current) drug therapy: Secondary | ICD-10-CM | POA: Diagnosis not present

## 2020-05-02 DIAGNOSIS — G894 Chronic pain syndrome: Secondary | ICD-10-CM | POA: Diagnosis not present

## 2020-05-06 LAB — TOXASSURE SELECT 13 (MW), URINE

## 2020-07-31 ENCOUNTER — Ambulatory Visit: Payer: Medicare Other | Admitting: Pain Medicine

## 2020-07-31 ENCOUNTER — Other Ambulatory Visit: Payer: Self-pay

## 2020-07-31 ENCOUNTER — Ambulatory Visit
Payer: Medicare Other | Attending: Pain Medicine | Admitting: Student in an Organized Health Care Education/Training Program

## 2020-07-31 ENCOUNTER — Encounter: Payer: Self-pay | Admitting: Student in an Organized Health Care Education/Training Program

## 2020-07-31 VITALS — Ht 74.0 in | Wt 250.0 lb

## 2020-07-31 DIAGNOSIS — M25551 Pain in right hip: Secondary | ICD-10-CM | POA: Diagnosis not present

## 2020-07-31 DIAGNOSIS — M533 Sacrococcygeal disorders, not elsewhere classified: Secondary | ICD-10-CM | POA: Diagnosis not present

## 2020-07-31 DIAGNOSIS — M549 Dorsalgia, unspecified: Secondary | ICD-10-CM | POA: Insufficient documentation

## 2020-07-31 DIAGNOSIS — G8929 Other chronic pain: Secondary | ICD-10-CM | POA: Diagnosis not present

## 2020-07-31 DIAGNOSIS — M545 Low back pain, unspecified: Secondary | ICD-10-CM

## 2020-07-31 DIAGNOSIS — G894 Chronic pain syndrome: Secondary | ICD-10-CM | POA: Insufficient documentation

## 2020-07-31 DIAGNOSIS — M4726 Other spondylosis with radiculopathy, lumbar region: Secondary | ICD-10-CM | POA: Insufficient documentation

## 2020-07-31 MED ORDER — OXYCODONE HCL 5 MG PO TABS: 5.0000 mg | ORAL_TABLET | Freq: Four times a day (QID) | ORAL | 0 refills | Status: DC | PRN

## 2020-07-31 NOTE — Progress Notes (Signed)
PROVIDER NOTE: Information contained herein reflects review and annotations entered in association with encounter. Interpretation of such information and data should be left to medically-trained personnel. Information provided to patient can be located elsewhere in the medical record under "Patient Instructions". Document created using STT-dictation technology, any transcriptional errors that may result from process are unintentional.    Patient: Larry Goodwin  Service Category: E/M  Provider: Gillis Santa, MD  DOB: 07/06/1976  DOS: 07/31/2020  Specialty: Interventional Pain Management  MRN: [id number]  Setting: Ambulatory outpatient  PCP: Ria Bush, MD  Type: Established Patient    Referring Provider: Ria Bush, MD  Location: Office  Delivery: Face-to-face     HPI  Reason for encounter: Mr. HAVISH PETTIES, a 44 y.o. year old male, is here today for evaluation and management of his Chronic bilateral low back pain without sciatica [M54.5, G89.29]. Mr. Schwartzman's primary complain today is Back Pain (mid and low) Last encounter: Practice (05/01/2020). My last encounter with him was on Visit date not found. Pertinent problems: Mr. Mohabir has Lumbar DDD (degenerative disc disease); Chronic pain syndrome; Lumbar facet syndrome (Bilateral) (R>L); Chronic upper back pain (Third area of Pain); Lumbar spondylosis with radiculopathy; Epidural fibrosis; Failed back surgical syndrome; Long term prescription opiate use; Long term current use of opiate analgesic; Opiate use (30 MME/Day); Chronic low back pain (Primary Area of Pain) (Bilateral) (R>L); Chronic sacroiliac joint pain (Right); Cervical IVDD (intervertebral disc displacement); Cervical foraminal stenosis (C5-6 and C7-T1: Right); Cervical central spinal stenosis (C5-6); Thoracic IVDD (intervertebral disc displacement); Thoracic facet arthropathy; Thoracic facet syndrome ([place]); Lumbar IVDD (intervertebral disc displacement); Lumbar foraminal  stenosis (Right L4-5); and Lumbar central spinal stenosis (L3-4 and L4-5) on their pertinent problem list. Pain Assessment: Severity of Chronic pain is reported as a 3 /10. Location: Back Mid/right upper buttock. Onset: More than a month ago. Quality: Burning, Aching, Constant. Timing: Constant. Modifying factor(s): rest, heat, medications. Vitals:  height is _0  (1.88 m) and weight is 250 lb (113.4 kg).   No change in medical history since last visit.  Patient's pain is at baseline.  Patient continues multimodal pain regimen as prescribed.  States that it provides pain relief and improvement in functional status.   Pharmacotherapy Assessment   07/05/2020  1   05/01/2020  Oxycodone Hcl 5 MG Tablet  120.00  30 Fr Nav   [id number]   Nor ([id number])   0/0  30.00 MME  Private Pay   [place]      Monitoring: [place] PMP: PDMP reviewed during this encounter.       Pharmacotherapy: No side-effects or adverse reactions reported. Compliance: No problems identified. Effectiveness: Clinically acceptable.  [name], RN  07/31/2020  1:33 PM  Sign when Signing Visit Nursing Pain Medication Assessment:  Safety precautions to be maintained throughout the outpatient stay will include: orient to surroundings, keep bed in low position, maintain call bell within reach at all times, provide assistance with transfer out of bed and ambulation.  Medication Inspection Compliance: Pill count conducted under aseptic conditions, in front of the patient. Neither the pills nor the bottle was removed from the patient's sight at any time. Once count was completed pills were immediately returned to the patient in their original bottle.  Medication: Oxycodone IR Pill/Patch Count: 15 of 120 pills remain Pill/Patch Appearance: Markings consistent with prescribed medication Bottle Appearance: Standard pharmacy container. Clearly labeled. Filled Date: 07 / 23 / 2021 Last Medication intake:  Today    UDS:  Summary  Date Value  Ref Range Status  05/02/2020 Note  Final    Comment:    ==================================================================== [employer] (MW) ==================================================================== Test                             Result       Flag       Units Drug Present and Declared for Prescription Verification   Oxycodone                      1045         EXPECTED   ng/mg creat   Oxymorphone                    508          EXPECTED   ng/mg creat   Noroxycodone                   1029         EXPECTED   ng/mg creat   Noroxymorphone                 176          EXPECTED   ng/mg creat    Sources of oxycodone are scheduled prescription medications.    Oxymorphone, noroxycodone, and noroxymorphone are expected    metabolites of oxycodone. Oxymorphone is also available as a    scheduled prescription medication. ==================================================================== Test                      Result    Flag   Units      Ref Range   Creatinine              38               mg/dL      >=20 ==================================================================== Declared Medications:  The flagging and interpretation on this report are based on the  following declared medications.  Unexpected results may arise from  inaccuracies in the declared medications.  **Note: The testing scope of this panel includes these medications:  Oxycodone  **Note: The testing scope of this panel does not include the  following reported medications:  Cholecalciferol  Cyclobenzaprine  Lisinopril  Magnesium (Mag-Ox)  Omeprazole  Sildenafil ==================================================================== For clinical consultation, please call [phone]. ====================================================================      ROS  Constitutional: Denies any fever or chills Gastrointestinal: No reported hemesis, hematochezia, vomiting, or acute GI  distress Musculoskeletal: + low back pain Neurological: No reported episodes of acute onset apraxia, aphasia, dysarthria, agnosia, amnesia, paralysis, loss of coordination, or loss of consciousness  Medication Review  Magnesium Oxide, Vitamin D3, lisinopril, omeprazole, oxyCODONE, sildenafil, and vitamin B-12  History Review  Allergy: Mr. Martenson is allergic to levaquin [levofloxacin in d5w]. Drug: Mr. Furukawa  reports current drug use. Drug: Oxycodone. Alcohol:  reports no history of alcohol use. Tobacco:  reports that he quit smoking about 12 years ago. He has never used smokeless tobacco. Social: Mr. Effinger  reports that he quit smoking about 12 years ago. He has never used smokeless tobacco. He reports current drug use. Drug: Oxycodone. He reports that he does not drink alcohol. Medical:  has a past medical history of Bell's palsy (2015), Chronic pain syndrome, DDD (degenerative disc disease), lumbar, HTN (hypertension), and Morbid obesity ([place]) (08/16/2014). Surgical: Mr. Rhett  has a past surgical history that includes  Back surgery (2012) and Cholecystectomy. Family: family history includes COPD in his mother; Hypertension in an other family member.  Laboratory Chemistry Profile   Renal Lab Results  Component Value Date   BUN 14 07/19/2019   CREATININE 0.85 07/19/2019   BCR 11 02/06/2019   GFR 97.98 07/19/2019   GFRAA 129 02/06/2019   GFRNONAA 112 02/06/2019     Hepatic Lab Results  Component Value Date   AST 15 07/19/2019   ALT 11 07/19/2019   ALBUMIN 4.3 07/19/2019   ALKPHOS 45 07/19/2019   LIPASE 153 09/21/2012     Electrolytes Lab Results  Component Value Date   NA 141 07/19/2019   K 4.4 07/19/2019   CL 104 07/19/2019   CALCIUM 9.2 07/19/2019   MG 1.9 07/19/2019     Bone Lab Results  Component Value Date   VD25OH 48.99 07/19/2019   25OHVITD1 35 02/06/2019   25OHVITD2 <1.0 02/06/2019   25OHVITD3 34 02/06/2019     Inflammation (CRP: Acute Phase) (ESR:  Chronic Phase) Lab Results  Component Value Date   CRP 9 02/06/2019   ESRSEDRATE 6 02/06/2019       Note: Above Lab results reviewed.  Recent Imaging Review  DG Foot Complete Right CLINICAL DATA:  Jarring foot 3 days prior, no fall  EXAM: RIGHT FOOT COMPLETE - 3+ VIEW  COMPARISON:  Radiograph 03/24/2012  FINDINGS: No acute bony abnormality. Specifically, no fracture, subluxation, or dislocation. Mild degenerative changes are noted in the midfoot. Posterior calcaneal spur is present. Trace ankle effusion. Soft tissues are otherwise unremarkable  IMPRESSION: No acute fracture or traumatic malalignment.  Mild degenerative changes in the midfoot.  Posterior calcaneal spur.  Trace ankle effusion.  Electronically Signed   By: Lovena Le M.D.   On: 02/12/2020 19:03 Note: Reviewed        Physical Exam  General appearance: Well nourished, well developed, and well hydrated. In no apparent acute distress Mental status: Alert, oriented x 3 (person, place, & time)       Respiratory: No evidence of acute respiratory distress Eyes: PERLA Vitals: Ht _0  (1.88 m)   Wt 250 lb (113.4 kg)   BMI 32.10 kg/m  BMI: Estimated body mass index is 32.1 kg/m as calculated from the following:   Height as of this encounter: _1  (1.88 m).   Weight as of this encounter: 250 lb (113.4 kg). Ideal: Ideal body weight: 82.2 kg (181 lb 3.5 oz) Adjusted ideal body weight: 94.7 kg (208 lb 11.7 oz)  Lumbar Spine Area Exam  Skin & Axial Inspection: Well healed scar from previous spine surgery detected Alignment: Symmetrical Functional ROM: Pain restricted ROM affecting both sides Stability: No instability detected Muscle Tone/Strength: Functionally intact. No obvious neuro-muscular anomalies detected. Sensory (Neurological): Musculoskeletal pain pattern and dermatomal  Gait & Posture Assessment  Ambulation: Unassisted Gait: Relatively normal for age and body habitus Posture: WNL  Lower  Extremity Exam    Side: Right lower extremity  Side: Left lower extremity  Stability: No instability observed          Stability: No instability observed          Skin & Extremity Inspection: Skin color, temperature, and hair growth are WNL. No peripheral edema or cyanosis. No masses, redness, swelling, asymmetry, or associated skin lesions. No contractures.  Skin & Extremity Inspection: Skin color, temperature, and hair growth are WNL. No peripheral edema or cyanosis. No masses, redness, swelling, asymmetry, or associated skin lesions. No contractures.  Functional ROM: Unrestricted ROM                  Functional ROM: Unrestricted ROM                  Muscle Tone/Strength: Functionally intact. No obvious neuro-muscular anomalies detected.  Muscle Tone/Strength: Functionally intact. No obvious neuro-muscular anomalies detected.  Sensory (Neurological): Unimpaired        Sensory (Neurological): Unimpaired        DTR: Patellar: deferred today Achilles: deferred today Plantar: deferred today  DTR: Patellar: deferred today Achilles: deferred today Plantar: deferred today  Palpation: No palpable anomalies  Palpation: No palpable anomalies    Assessment   Status Diagnosis  Controlled Controlled Controlled 1. Chronic low back pain (Primary Area of Pain) (Bilateral) (R>L)   2. Chronic pain syndrome   3. Chronic hip pain (Secondary area of Pain) (Right)   4. Chronic upper back pain (Third area of Pain)   5. Lumbar spondylosis with radiculopathy   6. Chronic sacroiliac joint pain (Right)      Updated Problems: Problem  Cervical IVDD (intervertebral disc displacement)   C3-4: Mild disc bulge. C5-6: Broad-based disc bulge with bilateral laterality sedation. C6-7: Broad-based disc bulge.   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 T7-8: Right posterolateral  disc herniation towards the right side. T8-9: Central right posterolateral disc herniation T9-10: Central disc herniation with caudal migration to the left. T10-11: Mild bulging disc. T11-12: Mild bulging disc.   Thoracic facet arthropathy  Thoracic facet syndrome (H[facility])  Lumbar IVDD (intervertebral disc displacement)   L1-2: Broad-based disc bulge L3-4: Broad-based disc bulge. L4-5: Broad-based disc bulge with lateral release sedation to the right.   Lumbar foraminal stenosis (Right L4-5)  Lumbar central spinal stenosis (L3-4 and L4-5)  Lumbar facet syndrome (Bilateral) (R>L)  Chronic upper back pain (Third area of Pain)  Lumbar spondylosis with radiculopathy  Epidural Fibrosis  Failed Back Surgical Syndrome  Long Term Prescription Opiate Use  Long Term Current Use of Opiate Analgesic  Opiate use (30 MME/Day)  Chronic low back pain (Primary Area of Pain) (Bilateral) (R>L)  Chronic sacroiliac joint pain (Right)  Chronic Pain Syndrome   s/p 2 back surgeries [facility]   Lumbar DDD (degenerative disc disease)   severe s/p 2 surgeries     Plan of Care  Mr. CLOYDE OREGEL has a current medication list which includes the following long-term medication(s): vitamin d3, lisinopril, magnesium oxide, [START ON 08/04/2020] oxycodone, [START ON 09/03/2020] oxycodone, [START ON 10/03/2020] oxycodone, sildenafil, and omeprazole.  Pharmacotherapy (Medications Ordered): Meds ordered this encounter  Medications  . oxyCODONE (OXY IR/ROXICODONE) 5 MG immediate release tablet    Sig: Take 1 tablet (5 mg total) by mouth every 6 (six) hours as needed for severe pain. Must last 30 days    Dispense:  120 tablet    Refill:  0    Chronic Pain: STOP Act (Not applicable) Fill 1 day early if closed on refill date.  Marland Kitchen oxyCODONE (OXY IR/ROXICODONE) 5 MG immediate release tablet    Sig: Take 1 tablet (5 mg total) by mouth every 6 (six) hours as needed for severe pain. Must last 30 days     Dispense:  120 tablet    Refill:  0    Chronic Pain: STOP Act (Not applicable) Fill 1 day early if closed on refill  date.  . oxyCODONE (OXY IR/ROXICODONE) 5 MG immediate release tablet    Sig: Take 1 tablet (5 mg total) by mouth every 6 (six) hours as needed for severe pain. Must last 30 days    Dispense:  120 tablet    Refill:  0    Chronic Pain: STOP Act (Not applicable) Fill 1 day early if closed on refill date.    Follow-up plan:   Return in about 3 months (around 10/31/2020) for Medication Management, in person.   Recent Visits No visits were found meeting these conditions. Showing recent visits within past 90 days and meeting all other requirements Today's Visits Date Type Provider Dept  07/31/20 Office Visit Gillis Santa, MD [facility]  Showing today's visits and meeting all other requirements Future Appointments Date Type Provider Dept  10/28/20 Appointment Milinda Pointer, MD [facility]  Showing future appointments within next 90 days and meeting all other requirements  I discussed the assessment and treatment plan with the patient. The patient was provided an opportunity to ask questions and all were answered. The patient agreed with the plan and demonstrated an understanding of the instructions.  Patient advised to call back or seek an in-person evaluation if the symptoms or condition worsens.  Duration of encounter: 30 minutes.  Note by: Gillis Santa, MD Date: 07/31/2020; Time: 1:51 PM

## 2020-07-31 NOTE — Progress Notes (Signed)
Nursing Pain Medication Assessment:  Safety precautions to be maintained throughout the outpatient stay will include: orient to surroundings, keep bed in low position, maintain call bell within reach at all times, provide assistance with transfer out of bed and ambulation.  Medication Inspection Compliance: Pill count conducted under aseptic conditions, in front of the patient. Neither the pills nor the bottle was removed from the patient's sight at any time. Once count was completed pills were immediately returned to the patient in their original bottle.  Medication: Oxycodone IR Pill/Patch Count: 15 of 120 pills remain Pill/Patch Appearance: Markings consistent with prescribed medication Bottle Appearance: Standard pharmacy container. Clearly labeled. Filled Date: 07 / 23 / 2021 Last Medication intake:  Today

## 2020-10-02 ENCOUNTER — Other Ambulatory Visit: Payer: Self-pay | Admitting: Family Medicine

## 2020-10-02 NOTE — Telephone Encounter (Signed)
E-scribed refill.  Plz schedule wellness, cpe and lab visits.  

## 2020-10-08 NOTE — Telephone Encounter (Signed)
Patient is scheduled EM 

## 2020-10-25 ENCOUNTER — Telehealth (INDEPENDENT_AMBULATORY_CARE_PROVIDER_SITE_OTHER): Payer: Medicare Other | Admitting: Family Medicine

## 2020-10-25 ENCOUNTER — Encounter: Payer: Self-pay | Admitting: Family Medicine

## 2020-10-25 DIAGNOSIS — J019 Acute sinusitis, unspecified: Secondary | ICD-10-CM

## 2020-10-25 MED ORDER — AMOXICILLIN-POT CLAVULANATE 875-125 MG PO TABS: 1.0000 | ORAL_TABLET | Freq: Two times a day (BID) | ORAL | 0 refills | Status: AC

## 2020-10-25 NOTE — Assessment & Plan Note (Addendum)
Given duration and progression of symptoms, will treat for bacterial sinusitis with augmentin course. Discussed stopping abx after 7 days if symptoms resolved otherwise may take full 10 days. Discussed probiotic after antibiotic.  Discussed other supportive care with OTC remedies.

## 2020-10-25 NOTE — Progress Notes (Signed)
Patient ID: Larry Goodwin, male    DOB: [birth year], 44 y.o.   MRN: [id number]  Virtual visit completed through [place], a video enabled telemedicine application. Due to national recommendations of social distancing due to COVID-19, a virtual visit is felt to be most appropriate for this patient at this time. Reviewed limitations, risks, security and privacy concerns of performing a virtual visit and the availability of in person appointments. I also reviewed that there may be a patient responsible charge related to this service. The patient agreed to proceed.   Patient location: home Provider location: [place] at [facility], office Persons participating in this virtual visit: patient, provider   If any vitals were documented, they were collected by patient at home unless specified below.    BP 140/90   Temp 97.7 F (36.5 C)   Ht 6\' 2"  (1.88 m)   BMI 32.10 kg/m    CC: sinus congestion Subjective:   HPI: Larry Goodwin is a 44 y.o. male presenting on 10/25/2020 for Sinus Problem (C/o sinus pressure, and congestion, popping in bilateral ears, HA in top/back of head and dizziness.  Started about 2 wks ago.  )   2 wk h/o facial sinus pressure as well as at vertex of head, congestion, ear popping, headache and dizziness. Green colored sputum. Head > chest congestion.  Treating with hot shower steam baths with some benefit,   No fevers/chills, cough, ear pain, tooth pain, ST, PNdrainage.    01/2020 - sinusitis treated with augmentin course 09/2019 - sinus congestion treated with flonase and mucinex with improvement 07/2019 - sinusitis treated with augmentin course - never fully improved, did not take doxy course prescribed.  03/2019 - sinusitis treated with augmentin course 11/2018 - sinusitis treated with augmentin course 08/2018 - sinusitis treated with augmentin course And more over the years.  Does fully improve after abx course.   Referred toENT8/2020 - has not been  able to see due to outstanding bill.     Relevant past medical, surgical, family and social history reviewed and updated as indicated. Interim medical history since our last visit reviewed. Allergies and medications reviewed and updated. Outpatient Medications Prior to Visit  Medication Sig Dispense Refill  . Cholecalciferol (VITAMIN D3) 50 MCG (2000 UT) capsule Take 2 capsules (4,000 Units total) by mouth daily.    Marland Kitchen lisinopril (ZESTRIL) 40 MG tablet TAKE 1 TABLET BY MOUTH EVERY DAY 90 tablet 0  . Magnesium Oxide 500 MG CAPS Take 1 capsule (500 mg total) by mouth daily. 90 capsule 3  . omeprazole (PRILOSEC) 40 MG capsule Take 1 capsule (40 mg total) by mouth daily. Daily for 3 weeks then as needed 30 capsule 1  . oxyCODONE (OXY IR/ROXICODONE) 5 MG immediate release tablet Take 1 tablet (5 mg total) by mouth every 6 (six) hours as needed for severe pain. Must last 30 days 120 tablet 0  . sildenafil (VIAGRA) 100 MG tablet Take 0.5-1 tablets (50-100 mg total) by mouth daily as needed for erectile dysfunction. 5 tablet 3  . vitamin B-12 (CYANOCOBALAMIN) 1000 MCG tablet Take 1,000 mcg by mouth daily.    Marland Kitchen oxyCODONE (OXY IR/ROXICODONE) 5 MG immediate release tablet Take 1 tablet (5 mg total) by mouth every 6 (six) hours as needed for severe pain. Must last 30 days 120 tablet 0  . oxyCODONE (OXY IR/ROXICODONE) 5 MG immediate release tablet Take 1 tablet (5 mg total) by mouth every 6 (six) hours as needed for severe pain. Must  last 30 days 120 tablet 0   No facility-administered medications prior to visit.    Per HPI unless specifically indicated in ROS section below Review of Systems Objective:  BP 140/90   Temp 97.7 F (36.5 C)   Ht 6\' 2"  (1.88 m)   BMI 32.10 kg/m   Wt Readings from Last 3 Encounters:  07/31/20 250 lb (113.4 kg)  02/12/20 260 lb (117.9 kg)  07/17/19 260 lb (117.9 kg)       Physical exam: Gen: alert, NAD, not ill appearing Pulm: speaks in complete sentences without  increased work of breathing Psych: normal mood, normal thought content      Assessment & Plan:   Problem List Items Addressed This Visit    Acute sinusitis    Given duration and progression of symptoms, will treat for bacterial sinusitis with augmentin course. Discussed stopping abx after 7 days if symptoms resolved otherwise may take full 10 days. Discussed probiotic after antibiotic.  Discussed other supportive care with OTC remedies.       Relevant Medications   amoxicillin-clavulanate (AUGMENTIN) 875-125 MG tablet       Meds ordered this encounter  Medications  . amoxicillin-clavulanate (AUGMENTIN) 875-125 MG tablet    Sig: Take 1 tablet by mouth 2 (two) times daily for 10 days.    Dispense:  20 tablet    Refill:  0   No orders of the defined types were placed in this encounter.   I discussed the assessment and treatment plan with the patient. The patient was provided an opportunity to ask questions and all were answered. The patient agreed with the plan and demonstrated an understanding of the instructions. The patient was advised to call back or seek an in-person evaluation if the symptoms worsen or if the condition fails to improve as anticipated.  Follow up plan: No follow-ups on file.  Ria Bush, MD

## 2020-10-27 NOTE — Progress Notes (Signed)
PROVIDER NOTE: Information contained herein reflects review and annotations entered in association with encounter. Interpretation of such information and data should be left to medically-trained personnel. Information provided to patient can be located elsewhere in the medical record under "Patient Instructions". Document created using STT-dictation technology, any transcriptional errors that may result from process are unintentional.    Patient: Larry Goodwin  Service Category: E/M  Provider: Gaspar Cola, MD  DOB: 24-Apr-1976  DOS: 10/28/2020  Specialty: Interventional Pain Management  MRN: [id number]  Setting: Ambulatory outpatient  PCP: Ria Bush, MD  Type: Established Patient    Referring Provider: Ria Bush, MD  Location: Office  Delivery: Face-to-face     HPI  Mr. YORDAN MARTINDALE, a 44 y.o. year old male, is here today because of his Chronic pain syndrome [G89.4]. Mr. Horne's primary complain today is Back Pain Last encounter: My last encounter with him was on Visit date not found. Pertinent problems: Mr. 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 Pain Assessment: Severity of Chronic pain is reported as a 4 /10. Location: Back Left, Right, Lower/right buttock at times. Onset: More than a month ago. Quality: Constant, Pounding, Throbbing, Shooting. Timing: Constant. Modifying factor(s): meds, Goodwin down, hot shower. Vitals:  height is 6' 2" (1.88 m) and weight is 250 lb (113.4 kg). His temperature is 97.7 F (36.5 C). His blood pressure is 135/93 (abnormal) and his pulse is 79. His oxygen saturation is 99%.   Reason for encounter: medication management.  The patient indicates doing well with the current medication regimen. No adverse reactions or side effects reported to the medications.   RTCB: 01/31/2021 Nonopioids transferred 10/28/2020: Magnesium  Pharmacotherapy Assessment   Analgesic: Oxycodone IR 5 mg, 1 tab PO q 6 hrs (20 mg/day of oxycodone) MME/day:30 mg/day.   Monitoring: [place] PMP: PDMP reviewed during this encounter.       Pharmacotherapy: No side-effects or adverse reactions reported. Compliance: No problems identified. Effectiveness: Clinically acceptable.  Chauncey Fischer, RN  10/28/2020 12:15 PM  Sign when Signing Visit Nursing Pain Medication Assessment:  Safety precautions to be maintained throughout the outpatient stay will include: orient to surroundings, keep bed in low position, maintain call bell within reach at all times, provide assistance with transfer out of bed and ambulation.  Medication Inspection Compliance: Pill count conducted under aseptic conditions, in front of the patient. Neither the pills nor the bottle was removed from the patient's sight at any time. Once count was completed pills were immediately returned to the patient  in their original bottle.  Medication: Oxycodone IR Pill/Patch Count: 15 of 120 pills remain Pill/Patch Appearance: Markings  consistent with prescribed medication Bottle Appearance: Standard pharmacy container. Clearly labeled. Filled Date: 10 / 20 / 21 Last Medication intake:  TodaySafety precautions to be maintained throughout the outpatient stay will include: orient to surroundings, keep bed in low position, maintain call bell within reach at all times, provide assistance with transfer out of bed and ambulation.     UDS:  Summary  Date Value Ref Range Status  05/02/2020 Note  Final    Comment:    ==================================================================== [employer] (MW) ==================================================================== Test                             Result       Flag       Units Drug Present and Declared for Prescription Verification   Oxycodone                      1045         EXPECTED   ng/mg creat   Oxymorphone                    508          EXPECTED   ng/mg creat   Noroxycodone                   1029         EXPECTED   ng/mg creat   Noroxymorphone                 176          EXPECTED   ng/mg creat    Sources of oxycodone are scheduled prescription medications.    Oxymorphone, noroxycodone, and noroxymorphone are expected    metabolites of oxycodone. Oxymorphone is also available as a    scheduled prescription medication. ==================================================================== Test                      Result    Flag   Units      Ref Range   Creatinine              38               mg/dL      >=20 ==================================================================== Declared Medications:  The flagging and interpretation on this report are based on the  following declared medications.  Unexpected results may arise from  inaccuracies in the declared medications.  **Note: The testing scope of this panel includes these  medications:  Oxycodone  **Note: The testing scope of this panel does not include the  following reported medications:  Cholecalciferol  Cyclobenzaprine  Lisinopril  Magnesium (Mag-Ox)  Omeprazole  Sildenafil ==================================================================== For clinical consultation, please call [phone]. ====================================================================      ROS  Constitutional: Denies any fever or chills Gastrointestinal: No reported hemesis, hematochezia, vomiting, or acute GI distress Musculoskeletal: Denies any acute onset joint swelling, redness, loss of ROM, or weakness Neurological: No reported episodes of acute onset apraxia, aphasia, dysarthria, agnosia, amnesia, paralysis, loss of coordination, or loss of consciousness  Medication Review  Magnesium Oxide, Vitamin D3, amoxicillin-clavulanate, lisinopril, omeprazole, oxyCODONE, sildenafil, and vitamin B-12  History Review  Allergy: Mr. Amsler is allergic to levaquin [levofloxacin in d5w]. Drug: Mr. Bodi  reports current drug use. Drug: Oxycodone. Alcohol:  reports no history of alcohol use. Tobacco:  reports that he quit smoking about 12 years ago. He has never used smokeless  tobacco. Social: Mr. Jokerst  reports that he quit smoking about 12 years ago. He has never used smokeless tobacco. He reports current drug use. Drug: Oxycodone. He reports that he does not drink alcohol. Medical:  has a past medical history of Bell's palsy (2015), Chronic pain syndrome, DDD (degenerative disc disease), lumbar, HTN (hypertension), and Morbid obesity ([place]) (08/16/2014). Surgical: Mr. Levitz  has a past surgical history that includes Back surgery (2012) and Cholecystectomy. Family: family history includes COPD in his mother; Hypertension in an other family member.  Laboratory Chemistry Profile   Renal Lab Results  Component Value Date   BUN 14 07/19/2019   CREATININE 0.85 07/19/2019    BCR 11 02/06/2019   GFR 97.98 07/19/2019   GFRAA 129 02/06/2019   GFRNONAA 112 02/06/2019     Hepatic Lab Results  Component Value Date   AST 15 07/19/2019   ALT 11 07/19/2019   ALBUMIN 4.3 07/19/2019   ALKPHOS 45 07/19/2019   LIPASE 153 09/21/2012     Electrolytes Lab Results  Component Value Date   NA 141 07/19/2019   K 4.4 07/19/2019   CL 104 07/19/2019   CALCIUM 9.2 07/19/2019   MG 1.9 07/19/2019     Bone Lab Results  Component Value Date   VD25OH 48.99 07/19/2019   25OHVITD1 35 02/06/2019   25OHVITD2 <1.0 02/06/2019   25OHVITD3 34 02/06/2019     Inflammation (CRP: Acute Phase) (ESR: Chronic Phase) Lab Results  Component Value Date   CRP 9 02/06/2019   ESRSEDRATE 6 02/06/2019       Note: Above Lab results reviewed.  Recent Imaging Review  DG Foot Complete Right CLINICAL DATA:  Jarring foot 3 days prior, no fall  EXAM: RIGHT FOOT COMPLETE - 3+ VIEW  COMPARISON:  Radiograph 03/24/2012  FINDINGS: No acute bony abnormality. Specifically, no fracture, subluxation, or dislocation. Mild degenerative changes are noted in the midfoot. Posterior calcaneal spur is present. Trace ankle effusion. Soft tissues are otherwise unremarkable  IMPRESSION: No acute fracture or traumatic malalignment.  Mild degenerative changes in the midfoot.  Posterior calcaneal spur.  Trace ankle effusion.  Electronically Signed   By: Lovena Le M.D.   On: 02/12/2020 19:03 Note: Reviewed        Physical Exam  General appearance: Well nourished, well developed, and well hydrated. In no apparent acute distress Mental status: Alert, oriented x 3 (person, place, & time)       Respiratory: No evidence of acute respiratory distress Eyes: PERLA Vitals: BP (!) 135/93    Pulse 79    Temp 97.7 F (36.5 C)    Ht 6' 2" (1.88 m)    Wt 250 lb (113.4 kg)    SpO2 99%    BMI 32.10 kg/m  BMI: Estimated body mass index is 32.1 kg/m as calculated from the following:   Height as of  this encounter: 6' 2" (1.88 m).   Weight as of this encounter: 250 lb (113.4 kg). Ideal: Ideal body weight: 82.2 kg (181 lb 3.5 oz) Adjusted ideal body weight: 94.7 kg (208 lb 11.7 oz)  Assessment   Status Diagnosis  Controlled Controlled Controlled 1. Chronic pain syndrome   2. Chronic low back pain (Primary Area of Pain) (Bilateral) (R>L)   3. Chronic hip pain (Secondary area of Pain) (Right)   4. Chronic upper back pain (Third area of Pain)   5. Chronic shoulder pain (Fourth area of Pain) (Bilateral) (Right)   6. Pharmacologic therapy   7.  Low magnesium levels      Updated Problems: No problems updated.  Plan of Care  Problem-specific:  No problem-specific Assessment & Plan notes found for this encounter.  Mr. MARTHA SOLTYS has a current medication list which includes the following long-term medication(s): vitamin d3, lisinopril, magnesium oxide, omeprazole, [START ON 11/02/2020] oxycodone, [START ON 12/02/2020] oxycodone, [START ON 01/01/2021] oxycodone, and sildenafil.  Pharmacotherapy (Medications Ordered): Meds ordered this encounter  Medications   oxyCODONE (OXY IR/ROXICODONE) 5 MG immediate release tablet    Sig: Take 1 tablet (5 mg total) by mouth every 6 (six) hours as needed for severe pain. Must last 30 days    Dispense:  120 tablet    Refill:  0    Chronic Pain: STOP Act (Not applicable) Fill 1 day early if closed on refill date. Avoid benzodiazepines within 8 hours of opioids   oxyCODONE (OXY IR/ROXICODONE) 5 MG immediate release tablet    Sig: Take 1 tablet (5 mg total) by mouth every 6 (six) hours as needed for severe pain. Must last 30 days    Dispense:  120 tablet    Refill:  0    Chronic Pain: STOP Act (Not applicable) Fill 1 day early if closed on refill date. Avoid benzodiazepines within 8 hours of opioids   oxyCODONE (OXY IR/ROXICODONE) 5 MG immediate release tablet    Sig: Take 1 tablet (5 mg total) by mouth every 6 (six) hours as needed for  severe pain. Must last 30 days    Dispense:  120 tablet    Refill:  0    Chronic Pain: STOP Act (Not applicable) Fill 1 day early if closed on refill date. Avoid benzodiazepines within 8 hours of opioids   Magnesium Oxide 500 MG CAPS    Sig: Take 1 capsule (500 mg total) by mouth daily.    Dispense:  30 capsule    Refill:  2    Fill 1 day early if pharmacy is closed on scheduled refill date. Generic permitted. Void old duplicate prescription / refill. Do not send renewal requests.   Orders:  No orders of the defined types were placed in this encounter.  Follow-up plan:   Return in about 3 months (around 01/31/2021) for (F2F), (Med Mgmt).      Interventional treatment options: Planned, scheduled, and/or pending:   Not at this time.   Under consideration:   Diagnostic right CESI  Diagnostic thoracic ESI  Diagnostic bilateral thoracic facet block  Possible bilateral thoracic facet RFA  Diagnostic bilateral lumbar facet block  Possible bilateral lumbar facet RFA  Diagnostic right sacroiliac joint block  Possible right sacroiliac joint RFA  Diagnostic caudal ESI and epidurogram  Diagnostic right-sided L4-5 TFESI  Diagnostic right IA knee injection  Diagnostic right genicular NB  Possible right genicular RFA    Therapeutic/palliative (PRN):   None at this time    Recent Visits Date Type Provider Dept  07/31/20 Office Visit Gillis Santa, MD [facility]  Showing recent visits within past 90 days and meeting all other requirements Today's Visits Date Type Provider Dept  10/28/20 Office Visit Milinda Pointer, MD [facility]  Showing today's visits and meeting all other requirements Future Appointments No visits were found meeting these conditions. Showing future appointments within next 90 days and meeting all other requirements  I discussed the assessment and treatment plan with the patient. The patient was provided an opportunity to ask questions and  all were answered. The patient agreed with  the plan and demonstrated an understanding of the instructions.  Patient advised to call back or seek an in-person evaluation if the symptoms or condition worsens.  Duration of encounter: 30 minutes.  Note by: Gaspar Cola, MD Date: 10/28/2020; Time: 4:00 PM

## 2020-10-28 ENCOUNTER — Ambulatory Visit: Payer: Medicare Other | Attending: Pain Medicine | Admitting: Pain Medicine

## 2020-10-28 ENCOUNTER — Encounter: Payer: Self-pay | Admitting: Pain Medicine

## 2020-10-28 ENCOUNTER — Other Ambulatory Visit: Payer: Self-pay

## 2020-10-28 VITALS — BP 135/93 | HR 79 | Temp 97.7°F | Ht 74.0 in | Wt 250.0 lb

## 2020-10-28 DIAGNOSIS — M25512 Pain in left shoulder: Secondary | ICD-10-CM

## 2020-10-28 DIAGNOSIS — Z79899 Other long term (current) drug therapy: Secondary | ICD-10-CM | POA: Diagnosis not present

## 2020-10-28 DIAGNOSIS — R790 Abnormal level of blood mineral: Secondary | ICD-10-CM | POA: Diagnosis not present

## 2020-10-28 DIAGNOSIS — M549 Dorsalgia, unspecified: Secondary | ICD-10-CM | POA: Diagnosis not present

## 2020-10-28 DIAGNOSIS — M25551 Pain in right hip: Secondary | ICD-10-CM | POA: Diagnosis not present

## 2020-10-28 DIAGNOSIS — G894 Chronic pain syndrome: Secondary | ICD-10-CM | POA: Diagnosis not present

## 2020-10-28 DIAGNOSIS — M545 Low back pain, unspecified: Secondary | ICD-10-CM

## 2020-10-28 DIAGNOSIS — M25511 Pain in right shoulder: Secondary | ICD-10-CM | POA: Diagnosis not present

## 2020-10-28 DIAGNOSIS — G8929 Other chronic pain: Secondary | ICD-10-CM

## 2020-10-28 MED ORDER — OXYCODONE HCL 5 MG PO TABS: 5.0000 mg | ORAL_TABLET | Freq: Four times a day (QID) | ORAL | 0 refills | Status: DC | PRN

## 2020-10-28 MED ORDER — MAGNESIUM OXIDE -MG SUPPLEMENT 500 MG PO CAPS: 1.0000 | ORAL_CAPSULE | Freq: Every day | ORAL | 2 refills | Status: AC

## 2020-10-28 NOTE — Progress Notes (Signed)
Nursing Pain Medication Assessment:  Safety precautions to be maintained throughout the outpatient stay will include: orient to surroundings, keep bed in low position, maintain call bell within reach at all times, provide assistance with transfer out of bed and ambulation.  Medication Inspection Compliance: Pill count conducted under aseptic conditions, in front of the patient. Neither the pills nor the bottle was removed from the patient's sight at any time. Once count was completed pills were immediately returned to the patient in their original bottle.  Medication: Oxycodone IR Pill/Patch Count: 15 of 120 pills remain Pill/Patch Appearance: Markings consistent with prescribed medication Bottle Appearance: Standard pharmacy container. Clearly labeled. Filled Date: 10 / 20 / 21 Last Medication intake:  TodaySafety precautions to be maintained throughout the outpatient stay will include: orient to surroundings, keep bed in low position, maintain call bell within reach at all times, provide assistance with transfer out of bed and ambulation.

## 2020-10-28 NOTE — Patient Instructions (Signed)
____________________________________________________________________________________________  CBD (cannabidiol) WARNING  Applicable to: All individuals currently taking or considering taking CBD (cannabidiol) and, more important, all patients taking opioid analgesic controlled substances (pain medication). (Example: oxycodone; oxymorphone; hydrocodone; hydromorphone; morphine; methadone; tramadol; tapentadol; fentanyl; buprenorphine; butorphanol; dextromethorphan; meperidine; codeine; etc.)  Legal status: CBD remains a Schedule I drug prohibited for any use. CBD is illegal with one exception. In the [place], CBD has a limited Food and Drug Administration (FDA) approval for the treatment of two specific types of epilepsy disorders. Only one CBD product has been approved by the FDA for this purpose: "Epidiolex". FDA is aware that some companies are marketing products containing cannabis and cannabis-derived compounds in ways that violate the Federal Food, Drug and Cosmetic Act (FD&C Act) and that may put the health and safety of consumers at risk. The FDA, a Federal agency, has not enforced the CBD status since 2018.   Legality: Some manufacturers ship CBD products nationally, which is illegal. Often such products are sold online and are therefore available throughout the country. CBD is openly sold in head shops and health food stores in some states where such sales have not been explicitly legalized. Selling unapproved products with unsubstantiated therapeutic claims is not only a violation of the law, but also can put patients at risk, as these products have not been proven to be safe or effective. Federal illegality makes it difficult to conduct research on CBD.  Reference: "FDA Regulation of Cannabis and Cannabis-Derived Products, Including Cannabidiol (CBD)" -  https://www.fda.gov/news-events/public-health-focus/fda-regulation-cannabis-and-cannabis-derived-products-including-cannabidiol-cbd  Warning: CBD is not FDA approved and has not undergo the same manufacturing controls as prescription drugs.  This means that the purity and safety of available CBD may be questionable. Most of the time, despite manufacturer's claims, it is contaminated with THC (delta-9-tetrahydrocannabinol - the chemical in marijuana responsible for the "HIGH").  When this is the case, the THC contaminant will trigger a positive urine drug screen (UDS) test for Marijuana (carboxy-THC). Because a positive UDS for any illicit substance is a violation of our medication agreement, your opioid analgesics (pain medicine) may be permanently discontinued.  MORE ABOUT CBD  General Information: CBD  is a derivative of the Marijuana (cannabis sativa) plant discovered in 1940. It is one of the 113 identified substances found in Marijuana. It accounts for up to 40% of the plant's extract. As of 2018, preliminary clinical studies on CBD included research for the treatment of anxiety, movement disorders, and pain. CBD is available and consumed in multiple forms, including inhalation of smoke or vapor, as an aerosol spray, and by mouth. It may be supplied as an oil containing CBD, capsules, dried cannabis, or as a liquid solution. CBD is thought not to be as psychoactive as THC (delta-9-tetrahydrocannabinol - the chemical in marijuana responsible for the "HIGH"). Studies suggest that CBD may interact with different biological target receptors in the body, including cannabinoid and other neurotransmitter receptors. As of 2018 the mechanism of action for its biological effects has not been determined.  Side-effects  Adverse reactions: Dry mouth, diarrhea, decreased appetite, fatigue, drowsiness, malaise, weakness, sleep disturbances, and others.  Drug interactions: CBC may interact with other medications  such as blood-thinners. (Last update: 07/20/2020) ____________________________________________________________________________________________   ____________________________________________________________________________________________  Medication Rules  Purpose: To inform patients, and their family members, of our rules and regulations.  Applies to: All patients receiving prescriptions (written or electronic).  Pharmacy of record: Pharmacy where electronic prescriptions will be sent. If written prescriptions are taken to a different pharmacy, please inform   the nursing staff. The pharmacy listed in the electronic medical record should be the one where you would like electronic prescriptions to be sent.  Electronic prescriptions: In compliance with the [place] Strengthen Opioid Misuse Prevention (STOP) Act of 2017 (Session Law 2017-74/H243), effective December 14, 2018, all controlled substances must be electronically prescribed. Calling prescriptions to the pharmacy will cease to exist.  Prescription refills: Only during scheduled appointments. Applies to all prescriptions.  NOTE: The following applies primarily to controlled substances (Opioid* Pain Medications).   Type of encounter (visit): For patients receiving controlled substances, face-to-face visits are required. (Not an option or up to the patient.)  Patient's responsibilities: 1. Pain Pills: Bring all pain pills to every appointment (except for procedure appointments). 2. Pill Bottles: Bring pills in original pharmacy bottle. Always bring the newest bottle. Bring bottle, even if empty. 3. Medication refills: You are responsible for knowing and keeping track of what medications you take and those you need refilled. The day before your appointment: write a list of all prescriptions that need to be refilled. The day of the appointment: give the list to the admitting nurse. Prescriptions will be written only during  appointments. No prescriptions will be written on procedure days. If you forget a medication: it will not be "Called in", "Faxed", or "electronically sent". You will need to get another appointment to get these prescribed. No early refills. Do not call asking to have your prescription filled early. 4. Prescription Accuracy: You are responsible for carefully inspecting your prescriptions before leaving our office. Have the discharge nurse carefully go over each prescription with you, before taking them home. Make sure that your name is accurately spelled, that your address is correct. Check the name and dose of your medication to make sure it is accurate. Check the number of pills, and the written instructions to make sure they are clear and accurate. Make sure that you are given enough medication to last until your next medication refill appointment. 5. Taking Medication: Take medication as prescribed. When it comes to controlled substances, taking less pills or less frequently than prescribed is permitted and encouraged. Never take more pills than instructed. Never take medication more frequently than prescribed.  6. Inform other Doctors: Always inform, all of your healthcare providers, of all the medications you take. 7. Pain Medication from other Providers: You are not allowed to accept any additional pain medication from any other Doctor or Healthcare provider. There are two exceptions to this rule. (see below) In the event that you require additional pain medication, you are responsible for notifying us, as stated below. 8. Medication Agreement: You are responsible for carefully reading and following our Medication Agreement. This must be signed before receiving any prescriptions from our practice. Safely store a copy of your signed Agreement. Violations to the Agreement will result in no further prescriptions. (Additional copies of our Medication Agreement are available upon request.) 9. Laws, Rules,  & Regulations: All patients are expected to follow all Federal and State Laws, Statutes, Rules, & Regulations. Ignorance of the Laws does not constitute a valid excuse.  10. Illegal drugs and Controlled Substances: The use of illegal substances (including, but not limited to marijuana and its derivatives) and/or the illegal use of any controlled substances is strictly prohibited. Violation of this rule may result in the immediate and permanent discontinuation of any and all prescriptions being written by our practice. The use of any illegal substances is prohibited. 11. Adopted CDC guidelines & recommendations: Target dosing   levels will be at or below 60 MME/day. Use of benzodiazepines** is not recommended.  Exceptions: There are only two exceptions to the rule of not receiving pain medications from other Healthcare Providers. 1. Exception #1 (Emergencies): In the event of an emergency (i.e.: accident requiring emergency care), you are allowed to receive additional pain medication. However, you are responsible for: As soon as you are able, call our office [phone], at any time of the day or night, and leave a message stating your name, the date and nature of the emergency, and the name and dose of the medication prescribed. In the event that your call is answered by a member of our staff, make sure to document and save the date, time, and the name of the person that took your information.  2. Exception #2 (Planned Surgery): In the event that you are scheduled by another doctor or dentist to have any type of surgery or procedure, you are allowed (for a period no longer than 30 days), to receive additional pain medication, for the acute post-op pain. However, in this case, you are responsible for picking up a copy of our "Post-op Pain Management for Surgeons" handout, and giving it to your surgeon or dentist. This document is available at our office, and does not require an appointment to obtain it. Simply  go to our office during business hours (Monday-Thursday from 8:00 AM to 4:00 PM) (Friday 8:00 AM to 12:00 Noon) or if you have a scheduled appointment with us, prior to your surgery, and ask for it by name. In addition, you are responsible for: calling our office [phone], at any time of the day or night, and leaving a message stating your name, name of your surgeon, type of surgery, and date of procedure or surgery. Failure to comply with your responsibilities may result in termination of therapy involving the controlled substances.  *Opioid medications include: morphine, codeine, oxycodone, oxymorphone, hydrocodone, hydromorphone, meperidine, tramadol, tapentadol, buprenorphine, fentanyl, methadone. **Benzodiazepine medications include: diazepam (Valium), alprazolam (Xanax), clonazepam (Klonopine), lorazepam (Ativan), clorazepate (Tranxene), chlordiazepoxide (Librium), estazolam (Prosom), oxazepam (Serax), temazepam (Restoril), triazolam (Halcion) (Last updated: 08/20/2020) ____________________________________________________________________________________________   ____________________________________________________________________________________________  Medication Recommendations and Reminders  Applies to: All patients receiving prescriptions (written and/or electronic).  Medication Rules & Regulations: These rules and regulations exist for your safety and that of others. They are not flexible and neither are we. Dismissing or ignoring them will be considered "non-compliance" with medication therapy, resulting in complete and irreversible termination of such therapy. (See document titled "Medication Rules" for more details.) In all conscience, because of safety reasons, we cannot continue providing a therapy where the patient does not follow instructions.  Pharmacy of record:   Definition: This is the pharmacy where your electronic prescriptions will be sent.   We do not endorse any  particular pharmacy, however, we have experienced problems with [facility] not securing enough medication supply for the community.  We do not restrict you in your choice of pharmacy. However, once we write for your prescriptions, we will NOT be re-sending more prescriptions to fix restricted supply problems created by your pharmacy, or your insurance.   The pharmacy listed in the electronic medical record should be the one where you want electronic prescriptions to be sent.  If you choose to change pharmacy, simply notify our nursing staff.  Recommendations:  Keep all of your pain medications in a safe place, under lock and key, even if you live alone. We will NOT replace lost, stolen, or   damaged medication.  After you fill your prescription, take 1 week's worth of pills and put them away in a safe place. You should keep a separate, properly labeled bottle for this purpose. The remainder should be kept in the original bottle. Use this as your primary supply, until it runs out. Once it's gone, then you know that you have 1 week's worth of medicine, and it is time to come in for a prescription refill. If you do this correctly, it is unlikely that you will ever run out of medicine.  To make sure that the above recommendation works, it is very important that you make sure your medication refill appointments are scheduled at least 1 week before you run out of medicine. To do this in an effective manner, make sure that you do not leave the office without scheduling your next medication management appointment. Always ask the nursing staff to show you in your prescription , when your medication will be running out. Then arrange for the receptionist to get you a return appointment, at least 7 days before you run out of medicine. Do not wait until you have 1 or 2 pills left, to come in. This is very poor planning and does not take into consideration that we may need to cancel appointments due to bad weather,  sickness, or emergencies affecting our staff.  DO NOT ACCEPT A "Partial Fill": If for any reason your pharmacy does not have enough pills/tablets to completely fill or refill your prescription, do not allow for a "partial fill". The law allows the pharmacy to complete that prescription within 72 hours, without requiring a new prescription. If they do not fill the rest of your prescription within those 72 hours, you will need a separate prescription to fill the remaining amount, which we will NOT provide. If the reason for the partial fill is your insurance, you will need to talk to the pharmacist about payment alternatives for the remaining tablets, but again, DO NOT ACCEPT A PARTIAL FILL, unless you can trust your pharmacist to obtain the remainder of the pills within 72 hours.  Prescription refills and/or changes in medication(s):   Prescription refills, and/or changes in dose or medication, will be conducted only during scheduled medication management appointments. (Applies to both, written and electronic prescriptions.)  No refills on procedure days. No medication will be changed or started on procedure days. No changes, adjustments, and/or refills will be conducted on a procedure day. Doing so will interfere with the diagnostic portion of the procedure.  No phone refills. No medications will be "called into the pharmacy".  No Fax refills.  No weekend refills.  No Holliday refills.  No after hours refills.  Remember:  Business hours are:  Monday to Thursday 8:00 AM to 4:00 PM Provider's Schedule: Rashad Obeid, MD - Appointments are:  Medication management: Monday and Wednesday 8:00 AM to 4:00 PM Procedure day: Tuesday and Thursday 7:30 AM to 4:00 PM Bilal Lateef, MD - Appointments are:  Medication management: Tuesday and Thursday 8:00 AM to 4:00 PM Procedure day: Monday and Wednesday 7:30 AM to 4:00 PM (Last update:  07/03/2020) ____________________________________________________________________________________________    

## 2020-12-11 ENCOUNTER — Encounter: Payer: Self-pay | Admitting: Family Medicine

## 2020-12-11 ENCOUNTER — Other Ambulatory Visit: Payer: Self-pay

## 2020-12-11 ENCOUNTER — Telehealth (INDEPENDENT_AMBULATORY_CARE_PROVIDER_SITE_OTHER): Payer: Medicare Other | Admitting: Family Medicine

## 2020-12-11 DIAGNOSIS — R059 Cough, unspecified: Secondary | ICD-10-CM | POA: Insufficient documentation

## 2020-12-11 NOTE — Assessment & Plan Note (Signed)
Anticipate viral URI into bronchitis, anticipate viral.  Offered COVID swab - declines.  Supportive care reviewed. rec start mucinex.  Update if worsening symptoms or signs of bacterial infection to consider abx course.

## 2020-12-11 NOTE — Progress Notes (Signed)
Patient ID: Larry Goodwin, male    DOB: 1976-05-01, 44 y.o.   MRN: [id number]  Virtual visit completed through MyChart, a video enabled telemedicine application. Due to national recommendations of social distancing due to COVID-19, a virtual visit is felt to be most appropriate for this patient at this time. Reviewed limitations, risks, security and privacy concerns of performing a virtual visit and the availability of in person appointments. I also reviewed that there may be a patient responsible charge related to this service. The patient agreed to proceed.   Patient location: out of town in [place]  Provider location: Adult nurse at [facility], office Persons participating in this virtual visit: patient, provider   If any vitals were documented, they were collected by patient at home unless specified below.    Pulse 83   Temp 97.7 F (36.5 C)   Ht 6\' 2"  (1.88 m)   SpO2 99%   BMI 32.10 kg/m   CC: cough, head congestion Subjective:   HPI: Larry Goodwin is a 44 y.o. male presenting on 12/11/2020 for Nasal Congestion (C/o head, chest congestion, achiness and fatigue. Sxs started on 12/05/20.  Denies and other sxs and cough other than force cough to bring up mucous. )   6d h/o cough, head = chest congestion, fatigue, malaise. Some colored mucous from productive cough. Facial pressure. PNDrainage. Sleeping ok at night.  No fevers/chills, ear or tooth pain, ST, abd pain, nausea, diarrhea, dyspnea or wheezing. No loss of taste or smell. Good appetite.  Good O2 sats throughout.  Managing symptoms with vicks humidifier, hot shower. Doesn't like to use flonase. Continues vit C and vit D.  Ex smoker quit 2009.  Family members feel fine.   Not COVID vaccinated - declines. No known COVID exposure.   Tends to get recurrent sinusitis infections, latest 10/2020 treated with augmentin course with benefit:  10/2020 - sinusitis treated with augmentin course 01/2020 - sinusitis  treated with augmentin course 09/2019 - sinus congestion treated with flonase and mucinex with improvement 07/2019 - sinusitis treated with augmentin course - never fully improved, did not take doxy course prescribed.  03/2019 - sinusitis treated with augmentin course 11/2018 - sinusitis treated with augmentin course 08/2018 - sinusitis treated with augmentin course And more over the years. Does fully improve after abx course.  Referred toENT8/2020 - has not been able to see due to outstanding bill.     Relevant past medical, surgical, family and social history reviewed and updated as indicated. Interim medical history since our last visit reviewed. Allergies and medications reviewed and updated. Outpatient Medications Prior to Visit  Medication Sig Dispense Refill  . Cholecalciferol (VITAMIN D3) 1.25 MG (50000 UT) CAPS Take by mouth in the morning and at bedtime.    Marland Kitchen lisinopril (ZESTRIL) 40 MG tablet TAKE 1 TABLET BY MOUTH EVERY DAY 90 tablet 0  . Magnesium Oxide 500 MG CAPS Take 1 capsule (500 mg total) by mouth daily. 30 capsule 2  . omeprazole (PRILOSEC) 40 MG capsule Take 1 capsule (40 mg total) by mouth daily. Daily for 3 weeks then as needed 30 capsule 1  . oxyCODONE (OXY IR/ROXICODONE) 5 MG immediate release tablet Take 1 tablet (5 mg total) by mouth every 6 (six) hours as needed for severe pain. Must last 30 days 120 tablet 0  . [START ON 01/01/2021] oxyCODONE (OXY IR/ROXICODONE) 5 MG immediate release tablet Take 1 tablet (5 mg total) by mouth every 6 (six) hours as needed  for severe pain. Must last 30 days 120 tablet 0  . sildenafil (VIAGRA) 100 MG tablet Take 0.5-1 tablets (50-100 mg total) by mouth daily as needed for erectile dysfunction. 5 tablet 3  . vitamin B-12 (CYANOCOBALAMIN) 1000 MCG tablet Take 1,000 mcg by mouth daily.    Marland Kitchen oxyCODONE (OXY IR/ROXICODONE) 5 MG immediate release tablet Take 1 tablet (5 mg total) by mouth every 6 (six) hours as needed for severe pain.  Must last 30 days 120 tablet 0   No facility-administered medications prior to visit.     Per HPI unless specifically indicated in ROS section below Review of Systems Objective:  Pulse 83   Temp 97.7 F (36.5 C)   Ht 6\' 2"  (1.88 m)   SpO2 99%   BMI 32.10 kg/m   Wt Readings from Last 3 Encounters:  10/28/20 250 lb (113.4 kg)  07/31/20 250 lb (113.4 kg)  02/12/20 260 lb (117.9 kg)       Physical exam: Gen: alert, NAD, not ill appearing Pulm: speaks in complete sentences without increased work of breathing Psych: normal mood, normal thought content      Assessment & Plan:   Problem List Items Addressed This Visit    Cough    Anticipate viral URI into bronchitis, anticipate viral.  Offered COVID swab - declines.  Supportive care reviewed. rec start mucinex.  Update if worsening symptoms or signs of bacterial infection to consider abx course.           No orders of the defined types were placed in this encounter.  No orders of the defined types were placed in this encounter.   I discussed the assessment and treatment plan with the patient. The patient was provided an opportunity to ask questions and all were answered. The patient agreed with the plan and demonstrated an understanding of the instructions. The patient was advised to call back or seek an in-person evaluation if the symptoms worsen or if the condition fails to improve as anticipated.  Follow up plan: Return if symptoms worsen or fail to improve.  04/13/20, MD

## 2020-12-15 MED ORDER — AMOXICILLIN-POT CLAVULANATE 875-125 MG PO TABS: 1.0000 | ORAL_TABLET | Freq: Two times a day (BID) | ORAL | 0 refills | Status: AC

## 2020-12-15 NOTE — Addendum Note (Signed)
Addended by: Eustaquio Boyden on: 12/15/2020 10:07 PM   Modules accepted: Orders

## 2020-12-19 ENCOUNTER — Other Ambulatory Visit: Payer: Self-pay

## 2020-12-19 ENCOUNTER — Other Ambulatory Visit: Payer: Self-pay | Admitting: Family Medicine

## 2020-12-19 ENCOUNTER — Ambulatory Visit: Payer: Medicare Other

## 2020-12-19 DIAGNOSIS — E786 Lipoprotein deficiency: Secondary | ICD-10-CM

## 2020-12-19 DIAGNOSIS — Z8639 Personal history of other endocrine, nutritional and metabolic disease: Secondary | ICD-10-CM

## 2020-12-19 DIAGNOSIS — E538 Deficiency of other specified B group vitamins: Secondary | ICD-10-CM

## 2020-12-19 DIAGNOSIS — E559 Vitamin D deficiency, unspecified: Secondary | ICD-10-CM

## 2020-12-20 ENCOUNTER — Other Ambulatory Visit: Payer: Medicare Other

## 2020-12-24 ENCOUNTER — Other Ambulatory Visit: Payer: Medicare Other

## 2020-12-27 ENCOUNTER — Ambulatory Visit (INDEPENDENT_AMBULATORY_CARE_PROVIDER_SITE_OTHER): Payer: Medicare Other | Admitting: Family Medicine

## 2020-12-27 ENCOUNTER — Encounter: Payer: Self-pay | Admitting: Family Medicine

## 2020-12-27 ENCOUNTER — Ambulatory Visit (INDEPENDENT_AMBULATORY_CARE_PROVIDER_SITE_OTHER): Payer: Medicare Other

## 2020-12-27 ENCOUNTER — Other Ambulatory Visit: Payer: Self-pay

## 2020-12-27 VITALS — BP 136/86 | HR 90 | Temp 97.9°F | Ht 73.0 in | Wt 313.3 lb

## 2020-12-27 DIAGNOSIS — Z6835 Body mass index (BMI) 35.0-35.9, adult: Secondary | ICD-10-CM | POA: Diagnosis not present

## 2020-12-27 DIAGNOSIS — Z0000 Encounter for general adult medical examination without abnormal findings: Secondary | ICD-10-CM | POA: Diagnosis not present

## 2020-12-27 DIAGNOSIS — G894 Chronic pain syndrome: Secondary | ICD-10-CM

## 2020-12-27 DIAGNOSIS — E559 Vitamin D deficiency, unspecified: Secondary | ICD-10-CM

## 2020-12-27 DIAGNOSIS — E538 Deficiency of other specified B group vitamins: Secondary | ICD-10-CM | POA: Diagnosis not present

## 2020-12-27 DIAGNOSIS — R790 Abnormal level of blood mineral: Secondary | ICD-10-CM | POA: Diagnosis not present

## 2020-12-27 DIAGNOSIS — Z8639 Personal history of other endocrine, nutritional and metabolic disease: Secondary | ICD-10-CM | POA: Diagnosis not present

## 2020-12-27 DIAGNOSIS — Z7189 Other specified counseling: Secondary | ICD-10-CM

## 2020-12-27 DIAGNOSIS — I10 Essential (primary) hypertension: Secondary | ICD-10-CM

## 2020-12-27 MED ORDER — LISINOPRIL 40 MG PO TABS
40.0000 mg | ORAL_TABLET | Freq: Every day | ORAL | 3 refills | Status: DC
Start: 2020-12-27 — End: 2021-12-30

## 2020-12-27 NOTE — Patient Instructions (Addendum)
Consider colon cancer screening options - we will discuss again next year  Consider tetanus shot.  Work on low carb diet for goal weight loss.  Labs today.  Return as needed or in 1 year for next medicare wellness visit.   Health Maintenance, Male Adopting a healthy lifestyle and getting preventive care are important in promoting health and wellness. Ask your health care provider about:  The right schedule for you to have regular tests and exams.  Things you can do on your own to prevent diseases and keep yourself healthy. What should I know about diet, weight, and exercise? Eat a healthy diet  Eat a diet that includes plenty of vegetables, fruits, low-fat dairy products, and lean protein.  Do not eat a lot of foods that are high in solid fats, added sugars, or sodium.   Maintain a healthy weight Body mass index (BMI) is a measurement that can be used to identify possible weight problems. It estimates body fat based on height and weight. Your health care provider can help determine your BMI and help you achieve or maintain a healthy weight. Get regular exercise Get regular exercise. This is one of the most important things you can do for your health. Most adults should:  Exercise for at least 150 minutes each week. The exercise should increase your heart rate and make you sweat (moderate-intensity exercise).  Do strengthening exercises at least twice a week. This is in addition to the moderate-intensity exercise.  Spend less time sitting. Even light physical activity can be beneficial. Watch cholesterol and blood lipids Have your blood tested for lipids and cholesterol at 46 years of age, then have this test every 5 years. You may need to have your cholesterol levels checked more often if:  Your lipid or cholesterol levels are high.  You are older than 46 years of age.  You are at high risk for heart disease. What should I know about cancer screening? Many types of cancers can  be detected early and may often be prevented. Depending on your health history and family history, you may need to have cancer screening at various ages. This may include screening for:  Colorectal cancer.  Prostate cancer.  Skin cancer.  Lung cancer. What should I know about heart disease, diabetes, and high blood pressure? Blood pressure and heart disease  High blood pressure causes heart disease and increases the risk of stroke. This is more likely to develop in people who have high blood pressure readings, are of African descent, or are overweight.  Talk with your health care provider about your target blood pressure readings.  Have your blood pressure checked: ? Every 3-5 years if you are 20-16 years of age. ? Every year if you are 49 years old or older.  If you are between the ages of 4 and 90 and are a current or former smoker, ask your health care provider if you should have a one-time screening for abdominal aortic aneurysm (AAA). Diabetes Have regular diabetes screenings. This checks your fasting blood sugar level. Have the screening done:  Once every three years after age 53 if you are at a normal weight and have a low risk for diabetes.  More often and at a younger age if you are overweight or have a high risk for diabetes. What should I know about preventing infection? Hepatitis B If you have a higher risk for hepatitis B, you should be screened for this virus. Talk with your health care provider to  find out if you are at risk for hepatitis B infection. Hepatitis C Blood testing is recommended for:  Everyone born from [birth year] through 1966.  Anyone with known risk factors for hepatitis C. Sexually transmitted infections (STIs)  You should be screened each year for STIs, including gonorrhea and chlamydia, if: ? You are sexually active and are younger than 46 years of age. ? You are older than 46 years of age and your health care provider tells you that you are at risk  for this type of infection. ? Your sexual activity has changed since you were last screened, and you are at increased risk for chlamydia or gonorrhea. Ask your health care provider if you are at risk.  Ask your health care provider about whether you are at high risk for HIV. Your health care provider may recommend a prescription medicine to help prevent HIV infection. If you choose to take medicine to prevent HIV, you should first get tested for HIV. You should then be tested every 3 months for as long as you are taking the medicine. Follow these instructions at home: Lifestyle  Do not use any products that contain nicotine or tobacco, such as cigarettes, e-cigarettes, and chewing tobacco. If you need help quitting, ask your health care provider.  Do not use street drugs.  Do not share needles.  Ask your health care provider for help if you need support or information about quitting drugs. Alcohol use  Do not drink alcohol if your health care provider tells you not to drink.  If you drink alcohol: ? Limit how much you have to 0-2 drinks a day. ? Be aware of how much alcohol is in your drink. In the [place] one drink equals one 12 oz bottle of beer (355 mL), one 5 oz glass of wine (148 mL), or one 1 oz glass of hard liquor (44 mL). General instructions  Schedule regular health, dental, and eye exams.  Stay current with your vaccines.  Tell your health care provider if: ? You often feel depressed. ? You have ever been abused or do not feel safe at home. Summary  Adopting a healthy lifestyle and getting preventive care are important in promoting health and wellness.  Follow your health care provider's instructions about healthy diet, exercising, and getting tested or screened for diseases.  Follow your health care provider's instructions on monitoring your cholesterol and blood pressure. This information is not intended to replace advice given to you by your health care provider.  Make sure you discuss any questions you have with your health care provider. Document Revised: 11/23/2018 Document Reviewed: 11/23/2018 Elsevier Patient Education  2021 [employer].

## 2020-12-27 NOTE — Progress Notes (Signed)
Patient ID: Larry Goodwin, male    DOB: 05-18-1976, 45 y.o.   MRN: [id number]  This visit was conducted in person.  BP 136/86 (BP Location: Left Arm, Patient Position: Sitting, Cuff Size: Large)   Pulse 90   Temp 97.9 F (36.6 C) (Temporal)   Ht 6\' 1"  (1.854 m)   Wt (!) 313 lb 5 oz (142.1 kg)   SpO2 97%   BMI 41.34 kg/m    CC: medicare wellness visit f/u visit Subjective:   HPI: Larry Goodwin is a 45 y.o. male presenting on 12/27/2020 for Annual Exam   Saw health advisor today for medicare wellness visit. Note reviewed.   No exam data present  Flowsheet Row Clinical Support from 12/27/2020 in [facility] [place]  PHQ-2 Total Score 0      Fall Risk  12/27/2020 10/28/2020 07/31/2020 07/17/2019 02/06/2019  Falls in the past year? 0 0 0 0 0  Number falls in past yr: 0 - - - -  Injury with Fall? 0 - - - -  Risk for fall due to : Medication side effect - - Medication side effect -  Follow up Falls evaluation completed;Falls prevention discussed - - Falls evaluation completed;Falls prevention discussed -      Last meal was 8:30am this morning.  09/2020 went off low carb diet. Marked weight gain over the past 6 months. ?43 lbs. Planning to restart low carb diet, regular walking.  Expecting twins 03/2021.   Preventative: Colon cancer screening - discussed options, declines testing. Wants to consider next year.  No fmhx prostate cancer Flu shot - declines COVID vaccine - declines Tetanus shot - thinks he had this at ER Advanced directive - does not have this set up. Would want Danielle fiance to be HCPOA.  Seat belt use discussed Sunscreen use discussed. 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 Interim medical history since our last visit reviewed. Allergies and medications reviewed and updated. Outpatient Medications Prior to Visit  Medication Sig Dispense Refill  . Cholecalciferol (VITAMIN D3) 1.25 MG (50000 UT) CAPS Take by mouth in the morning and at bedtime.    . Magnesium Oxide 500 MG CAPS Take 1 capsule (500 mg total) by mouth daily. 30 capsule 2  . omeprazole (PRILOSEC) 40 MG capsule Take 1 capsule (40 mg total) by mouth daily. Daily for 3 weeks then as needed 30 capsule 1  . oxyCODONE (OXY IR/ROXICODONE) 5 MG immediate release tablet Take 1 tablet (5 mg total) by mouth every 6 (six) hours as needed for severe pain. Must last 30 days 120 tablet 0  . [START ON 01/01/2021] oxyCODONE (OXY IR/ROXICODONE) 5 MG immediate release tablet Take 1 tablet (5 mg total) by mouth every 6 (six) hours as needed for severe pain. Must last 30 days 120 tablet 0  . sildenafil (VIAGRA) 100 MG tablet Take 0.5-1 tablets (50-100 mg total) by mouth daily as needed for erectile dysfunction. 5 tablet 3  . vitamin B-12 (CYANOCOBALAMIN) 1000 MCG tablet Take 1,000 mcg by mouth daily.    Marland Kitchen lisinopril (ZESTRIL) 40 MG  tablet TAKE 1 TABLET BY MOUTH EVERY DAY 90 tablet 0  . oxyCODONE (OXY IR/ROXICODONE) 5 MG immediate release tablet Take 1 tablet (5 mg total) by mouth every 6 (six) hours as needed for severe pain. Must last 30 days 120 tablet 0   No facility-administered medications prior to visit.     Per HPI unless specifically indicated in ROS section below Review of Systems  Constitutional: Negative for activity change, appetite change, chills, fatigue, fever and unexpected weight change.  HENT: Positive for congestion. Negative for hearing loss.   Eyes: Negative for visual disturbance.  Respiratory: Positive for cough. Negative for chest tightness, shortness of breath and  wheezing.   Cardiovascular: Negative for chest pain, palpitations and leg swelling.  Gastrointestinal: Negative for abdominal distention, abdominal pain, blood in stool, constipation, diarrhea, nausea and vomiting.  Genitourinary: Negative for difficulty urinating and hematuria.  Musculoskeletal: Negative for arthralgias, myalgias and neck pain.  Skin: Negative for rash.  Neurological: Negative for dizziness, seizures, syncope and headaches.  Hematological: Negative for adenopathy. Does not bruise/bleed easily.  Psychiatric/Behavioral: Negative for dysphoric mood. The patient is not nervous/anxious.    Objective:  BP 136/86 (BP Location: Left Arm, Patient Position: Sitting, Cuff Size: Large)   Pulse 90   Temp 97.9 F (36.6 C) (Temporal)   Ht 6\' 1"  (1.854 m)   Wt (!) 313 lb 5 oz (142.1 kg)   SpO2 97%   BMI 41.34 kg/m   Wt Readings from Last 3 Encounters:  12/27/20 (!) 313 lb 5 oz (142.1 kg)  10/28/20 250 lb (113.4 kg)  07/31/20 250 lb (113.4 kg)      Physical Exam Vitals and nursing note reviewed.  Constitutional:      General: He is not in acute distress.    Appearance: Normal appearance. He is well-developed and well-nourished. He is obese.  HENT:     Head: Normocephalic and atraumatic.     Right Ear: Hearing, tympanic membrane, ear canal and external ear normal.     Left Ear: Hearing, tympanic membrane, ear canal and external ear normal.     Mouth/Throat:     Mouth: Oropharynx is clear and moist and mucous membranes are normal.     Pharynx: Uvula midline. No posterior oropharyngeal edema.  Eyes:     General: No scleral icterus.    Extraocular Movements: Extraocular movements intact and EOM normal.     Conjunctiva/sclera: Conjunctivae normal.     Pupils: Pupils are equal, round, and reactive to light.  Neck:     Thyroid: No thyroid mass or thyromegaly.  Cardiovascular:     Rate and Rhythm: Normal rate and regular rhythm.     Pulses: Normal pulses and intact distal  pulses.          Radial pulses are 2+ on the right side and 2+ on the left side.     Heart sounds: Normal heart sounds. No murmur heard.   Pulmonary:     Effort: Pulmonary effort is normal. No respiratory distress.     Breath sounds: Normal breath sounds. No wheezing, rhonchi or rales.  Abdominal:     General: Abdomen is flat. Bowel sounds are normal. There is no distension.     Palpations: Abdomen is soft. There is no mass.     Tenderness: There is no abdominal tenderness. There is no guarding or rebound.     Hernia: No hernia is present.  Musculoskeletal:        General: No edema. Normal range  of motion.     Cervical back: Normal range of motion and neck supple.     Right lower leg: No edema.     Left lower leg: No edema.  Lymphadenopathy:     Cervical: No cervical adenopathy.  Skin:    General: Skin is warm and dry.     Findings: No rash.  Neurological:     General: No focal deficit present.     Mental Status: He is alert and oriented to person, place, and time.     Comments: CN grossly intact, station and gait intact  Psychiatric:        Mood and Affect: Mood and affect and mood normal.        Behavior: Behavior normal.        Thought Content: Thought content normal.        Judgment: Judgment normal.       Results for orders placed or performed in visit on 05/01/20  ToxASSU[employer] Select 86 (MW), Urine  Result Value Ref Range   Summary Note    Assessment & Plan:  This visit occurred during the SARS-CoV-2 public health emergency.  Safety protocols were in place, including screening questions prior to the visit, additional usage of staff PPE, and extensive cleaning of exam room while observing appropriate contact time as indicated for disinfecting solutions.   Problem List Items Addressed This Visit    Vitamin D deficiency    Update levels On vit D replacement       Relevant Orders   VITAMIN D 25 Hydroxy (Vit-D Deficiency, Fractures)   Vitamin B12 deficiency    Update  levels on 1042mcg daily.       Relevant Orders   Vitamin B12   Severe obesity (BMI 35.0-35.9 with comorbidity) ([place])    Reviewed significant weight gain noted. Encouraged healthy diet and lifestyle choices to affect sustainable weight loss. He is motivated to restart low carb diet which he previously had success with.       Low magnesium levels   Relevant Orders   Magnesium   HTN (hypertension)    Chronic, stable. Continue current regimen.       Relevant Medications   lisinopril (ZESTRIL) 40 MG tablet   Other Relevant Orders   Lipid panel   Comprehensive metabolic panel   History of type 2 diabetes mellitus    Update A1c with weight gain noted.       Relevant Orders   Hemoglobin A1c   Chronic pain syndrome (Chronic)    Followed by pain clinic.       Advanced care planning/counseling discussion - Primary    Advanced directive - does not have this set up. Would want Danielle fiance to be HCPOA.           Meds ordered this encounter  Medications  . lisinopril (ZESTRIL) 40 MG tablet    Sig: Take 1 tablet (40 mg total) by mouth daily.    Dispense:  90 tablet    Refill:  3   Orders Placed This Encounter  Procedures  . Lipid panel  . Comprehensive metabolic panel  . Hemoglobin A1c  . Vitamin B12  . VITAMIN D 25 Hydroxy (Vit-D Deficiency, Fractures)  . Magnesium    Patient instructions: Consider colon cancer screening options - we will discuss again next year  Consider tetanus shot.  Work on low carb diet for goal weight loss.  Labs today.  Return as needed or in 1 year for next medicare  wellness visit.   Follow up plan: Return if symptoms worsen or fail to improve.  Ria Bush, MD

## 2020-12-27 NOTE — Assessment & Plan Note (Signed)
Update levels on 1000 mcg daily. ?

## 2020-12-27 NOTE — Patient Instructions (Signed)
[name] , Thank you for taking time to come for your Medicare Wellness Visit. I appreciate your ongoing commitment to your health goals. Please review the following plan we discussed and let me know if I can assist you in the future.   Screening recommendations/referrals: Colonoscopy: due at age 45 Recommended yearly ophthalmology/optometry visit for glaucoma screening and checkup Recommended yearly dental visit for hygiene and checkup  Vaccinations: Influenza vaccine: declined Pneumococcal vaccine: due at age 87  Tdap vaccine: decline-insurance  Shingles vaccine: due at age 16    Covid-19: declined  Advanced directives: Advance directive discussed with you today. Even though you declined this today please call our office should you change your mind and we can give you the proper paperwork for you to fill out.   Conditions/risks identified: hypertension  Next appointment: Follow up in one year for your annual wellness visit.   Preventive Care 48-67 Years Old, Male Preventive care refers to lifestyle choices and visits with your health care provider that can promote health and wellness. What does preventive care include?  A yearly physical exam. This is also called an annual well check.  Dental exams once or twice a year.  Routine eye exams. Ask your health care provider how often you should have your eyes checked.  Personal lifestyle choices, including:  Daily care of your teeth and gums.  Regular physical activity.  Eating a healthy diet.  Avoiding tobacco and drug use.  Limiting alcohol use.  Practicing safe sex.  Taking low doses of aspirin every day.  Taking vitamin and mineral supplements as recommended by your health care provider. What happens during an annual well check? The services and screenings done by your health care provider during your annual well check will depend on your age, overall health, lifestyle risk factors, and family history of  disease. Counseling  Your health care provider may ask you questions about your:  Alcohol use.  Tobacco use.  Drug use.  Emotional well-being.  Home and relationship well-being.  Sexual activity.  Eating habits.  History of falls.  Memory and ability to understand (cognition).  Work and work Statistician. Screening  You may have the following tests or measurements:  Height, weight, and BMI.  Blood pressure.  Lipid and cholesterol levels. These may be checked every 5 years, or more frequently if you are over 7 years old.  Skin check.  Lung cancer screening. You may have this screening every year starting at age 43 if you have a 30-pack-year history of smoking and currently smoke or have quit within the past 15 years.  Fecal occult blood test (FOBT) of the stool. You may have this test every year starting at age 80.  Flexible sigmoidoscopy or colonoscopy. You may have a sigmoidoscopy every 5 years or a colonoscopy every 10 years starting at age 34.  Prostate cancer screening. Recommendations will vary depending on your family history and other risks.  Hepatitis C blood test.  Hepatitis B blood test.  Sexually transmitted disease (STD) testing.  Diabetes screening. This is done by checking your blood sugar (glucose) after you have not eaten for a while (fasting). You may have this done every 1-3 years.  Abdominal aortic aneurysm (AAA) screening. You may need this if you are a current or former smoker.  Osteoporosis. You may be screened starting at age 80 if you are at high risk. Talk with your health care provider about your test results, treatment options, and if necessary, the need for more  tests. Vaccines  Your health care provider may recommend certain vaccines, such as:  Influenza vaccine. This is recommended every year.  Tetanus, diphtheria, and acellular pertussis (Tdap, Td) vaccine. You may need a Td booster every 10 years.  Zoster vaccine. You may  need this after age 74.  Pneumococcal 13-valent conjugate (PCV13) vaccine. One dose is recommended after age 50.  Pneumococcal polysaccharide (PPSV23) vaccine. One dose is recommended after age 68. Talk to your health care provider about which screenings and vaccines you need and how often you need them. This information is not intended to replace advice given to you by your health care provider. Make sure you discuss any questions you have with your health care provider. Document Released: 12/27/2015 Document Revised: 08/19/2016 Document Reviewed: 10/01/2015 Elsevier Interactive Patient Education  2017 [place] Prevention in the Home Falls can cause injuries. They can happen to people of all ages. There are many things you can do to make your home safe and to help prevent falls. What can I do on the outside of my home?  Regularly fix the edges of walkways and driveways and fix any cracks.  Remove anything that might make you trip as you walk through a door, such as a raised step or threshold.  Trim any bushes or trees on the path to your home.  Use bright outdoor lighting.  Clear any walking paths of anything that might make someone trip, such as rocks or tools.  Regularly check to see if handrails are loose or broken. Make sure that both sides of any steps have handrails.  Any raised decks and porches should have guardrails on the edges.  Have any leaves, snow, or ice cleared regularly.  Use sand or salt on walking paths during winter.  Clean up any spills in your garage right away. This includes oil or grease spills. What can I do in the bathroom?  Use night lights.  Install grab bars by the toilet and in the tub and shower. Do not use towel bars as grab bars.  Use non-skid mats or decals in the tub or shower.  If you need to sit down in the shower, use a plastic, non-slip stool.  Keep the floor dry. Clean up any water that spills on the floor as soon as it  happens.  Remove soap buildup in the tub or shower regularly.  Attach bath mats securely with double-sided non-slip rug tape.  Do not have throw rugs and other things on the floor that can make you trip. What can I do in the bedroom?  Use night lights.  Make sure that you have a light by your bed that is easy to reach.  Do not use any sheets or blankets that are too big for your bed. They should not hang down onto the floor.  Have a firm chair that has side arms. You can use this for support while you get dressed.  Do not have throw rugs and other things on the floor that can make you trip. What can I do in the kitchen?  Clean up any spills right away.  Avoid walking on wet floors.  Keep items that you use a lot in easy-to-reach places.  If you need to reach something above you, use a strong step stool that has a grab bar.  Keep electrical cords out of the way.  Do not use floor polish or wax that makes floors slippery. If you must use wax, use non-skid floor  wax.  Do not have throw rugs and other things on the floor that can make you trip. What can I do with my stairs?  Do not leave any items on the stairs.  Make sure that there are handrails on both sides of the stairs and use them. Fix handrails that are broken or loose. Make sure that handrails are as long as the stairways.  Check any carpeting to make sure that it is firmly attached to the stairs. Fix any carpet that is loose or worn.  Avoid having throw rugs at the top or bottom of the stairs. If you do have throw rugs, attach them to the floor with carpet tape.  Make sure that you have a light switch at the top of the stairs and the bottom of the stairs. If you do not have them, ask someone to add them for you. What else can I do to help prevent falls?  Wear shoes that:  Do not have high heels.  Have rubber bottoms.  Are comfortable and fit you well.  Are closed at the toe. Do not wear sandals.  If you  use a stepladder:  Make sure that it is fully opened. Do not climb a closed stepladder.  Make sure that both sides of the stepladder are locked into place.  Ask someone to hold it for you, if possible.  Clearly mark and make sure that you can see:  Any grab bars or handrails.  First and last steps.  Where the edge of each step is.  Use tools that help you move around (mobility aids) if they are needed. These include:  Canes.  Walkers.  Scooters.  Crutches.  Turn on the lights when you go into a dark area. Replace any light bulbs as soon as they burn out.  Set up your furniture so you have a clear path. Avoid moving your furniture around.  If any of your floors are uneven, fix them.  If there are any pets around you, be aware of where they are.  Review your medicines with your doctor. Some medicines can make you feel dizzy. This can increase your chance of falling. Ask your doctor what other things that you can do to help prevent falls. This information is not intended to replace advice given to you by your health care provider. Make sure you discuss any questions you have with your health care provider. Document Released: 09/26/2009 Document Revised: 05/07/2016 Document Reviewed: 01/04/2015 Elsevier Interactive Patient Education  2017 [employer].

## 2020-12-27 NOTE — Assessment & Plan Note (Signed)
Advanced directive - does not have this set up. Would want Danielle fiance to be HCPOA.

## 2020-12-27 NOTE — Assessment & Plan Note (Signed)
Chronic, stable. Continue current regimen. 

## 2020-12-27 NOTE — Addendum Note (Signed)
Addended by: Cloyd Stagers on: 12/27/2020 03:20 PM   Modules accepted: Orders

## 2020-12-27 NOTE — Progress Notes (Addendum)
Subjective:   Larry Goodwin is a 45 y.o. male who presents for Medicare Annual/Subsequent preventive examination.  Review of Systems: N/A      I connected with the patient today by telephone and verified that I am speaking with the correct person using two identifiers. Location patient: home Location nurse: work Persons participating in the telephone visit: patient, nurse.   I discussed the limitations, risks, security and privacy concerns of performing an evaluation and management service by telephone and the availability of in person appointments. I also discussed with the patient that there may be a patient responsible charge related to this service. The patient expressed understanding and verbally consented to this telephonic visit.        Cardiac Risk Factors include: advanced age (>73men, >5 women);male gender;hypertension     Objective:    Today's Vitals   12/27/20 1312  PainSc: 7    There is no height or weight on file to calculate BMI.  Advanced Directives 12/27/2020 02/12/2020 07/17/2019 07/12/2018 07/09/2018 05/02/2018 11/09/2017  Does Patient Have a Medical Advance Directive? No No No No No No No  Would patient like information on creating a medical advance directive? No - Patient declined - - No - Patient declined No - Patient declined No - Patient declined -    Current Medications (verified) Outpatient Encounter Medications as of 12/27/2020  Medication Sig  . Cholecalciferol (VITAMIN D3) 1.25 MG (50000 UT) CAPS Take by mouth in the morning and at bedtime.  Marland Kitchen lisinopril (ZESTRIL) 40 MG tablet TAKE 1 TABLET BY MOUTH EVERY DAY  . Magnesium Oxide 500 MG CAPS Take 1 capsule (500 mg total) by mouth daily.  Marland Kitchen omeprazole (PRILOSEC) 40 MG capsule Take 1 capsule (40 mg total) by mouth daily. Daily for 3 weeks then as needed  . oxyCODONE (OXY IR/ROXICODONE) 5 MG immediate release tablet Take 1 tablet (5 mg total) by mouth every 6 (six) hours as needed for severe pain. Must  last 30 days  . [START ON 01/01/2021] oxyCODONE (OXY IR/ROXICODONE) 5 MG immediate release tablet Take 1 tablet (5 mg total) by mouth every 6 (six) hours as needed for severe pain. Must last 30 days  . sildenafil (VIAGRA) 100 MG tablet Take 0.5-1 tablets (50-100 mg total) by mouth daily as needed for erectile dysfunction.  . vitamin B-12 (CYANOCOBALAMIN) 1000 MCG tablet Take 1,000 mcg by mouth daily.  Marland Kitchen oxyCODONE (OXY IR/ROXICODONE) 5 MG immediate release tablet Take 1 tablet (5 mg total) by mouth every 6 (six) hours as needed for severe pain. Must last 30 days   No facility-administered encounter medications on file as of 12/27/2020.    Allergies (verified) Levaquin [levofloxacin in d5w]   History: Past Medical History:  Diagnosis Date  . Bell's palsy 2015   right then left sides  . Chronic pain syndrome    s/p 2 back surgeries [facility])  . DDD (degenerative disc disease), lumbar    severe s/p 2 surgeries  . HTN (hypertension)   . Morbid obesity ([place]) 08/16/2014   Past Surgical History:  Procedure Laterality Date  . BACK SURGERY  2012   x 2 [facility])  . CHOLECYSTECTOMY     Family History  Problem Relation Age of Onset  . COPD Mother        smoker (deceased at 44)  . Hypertension Other   . Stroke Neg Hx   . Cancer Neg Hx   . Diabetes Neg Hx   . CAD Neg Hx  Social History   Socioeconomic History  . Marital status: Single    Spouse name: Not on file  . Number of children: Not on file  . Years of education: Not on file  . Highest education level: Not on file  Occupational History  . Not on file  Tobacco Use  . Smoking status: Former Smoker    Quit date: 12/15/2007    Years since quitting: 13.0  . Smokeless tobacco: Never Used  . Tobacco comment: quit 6 years ago  Vaping Use  . Vaping Use: Never used  Substance and Sexual Activity  . Alcohol use: No    Alcohol/week: 0.0 standard drinks  . Drug use: Yes    Types: Oxycodone  . Sexual activity: Yes  Other Topics  Concern  . 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 Difficulty of Paying Living Expenses: Not hard at all  Food Insecurity: No Food Insecurity  . Worried About Charity fundraiser in the Last Year: Never true  . Ran Out of Food in the Last Year: Never true  Transportation Needs: No Transportation Needs  . Lack of Transportation (Medical): No  . Lack of Transportation (Non-Medical): No  Physical Activity: Inactive  . Days of Exercise per Week: 0 days  . Minutes of Exercise per Session: 0 min  Stress: No Stress Concern Present  . Feeling of Stress : Not at all  Social Connections: Not on file    Tobacco Counseling Counseling given: Not Answered Comment: quit 6 years ago   Clinical Intake:  Pre-visit preparation completed: Yes  Pain : 0-10 Pain Score: 7  Pain Type: Chronic pain Pain Location: Back Pain Descriptors / Indicators: Aching Pain Onset: More than a month ago Pain Frequency: Intermittent     Diabetes: No  How often do you need to have someone help you when you read instructions, pamphlets, or other written materials from your doctor or pharmacy?: 1 - Never What is the last grade level you completed in school?: 12th  Diabetic: No Nutrition Risk Assessment:  Has the patient had any N/V/D within the last 2 months?  No  Does the patient have any non-healing wounds?  No  Has the patient had any unintentional weight loss or weight gain?  No   Diabetes:  Is the patient diabetic?  No  If diabetic, was a CBG obtained today?  N/A Did the patient bring in their glucometer from home?  N/A How often do you monitor your CBG's? N/A.   Financial Strains and Diabetes Management:  Are you having any  financial strains with the device, your supplies or your medication? N/A.  Does the patient want to be seen by Chronic Care Management for management of their diabetes?  N/A Would the patient like to be referred to a Nutritionist or for Diabetic Management?  N/A     Interpreter Needed?: No  Information entered by :: CJohnson, LPN   Activities of Daily Living In your present state of health, do you have any difficulty performing the following activities: 12/27/2020  Hearing? N  Vision? N  Difficulty concentrating or making decisions? N  Walking or climbing stairs? N  Dressing or bathing? N  Doing errands, shopping? N  Preparing Food and eating ? N  Using the Toilet? N  In the past six months, have you accidently leaked urine? N  Do you have problems with loss of bowel control? N  Managing your Medications? N  Managing your Finances? N  Housekeeping or managing your Housekeeping? N  Some recent data might be hidden    Patient Care Team: Ria Bush, MD as PCP - General (Family Medicine)  Indicate any recent Medical Services you may have received from other than [facility] providers in the past year (date may be approximate).     Assessment:   This is a routine wellness examination for Adolf.  Hearing/Vision screen  Hearing Screening   125Hz  250Hz  500Hz  1000Hz  2000Hz  3000Hz  4000Hz  6000Hz  8000Hz   Right ear:           Left ear:           Vision Screening Comments: Advised patient to get annual eye exams. Has not had one in a while.   Dietary issues and exercise activities discussed: Current Exercise Habits: The patient does not participate in regular exercise at present  Goals    . Patient Stated     Starting 07/12/2018, I will continue to take medications as prescribed.     . Patient Stated     12/27/2020, I will maintain and continue medications as prescribed.     . Weight (lb) < 200 lb (90.7 kg)     07/17/2019, wants to live healthier and continue to lose weight       Depression Screen PHQ 2/9 Scores 12/27/2020 07/31/2020 07/17/2019 08/02/2018 07/12/2018 05/02/2018 02/08/2018  PHQ - 2 Score 0 0 0 0 0 0 0  PHQ- 9 Score 0 - 0 - 0 - -    Fall Risk Fall Risk  12/27/2020 10/28/2020 07/31/2020 07/17/2019 02/06/2019  Falls in the past year? 0 0 0 0 0  Number falls in past yr: 0 - - - -  Injury with Fall? 0 - - - -  Risk for fall due to : Medication side effect - - Medication side effect -  Follow up Falls evaluation completed;Falls prevention discussed - - Falls evaluation completed;Falls prevention discussed -    FALL RISK PREVENTION PERTAINING TO THE HOME:  Any stairs in or around the home? Yes  If so, are there any without handrails? No  Home free of loose throw rugs in walkways, pet beds, electrical cords, etc? Yes  Adequate lighting in your home to reduce risk of falls? Yes   ASSISTIVE DEVICES UTILIZED TO PREVENT FALLS:  Life alert? No  Use of a cane, walker or w/c? No  Grab bars in the bathroom? Yes  Shower chair or bench in shower? No  Elevated toilet seat or a handicapped toilet? No   TIMED UP AND GO:  Was the test performed? N/A telephone visit.   Cognitive Function: MMSE - Mini Mental State Exam 12/27/2020 07/17/2019 07/12/2018  Orientation to time 5 5 5   Orientation to Place 5 5 5   Registration 3 3 3   Attention/ Calculation 5 5 0  Recall 3 3 3   Language- name 2 objects - 0 0  Language- repeat 1 1 1   Language- follow 3 step command - 0 3  Language- read & follow direction - 0 0  Write a sentence - 0 0  Copy design - 0 0  Total score - 22 20  Mini Cog  Mini-Cog screen was completed. Maximum score is  22. A value of 0 denotes this part of the MMSE was not completed or the patient failed this part of the Mini-Cog screening.       Immunizations  There is no immunization history on file for this patient.  TDAP status: Due, Education has been provided regarding the importance of this vaccine. Advised may receive this vaccine at local  pharmacy or Health Dept. Aware to provide a copy of the vaccination record if obtained from local pharmacy or Health Dept. Verbalized acceptance and understanding.  Flu Vaccine status: Declined, Education has been provided regarding the importance of this vaccine but patient still declined. Advised may receive this vaccine at local pharmacy or Health Dept. Aware to provide a copy of the vaccination record if obtained from local pharmacy or Health Dept. Verbalized acceptance and understanding.  Pneumococcal vaccine status: N/A, due at age 67  Covid-19 vaccine status: Declined, Education has been provided regarding the importance of this vaccine but patient still declined. Advised may receive this vaccine at local pharmacy or Health Dept.or vaccine clinic. Aware to provide a copy of the vaccination record if obtained from local pharmacy or Health Dept. Verbalized acceptance and understanding.  Qualifies for Shingles Vaccine? No   Zostavax completed N/A, at age 64   Shingrix Completed?: N/A, at age 31  Screening Tests Health Maintenance  Topic Date Due  . Hepatitis C Screening  Never done  . COLONOSCOPY (Pts 45-47yrs Insurance coverage will need to be confirmed)  Never done  . COVID-19 Vaccine (1) 01/12/2021 (Originally [birth year]/1980)  . INFLUENZA VACCINE  03/13/2021 (Originally 07/14/2020)  . TETANUS/TDAP  12/27/2025 (Originally 09/07/1994)  . HIV Screening  07/12/2049 (Originally 09/07/1990)    Health Maintenance  Health Maintenance Due  Topic Date Due  . Hepatitis C Screening  Never done  . COLONOSCOPY (Pts 45-49yrs Insurance coverage will need to be confirmed)  Never done    Colorectal cancer screening: N/A, due at age 70  Lung Cancer Screening: (Low Dose CT Chest recommended if Age 36-80 years, 30 pack-year currently smoking OR have quit w/in 15years.) does not qualify.     Additional Screening:  Hepatitis C Screening: does qualify; Completed due  Vision Screening: Recommended  annual ophthalmology exams for early detection of glaucoma and other disorders of the eye. Is the patient up to date with their annual eye exam?  No , does not have any vision issues.  Who is the provider or what is the name of the office in which the patient attends annual eye exams? Does not have eye doctor If pt is not established with a provider, would they like to be referred to a provider to establish care? No .   Dental Screening: Recommended annual dental exams for proper oral hygiene  Community Resource Referral / Chronic Care Management: CRR required this visit?  No   CCM required this visit?  No      Plan:     I have personally reviewed and noted the following in the patient's chart:   . Medical and social history . Use of alcohol, tobacco or illicit drugs  . Current medications and supplements . Functional ability and status . Nutritional status . Physical activity . Advanced directives . List of other physicians . Hospitalizations, surgeries, and ER visits in previous 12 months . Vitals . Screenings to include cognitive, depression, and falls . Referrals and appointments  In addition, I have reviewed and discussed with patient certain preventive protocols, quality metrics, and best practice recommendations. A  written personalized care plan for preventive services as well as general preventive health recommendations were provided to patient.   Due to this being a telephonic visit, the after visit summary with patients personalized plan was offered to patient via office or my-chart.  Patient preferred to pick up at office at next visit or via mychart.   Andrez Grime, LPN   [phone]

## 2020-12-27 NOTE — Addendum Note (Signed)
Addended by: Cloyd Stagers on: 12/27/2020 03:19 PM   Modules accepted: Orders

## 2020-12-27 NOTE — Progress Notes (Signed)
PCP notes:  Health Maintenance: Flu-declined Tdap- insurance Covid- declined    Abnormal Screenings: none   Patient concerns: none   Nurse concerns: none   Next PCP appt.: 12/27/2020 @ 2:30 pm

## 2020-12-27 NOTE — Assessment & Plan Note (Signed)
Update levels On vit D replacement

## 2020-12-27 NOTE — Assessment & Plan Note (Signed)
Update A1c with weight gain noted.

## 2020-12-27 NOTE — Assessment & Plan Note (Addendum)
Reviewed significant weight gain noted. Encouraged healthy diet and lifestyle choices to affect sustainable weight loss. He is motivated to restart low carb diet which he previously had success with.

## 2020-12-27 NOTE — Assessment & Plan Note (Signed)
Followed by pain clinic.  

## 2020-12-28 ENCOUNTER — Encounter: Payer: Self-pay | Admitting: Family Medicine

## 2020-12-28 LAB — COMPREHENSIVE METABOLIC PANEL
AG Ratio: 1.8 (calc) (ref 1.0–2.5)
ALT: 12 U/L (ref 9–46)
AST: 13 U/L (ref 10–40)
Albumin: 4.3 g/dL (ref 3.6–5.1)
Alkaline phosphatase (APISO): 48 U/L (ref 36–130)
BUN: 13 mg/dL (ref 7–25)
CO2: 28 mmol/L (ref 20–32)
Calcium: 8.8 mg/dL (ref 8.6–10.3)
Chloride: 101 mmol/L (ref 98–110)
Creat: 0.8 mg/dL (ref 0.60–1.35)
Globulin: 2.4 g/dL (calc) (ref 1.9–3.7)
Glucose, Bld: 90 mg/dL (ref 65–99)
Potassium: 4.7 mmol/L (ref 3.5–5.3)
Sodium: 138 mmol/L (ref 135–146)
Total Bilirubin: 0.8 mg/dL (ref 0.2–1.2)
Total Protein: 6.7 g/dL (ref 6.1–8.1)

## 2020-12-28 LAB — HEMOGLOBIN A1C
Hgb A1c MFr Bld: 6.6 % of total Hgb — ABNORMAL HIGH (ref <5.7)
Mean Plasma Glucose: 143 mg/dL
eAG (mmol/L): 7.9 mmol/L

## 2020-12-28 LAB — VITAMIN B12: Vitamin B-12: 676 pg/mL (ref 200–1100)

## 2020-12-28 LAB — LIPID PANEL
Cholesterol: 164 mg/dL (ref <200)
HDL: 47 mg/dL (ref >=40)
LDL Cholesterol (Calc): 102 mg/dL (calc) — ABNORMAL HIGH
Non-HDL Cholesterol (Calc): 117 mg/dL (calc) (ref <130)
Total CHOL/HDL Ratio: 3.5 (calc) (ref <5.0)
Triglycerides: 68 mg/dL (ref <150)

## 2020-12-28 LAB — VITAMIN D 25 HYDROXY (VIT D DEFICIENCY, FRACTURES): Vit D, 25-Hydroxy: 47 ng/mL (ref 30–100)

## 2020-12-28 LAB — MAGNESIUM: Magnesium: 1.8 mg/dL (ref 1.5–2.5)

## 2021-01-28 NOTE — Progress Notes (Signed)
PROVIDER NOTE: Information contained herein reflects review and annotations entered in association with encounter. Interpretation of such information and data should be left to medically-trained personnel. Information provided to patient can be located elsewhere in the medical record under "Patient Instructions". Document created using STT-dictation technology, any transcriptional errors that may result from process are unintentional.    Patient: Larry Goodwin  Service Category: E/M  Provider: Gaspar Cola, MD  DOB: October 29, 1976  DOS: 01/29/2021  Specialty: Interventional Pain Management  MRN: [id number]  Setting: Ambulatory outpatient  PCP: Ria Bush, MD  Type: Established Patient    Referring Provider: Ria Bush, MD  Location: Office  Delivery: Face-to-face     HPI  Larry Goodwin, a 45 y.o. year old male, is here today because of his Chronic pain syndrome [G89.4]. Larry Goodwin's primary complain today is Back Pain Last encounter: My last encounter with him was on 10/28/2020. Pertinent problems: Larry Goodwin has Lumbar DDD (degenerative disc disease); Chronic pain syndrome; Myofascial pain syndrome; Lumbar facet syndrome (Bilateral) (R>L); Chronic upper back pain (Third area of Pain); Lumbar spondylosis with radiculopathy; Epidural fibrosis; Failed back surgical syndrome; Chronic low back pain (Primary Area of Pain) (Bilateral) (R>L); Chronic sacroiliac joint pain (Right); Abnormal MRI, lumbar spine;  Abnormal CT myelogram of the thoracic spine; Thoracic disc herniation; Bulge of lumbar disc without myelopathy; Vertebral body hemangioma; Cervical IVDD (intervertebral disc displacement); Cervical foraminal stenosis (C5-6 and C7-T1: Right); Cervical central spinal stenosis (C5-6); Thoracic IVDD (intervertebral disc displacement); Thoracic facet arthropathy; Thoracic facet syndrome ([place]); Lumbar IVDD (intervertebral disc displacement); Lumbar foraminal stenosis (Right L4-5);  Lumbar central spinal stenosis (L3-4 and L4-5); Chronic knee pain (Right); Osteoarthritis of knee (Right); Chronic hip pain (Secondary area of Pain) (Right); Chronic shoulder pain (Fourth area of Pain) (Bilateral) (Right); and Epigastric pain on their pertinent problem list. Pain Assessment: Severity of Chronic pain is reported as a 3 /10. Location: Back Right/pain radiaites down to his buttoc on the right side. Onset: More than a month ago. Quality: Constant. Timing: Constant. Modifying factor(s): meds and Goodwin down. Vitals:  height is _0  (1.88 m) and weight is 275 lb (124.7 kg). His temperature is 98.2 F (36.8 C). His blood pressure is 146/107 (abnormal) and his pulse is 79. His oxygen saturation is 100%.   Reason for encounter: medication management.   The patient indicates doing well with the current medication regimen. No adverse reactions or side effects reported to the medications.  Since his last visit he seems to be doing well.  He apparently moved, got married, and is expecting twins.  Currently not having any problems with the medications and keeping everything under control.  He has improved significantly since he lost the weight.  RTCB: 05/01/2021  Nonopioids transfer 10/28/2020: Magnesium (OTC)  Pharmacotherapy Assessment   Analgesic: Oxycodone IR 5 mg, 1 tab PO q 6 hrs (20 mg/day of oxycodone) MME/day:30 mg/day.   Monitoring: [place] PMP: PDMP reviewed during this encounter.       Pharmacotherapy: No side-effects or adverse reactions reported. Compliance: No problems identified. Effectiveness: Clinically acceptable.  Chauncey Fischer, RN  01/29/2021 11:07 AM  Sign when Signing Visit Nursing Pain Medication Assessment:  Safety precautions to be maintained throughout the outpatient stay will include: orient to surroundings, keep bed in low position, maintain call bell within reach at all times, provide assistance with transfer out of bed and ambulation.  Medication Inspection  Compliance: Pill count conducted under aseptic conditions, in front of the patient.  Neither the pills nor the bottle was removed from the patient's sight at any time. Once count was completed pills were immediately returned to the patient in their original bottle.  Medication: Oxycodone IR Pill/Patch Count: 7 of 120 pills remain Pill/Patch Appearance: Markings consistent with prescribed medication Bottle Appearance: Standard pharmacy container. Clearly labeled. Filled Date: 1 / 59 / 22 Last Medication intake:  TodaySafety precautions to be maintained throughout the outpatient stay will include: orient to surroundings, keep bed in low position, maintain call bell within reach at all times, provide assistance with transfer out of bed and ambulation.     UDS:  Summary  Date Value Ref Range Status  05/02/2020 Note  Final    Comment:    ==================================================================== [facility] (MW) ==================================================================== Test                             Result       Flag       Units Drug Present and Declared for Prescription Verification   Oxycodone                      1045         EXPECTED   ng/mg creat   Oxymorphone                    508          EXPECTED   ng/mg creat   Noroxycodone                   1029         EXPECTED   ng/mg creat   Noroxymorphone                 176          EXPECTED   ng/mg creat    Sources of oxycodone are scheduled prescription medications.    Oxymorphone, noroxycodone, and noroxymorphone are expected    metabolites of oxycodone. Oxymorphone is also available as a    scheduled prescription medication. ==================================================================== Test                      Result    Flag   Units      Ref Range   Creatinine              38               mg/dL      >=20 ==================================================================== Declared Medications:  The  flagging and interpretation on this report are based on the  following declared medications.  Unexpected results may arise from  inaccuracies in the declared medications.  **Note: The testing scope of this panel includes these medications:  Oxycodone  **Note: The testing scope of this panel does not include the  following reported medications:  Cholecalciferol  Cyclobenzaprine  Lisinopril  Magnesium (Mag-Ox)  Omeprazole  Sildenafil ==================================================================== For clinical consultation, please call [phone]. ====================================================================      ROS  Constitutional: Denies any fever or chills Gastrointestinal: No reported hemesis, hematochezia, vomiting, or acute GI distress Musculoskeletal: Denies any acute onset joint swelling, redness, loss of ROM, or weakness Neurological: No reported episodes of acute onset apraxia, aphasia, dysarthria, agnosia, amnesia, paralysis, loss of coordination, or loss of consciousness  Medication Review  Magnesium Oxide, Vitamin D3, lisinopril, omeprazole, oxyCODONE, sildenafil, and vitamin B-12  History Review  Allergy:  Larry Goodwin is allergic to levaquin [levofloxacin in d5w]. Drug: Larry Goodwin  reports current drug use. Drug: Oxycodone. Alcohol:  reports no history of alcohol use. Tobacco:  reports that he quit smoking about 13 years ago. He has never used smokeless tobacco. Social: Mr. Buth  reports that he quit smoking about 13 years ago. He has never used smokeless tobacco. He reports current drug use. Drug: Oxycodone. He reports that he does not drink alcohol. Medical:  has a past medical history of Bell's palsy (2015), Chronic pain syndrome, DDD (degenerative disc disease), lumbar, HTN (hypertension), and Morbid obesity ([place]) (08/16/2014). Surgical: Mr. Kestler  has a past surgical history that includes Back surgery (2012) and Cholecystectomy. Family: family  history includes COPD in his mother; Hypertension in an other family member.  Laboratory Chemistry Profile   Renal Lab Results  Component Value Date   BUN 13 12/27/2020   CREATININE 0.80 42/70/6237   BCR NOT APPLICABLE 62/83/1517   GFR 97.98 07/19/2019   GFRAA 129 02/06/2019   GFRNONAA 112 02/06/2019     Hepatic Lab Results  Component Value Date   AST 13 12/27/2020   ALT 12 12/27/2020   ALBUMIN 4.3 07/19/2019   ALKPHOS 45 07/19/2019   LIPASE 153 09/21/2012     Electrolytes Lab Results  Component Value Date   NA 138 12/27/2020   K 4.7 12/27/2020   CL 101 12/27/2020   CALCIUM 8.8 12/27/2020   MG 1.8 12/27/2020     Bone Lab Results  Component Value Date   VD25OH 47 12/27/2020   25OHVITD1 35 02/06/2019   25OHVITD2 <1.0 02/06/2019   25OHVITD3 34 02/06/2019     Inflammation (CRP: Acute Phase) (ESR: Chronic Phase) Lab Results  Component Value Date   CRP 9 02/06/2019   ESRSEDRATE 6 02/06/2019       Note: Above Lab results reviewed.  Recent Imaging Review  DG Foot Complete Right CLINICAL DATA:  Jarring foot 3 days prior, no fall  EXAM: RIGHT FOOT COMPLETE - 3+ VIEW  COMPARISON:  Radiograph 03/24/2012  FINDINGS: No acute bony abnormality. Specifically, no fracture, subluxation, or dislocation. Mild degenerative changes are noted in the midfoot. Posterior calcaneal spur is present. Trace ankle effusion. Soft tissues are otherwise unremarkable  IMPRESSION: No acute fracture or traumatic malalignment.  Mild degenerative changes in the midfoot.  Posterior calcaneal spur.  Trace ankle effusion.  Electronically Signed   By: Lovena Le M.D.   On: 02/12/2020 19:03 Note: Reviewed        Physical Exam  General appearance: Well nourished, well developed, and well hydrated. In no apparent acute distress Mental status: Alert, oriented x 3 (person, place, & time)       Respiratory: No evidence of acute respiratory distress Eyes: PERLA Vitals: BP (!)  146/107   Pulse 79   Temp 98.2 F (36.8 C)   Ht $R'6\' 2"'iq$  (1.88 m)   Wt 275 lb (124.7 kg)   SpO2 100%   BMI 35.31 kg/m  BMI: Estimated body mass index is 35.31 kg/m as calculated from the following:   Height as of this encounter: $RemoveBeforeD'6\' 2"'[id number]$  (1.88 m).   Weight as of this encounter: 275 lb (124.7 kg). Ideal: Ideal body weight: 82.2 kg (181 lb 3.5 oz) Adjusted ideal body weight: 99.2 kg (218 lb 11.7 oz)  Assessment   Status Diagnosis  Controlled Controlled Controlled 1. Chronic pain syndrome   2. Chronic low back pain (Primary Area of Pain) (Bilateral) (R>L)   3. Chronic  hip pain (Secondary area of Pain) (Right)   4. Chronic upper back pain (Third area of Pain)   5. Chronic shoulder pain (Fourth area of Pain) (Bilateral) (Right)   6. Pharmacologic therapy   7. Uncomplicated opioid dependence ([place])      Updated Problems: Problem  Uncomplicated Opioid Dependence ([facility])  Erectile Dysfunction    Plan of Care  Problem-specific:  No problem-specific Assessment & Plan notes found for this encounter.  Mr. CRISTIN SZATKOWSKI has a current medication list which includes the following long-term medication(s): vitamin d3, lisinopril, omeprazole, [START ON 01/31/2021] oxycodone, [START ON 03/02/2021] oxycodone, [START ON 04/01/2021] oxycodone, sildenafil, and magnesium oxide.  Pharmacotherapy (Medications Ordered): Meds ordered this encounter  Medications  . oxyCODONE (OXY IR/ROXICODONE) 5 MG immediate release tablet    Sig: Take 1 tablet (5 mg total) by mouth every 6 (six) hours as needed for severe pain. Must last 30 days    Dispense:  120 tablet    Refill:  0    Chronic Pain: STOP Act (Not applicable) Fill 1 day early if closed on refill date. Avoid benzodiazepines within 8 hours of opioids  . oxyCODONE (OXY IR/ROXICODONE) 5 MG immediate release tablet    Sig: Take 1 tablet (5 mg total) by mouth every 6 (six) hours as needed for severe pain. Must last 30 days    Dispense:  120 tablet     Refill:  0    Chronic Pain: STOP Act (Not applicable) Fill 1 day early if closed on refill date. Avoid benzodiazepines within 8 hours of opioids  . oxyCODONE (OXY IR/ROXICODONE) 5 MG immediate release tablet    Sig: Take 1 tablet (5 mg total) by mouth every 6 (six) hours as needed for severe pain. Must last 30 days    Dispense:  120 tablet    Refill:  0    Chronic Pain: STOP Act (Not applicable) Fill 1 day early if closed on refill date. Avoid benzodiazepines within 8 hours of opioids   Orders:  No orders of the defined types were placed in this encounter.  Follow-up plan:   Return in about 3 months (around 05/01/2021) for (F2F), (Med Mgmt).      Interventional treatment options: Planned, scheduled, and/or pending:   Not at this time.   Under consideration:   Diagnostic right CESI  Diagnostic thoracic ESI  Diagnostic bilateral thoracic facet block  Possible bilateral thoracic facet RFA  Diagnostic bilateral lumbar facet block  Possible bilateral lumbar facet RFA  Diagnostic right sacroiliac joint block  Possible right sacroiliac joint RFA  Diagnostic caudal ESI and epidurogram  Diagnostic right-sided L4-5 TFESI  Diagnostic right IA knee injection  Diagnostic right genicular NB  Possible right genicular RFA    Therapeutic/palliative (PRN):   None at this time     Recent Visits No visits were found meeting these conditions. Showing recent visits within past 90 days and meeting all other requirements Today's Visits Date Type Provider Dept  01/29/21 Office Visit Milinda Pointer, MD [facility]  Showing today's visits and meeting all other requirements Future Appointments No visits were found meeting these conditions. Showing future appointments within next 90 days and meeting all other requirements  I discussed the assessment and treatment plan with the patient. The patient was provided an opportunity to ask questions and all were answered. The patient agreed  with the plan and demonstrated an understanding of the instructions.  Patient advised to call back or seek an in-person evaluation if  the symptoms or condition worsens.  Duration of encounter: 30 minutes.  Note by: Gaspar Cola, MD Date: 01/29/2021; Time: 12:22 PM

## 2021-01-29 ENCOUNTER — Encounter: Payer: Self-pay | Admitting: Pain Medicine

## 2021-01-29 ENCOUNTER — Ambulatory Visit: Payer: Medicare Other | Attending: Pain Medicine | Admitting: Pain Medicine

## 2021-01-29 ENCOUNTER — Other Ambulatory Visit: Payer: Self-pay

## 2021-01-29 VITALS — BP 146/107 | HR 79 | Temp 98.2°F | Ht 74.0 in | Wt 275.0 lb

## 2021-01-29 DIAGNOSIS — Z79899 Other long term (current) drug therapy: Secondary | ICD-10-CM | POA: Insufficient documentation

## 2021-01-29 DIAGNOSIS — M545 Low back pain, unspecified: Secondary | ICD-10-CM | POA: Insufficient documentation

## 2021-01-29 DIAGNOSIS — M25512 Pain in left shoulder: Secondary | ICD-10-CM | POA: Diagnosis present

## 2021-01-29 DIAGNOSIS — F1120 Opioid dependence, uncomplicated: Secondary | ICD-10-CM | POA: Diagnosis present

## 2021-01-29 DIAGNOSIS — M25551 Pain in right hip: Secondary | ICD-10-CM | POA: Insufficient documentation

## 2021-01-29 DIAGNOSIS — G894 Chronic pain syndrome: Secondary | ICD-10-CM | POA: Diagnosis not present

## 2021-01-29 DIAGNOSIS — M549 Dorsalgia, unspecified: Secondary | ICD-10-CM | POA: Diagnosis not present

## 2021-01-29 DIAGNOSIS — M25511 Pain in right shoulder: Secondary | ICD-10-CM | POA: Diagnosis present

## 2021-01-29 DIAGNOSIS — G8929 Other chronic pain: Secondary | ICD-10-CM | POA: Diagnosis present

## 2021-01-29 MED ORDER — OXYCODONE HCL 5 MG PO TABS: 5.0000 mg | ORAL_TABLET | Freq: Four times a day (QID) | ORAL | 0 refills | Status: DC | PRN

## 2021-01-29 NOTE — Patient Instructions (Signed)
____________________________________________________________________________________________  Medication Rules  Purpose: To inform patients, and their family members, of our rules and regulations.  Applies to: All patients receiving prescriptions (written or electronic).  Pharmacy of record: Pharmacy where electronic prescriptions will be sent. If written prescriptions are taken to a different pharmacy, please inform the nursing staff. The pharmacy listed in the electronic medical record should be the one where you would like electronic prescriptions to be sent.  Electronic prescriptions: In compliance with the [place] Strengthen Opioid Misuse Prevention (STOP) Act of 2017 (Session Law 2017-74/H243), effective December 14, 2018, all controlled substances must be electronically prescribed. Calling prescriptions to the pharmacy will cease to exist.  Prescription refills: Only during scheduled appointments. Applies to all prescriptions.  NOTE: The following applies primarily to controlled substances (Opioid* Pain Medications).   Type of encounter (visit): For patients receiving controlled substances, face-to-face visits are required. (Not an option or up to the patient.)  Patient's responsibilities: 1. Pain Pills: Bring all pain pills to every appointment (except for procedure appointments). 2. Pill Bottles: Bring pills in original pharmacy bottle. Always bring the newest bottle. Bring bottle, even if empty. 3. Medication refills: You are responsible for knowing and keeping track of what medications you take and those you need refilled. The day before your appointment: write a list of all prescriptions that need to be refilled. The day of the appointment: give the list to the admitting nurse. Prescriptions will be written only during appointments. No prescriptions will be written on procedure days. If you forget a medication: it will not be "Called in", "Faxed", or "electronically sent".  You will need to get another appointment to get these prescribed. No early refills. Do not call asking to have your prescription filled early. 4. Prescription Accuracy: You are responsible for carefully inspecting your prescriptions before leaving our office. Have the discharge nurse carefully go over each prescription with you, before taking them home. Make sure that your name is accurately spelled, that your address is correct. Check the name and dose of your medication to make sure it is accurate. Check the number of pills, and the written instructions to make sure they are clear and accurate. Make sure that you are given enough medication to last until your next medication refill appointment. 5. Taking Medication: Take medication as prescribed. When it comes to controlled substances, taking less pills or less frequently than prescribed is permitted and encouraged. Never take more pills than instructed. Never take medication more frequently than prescribed.  6. Inform other Doctors: Always inform, all of your healthcare providers, of all the medications you take. 7. Pain Medication from other Providers: You are not allowed to accept any additional pain medication from any other Doctor or Healthcare provider. There are two exceptions to this rule. (see below) In the event that you require additional pain medication, you are responsible for notifying us, as stated below. 8. Cough Medicine: Often these contain an opioid, such as codeine or hydrocodone. Never accept or take cough medicine containing these opioids if you are already taking an opioid* medication. The combination may cause respiratory failure and death. 9. Medication Agreement: You are responsible for carefully reading and following our Medication Agreement. This must be signed before receiving any prescriptions from our practice. Safely store a copy of your signed Agreement. Violations to the Agreement will result in no further prescriptions.  (Additional copies of our Medication Agreement are available upon request.) 10. Laws, Rules, & Regulations: All patients are expected to follow all   Federal and State Laws, Statutes, Rules, & Regulations. Ignorance of the Laws does not constitute a valid excuse.  11. Illegal drugs and Controlled Substances: The use of illegal substances (including, but not limited to marijuana and its derivatives) and/or the illegal use of any controlled substances is strictly prohibited. Violation of this rule may result in the immediate and permanent discontinuation of any and all prescriptions being written by our practice. The use of any illegal substances is prohibited. 12. Adopted CDC guidelines & recommendations: Target dosing levels will be at or below 60 MME/day. Use of benzodiazepines** is not recommended.  Exceptions: There are only two exceptions to the rule of not receiving pain medications from other Healthcare Providers. 1. Exception #1 (Emergencies): In the event of an emergency (i.e.: accident requiring emergency care), you are allowed to receive additional pain medication. However, you are responsible for: As soon as you are able, call our office [phone], at any time of the day or night, and leave a message stating your name, the date and nature of the emergency, and the name and dose of the medication prescribed. In the event that your call is answered by a member of our staff, make sure to document and save the date, time, and the name of the person that took your information.  2. Exception #2 (Planned Surgery): In the event that you are scheduled by another doctor or dentist to have any type of surgery or procedure, you are allowed (for a period no longer than 30 days), to receive additional pain medication, for the acute post-op pain. However, in this case, you are responsible for picking up a copy of our "Post-op Pain Management for Surgeons" handout, and giving it to your surgeon or dentist. This  document is available at our office, and does not require an appointment to obtain it. Simply go to our office during business hours (Monday-Thursday from 8:00 AM to 4:00 PM) (Friday 8:00 AM to 12:00 Noon) or if you have a scheduled appointment with us, prior to your surgery, and ask for it by name. In addition, you are responsible for: calling our office [phone], at any time of the day or night, and leaving a message stating your name, name of your surgeon, type of surgery, and date of procedure or surgery. Failure to comply with your responsibilities may result in termination of therapy involving the controlled substances.  *Opioid medications include: morphine, codeine, oxycodone, oxymorphone, hydrocodone, hydromorphone, meperidine, tramadol, tapentadol, buprenorphine, fentanyl, methadone. **Benzodiazepine medications include: diazepam (Valium), alprazolam (Xanax), clonazepam (Klonopine), lorazepam (Ativan), clorazepate (Tranxene), chlordiazepoxide (Librium), estazolam (Prosom), oxazepam (Serax), temazepam (Restoril), triazolam (Halcion) (Last updated: 11/11/2020) ____________________________________________________________________________________________   ____________________________________________________________________________________________  Medication Recommendations and Reminders  Applies to: All patients receiving prescriptions (written and/or electronic).  Medication Rules & Regulations: These rules and regulations exist for your safety and that of others. They are not flexible and neither are we. Dismissing or ignoring them will be considered "non-compliance" with medication therapy, resulting in complete and irreversible termination of such therapy. (See document titled "Medication Rules" for more details.) In all conscience, because of safety reasons, we cannot continue providing a therapy where the patient does not follow instructions.  Pharmacy of record:   Definition:  This is the pharmacy where your electronic prescriptions will be sent.   We do not endorse any particular pharmacy, however, we have experienced problems with [facility] not securing enough medication supply for the community.  We do not restrict you in your choice of pharmacy. However,   once we write for your prescriptions, we will NOT be re-sending more prescriptions to fix restricted supply problems created by your pharmacy, or your insurance.   The pharmacy listed in the electronic medical record should be the one where you want electronic prescriptions to be sent.  If you choose to change pharmacy, simply notify our nursing staff.  Recommendations:  Keep all of your pain medications in a safe place, under lock and key, even if you live alone. We will NOT replace lost, stolen, or damaged medication.  After you fill your prescription, take 1 week's worth of pills and put them away in a safe place. You should keep a separate, properly labeled bottle for this purpose. The remainder should be kept in the original bottle. Use this as your primary supply, until it runs out. Once it's gone, then you know that you have 1 week's worth of medicine, and it is time to come in for a prescription refill. If you do this correctly, it is unlikely that you will ever run out of medicine.  To make sure that the above recommendation works, it is very important that you make sure your medication refill appointments are scheduled at least 1 week before you run out of medicine. To do this in an effective manner, make sure that you do not leave the office without scheduling your next medication management appointment. Always ask the nursing staff to show you in your prescription , when your medication will be running out. Then arrange for the receptionist to get you a return appointment, at least 7 days before you run out of medicine. Do not wait until you have 1 or 2 pills left, to come in. This is very poor planning and  does not take into consideration that we may need to cancel appointments due to bad weather, sickness, or emergencies affecting our staff.  DO NOT ACCEPT A "Partial Fill": If for any reason your pharmacy does not have enough pills/tablets to completely fill or refill your prescription, do not allow for a "partial fill". The law allows the pharmacy to complete that prescription within 72 hours, without requiring a new prescription. If they do not fill the rest of your prescription within those 72 hours, you will need a separate prescription to fill the remaining amount, which we will NOT provide. If the reason for the partial fill is your insurance, you will need to talk to the pharmacist about payment alternatives for the remaining tablets, but again, DO NOT ACCEPT A PARTIAL FILL, unless you can trust your pharmacist to obtain the remainder of the pills within 72 hours.  Prescription refills and/or changes in medication(s):   Prescription refills, and/or changes in dose or medication, will be conducted only during scheduled medication management appointments. (Applies to both, written and electronic prescriptions.)  No refills on procedure days. No medication will be changed or started on procedure days. No changes, adjustments, and/or refills will be conducted on a procedure day. Doing so will interfere with the diagnostic portion of the procedure.  No phone refills. No medications will be "called into the pharmacy".  No Fax refills.  No weekend refills.  No Holliday refills.  No after hours refills.  Remember:  Business hours are:  Monday to Thursday 8:00 AM to 4:00 PM Provider's Schedule: Jozlynn Plaia, MD - Appointments are:  Medication management: Monday and Wednesday 8:00 AM to 4:00 PM Procedure day: Tuesday and Thursday 7:30 AM to 4:00 PM Bilal Lateef, MD - Appointments are:    Medication management: Tuesday and Thursday 8:00 AM to 4:00 PM Procedure day: Monday and Wednesday  7:30 AM to 4:00 PM (Last update: 07/03/2020) ____________________________________________________________________________________________   ____________________________________________________________________________________________  CBD (cannabidiol) WARNING  Applicable to: All individuals currently taking or considering taking CBD (cannabidiol) and, more important, all patients taking opioid analgesic controlled substances (pain medication). (Example: oxycodone; oxymorphone; hydrocodone; hydromorphone; morphine; methadone; tramadol; tapentadol; fentanyl; buprenorphine; butorphanol; dextromethorphan; meperidine; codeine; etc.)  Legal status: CBD remains a Schedule I drug prohibited for any use. CBD is illegal with one exception. In the [place], CBD has a limited Food and Drug Administration (FDA) approval for the treatment of two specific types of epilepsy disorders. Only one CBD product has been approved by the FDA for this purpose: "Epidiolex". FDA is aware that some companies are marketing products containing cannabis and cannabis-derived compounds in ways that violate the Federal Food, Drug and Cosmetic Act (FD&C Act) and that may put the health and safety of consumers at risk. The FDA, a Federal agency, has not enforced the CBD status since 2018.   Legality: Some manufacturers ship CBD products nationally, which is illegal. Often such products are sold online and are therefore available throughout the country. CBD is openly sold in head shops and health food stores in some states where such sales have not been explicitly legalized. Selling unapproved products with unsubstantiated therapeutic claims is not only a violation of the law, but also can put patients at risk, as these products have not been proven to be safe or effective. Federal illegality makes it difficult to conduct research on CBD.  Reference: "FDA Regulation of Cannabis and Cannabis-Derived Products, Including Cannabidiol  (CBD)" - https://www.fda.gov/news-events/public-health-focus/fda-regulation-cannabis-and-cannabis-derived-products-including-cannabidiol-cbd  Warning: CBD is not FDA approved and has not undergo the same manufacturing controls as prescription drugs.  This means that the purity and safety of available CBD may be questionable. Most of the time, despite manufacturer's claims, it is contaminated with THC (delta-9-tetrahydrocannabinol - the chemical in marijuana responsible for the "HIGH").  When this is the case, the THC contaminant will trigger a positive urine drug screen (UDS) test for Marijuana (carboxy-THC). Because a positive UDS for any illicit substance is a violation of our medication agreement, your opioid analgesics (pain medicine) may be permanently discontinued.  MORE ABOUT CBD  General Information: CBD  is a derivative of the Marijuana (cannabis sativa) plant discovered in 1940. It is one of the 113 identified substances found in Marijuana. It accounts for up to 40% of the plant's extract. As of 2018, preliminary clinical studies on CBD included research for the treatment of anxiety, movement disorders, and pain. CBD is available and consumed in multiple forms, including inhalation of smoke or vapor, as an aerosol spray, and by mouth. It may be supplied as an oil containing CBD, capsules, dried cannabis, or as a liquid solution. CBD is thought not to be as psychoactive as THC (delta-9-tetrahydrocannabinol - the chemical in marijuana responsible for the "HIGH"). Studies suggest that CBD may interact with different biological target receptors in the body, including cannabinoid and other neurotransmitter receptors. As of 2018 the mechanism of action for its biological effects has not been determined.  Side-effects  Adverse reactions: Dry mouth, diarrhea, decreased appetite, fatigue, drowsiness, malaise, weakness, sleep disturbances, and others.  Drug interactions: CBC may interact with other  medications such as blood-thinners. (Last update: 07/20/2020) ____________________________________________________________________________________________   ____________________________________________________________________________________________  Drug Holidays (Slow)  What is a "Drug Holiday"? Drug Holiday: is the name given to the period of time during   which a patient stops taking a medication(s) for the purpose of eliminating tolerance to the drug.  Benefits . Improved effectiveness of opioids. . Decreased opioid dose needed to achieve benefits. . Improved pain with lesser dose.  What is tolerance? Tolerance: is the progressive decreased in effectiveness of a drug due to its repetitive use. With repetitive use, the body gets use to the medication and as a consequence, it loses its effectiveness. This is a common problem seen with opioid pain medications. As a result, a larger dose of the drug is needed to achieve the same effect that used to be obtained with a smaller dose.  How long should a "Drug Holiday" last? You should stay off of the pain medicine for at least 14 consecutive days. (2 weeks)  Should I stop the medicine "cold turkey"? No. You should always coordinate with your Pain Specialist so that he/she can provide you with the correct medication dose to make the transition as smoothly as possible.  How do I stop the medicine? Slowly. You will be instructed to decrease the daily amount of pills that you take by one (1) pill every seven (7) days. This is called a "slow downward taper" of your dose. For example: if you normally take four (4) pills per day, you will be asked to drop this dose to three (3) pills per day for seven (7) days, then to two (2) pills per day for seven (7) days, then to one (1) per day for seven (7) days, and at the end of those last seven (7) days, this is when the "Drug Holiday" would start.   Will I have withdrawals? By doing a "slow downward  taper" like this one, it is unlikely that you will experience any significant withdrawal symptoms. Typically, what triggers withdrawals is the sudden stop of a high dose opioid therapy. Withdrawals can usually be avoided by slowly decreasing the dose over a prolonged period of time. If you do not follow these instructions and decide to stop your medication abruptly, withdrawals may be possible.  What are withdrawals? Withdrawals: refers to the wide range of symptoms that occur after stopping or dramatically reducing opiate drugs after heavy and prolonged use. Withdrawal symptoms do not occur to patients that use low dose opioids, or those who take the medication sporadically. Contrary to benzodiazepine (example: Valium, Xanax, etc.) or alcohol withdrawals ("Delirium Tremens"), opioid withdrawals are not lethal. Withdrawals are the physical manifestation of the body getting rid of the excess receptors.  Expected Symptoms Early symptoms of withdrawal may include: . Agitation . Anxiety . Muscle aches . Increased tearing . Insomnia . Runny nose . Sweating . Yawning  Late symptoms of withdrawal may include: . Abdominal cramping . Diarrhea . Dilated pupils . Goose bumps . Nausea . Vomiting  Will I experience withdrawals? Due to the slow nature of the taper, it is very unlikely that you will experience any.  What is a slow taper? Taper: refers to the gradual decrease in dose.  (Last update: 07/03/2020) ____________________________________________________________________________________________     

## 2021-01-29 NOTE — Progress Notes (Signed)
Nursing Pain Medication Assessment:  Safety precautions to be maintained throughout the outpatient stay will include: orient to surroundings, keep bed in low position, maintain call bell within reach at all times, provide assistance with transfer out of bed and ambulation.  Medication Inspection Compliance: Pill count conducted under aseptic conditions, in front of the patient. Neither the pills nor the bottle was removed from the patient's sight at any time. Once count was completed pills were immediately returned to the patient in their original bottle.  Medication: Oxycodone IR Pill/Patch Count: 7 of 120 pills remain Pill/Patch Appearance: Markings consistent with prescribed medication Bottle Appearance: Standard pharmacy container. Clearly labeled. Filled Date: 1 / 46 / 22 Last Medication intake:  TodaySafety precautions to be maintained throughout the outpatient stay will include: orient to surroundings, keep bed in low position, maintain call bell within reach at all times, provide assistance with transfer out of bed and ambulation.

## 2021-04-29 DIAGNOSIS — Z79891 Long term (current) use of opiate analgesic: Secondary | ICD-10-CM | POA: Insufficient documentation

## 2021-04-29 NOTE — Progress Notes (Signed)
PROVIDER NOTE: Information contained herein reflects review and annotations entered in association with encounter. Interpretation of such information and data should be left to medically-trained personnel. Information provided to patient can be located elsewhere in the medical record under "Patient Instructions". Document created using STT-dictation technology, any transcriptional errors that may result from process are unintentional.    Patient: Larry Goodwin  Service Category: E/M  Provider: Gaspar Cola, MD  DOB: [birth year]  DOS: 04/30/2021  Specialty: Interventional Pain Management  MRN: [id number]  Setting: Ambulatory outpatient  PCP: Larry Bush, MD  Type: Established Patient    Referring Provider: Ria Bush, MD  Location: Office  Delivery: Face-to-face     HPI  [name], a 45 y.o. year old male, is here today because of his Chronic pain syndrome [G89.4]. [name]'s primary complain today is Back Pain (low) Last encounter: My last encounter with him was on 01/29/2021. Pertinent problems: Mr. 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 Pain Assessment: Severity of Chronic pain is reported as a 4 /10. Location: Back Lower/right leg at lower buttocks. Onset: More than a month ago. Quality: Aching,Constant,Burning. Timing: Constant. Modifying factor(s): medications, rest, stretching, heat. Vitals:  height is 6' 2" (1.88 m) and weight is 323 lb 3.2 oz (146.6 kg) (abnormal). His temporal temperature is 98.4 F (36.9 C). His blood pressure is 147/106 (abnormal) and his pulse is 84. His respiration is 18 and oxygen saturation is 98%.   Reason for encounter: medication management.   The patient indicates doing well with the current medication regimen. No adverse reactions or side effects reported to the medications.   RTCB: 07/30/2021 Nonopioids transfer 10/28/2020: Magnesium (OTC)  Pharmacotherapy Assessment   Analgesic: Oxycodone IR 5 mg, 1 tab PO q 6 hrs (20 mg/day of oxycodone) MME/day:30 mg/day.   Monitoring: [place] PMP: PDMP reviewed during this encounter.       Pharmacotherapy: No side-effects or adverse reactions reported. Compliance: No problems identified. Effectiveness: Clinically acceptable.  Larry Rochester, RN  04/30/2021 10:41 AM  Signed Nursing Pain Medication Assessment:  Safety precautions to be maintained throughout the outpatient stay will include: orient to surroundings, keep bed in low position, maintain call bell within reach at all times, provide assistance with transfer out of bed and ambulation.  Medication Inspection Compliance: Pill count conducted under aseptic conditions, in front of the patient. Neither the pills nor the bottle was removed from the patient's sight at any time. Once count was completed pills were immediately returned to the  patient in their original bottle.  Medication: Oxycodone IR Pill/Patch Count: 3 of 120 pills remain Pill/Patch Appearance: Markings  consistent with prescribed medication Bottle Appearance: Standard pharmacy container. Clearly labeled. Filled Date: 04 / 19 / 2022 Last Medication intake:  Today    UDS:  Summary  Date Value Ref Range Status  05/02/2020 Note  Final    Comment:    ==================================================================== [employer] Select 13 (MW) ==================================================================== Test                             Result       Flag       Units Drug Present and Declared for Prescription Verification   Oxycodone                      1045         EXPECTED   ng/mg creat   Oxymorphone                    508          EXPECTED   ng/mg creat   Noroxycodone                   1029         EXPECTED   ng/mg creat   Noroxymorphone                 176          EXPECTED   ng/mg creat    Sources of oxycodone are scheduled prescription medications.    Oxymorphone, noroxycodone, and noroxymorphone are expected    metabolites of oxycodone. Oxymorphone is also available as a    scheduled prescription medication. ==================================================================== Test                      Result    Flag   Units      Ref Range   Creatinine              38               mg/dL      >=20 ==================================================================== Declared Medications:  The flagging and interpretation on this report are based on the  following declared medications.  Unexpected results may arise from  inaccuracies in the declared medications.  **Note: The testing scope of this panel includes these medications:  Oxycodone  **Note: The testing scope of this panel does not include the  following reported medications:  Cholecalciferol  Cyclobenzaprine  Lisinopril  Magnesium (Mag-Ox)  Omeprazole   Sildenafil ==================================================================== For clinical consultation, please call [phone]. ====================================================================      ROS  Constitutional: Denies any fever or chills Gastrointestinal: No reported hemesis, hematochezia, vomiting, or acute GI distress Musculoskeletal: Denies any acute onset joint swelling, redness, loss of ROM, or weakness Neurological: No reported episodes of acute onset apraxia, aphasia, dysarthria, agnosia, amnesia, paralysis, loss of coordination, or loss of consciousness  Medication Review  Magnesium Oxide, Vitamin D3, lisinopril, omeprazole, oxyCODONE, sildenafil, and vitamin B-12  History Review  Allergy: Mr. Gintz is allergic to levaquin [levofloxacin in d5w]. Drug: Mr. Romer  reports current drug use. Drug: Oxycodone. Alcohol:  reports no history of alcohol use. Tobacco:  reports that he quit smoking about 13 years ago. He has never used smokeless tobacco. Social: Mr. Spratlin  reports that he quit smoking about 13 years ago. He has never used smokeless tobacco. He reports current drug use. Drug: Oxycodone. He reports that he does not drink alcohol. Medical:  has a past medical history of Bell's palsy (2015), Chronic pain syndrome, DDD (degenerative disc disease), lumbar, HTN (hypertension), and Morbid obesity ([place]) (08/16/2014). Surgical: Mr. Vreeland  has a past surgical history that includes Back surgery (2012) and Cholecystectomy. Family: family history includes COPD in his mother; Hypertension in an other family member.  Laboratory Chemistry Profile   Renal Lab Results  Component Value Date   BUN 13 12/27/2020   CREATININE 0.80 03/50/0938   BCR NOT APPLICABLE 18/29/9371   GFR 97.98 07/19/2019   GFRAA 129 02/06/2019   GFRNONAA 112 02/06/2019     Hepatic Lab Results  Component Value Date   AST 13 12/27/2020   ALT 12 12/27/2020   ALBUMIN 4.3 07/19/2019    ALKPHOS 45 07/19/2019   LIPASE 153 09/21/2012     Electrolytes Lab Results  Component Value Date   NA 138 12/27/2020   K 4.7 12/27/2020   CL 101 12/27/2020   CALCIUM 8.8 12/27/2020   MG 1.8 12/27/2020     Bone Lab Results  Component Value Date   VD25OH 47 12/27/2020   25OHVITD1 35 02/06/2019   25OHVITD2 <1.0 02/06/2019   25OHVITD3 34 02/06/2019     Inflammation (CRP: Acute Phase) (ESR: Chronic Phase) Lab Results  Component Value Date   CRP 9 02/06/2019   ESRSEDRATE 6 02/06/2019       Note: Above Lab results reviewed.  Recent Imaging Review  DG Foot Complete Right CLINICAL DATA:  Jarring foot 3 days prior, no fall  EXAM: RIGHT FOOT COMPLETE - 3+ VIEW  COMPARISON:  Radiograph 03/24/2012  FINDINGS: No acute bony abnormality. Specifically, no fracture, subluxation, or dislocation. Mild degenerative changes are noted in the midfoot. Posterior calcaneal spur is present. Trace ankle effusion. Soft tissues are otherwise unremarkable  IMPRESSION: No acute fracture or traumatic malalignment.  Mild degenerative changes in the midfoot.  Posterior calcaneal spur.  Trace ankle effusion.  Electronically Signed   By: Lovena Le M.D.   On: 02/12/2020 19:03 Note: Reviewed        Physical Exam  General appearance: Well nourished, well developed, and well hydrated. In no apparent acute distress Mental status: Alert, oriented x 3 (person, place, & time)       Respiratory: No evidence of acute respiratory distress Eyes: PERLA Vitals: BP (!) 147/106   Pulse 84   Temp 98.4 F (36.9 C) (Temporal)   Resp 18   Ht 6' 2" (1.88 m)   Wt (!) 323 lb 3.2 oz (146.6 kg)   SpO2 98%   BMI 41.50 kg/m  BMI: Estimated body mass index is 41.5 kg/m as calculated from the following:   Height as of this encounter: 6' 2" (1.88 m).   Weight as of this encounter: 323 lb 3.2 oz (146.6 kg). Ideal: Ideal body weight: 82.2 kg (181 lb 3.5 oz) Adjusted ideal body weight: 108 kg (238 lb  0.2 oz)  Assessment   Status Diagnosis  Controlled Controlled Controlled 1. Chronic pain syndrome   2. Chronic hip pain (2ry area of Pain) (Right)   3. Chronic upper back pain (3ry area of Pain)   4. Chronic shoulder pain (4th area of Pain) (Bilateral) (Right)   5. Failed back surgical syndrome   6. Pharmacologic therapy   7. Chronic use of opiate for therapeutic purpose   8. Uncomplicated opioid dependence ([place])      Updated Problems: No problems updated.  Plan of Care  Problem-specific:  No problem-specific Assessment & Plan notes found  for this encounter.  Mr. KIMARION CHERY has a current medication list which includes the following long-term medication(s): vitamin d3, lisinopril, magnesium oxide, omeprazole, [START ON 05/01/2021] oxycodone, [START ON 05/31/2021] oxycodone, [START ON 06/30/2021] oxycodone, and sildenafil.  Pharmacotherapy (Medications Ordered): Meds ordered this encounter  Medications  . oxyCODONE (OXY IR/ROXICODONE) 5 MG immediate release tablet    Sig: Take 1 tablet (5 mg total) by mouth every 6 (six) hours as needed for severe pain. Must last 30 days    Dispense:  120 tablet    Refill:  0    Not a duplicate. Do NOT delete! Dispense 1 day early if closed on refill date. Avoid benzodiazepines within 8 hours of opioids. Do not send refill requests.  Marland Kitchen oxyCODONE (OXY IR/ROXICODONE) 5 MG immediate release tablet    Sig: Take 1 tablet (5 mg total) by mouth every 6 (six) hours as needed for severe pain. Must last 30 days    Dispense:  120 tablet    Refill:  0    Not a duplicate. Do NOT delete! Dispense 1 day early if closed on refill date. Avoid benzodiazepines within 8 hours of opioids. Do not send refill requests.  Marland Kitchen oxyCODONE (OXY IR/ROXICODONE) 5 MG immediate release tablet    Sig: Take 1 tablet (5 mg total) by mouth every 6 (six) hours as needed for severe pain. Must last 30 days    Dispense:  120 tablet    Refill:  0    Not a duplicate. Do NOT delete!  Dispense 1 day early if closed on refill date. Avoid benzodiazepines within 8 hours of opioids. Do not send refill requests.   Orders:  Orders Placed This Encounter  Procedures  . [employer] Select 13 (MW), Urine    Volume: 30 ml(s). Minimum 3 ml of urine is needed. Document temperature of fresh sample. Indications: Long term (current) use of opiate analgesic (T15.726)    Order Specific Question:   Release to patient    Answer:   Immediate   Follow-up plan:   Return in about 13 weeks (around 07/30/2021) for evaluation day (F2F) (MM).      Interventional treatment options: Planned, scheduled, and/or pending:   Not at this time.   Under consideration:   Diagnostic right CESI  Diagnostic thoracic ESI  Diagnostic bilateral thoracic facet block  Possible bilateral thoracic facet RFA  Diagnostic bilateral lumbar facet block  Possible bilateral lumbar facet RFA  Diagnostic right sacroiliac joint block  Possible right sacroiliac joint RFA  Diagnostic caudal ESI and epidurogram  Diagnostic right-sided L4-5 TFESI  Diagnostic right IA knee injection  Diagnostic right genicular NB  Possible right genicular RFA    Therapeutic/palliative (PRN):   None at this time      Recent Visits No visits were found meeting these conditions. Showing recent visits within past 90 days and meeting all other requirements Today's Visits Date Type Provider Dept  04/30/21 Office Visit Milinda Pointer, MD [facility]  Showing today's visits and meeting all other requirements Future Appointments No visits were found meeting these conditions. Showing future appointments within next 90 days and meeting all other requirements  I discussed the assessment and treatment plan with the patient. The patient was provided an opportunity to ask questions and all were answered. The patient agreed with the plan and demonstrated an understanding of the instructions.  Patient advised to call back or seek an  in-person evaluation if the symptoms or condition worsens.  Duration of encounter:  30 minutes.  Note by: Larry Cola, MD Date: 04/30/2021; Time: 11:24 AM

## 2021-04-30 ENCOUNTER — Ambulatory Visit: Payer: Medicare Other | Attending: Pain Medicine | Admitting: Pain Medicine

## 2021-04-30 ENCOUNTER — Encounter: Payer: Self-pay | Admitting: Pain Medicine

## 2021-04-30 ENCOUNTER — Other Ambulatory Visit: Payer: Self-pay

## 2021-04-30 VITALS — BP 147/106 | HR 84 | Temp 98.4°F | Resp 18 | Ht 74.0 in | Wt 323.2 lb

## 2021-04-30 DIAGNOSIS — M549 Dorsalgia, unspecified: Secondary | ICD-10-CM | POA: Diagnosis not present

## 2021-04-30 DIAGNOSIS — M25511 Pain in right shoulder: Secondary | ICD-10-CM | POA: Insufficient documentation

## 2021-04-30 DIAGNOSIS — G8929 Other chronic pain: Secondary | ICD-10-CM | POA: Diagnosis present

## 2021-04-30 DIAGNOSIS — Z79891 Long term (current) use of opiate analgesic: Secondary | ICD-10-CM | POA: Diagnosis present

## 2021-04-30 DIAGNOSIS — Z79899 Other long term (current) drug therapy: Secondary | ICD-10-CM | POA: Diagnosis present

## 2021-04-30 DIAGNOSIS — M25551 Pain in right hip: Secondary | ICD-10-CM | POA: Diagnosis not present

## 2021-04-30 DIAGNOSIS — F1120 Opioid dependence, uncomplicated: Secondary | ICD-10-CM

## 2021-04-30 DIAGNOSIS — M961 Postlaminectomy syndrome, not elsewhere classified: Secondary | ICD-10-CM | POA: Diagnosis present

## 2021-04-30 DIAGNOSIS — G894 Chronic pain syndrome: Secondary | ICD-10-CM

## 2021-04-30 DIAGNOSIS — M25512 Pain in left shoulder: Secondary | ICD-10-CM | POA: Diagnosis present

## 2021-04-30 MED ORDER — OXYCODONE HCL 5 MG PO TABS: 5.0000 mg | ORAL_TABLET | Freq: Four times a day (QID) | ORAL | 0 refills | Status: DC | PRN

## 2021-04-30 MED ORDER — OXYCODONE HCL 5 MG PO TABS
5.0000 mg | ORAL_TABLET | Freq: Four times a day (QID) | ORAL | 0 refills | Status: DC | PRN
Start: 2021-06-30 — End: 2021-07-30

## 2021-04-30 MED ORDER — OXYCODONE HCL 5 MG PO TABS
5.0000 mg | ORAL_TABLET | Freq: Four times a day (QID) | ORAL | 0 refills | Status: DC | PRN
Start: 2021-05-01 — End: 2021-07-30

## 2021-04-30 NOTE — Progress Notes (Signed)
Nursing Pain Medication Assessment:  Safety precautions to be maintained throughout the outpatient stay will include: orient to surroundings, keep bed in low position, maintain call bell within reach at all times, provide assistance with transfer out of bed and ambulation.  Medication Inspection Compliance: Pill count conducted under aseptic conditions, in front of the patient. Neither the pills nor the bottle was removed from the patient's sight at any time. Once count was completed pills were immediately returned to the patient in their original bottle.  Medication: Oxycodone IR Pill/Patch Count: 3 of 120 pills remain Pill/Patch Appearance: Markings consistent with prescribed medication Bottle Appearance: Standard pharmacy container. Clearly labeled. Filled Date: 04 / 19 / 2022 Last Medication intake:  Today

## 2021-04-30 NOTE — Patient Instructions (Signed)
____________________________________________________________________________________________  Drug Holidays (Slow)  What is a "Drug Holiday"? Drug Holiday: is the name given to the period of time during which a patient stops taking a medication(s) for the purpose of eliminating tolerance to the drug.  Benefits . Improved effectiveness of opioids. . Decreased opioid dose needed to achieve benefits. . Improved pain with lesser dose.  What is tolerance? Tolerance: is the progressive decreased in effectiveness of a drug due to its repetitive use. With repetitive use, the body gets use to the medication and as a consequence, it loses its effectiveness. This is a common problem seen with opioid pain medications. As a result, a larger dose of the drug is needed to achieve the same effect that used to be obtained with a smaller dose.  How long should a "Drug Holiday" last? You should stay off of the pain medicine for at least 14 consecutive days. (2 weeks)  Should I stop the medicine "cold turkey"? No. You should always coordinate with your Pain Specialist so that he/she can provide you with the correct medication dose to make the transition as smoothly as possible.  How do I stop the medicine? Slowly. You will be instructed to decrease the daily amount of pills that you take by one (1) pill every seven (7) days. This is called a "slow downward taper" of your dose. For example: if you normally take four (4) pills per day, you will be asked to drop this dose to three (3) pills per day for seven (7) days, then to two (2) pills per day for seven (7) days, then to one (1) per day for seven (7) days, and at the end of those last seven (7) days, this is when the "Drug Holiday" would start.   Will I have withdrawals? By doing a "slow downward taper" like this one, it is unlikely that you will experience any significant withdrawal symptoms. Typically, what triggers withdrawals is the sudden stop of a high  dose opioid therapy. Withdrawals can usually be avoided by slowly decreasing the dose over a prolonged period of time. If you do not follow these instructions and decide to stop your medication abruptly, withdrawals may be possible.  What are withdrawals? Withdrawals: refers to the wide range of symptoms that occur after stopping or dramatically reducing opiate drugs after heavy and prolonged use. Withdrawal symptoms do not occur to patients that use low dose opioids, or those who take the medication sporadically. Contrary to benzodiazepine (example: Valium, Xanax, etc.) or alcohol withdrawals ("Delirium Tremens"), opioid withdrawals are not lethal. Withdrawals are the physical manifestation of the body getting rid of the excess receptors.  Expected Symptoms Early symptoms of withdrawal may include: . Agitation . Anxiety . Muscle aches . Increased tearing . Insomnia . Runny nose . Sweating . Yawning  Late symptoms of withdrawal may include: . Abdominal cramping . Diarrhea . Dilated pupils . Goose bumps . Nausea . Vomiting  Will I experience withdrawals? Due to the slow nature of the taper, it is very unlikely that you will experience any.  What is a slow taper? Taper: refers to the gradual decrease in dose.  (Last update: 07/03/2020) ____________________________________________________________________________________________    ____________________________________________________________________________________________  Medication Recommendations and Reminders  Applies to: All patients receiving prescriptions (written and/or electronic).  Medication Rules & Regulations: These rules and regulations exist for your safety and that of others. They are not flexible and neither are we. Dismissing or ignoring them will be considered "non-compliance" with medication therapy, resulting in complete   and irreversible termination of such therapy. (See document titled "Medication Rules" for  more details.) In all conscience, because of safety reasons, we cannot continue providing a therapy where the patient does not follow instructions.  Pharmacy of record:   Definition: This is the pharmacy where your electronic prescriptions will be sent.   We do not endorse any particular pharmacy, however, we have experienced problems with [facility] not securing enough medication supply for the community.  We do not restrict you in your choice of pharmacy. However, once we write for your prescriptions, we will NOT be re-sending more prescriptions to fix restricted supply problems created by your pharmacy, or your insurance.   The pharmacy listed in the electronic medical record should be the one where you want electronic prescriptions to be sent.  If you choose to change pharmacy, simply notify our nursing staff.  Recommendations:  Keep all of your pain medications in a safe place, under lock and key, even if you live alone. We will NOT replace lost, stolen, or damaged medication.  After you fill your prescription, take 1 week's worth of pills and put them away in a safe place. You should keep a separate, properly labeled bottle for this purpose. The remainder should be kept in the original bottle. Use this as your primary supply, until it runs out. Once it's gone, then you know that you have 1 week's worth of medicine, and it is time to come in for a prescription refill. If you do this correctly, it is unlikely that you will ever run out of medicine.  To make sure that the above recommendation works, it is very important that you make sure your medication refill appointments are scheduled at least 1 week before you run out of medicine. To do this in an effective manner, make sure that you do not leave the office without scheduling your next medication management appointment. Always ask the nursing staff to show you in your prescription , when your medication will be running out. Then arrange for  the receptionist to get you a return appointment, at least 7 days before you run out of medicine. Do not wait until you have 1 or 2 pills left, to come in. This is very poor planning and does not take into consideration that we may need to cancel appointments due to bad weather, sickness, or emergencies affecting our staff.  DO NOT ACCEPT A "Partial Fill": If for any reason your pharmacy does not have enough pills/tablets to completely fill or refill your prescription, do not allow for a "partial fill". The law allows the pharmacy to complete that prescription within 72 hours, without requiring a new prescription. If they do not fill the rest of your prescription within those 72 hours, you will need a separate prescription to fill the remaining amount, which we will NOT provide. If the reason for the partial fill is your insurance, you will need to talk to the pharmacist about payment alternatives for the remaining tablets, but again, DO NOT ACCEPT A PARTIAL FILL, unless you can trust your pharmacist to obtain the remainder of the pills within 72 hours.  Prescription refills and/or changes in medication(s):   Prescription refills, and/or changes in dose or medication, will be conducted only during scheduled medication management appointments. (Applies to both, written and electronic prescriptions.)  No refills on procedure days. No medication will be changed or started on procedure days. No changes, adjustments, and/or refills will be conducted on a procedure day. Doing so   will interfere with the diagnostic portion of the procedure.  No phone refills. No medications will be "called into the pharmacy".  No Fax refills.  No weekend refills.  No Holliday refills.  No after hours refills.  Remember:  Business hours are:  Monday to Thursday 8:00 AM to 4:00 PM Provider's Schedule: Baneza Bartoszek, MD - Appointments are:  Medication management: Monday and Wednesday 8:00 AM to 4:00 PM Procedure  day: Tuesday and Thursday 7:30 AM to 4:00 PM Bilal Lateef, MD - Appointments are:  Medication management: Tuesday and Thursday 8:00 AM to 4:00 PM Procedure day: Monday and Wednesday 7:30 AM to 4:00 PM (Last update: 07/03/2020) ____________________________________________________________________________________________   ____________________________________________________________________________________________  CBD (cannabidiol) WARNING  Applicable to: All individuals currently taking or considering taking CBD (cannabidiol) and, more important, all patients taking opioid analgesic controlled substances (pain medication). (Example: oxycodone; oxymorphone; hydrocodone; hydromorphone; morphine; methadone; tramadol; tapentadol; fentanyl; buprenorphine; butorphanol; dextromethorphan; meperidine; codeine; etc.)  Legal status: CBD remains a Schedule I drug prohibited for any use. CBD is illegal with one exception. In the [place], CBD has a limited Food and Drug Administration (FDA) approval for the treatment of two specific types of epilepsy disorders. Only one CBD product has been approved by the FDA for this purpose: "Epidiolex". FDA is aware that some companies are marketing products containing cannabis and cannabis-derived compounds in ways that violate the Federal Food, Drug and Cosmetic Act (FD&C Act) and that may put the health and safety of consumers at risk. The FDA, a Federal agency, has not enforced the CBD status since 2018.   Legality: Some manufacturers ship CBD products nationally, which is illegal. Often such products are sold online and are therefore available throughout the country. CBD is openly sold in head shops and health food stores in some states where such sales have not been explicitly legalized. Selling unapproved products with unsubstantiated therapeutic claims is not only a violation of the law, but also can put patients at risk, as these products have not been proven to  be safe or effective. Federal illegality makes it difficult to conduct research on CBD.  Reference: "FDA Regulation of Cannabis and Cannabis-Derived Products, Including Cannabidiol (CBD)" - https://www.fda.gov/news-events/public-health-focus/fda-regulation-cannabis-and-cannabis-derived-products-including-cannabidiol-cbd  Warning: CBD is not FDA approved and has not undergo the same manufacturing controls as prescription drugs.  This means that the purity and safety of available CBD may be questionable. Most of the time, despite manufacturer's claims, it is contaminated with THC (delta-9-tetrahydrocannabinol - the chemical in marijuana responsible for the "HIGH").  When this is the case, the THC contaminant will trigger a positive urine drug screen (UDS) test for Marijuana (carboxy-THC). Because a positive UDS for any illicit substance is a violation of our medication agreement, your opioid analgesics (pain medicine) may be permanently discontinued.  MORE ABOUT CBD  General Information: CBD  is a derivative of the Marijuana (cannabis sativa) plant discovered in 1940. It is one of the 113 identified substances found in Marijuana. It accounts for up to 40% of the plant's extract. As of 2018, preliminary clinical studies on CBD included research for the treatment of anxiety, movement disorders, and pain. CBD is available and consumed in multiple forms, including inhalation of smoke or vapor, as an aerosol spray, and by mouth. It may be supplied as an oil containing CBD, capsules, dried cannabis, or as a liquid solution. CBD is thought not to be as psychoactive as THC (delta-9-tetrahydrocannabinol - the chemical in marijuana responsible for the "HIGH"). Studies suggest that CBD may interact   with different biological target receptors in the body, including cannabinoid and other neurotransmitter receptors. As of 2018 the mechanism of action for its biological effects has not been determined.  Side-effects   Adverse reactions: Dry mouth, diarrhea, decreased appetite, fatigue, drowsiness, malaise, weakness, sleep disturbances, and others.  Drug interactions: CBC may interact with other medications such as blood-thinners. (Last update: 07/20/2020) ____________________________________________________________________________________________   ____________________________________________________________________________________________  Medication Rules  Purpose: To inform patients, and their family members, of our rules and regulations.  Applies to: All patients receiving prescriptions (written or electronic).  Pharmacy of record: Pharmacy where electronic prescriptions will be sent. If written prescriptions are taken to a different pharmacy, please inform the nursing staff. The pharmacy listed in the electronic medical record should be the one where you would like electronic prescriptions to be sent.  Electronic prescriptions: In compliance with the [place] Strengthen Opioid Misuse Prevention (STOP) Act of 2017 (Session Law 2017-74/H243), effective December 14, 2018, all controlled substances must be electronically prescribed. Calling prescriptions to the pharmacy will cease to exist.  Prescription refills: Only during scheduled appointments. Applies to all prescriptions.  NOTE: The following applies primarily to controlled substances (Opioid* Pain Medications).   Type of encounter (visit): For patients receiving controlled substances, face-to-face visits are required. (Not an option or up to the patient.)  Patient's responsibilities: 1. Pain Pills: Bring all pain pills to every appointment (except for procedure appointments). 2. Pill Bottles: Bring pills in original pharmacy bottle. Always bring the newest bottle. Bring bottle, even if empty. 3. Medication refills: You are responsible for knowing and keeping track of what medications you take and those you need refilled. The day before  your appointment: write a list of all prescriptions that need to be refilled. The day of the appointment: give the list to the admitting nurse. Prescriptions will be written only during appointments. No prescriptions will be written on procedure days. If you forget a medication: it will not be "Called in", "Faxed", or "electronically sent". You will need to get another appointment to get these prescribed. No early refills. Do not call asking to have your prescription filled early. 4. Prescription Accuracy: You are responsible for carefully inspecting your prescriptions before leaving our office. Have the discharge nurse carefully go over each prescription with you, before taking them home. Make sure that your name is accurately spelled, that your address is correct. Check the name and dose of your medication to make sure it is accurate. Check the number of pills, and the written instructions to make sure they are clear and accurate. Make sure that you are given enough medication to last until your next medication refill appointment. 5. Taking Medication: Take medication as prescribed. When it comes to controlled substances, taking less pills or less frequently than prescribed is permitted and encouraged. Never take more pills than instructed. Never take medication more frequently than prescribed.  6. Inform other Doctors: Always inform, all of your healthcare providers, of all the medications you take. 7. Pain Medication from other Providers: You are not allowed to accept any additional pain medication from any other Doctor or Healthcare provider. There are two exceptions to this rule. (see below) In the event that you require additional pain medication, you are responsible for notifying us, as stated below. 8. Cough Medicine: Often these contain an opioid, such as codeine or hydrocodone. Never accept or take cough medicine containing these opioids if you are already taking an opioid* medication. The  combination may cause respiratory failure and   death. 9. Medication Agreement: You are responsible for carefully reading and following our Medication Agreement. This must be signed before receiving any prescriptions from our practice. Safely store a copy of your signed Agreement. Violations to the Agreement will result in no further prescriptions. (Additional copies of our Medication Agreement are available upon request.) 10. Laws, Rules, & Regulations: All patients are expected to follow all Federal and State Laws, Statutes, Rules, & Regulations. Ignorance of the Laws does not constitute a valid excuse.  11. Illegal drugs and Controlled Substances: The use of illegal substances (including, but not limited to marijuana and its derivatives) and/or the illegal use of any controlled substances is strictly prohibited. Violation of this rule may result in the immediate and permanent discontinuation of any and all prescriptions being written by our practice. The use of any illegal substances is prohibited. 12. Adopted CDC guidelines & recommendations: Target dosing levels will be at or below 60 MME/day. Use of benzodiazepines** is not recommended.  Exceptions: There are only two exceptions to the rule of not receiving pain medications from other Healthcare Providers. 1. Exception #1 (Emergencies): In the event of an emergency (i.e.: accident requiring emergency care), you are allowed to receive additional pain medication. However, you are responsible for: As soon as you are able, call our office [phone], at any time of the day or night, and leave a message stating your name, the date and nature of the emergency, and the name and dose of the medication prescribed. In the event that your call is answered by a member of our staff, make sure to document and save the date, time, and the name of the person that took your information.  2. Exception #2 (Planned Surgery): In the event that you are scheduled by  another doctor or dentist to have any type of surgery or procedure, you are allowed (for a period no longer than 30 days), to receive additional pain medication, for the acute post-op pain. However, in this case, you are responsible for picking up a copy of our "Post-op Pain Management for Surgeons" handout, and giving it to your surgeon or dentist. This document is available at our office, and does not require an appointment to obtain it. Simply go to our office during business hours (Monday-Thursday from 8:00 AM to 4:00 PM) (Friday 8:00 AM to 12:00 Noon) or if you have a scheduled appointment with us, prior to your surgery, and ask for it by name. In addition, you are responsible for: calling our office [phone], at any time of the day or night, and leaving a message stating your name, name of your surgeon, type of surgery, and date of procedure or surgery. Failure to comply with your responsibilities may result in termination of therapy involving the controlled substances.  *Opioid medications include: morphine, codeine, oxycodone, oxymorphone, hydrocodone, hydromorphone, meperidine, tramadol, tapentadol, buprenorphine, fentanyl, methadone. **Benzodiazepine medications include: diazepam (Valium), alprazolam (Xanax), clonazepam (Klonopine), lorazepam (Ativan), clorazepate (Tranxene), chlordiazepoxide (Librium), estazolam (Prosom), oxazepam (Serax), temazepam (Restoril), triazolam (Halcion) (Last updated: 11/11/2020) ____________________________________________________________________________________________    

## 2021-05-06 LAB — TOXASSURE SELECT 13 (MW), URINE

## 2021-05-30 DIAGNOSIS — I10 Essential (primary) hypertension: Secondary | ICD-10-CM | POA: Insufficient documentation

## 2021-05-30 DIAGNOSIS — Z8679 Personal history of other diseases of the circulatory system: Secondary | ICD-10-CM | POA: Insufficient documentation

## 2021-05-30 DIAGNOSIS — R110 Nausea: Secondary | ICD-10-CM | POA: Diagnosis not present

## 2021-05-30 DIAGNOSIS — K529 Noninfective gastroenteritis and colitis, unspecified: Secondary | ICD-10-CM | POA: Diagnosis not present

## 2021-07-27 NOTE — Progress Notes (Signed)
PROVIDER NOTE: Information contained herein reflects review and annotations entered in association with encounter. Interpretation of such information and data should be left to medically-trained personnel. Information provided to patient can be located elsewhere in the medical record under "Patient Instructions". Document created using STT-dictation technology, any transcriptional errors that may result from process are unintentional.    Patient: Larry Goodwin  Service Category: E/M  Provider: Gaspar Cola, MD  DOB: [birth year]  DOS: 07/30/2021  Specialty: Interventional Pain Management  MRN: [id number]  Setting: Ambulatory outpatient  PCP: Larry Bush, MD  Type: Established Patient    Referring Provider: Ria Bush, MD  Location: Office  Delivery: Face-to-face     HPI  [name], a 45 y.o. year old male, is here today because of his Chronic pain syndrome [G89.4]. [name]'s primary complain today is Back Pain (lower) Last encounter: My last encounter with him was on 04/30/2021. Pertinent problems: Mr. 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 Pain Assessment: Severity of Chronic pain is reported as a 5 /10. Location: Back Lower/pain radiaties down to his right buttock. Onset: More than a month ago. Quality: Throbbing, Constant, Aching. Timing: Constant. Modifying factor(s): Goodwin down, meds. Vitals:  height is '6\' 2"'  (1.88 m) and weight is 320 lb (145.2 kg) (abnormal). His temporal temperature is 96.9 F (36.1 C) (abnormal). His blood pressure is 124/100 (abnormal) and his pulse is 90. His respiration is 18 and oxygen saturation is 100%.   Reason for encounter: medication management.   The patient indicates doing well with the current medication regimen. No adverse reactions or side effects reported to the medications.   RTCB: 10/28/2021 Nonopioids transfer 10/28/2020: Magnesium (OTC)  Pharmacotherapy Assessment  Analgesic: Oxycodone IR 5 mg, 1 tab PO q 6 hrs (20 mg/day of oxycodone) MME/day: 30 mg/day.   Monitoring: [place] PMP: PDMP reviewed during this encounter.       Pharmacotherapy: No side-effects or adverse reactions reported. Compliance: No problems identified. Effectiveness: Clinically acceptable.  Chauncey Fischer, RN  07/30/2021 12:40 PM  Sign when Signing Visit Nursing Pain Medication Assessment:  Safety precautions to be maintained throughout the outpatient stay will include: orient to surroundings, keep bed in low position, maintain call bell within reach at all times, provide assistance with transfer out of bed and ambulation.  Medication Inspection Compliance: Pill count conducted under aseptic conditions, in front of the patient. Neither the pills nor the bottle was removed from the patient's sight at any time. Once count was completed pills were immediately  returned to the patient in their original bottle.  Medication: Oxycodone IR Pill/Patch Count:  0 of  120 pills remain Pill/Patch Appearance: Markings consistent with prescribed medication Bottle Appearance: Standard pharmacy container. Clearly labeled. Filled Date: 7 / 76 / 2022 Last Medication intake:  Yesterday Safety precautions to be maintained throughout the outpatient stay will include: orient to surroundings, keep bed in low position, maintain call bell within reach at all times, provide assistance with transfer out of bed and ambulation.      UDS:  Summary  Date Value Ref Range Status  04/30/2021 Note  Final    Comment:    ==================================================================== [employer] 13 (MW) ==================================================================== Test                             Result       Flag       Units  Drug Present and Declared for Prescription Verification   Oxycodone                      806          EXPECTED   ng/mg creat   Oxymorphone                    778          EXPECTED   ng/mg creat   Noroxycodone                   1343         EXPECTED   ng/mg creat   Noroxymorphone                 246          EXPECTED   ng/mg creat    Sources of oxycodone are scheduled prescription medications.    Oxymorphone, noroxycodone, and noroxymorphone are expected    metabolites of oxycodone. Oxymorphone is also available as a    scheduled prescription medication.  ==================================================================== Test                      Result    Flag   Units      Ref Range   Creatinine              54               mg/dL      >=20 ==================================================================== Declared Medications:  The flagging and interpretation on this report are based on the  following declared medications.  Unexpected results may arise from  inaccuracies in the declared medications.   **Note: The testing  scope of this panel includes these medications:   Oxycodone   **Note: The testing scope of this panel does not include the  following reported medications:   Cyanocobalamin  Lisinopril  Magnesium (Mag-Ox)  Omeprazole  Sildenafil  Vitamin D3 ==================================================================== For clinical consultation, please call [phone]. ====================================================================      ROS  Constitutional: Denies any fever or chills Gastrointestinal: No reported hemesis, hematochezia, vomiting, or acute GI distress Musculoskeletal: Denies any acute onset joint swelling, redness, loss of ROM, or weakness Neurological: No reported episodes of acute onset apraxia, aphasia, dysarthria, agnosia, amnesia, paralysis, loss of coordination, or loss of consciousness  Medication Review  Magnesium Oxide, Vitamin D3, lisinopril, omeprazole, oxyCODONE, sildenafil, and vitamin B-12  History Review  Allergy: [name] is allergic to levaquin [levofloxacin in d5w]. Drug: [name]  reports current drug use. Drug: Oxycodone. Alcohol:  reports no history of alcohol use. Tobacco:  reports that he quit smoking about 13 years ago. His smoking use included cigarettes. He has never used smokeless tobacco. Social: [name]  reports that he quit smoking about 13 years ago. His smoking use included cigarettes. He has never used smokeless tobacco. He reports current drug use. Drug: Oxycodone. He reports that he does not drink alcohol. Medical:  has a past medical history of Bell's palsy (2015), Chronic pain syndrome, DDD (degenerative disc disease), lumbar, HTN (hypertension), and Morbid obesity ([place]) (08/16/2014). Surgical: [name]  has a past surgical history that includes Back surgery (2012) and Cholecystectomy. Family: family history includes COPD in his mother; Hypertension in an other family member.  Laboratory Chemistry Profile    Renal Lab Results  Component Value Date   BUN 13 12/27/2020   CREATININE 0.80 81/12/7508   BCR NOT APPLICABLE 25/85/2778   GFR 97.98 07/19/2019   GFRAA 129 02/06/2019   GFRNONAA 112 02/06/2019    Hepatic Lab Results  Component Value Date   AST 13 12/27/2020   ALT 12 12/27/2020   ALBUMIN 4.3 07/19/2019   ALKPHOS 45 07/19/2019   LIPASE 153 09/21/2012    Electrolytes Lab Results  Component Value Date   NA 138 12/27/2020   K 4.7 12/27/2020   CL 101 12/27/2020   CALCIUM 8.8 12/27/2020   MG 1.8 12/27/2020    Bone Lab Results  Component Value Date   VD25OH 47 12/27/2020   25OHVITD1 35 02/06/2019   25OHVITD2 <1.0 02/06/2019   25OHVITD3 34 02/06/2019    Inflammation (CRP: Acute Phase) (ESR: Chronic Phase) Lab Results  Component Value Date   CRP 9 02/06/2019   ESRSEDRATE 6 02/06/2019         Note: Above Lab results reviewed.  Recent Imaging Review  DG Foot Complete Right CLINICAL DATA:  Jarring foot 3 days prior, no fall  EXAM: RIGHT FOOT COMPLETE - 3+ VIEW  COMPARISON:  Radiograph 03/24/2012  FINDINGS: No acute bony abnormality. Specifically, no fracture, subluxation, or dislocation. Mild degenerative changes are noted in the midfoot. Posterior calcaneal spur is present. Trace ankle effusion. Soft tissues are otherwise unremarkable  IMPRESSION: No acute fracture or traumatic malalignment.  Mild degenerative changes in the midfoot.  Posterior calcaneal spur.  Trace ankle effusion.  Electronically Signed   By: Lovena Le M.D.   On: 02/12/2020 19:03 Note: Reviewed        Physical Exam  General appearance: Well nourished, well developed, and well hydrated. In no apparent acute distress Mental status: Alert, oriented x 3 (person, place, & time)       Respiratory: No evidence of acute respiratory distress Eyes: PERLA Vitals: BP (!) 124/100 (BP Location: Left Arm)   Pulse 90   Temp (!) 96.9 F (36.1 C) (Temporal)   Resp 18   Ht '6\' 2"'  (1.88 m)    Wt (!) 320 lb (145.2 kg)   SpO2 100%   BMI 41.09 kg/m  BMI: Estimated body mass index is 41.09 kg/m as calculated from the following:   Height as of this encounter: '6\' 2"'  (1.88 m).   Weight as of this encounter: 320 lb (145.2 kg). Ideal: Ideal body weight: 82.2 kg (181 lb 3.5 oz) Adjusted ideal body weight: 107.4 kg (236 lb 11.7 oz)  Assessment   Status Diagnosis  Controlled Controlled Controlled 1. Chronic pain syndrome   2. Pharmacologic therapy   3. Chronic use of opiate for therapeutic purpose   4. Encounter for medication management   5. Encounter for  chronic pain management   6. Chronic low back pain (1ry area of Pain) (Bilateral) (R>L)   7. Chronic hip pain (2ry area of Pain) (Right)   8. Chronic knee pain (Right)   9. Chronic sacroiliac joint pain (Right)   10. Chronic upper back pain (3ry area of Pain)   11. Chronic shoulder pain (4th area of Pain) (Bilateral) (Right)      Updated Problems: Problem  Long Term (Current) Use of Opiate Analgesic  Htn (Hypertension)   Formatting of this note might be different from the original. Last Assessment & Plan:  Formatting of this note might be different from the original. Chronic, stable. Continue current regimen.     Plan of Care  Problem-specific:  No problem-specific Assessment & Plan notes found for this encounter.  [name] has a current medication list which includes the following long-term medication(s): vitamin d3, lisinopril, omeprazole, sildenafil, magnesium oxide, oxycodone, [START ON 08/29/2021] oxycodone, and [START ON 09/28/2021] oxycodone.  Pharmacotherapy (Medications Ordered): Meds ordered this encounter  Medications   oxyCODONE (OXY IR/ROXICODONE) 5 MG immediate release tablet    Sig: Take 1 tablet (5 mg total) by mouth every 6 (six) hours as needed for severe pain. Must last 30 days    Dispense:  120 tablet    Refill:  0    Not a duplicate. Do NOT delete! Dispense 1 day early if  closed on refill date. Avoid benzodiazepines within 8 hours of opioids. Do not send refill requests.   oxyCODONE (OXY IR/ROXICODONE) 5 MG immediate release tablet    Sig: Take 1 tablet (5 mg total) by mouth every 6 (six) hours as needed for severe pain. Must last 30 days    Dispense:  120 tablet    Refill:  0    Not a duplicate. Do NOT delete! Dispense 1 day early if closed on refill date. Avoid benzodiazepines within 8 hours of opioids. Do not send refill requests.   oxyCODONE (OXY IR/ROXICODONE) 5 MG immediate release tablet    Sig: Take 1 tablet (5 mg total) by mouth every 6 (six) hours as needed for severe pain. Must last 30 days    Dispense:  120 tablet    Refill:  0    Not a duplicate. Do NOT delete! Dispense 1 day early if closed on refill date. Avoid benzodiazepines within 8 hours of opioids. Do not send refill requests.    Orders:  No orders of the defined types were placed in this encounter.  Follow-up plan:   Return in about 3 months (around 10/28/2021) for Eval-day(M,W), (F2F), (MM).     Interventional treatment options: Planned, scheduled, and/or pending:   Not at this time.   Under consideration:   Diagnostic right CESI  Diagnostic thoracic ESI  Diagnostic bilateral thoracic facet block  Possible bilateral thoracic facet RFA  Diagnostic bilateral lumbar facet block  Possible bilateral lumbar facet RFA  Diagnostic right sacroiliac joint block  Possible right sacroiliac joint RFA  Diagnostic caudal ESI and epidurogram  Diagnostic right-sided L4-5 TFESI  Diagnostic right IA knee injection  Diagnostic right genicular NB  Possible right genicular RFA    Therapeutic/palliative (PRN):   None at this time    Recent Visits No visits were found meeting these conditions. Showing recent visits within past 90 days and meeting all other requirements Today's Visits Date Type Provider Dept  07/30/21 Office Visit Milinda Pointer, MD [facility]  Showing  today's visits and meeting all other requirements  Future Appointments No visits were found meeting these conditions. Showing future appointments within next 90 days and meeting all other requirements I discussed the assessment and treatment plan with the patient. The patient was provided an opportunity to ask questions and all were answered. The patient agreed with the plan and demonstrated an understanding of the instructions.  Patient advised to call back or seek an in-person evaluation if the symptoms or condition worsens.  Duration of encounter: 30 minutes.  Note by: Larry Cola, MD Date: 07/30/2021; Time: 12:57 PM

## 2021-07-30 ENCOUNTER — Encounter: Payer: Self-pay | Admitting: Pain Medicine

## 2021-07-30 ENCOUNTER — Ambulatory Visit: Payer: Medicare Other | Attending: Pain Medicine | Admitting: Pain Medicine

## 2021-07-30 ENCOUNTER — Other Ambulatory Visit: Payer: Self-pay

## 2021-07-30 VITALS — BP 124/100 | HR 90 | Temp 96.9°F | Resp 18 | Ht 74.0 in | Wt 320.0 lb

## 2021-07-30 DIAGNOSIS — M549 Dorsalgia, unspecified: Secondary | ICD-10-CM

## 2021-07-30 DIAGNOSIS — M25561 Pain in right knee: Secondary | ICD-10-CM | POA: Diagnosis present

## 2021-07-30 DIAGNOSIS — Z79899 Other long term (current) drug therapy: Secondary | ICD-10-CM

## 2021-07-30 DIAGNOSIS — G8929 Other chronic pain: Secondary | ICD-10-CM

## 2021-07-30 DIAGNOSIS — M25551 Pain in right hip: Secondary | ICD-10-CM

## 2021-07-30 DIAGNOSIS — Z79891 Long term (current) use of opiate analgesic: Secondary | ICD-10-CM

## 2021-07-30 DIAGNOSIS — G894 Chronic pain syndrome: Secondary | ICD-10-CM

## 2021-07-30 DIAGNOSIS — M25512 Pain in left shoulder: Secondary | ICD-10-CM | POA: Diagnosis present

## 2021-07-30 DIAGNOSIS — M533 Sacrococcygeal disorders, not elsewhere classified: Secondary | ICD-10-CM | POA: Diagnosis present

## 2021-07-30 DIAGNOSIS — M545 Low back pain, unspecified: Secondary | ICD-10-CM

## 2021-07-30 DIAGNOSIS — M25511 Pain in right shoulder: Secondary | ICD-10-CM

## 2021-07-30 MED ORDER — OXYCODONE HCL 5 MG PO TABS: 5.0000 mg | ORAL_TABLET | Freq: Four times a day (QID) | ORAL | 0 refills | Status: DC | PRN

## 2021-07-30 NOTE — Progress Notes (Signed)
Nursing Pain Medication Assessment:  Safety precautions to be maintained throughout the outpatient stay will include: orient to surroundings, keep bed in low position, maintain call bell within reach at all times, provide assistance with transfer out of bed and ambulation.  Medication Inspection Compliance: Pill count conducted under aseptic conditions, in front of the patient. Neither the pills nor the bottle was removed from the patient's sight at any time. Once count was completed pills were immediately returned to the patient in their original bottle.  Medication: Oxycodone IR Pill/Patch Count:  0 of 120 pills remain Pill/Patch Appearance: Markings consistent with prescribed medication Bottle Appearance: Standard pharmacy container. Clearly labeled. Filled Date: 7 / 30 / 2022 Last Medication intake:  Yesterday Safety precautions to be maintained throughout the outpatient stay will include: orient to surroundings, keep bed in low position, maintain call bell within reach at all times, provide assistance with transfer out of bed and ambulation.

## 2021-07-30 NOTE — Patient Instructions (Signed)
____________________________________________________________________________________________  Medication Rules  Purpose: To inform patients, and their family members, of our rules and regulations.  Applies to: All patients receiving prescriptions (written or electronic).  Pharmacy of record: Pharmacy where electronic prescriptions will be sent. If written prescriptions are taken to a different pharmacy, please inform the nursing staff. The pharmacy listed in the electronic medical record should be the one where you would like electronic prescriptions to be sent.  Electronic prescriptions: In compliance with the [place] Strengthen Opioid Misuse Prevention (STOP) Act of 2017 (Session Law 2017-74/H243), effective December 14, 2018, all controlled substances must be electronically prescribed. Calling prescriptions to the pharmacy will cease to exist.  Prescription refills: Only during scheduled appointments. Applies to all prescriptions.  NOTE: The following applies primarily to controlled substances (Opioid* Pain Medications).   Type of encounter (visit): For patients receiving controlled substances, face-to-face visits are required. (Not an option or up to the patient.)  Patient's responsibilities: Pain Pills: Bring all pain pills to every appointment (except for procedure appointments). Pill Bottles: Bring pills in original pharmacy bottle. Always bring the newest bottle. Bring bottle, even if empty. Medication refills: You are responsible for knowing and keeping track of what medications you take and those you need refilled. The day before your appointment: write a list of all prescriptions that need to be refilled. The day of the appointment: give the list to the admitting nurse. Prescriptions will be written only during appointments. No prescriptions will be written on procedure days. If you forget a medication: it will not be "Called in", "Faxed", or "electronically sent". You will  need to get another appointment to get these prescribed. No early refills. Do not call asking to have your prescription filled early. Prescription Accuracy: You are responsible for carefully inspecting your prescriptions before leaving our office. Have the discharge nurse carefully go over each prescription with you, before taking them home. Make sure that your name is accurately spelled, that your address is correct. Check the name and dose of your medication to make sure it is accurate. Check the number of pills, and the written instructions to make sure they are clear and accurate. Make sure that you are given enough medication to last until your next medication refill appointment. Taking Medication: Take medication as prescribed. When it comes to controlled substances, taking less pills or less frequently than prescribed is permitted and encouraged. Never take more pills than instructed. Never take medication more frequently than prescribed.  Inform other Doctors: Always inform, all of your healthcare providers, of all the medications you take. Pain Medication from other Providers: You are not allowed to accept any additional pain medication from any other Doctor or Healthcare provider. There are two exceptions to this rule. (see below) In the event that you require additional pain medication, you are responsible for notifying us, as stated below. Cough Medicine: Often these contain an opioid, such as codeine or hydrocodone. Never accept or take cough medicine containing these opioids if you are already taking an opioid* medication. The combination may cause respiratory failure and death. Medication Agreement: You are responsible for carefully reading and following our Medication Agreement. This must be signed before receiving any prescriptions from our practice. Safely store a copy of your signed Agreement. Violations to the Agreement will result in no further prescriptions. (Additional copies of our  Medication Agreement are available upon request.) Laws, Rules, & Regulations: All patients are expected to follow all Federal and State Laws, Statutes, Rules, & Regulations. Ignorance of   the Laws does not constitute a valid excuse.  Illegal drugs and Controlled Substances: The use of illegal substances (including, but not limited to marijuana and its derivatives) and/or the illegal use of any controlled substances is strictly prohibited. Violation of this rule may result in the immediate and permanent discontinuation of any and all prescriptions being written by our practice. The use of any illegal substances is prohibited. Adopted CDC guidelines & recommendations: Target dosing levels will be at or below 60 MME/day. Use of benzodiazepines** is not recommended.  Exceptions: There are only two exceptions to the rule of not receiving pain medications from other Healthcare Providers. Exception #1 (Emergencies): In the event of an emergency (i.e.: accident requiring emergency care), you are allowed to receive additional pain medication. However, you are responsible for: As soon as you are able, call our office [phone], at any time of the day or night, and leave a message stating your name, the date and nature of the emergency, and the name and dose of the medication prescribed. In the event that your call is answered by a member of our staff, make sure to document and save the date, time, and the name of the person that took your information.  Exception #2 (Planned Surgery): In the event that you are scheduled by another doctor or dentist to have any type of surgery or procedure, you are allowed (for a period no longer than 30 days), to receive additional pain medication, for the acute post-op pain. However, in this case, you are responsible for picking up a copy of our "Post-op Pain Management for Surgeons" handout, and giving it to your surgeon or dentist. This document is available at our office, and  does not require an appointment to obtain it. Simply go to our office during business hours (Monday-Thursday from 8:00 AM to 4:00 PM) (Friday 8:00 AM to 12:00 Noon) or if you have a scheduled appointment with us, prior to your surgery, and ask for it by name. In addition, you are responsible for: calling our office [phone], at any time of the day or night, and leaving a message stating your name, name of your surgeon, type of surgery, and date of procedure or surgery. Failure to comply with your responsibilities may result in termination of therapy involving the controlled substances.  *Opioid medications include: morphine, codeine, oxycodone, oxymorphone, hydrocodone, hydromorphone, meperidine, tramadol, tapentadol, buprenorphine, fentanyl, methadone. **Benzodiazepine medications include: diazepam (Valium), alprazolam (Xanax), clonazepam (Klonopine), lorazepam (Ativan), clorazepate (Tranxene), chlordiazepoxide (Librium), estazolam (Prosom), oxazepam (Serax), temazepam (Restoril), triazolam (Halcion) (Last updated: 11/11/2020) ____________________________________________________________________________________________  ____________________________________________________________________________________________  Medication Recommendations and Reminders  Applies to: All patients receiving prescriptions (written and/or electronic).  Medication Rules & Regulations: These rules and regulations exist for your safety and that of others. They are not flexible and neither are we. Dismissing or ignoring them will be considered "non-compliance" with medication therapy, resulting in complete and irreversible termination of such therapy. (See document titled "Medication Rules" for more details.) In all conscience, because of safety reasons, we cannot continue providing a therapy where the patient does not follow instructions.  Pharmacy of record:  Definition: This is the pharmacy where your electronic  prescriptions will be sent.  We do not endorse any particular pharmacy, however, we have experienced problems with [facility] not securing enough medication supply for the community. We do not restrict you in your choice of pharmacy. However, once we write for your prescriptions, we will NOT be re-sending more prescriptions to fix restricted supply problems   created by your pharmacy, or your insurance.  The pharmacy listed in the electronic medical record should be the one where you want electronic prescriptions to be sent. If you choose to change pharmacy, simply notify our nursing staff.  Recommendations: Keep all of your pain medications in a safe place, under lock and key, even if you live alone. We will NOT replace lost, stolen, or damaged medication. After you fill your prescription, take 1 week's worth of pills and put them away in a safe place. You should keep a separate, properly labeled bottle for this purpose. The remainder should be kept in the original bottle. Use this as your primary supply, until it runs out. Once it's gone, then you know that you have 1 week's worth of medicine, and it is time to come in for a prescription refill. If you do this correctly, it is unlikely that you will ever run out of medicine. To make sure that the above recommendation works, it is very important that you make sure your medication refill appointments are scheduled at least 1 week before you run out of medicine. To do this in an effective manner, make sure that you do not leave the office without scheduling your next medication management appointment. Always ask the nursing staff to show you in your prescription , when your medication will be running out. Then arrange for the receptionist to get you a return appointment, at least 7 days before you run out of medicine. Do not wait until you have 1 or 2 pills left, to come in. This is very poor planning and does not take into consideration that we may need to  cancel appointments due to bad weather, sickness, or emergencies affecting our staff. DO NOT ACCEPT A "Partial Fill": If for any reason your pharmacy does not have enough pills/tablets to completely fill or refill your prescription, do not allow for a "partial fill". The law allows the pharmacy to complete that prescription within 72 hours, without requiring a new prescription. If they do not fill the rest of your prescription within those 72 hours, you will need a separate prescription to fill the remaining amount, which we will NOT provide. If the reason for the partial fill is your insurance, you will need to talk to the pharmacist about payment alternatives for the remaining tablets, but again, DO NOT ACCEPT A PARTIAL FILL, unless you can trust your pharmacist to obtain the remainder of the pills within 72 hours.  Prescription refills and/or changes in medication(s):  Prescription refills, and/or changes in dose or medication, will be conducted only during scheduled medication management appointments. (Applies to both, written and electronic prescriptions.) No refills on procedure days. No medication will be changed or started on procedure days. No changes, adjustments, and/or refills will be conducted on a procedure day. Doing so will interfere with the diagnostic portion of the procedure. No phone refills. No medications will be "called into the pharmacy". No Fax refills. No weekend refills. No Holliday refills. No after hours refills.  Remember:  Business hours are:  Monday to Thursday 8:00 AM to 4:00 PM Provider's Schedule: Aileena Iglesia, MD - Appointments are:  Medication management: Monday and Wednesday 8:00 AM to 4:00 PM Procedure day: Tuesday and Thursday 7:30 AM to 4:00 PM Bilal Lateef, MD - Appointments are:  Medication management: Tuesday and Thursday 8:00 AM to 4:00 PM Procedure day: Monday and Wednesday 7:30 AM to 4:00 PM (Last update:  07/03/2020) ____________________________________________________________________________________________  ____________________________________________________________________________________________  CBD (cannabidiol) WARNING    Applicable to: All individuals currently taking or considering taking CBD (cannabidiol) and, more important, all patients taking opioid analgesic controlled substances (pain medication). (Example: oxycodone; oxymorphone; hydrocodone; hydromorphone; morphine; methadone; tramadol; tapentadol; fentanyl; buprenorphine; butorphanol; dextromethorphan; meperidine; codeine; etc.)  Legal status: CBD remains a Schedule I drug prohibited for any use. CBD is illegal with one exception. In the [place], CBD has a limited Food and Drug Administration (FDA) approval for the treatment of two specific types of epilepsy disorders. Only one CBD product has been approved by the FDA for this purpose: "Epidiolex". FDA is aware that some companies are marketing products containing cannabis and cannabis-derived compounds in ways that violate the Federal Food, Drug and Cosmetic Act (FD&C Act) and that may put the health and safety of consumers at risk. The FDA, a Federal agency, has not enforced the CBD status since 2018.   Legality: Some manufacturers ship CBD products nationally, which is illegal. Often such products are sold online and are therefore available throughout the country. CBD is openly sold in head shops and health food stores in some states where such sales have not been explicitly legalized. Selling unapproved products with unsubstantiated therapeutic claims is not only a violation of the law, but also can put patients at risk, as these products have not been proven to be safe or effective. Federal illegality makes it difficult to conduct research on CBD.  Reference: "FDA Regulation of Cannabis and Cannabis-Derived Products, Including Cannabidiol (CBD)" -  https://www.fda.gov/news-events/public-health-focus/fda-regulation-cannabis-and-cannabis-derived-products-including-cannabidiol-cbd  Warning: CBD is not FDA approved and has not undergo the same manufacturing controls as prescription drugs.  This means that the purity and safety of available CBD may be questionable. Most of the time, despite manufacturer's claims, it is contaminated with THC (delta-9-tetrahydrocannabinol - the chemical in marijuana responsible for the "HIGH").  When this is the case, the THC contaminant will trigger a positive urine drug screen (UDS) test for Marijuana (carboxy-THC). Because a positive UDS for any illicit substance is a violation of our medication agreement, your opioid analgesics (pain medicine) may be permanently discontinued.  MORE ABOUT CBD  General Information: CBD  is a derivative of the Marijuana (cannabis sativa) plant discovered in 1940. It is one of the 113 identified substances found in Marijuana. It accounts for up to 40% of the plant's extract. As of 2018, preliminary clinical studies on CBD included research for the treatment of anxiety, movement disorders, and pain. CBD is available and consumed in multiple forms, including inhalation of smoke or vapor, as an aerosol spray, and by mouth. It may be supplied as an oil containing CBD, capsules, dried cannabis, or as a liquid solution. CBD is thought not to be as psychoactive as THC (delta-9-tetrahydrocannabinol - the chemical in marijuana responsible for the "HIGH"). Studies suggest that CBD may interact with different biological target receptors in the body, including cannabinoid and other neurotransmitter receptors. As of 2018 the mechanism of action for its biological effects has not been determined.  Side-effects  Adverse reactions: Dry mouth, diarrhea, decreased appetite, fatigue, drowsiness, malaise, weakness, sleep disturbances, and others.  Drug interactions: CBC may interact with other medications  such as blood-thinners. (Last update: 07/20/2020) ____________________________________________________________________________________________  ____________________________________________________________________________________________  Drug Holidays (Slow)  What is a "Drug Holiday"? Drug Holiday: is the name given to the period of time during which a patient stops taking a medication(s) for the purpose of eliminating tolerance to the drug.  Benefits Improved effectiveness of opioids. Decreased opioid dose needed to achieve benefits. Improved pain with lesser dose.    What is tolerance? Tolerance: is the progressive decreased in effectiveness of a drug due to its repetitive use. With repetitive use, the body gets use to the medication and as a consequence, it loses its effectiveness. This is a common problem seen with opioid pain medications. As a result, a larger dose of the drug is needed to achieve the same effect that used to be obtained with a smaller dose.  How long should a "Drug Holiday" last? You should stay off of the pain medicine for at least 14 consecutive days. (2 weeks)  Should I stop the medicine "cold turkey"? No. You should always coordinate with your Pain Specialist so that he/she can provide you with the correct medication dose to make the transition as smoothly as possible.  How do I stop the medicine? Slowly. You will be instructed to decrease the daily amount of pills that you take by one (1) pill every seven (7) days. This is called a "slow downward taper" of your dose. For example: if you normally take four (4) pills per day, you will be asked to drop this dose to three (3) pills per day for seven (7) days, then to two (2) pills per day for seven (7) days, then to one (1) per day for seven (7) days, and at the end of those last seven (7) days, this is when the "Drug Holiday" would start.   Will I have withdrawals? By doing a "slow downward taper" like this one, it  is unlikely that you will experience any significant withdrawal symptoms. Typically, what triggers withdrawals is the sudden stop of a high dose opioid therapy. Withdrawals can usually be avoided by slowly decreasing the dose over a prolonged period of time. If you do not follow these instructions and decide to stop your medication abruptly, withdrawals may be possible.  What are withdrawals? Withdrawals: refers to the wide range of symptoms that occur after stopping or dramatically reducing opiate drugs after heavy and prolonged use. Withdrawal symptoms do not occur to patients that use low dose opioids, or those who take the medication sporadically. Contrary to benzodiazepine (example: Valium, Xanax, etc.) or alcohol withdrawals ("Delirium Tremens"), opioid withdrawals are not lethal. Withdrawals are the physical manifestation of the body getting rid of the excess receptors.  Expected Symptoms Early symptoms of withdrawal may include: Agitation Anxiety Muscle aches Increased tearing Insomnia Runny nose Sweating Yawning  Late symptoms of withdrawal may include: Abdominal cramping Diarrhea Dilated pupils Goose bumps Nausea Vomiting  Will I experience withdrawals? Due to the slow nature of the taper, it is very unlikely that you will experience any.  What is a slow taper? Taper: refers to the gradual decrease in dose.  (Last update: 07/03/2020) ____________________________________________________________________________________________    

## 2021-08-20 ENCOUNTER — Other Ambulatory Visit: Payer: Self-pay

## 2021-08-20 ENCOUNTER — Telehealth (INDEPENDENT_AMBULATORY_CARE_PROVIDER_SITE_OTHER): Payer: Medicare Other | Admitting: Nurse Practitioner

## 2021-08-20 VITALS — HR 83 | Temp 98.1°F

## 2021-08-20 DIAGNOSIS — J3489 Other specified disorders of nose and nasal sinuses: Secondary | ICD-10-CM | POA: Insufficient documentation

## 2021-08-20 MED ORDER — AMOXICILLIN-POT CLAVULANATE 875-125 MG PO TABS: 1.0000 | ORAL_TABLET | Freq: Two times a day (BID) | ORAL | 0 refills | Status: DC

## 2021-08-20 NOTE — Progress Notes (Signed)
Patient ID: Larry Goodwin, male    DOB: Dec 01, 1976, 45 y.o.   MRN: [id number]  Virtual visit completed through [place], a video enabled telemedicine application. Due to national recommendations of social distancing due to COVID-19, a virtual visit is felt to be most appropriate for this patient at this time. Reviewed limitations, risks, security and privacy concerns of performing a virtual visit and the availability of in person appointments. I also reviewed that there may be a patient responsible charge related to this service. The patient agreed to proceed.   Patient location: home Provider location: [place] at [facility], office Persons participating in this virtual visit: patient, provider   If any vitals were documented, they were collected by patient at home unless specified below.    Pulse 83   Temp 98.1 F (36.7 C) Comment: per patient  SpO2 96%    CC: Sinus pressure, HA Subjective:   HPI: Larry Goodwin is a 45 y.o. male presenting on 08/20/2021 for Headache (X 1 week ago, sinus pressure pain, nasal congestion, sneezing, runny nose, body aches. No fever.)  Over a week ago. Feels that symptoms are worsening. Sinus pressure/pain, HA, nasal congestion, sneezing. Two at home Covid test that were negative "a few days ago". Has not tried anything over the counter. Has tried steam bowl, it helped minimally. Has history of getting sinus infections.    Relevant past medical, surgical, family and social history reviewed and updated as indicated. Interim medical history since our last visit reviewed. Allergies and medications reviewed and updated. Outpatient Medications Prior to Visit  Medication Sig Dispense Refill   Cholecalciferol (VITAMIN D3) 1.25 MG (50000 UT) CAPS Take by mouth in the morning and at bedtime.     lisinopril (ZESTRIL) 40 MG tablet Take 1 tablet (40 mg total) by mouth daily. 90 tablet 3   omeprazole (PRILOSEC) 40 MG capsule Take 1 capsule (40 mg total)  by mouth daily. Daily for 3 weeks then as needed 30 capsule 1   oxyCODONE (OXY IR/ROXICODONE) 5 MG immediate release tablet Take 1 tablet (5 mg total) by mouth every 6 (six) hours as needed for severe pain. Must last 30 days 120 tablet 0   [START ON 08/29/2021] oxyCODONE (OXY IR/ROXICODONE) 5 MG immediate release tablet Take 1 tablet (5 mg total) by mouth every 6 (six) hours as needed for severe pain. Must last 30 days 120 tablet 0   [START ON 09/28/2021] oxyCODONE (OXY IR/ROXICODONE) 5 MG immediate release tablet Take 1 tablet (5 mg total) by mouth every 6 (six) hours as needed for severe pain. Must last 30 days 120 tablet 0   sildenafil (VIAGRA) 100 MG tablet Take 0.5-1 tablets (50-100 mg total) by mouth daily as needed for erectile dysfunction. 5 tablet 3   vitamin B-12 (CYANOCOBALAMIN) 1000 MCG tablet Take 1,000 mcg by mouth daily.     Magnesium Oxide 500 MG CAPS Take 1 capsule (500 mg total) by mouth daily. 30 capsule 2   No facility-administered medications prior to visit.     Per HPI unless specifically indicated in ROS section below Review of Systems  HENT:  Positive for congestion, rhinorrhea, sinus pressure, sinus pain, sneezing and tinnitus. Negative for sore throat.   Respiratory:  Positive for cough. Negative for shortness of breath.   Cardiovascular:  Negative for chest pain.  Gastrointestinal:  Negative for diarrhea, nausea and vomiting.  Neurological:  Positive for headaches.  Objective:  Pulse 83   Temp 98.1 F (36.7 C)  Comment: per patient  SpO2 96%   Wt Readings from Last 3 Encounters:  07/30/21 (!) 320 lb (145.2 kg)  04/30/21 (!) 323 lb 3.2 oz (146.6 kg)  01/29/21 275 lb (124.7 kg)       Physical exam: Gen: alert, NAD, not ill appearing Pulm: speaks in complete sentences without increased work of breathing Psych: normal mood, normal thought content      Results for orders placed or performed in visit on 04/30/21  ToxASSURE Select 13 (MW), Urine  Result Value  Ref Range   Summary Note    Assessment & Plan:   Problem List Items Addressed This Visit       Other   Sinus pressure - Primary    Patient with history of recurrent sinus infections.  States this happens at least yearly.  He has taken 2 at home COVID test that were both negative.  Has not tried anything over-the-counter.  Usually treated with Augmentin twice daily for 10 days.  We will start Augmentin twice daily for 10 days.  Prescription sent to pharmacy on file.  Did recommend using over-the-counter antihistamine such as Allegra, Claritin, Zyrtec, or Xyzal.  Patient acknowledged      Relevant Medications   amoxicillin-clavulanate (AUGMENTIN) 875-125 MG tablet     No orders of the defined types were placed in this encounter.  No orders of the defined types were placed in this encounter.   I discussed the assessment and treatment plan with the patient. The patient was provided an opportunity to ask questions and all were answered. The patient agreed with the plan and demonstrated an understanding of the instructions. The patient was advised to call back or seek an in-person evaluation if the symptoms worsen or if the condition fails to improve as anticipated.  Follow up plan: No follow-ups on file.  Larry Garret, NP

## 2021-08-20 NOTE — Assessment & Plan Note (Signed)
Patient with history of recurrent sinus infections.  States this happens at least yearly.  He has taken 2 at home COVID test that were both negative.  Has not tried anything over-the-counter.  Usually treated with Augmentin twice daily for 10 days.  We will start Augmentin twice daily for 10 days.  Prescription sent to pharmacy on file.  Did recommend using over-the-counter antihistamine such as Allegra, Claritin, Zyrtec, or Xyzal.  Patient acknowledged

## 2021-10-28 NOTE — Progress Notes (Signed)
PROVIDER NOTE: Information contained herein reflects review and annotations entered in association with encounter. Interpretation of such information and data should be left to medically-trained personnel. Information provided to patient can be located elsewhere in the medical record under "Patient Instructions". Document created using STT-dictation technology, any transcriptional errors that may result from process are unintentional.    Patient: Larry Goodwin  Service Category: E/M  Provider: Gaspar Cola, MD  DOB: 1976/03/19  DOS: 10/29/2021  Specialty: Interventional Pain Management  MRN: [id number]  Setting: Ambulatory outpatient  PCP: Ria Bush, MD  Type: Established Patient    Referring Provider: Ria Bush, MD  Location: Office  Delivery: Face-to-face     HPI  [name], a 45 y.o. year old male, is here today because of his Chronic bilateral low back pain without sciatica [M54.50, G89.29]. Larry Goodwin's primary complain today is Back Pain Last encounter: My last encounter with him was on 07/30/2021. Pertinent problems: Larry Goodwin has Lumbar DDD (degenerative disc disease); Chronic pain syndrome; Myofascial pain syndrome; Lumbar facet syndrome (Bilateral) (R>L); Chronic upper back pain (3ry area of Pain); Lumbar spondylosis with radiculopathy; Epidural fibrosis; Failed back surgical syndrome; Chronic low back pain (1ry area of Pain) (Bilateral) (R>L); Chronic sacroiliac joint pain (Right); Abnormal MRI, lumbar spine;  Abnormal CT myelogram of the thoracic spine; Thoracic disc herniation; Bulge of lumbar disc without myelopathy; Vertebral body hemangioma; Cervical IVDD (intervertebral disc displacement); 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 Pain Assessment: Severity of Chronic pain is reported as a 3 /10. Location: Back Mid, Lower/right buttocks. Onset: More than a month ago. Quality: Aching, Constant. Timing: Constant. Modifying factor(s): medications, rest. Vitals:  height is $RemoveB'6\' 2"'aGKmOIly$  (1.88 m) and weight is 320 lb (145.2 kg) (abnormal). His temperature is 97.3 F (36.3 C) (abnormal). His blood pressure is 136/97 (abnormal) and his pulse is 97. His respiration is 16 and oxygen saturation is 100%.   Reason for encounter: medication management.   The patient indicates doing well with the current medication regimen. No adverse reactions or side effects reported to the medications.   RTCB: 01/28/2022 Nonopioids transfer 10/28/2020: Magnesium (OTC)  Pharmacotherapy Assessment  Analgesic: Oxycodone IR 5 mg, 1 tab PO q 6 hrs (20 mg/day of oxycodone) MME/day: 30 mg/day.   Monitoring: [place] PMP: PDMP reviewed during this encounter.       Pharmacotherapy: No side-effects or adverse reactions reported. Compliance: No problems identified. Effectiveness: Clinically acceptable.  Ignatius Specking, RN  10/29/2021 12:59 PM  Sign when Signing Visit Nursing Pain Medication Assessment:  Safety precautions to be maintained throughout the outpatient stay will include: orient to surroundings, keep bed in low position, maintain call bell within reach at all times, provide assistance with transfer out of bed and ambulation.  Medication Inspection Compliance: Pill count conducted under aseptic conditions, in front of the patient. Neither the pills nor the bottle was removed from the patient's sight at any time. Once count was completed pills were immediately  returned to the patient in their original bottle.  Medication: Oxycodone IR Pill/Patch Count:  3 of 120 pills remain  Pill/Patch Appearance: Markings consistent with prescribed medication Bottle Appearance: Standard pharmacy container. Clearly labeled. Filled Date: 36 / 18 / 2022 Last Medication intake:  Today     UDS:  Summary  Date Value Ref Range Status  04/30/2021 Note  Final    Comment:    ==================================================================== [employer] (MW) ==================================================================== Test                             Result       Flag       Units  Drug Present and Declared for Prescription Verification   Oxycodone                      806          EXPECTED   ng/mg creat   Oxymorphone                    778          EXPECTED   ng/mg creat   Noroxycodone                   1343         EXPECTED   ng/mg creat   Noroxymorphone                 246          EXPECTED   ng/mg creat    Sources of oxycodone are scheduled prescription medications.    Oxymorphone, noroxycodone, and noroxymorphone are expected    metabolites of oxycodone. Oxymorphone is also available as a    scheduled prescription medication.  ==================================================================== Test                      Result    Flag   Units      Ref Range   Creatinine              54               mg/dL      >=20 ==================================================================== Declared Medications:  The flagging and interpretation on this report are based on the  following declared medications.  Unexpected results may arise from  inaccuracies in the declared medications.   **Note: The testing scope of this panel includes these medications:   Oxycodone   **Note: The testing scope of this panel does not include the  following reported medications:   Cyanocobalamin  Lisinopril  Magnesium (Mag-Ox)  Omeprazole  Sildenafil   Vitamin D3 ==================================================================== For clinical consultation, please call [phone]. ====================================================================      ROS  Constitutional: Denies any fever or chills Gastrointestinal: No reported hemesis, hematochezia, vomiting, or acute GI distress Musculoskeletal: Denies any acute onset joint swelling, redness, loss of ROM, or weakness Neurological: No reported episodes of acute onset apraxia, aphasia, dysarthria, agnosia, amnesia, paralysis, loss of coordination, or loss of consciousness  Medication Review  Magnesium Oxide, Vitamin D3, lisinopril, omeprazole, oxyCODONE, sildenafil, and vitamin B-12  History Review  Allergy: Larry Goodwin is allergic to levaquin [levofloxacin in d5w]. Drug: Larry Goodwin  reports current drug use. Drug: Oxycodone. Alcohol:  reports no history of alcohol use. Tobacco:  reports that he quit smoking about 13 years ago. His smoking use included cigarettes. He has never used smokeless tobacco. Social: Larry Goodwin  reports that he quit smoking about 13 years ago. His smoking use included cigarettes. He  has never used smokeless tobacco. He reports current drug use. Drug: Oxycodone. He reports that he does not drink alcohol. Medical:  has a past medical history of Bell's palsy (2015), Chronic pain syndrome, DDD (degenerative disc disease), lumbar, HTN (hypertension), and Morbid obesity ([place]) (08/16/2014). Surgical: Larry Goodwin  has a past surgical history that includes Back surgery (2012) and Cholecystectomy. Family: family history includes COPD in his mother; Hypertension in an other family member.  Laboratory Chemistry Profile   Renal Lab Results  Component Value Date   BUN 13 12/27/2020   CREATININE 0.80 03/70/4888   BCR NOT APPLICABLE 91/69/4503   GFR 97.98 07/19/2019   GFRAA 129 02/06/2019   GFRNONAA 112 02/06/2019    Hepatic Lab Results  Component Value Date    AST 13 12/27/2020   ALT 12 12/27/2020   ALBUMIN 4.3 07/19/2019   ALKPHOS 45 07/19/2019   LIPASE 153 09/21/2012    Electrolytes Lab Results  Component Value Date   NA 138 12/27/2020   K 4.7 12/27/2020   CL 101 12/27/2020   CALCIUM 8.8 12/27/2020   MG 1.8 12/27/2020    Bone Lab Results  Component Value Date   VD25OH 47 12/27/2020   25OHVITD1 35 02/06/2019   25OHVITD2 <1.0 02/06/2019   25OHVITD3 34 02/06/2019    Inflammation (CRP: Acute Phase) (ESR: Chronic Phase) Lab Results  Component Value Date   CRP 9 02/06/2019   ESRSEDRATE 6 02/06/2019         Note: Above Lab results reviewed.  Recent Imaging Review  DG Foot Complete Right CLINICAL DATA:  Jarring foot 3 days prior, no fall  EXAM: RIGHT FOOT COMPLETE - 3+ VIEW  COMPARISON:  Radiograph 03/24/2012  FINDINGS: No acute bony abnormality. Specifically, no fracture, subluxation, or dislocation. Mild degenerative changes are noted in the midfoot. Posterior calcaneal spur is present. Trace ankle effusion. Soft tissues are otherwise unremarkable  IMPRESSION: No acute fracture or traumatic malalignment.  Mild degenerative changes in the midfoot.  Posterior calcaneal spur.  Trace ankle effusion.  Electronically Signed   By: Lovena Le M.D.   On: 02/12/2020 19:03 Note: Reviewed        Physical Exam  General appearance: Well nourished, well developed, and well hydrated. In no apparent acute distress Mental status: Alert, oriented x 3 (person, place, & time)       Respiratory: No evidence of acute respiratory distress Eyes: PERLA Vitals: BP (!) 136/97   Pulse 97   Temp (!) 97.3 F (36.3 C)   Resp 16   Ht $R'6\' 2"'qS$  (1.88 m)   Wt (!) 320 lb (145.2 kg)   SpO2 100%   BMI 41.09 kg/m  BMI: Estimated body mass index is 41.09 kg/m as calculated from the following:   Height as of this encounter: $RemoveBeforeD'6\' 2"'ktVARrqxpImjvO$  (1.88 m).   Weight as of this encounter: 320 lb (145.2 kg). Ideal: Ideal body weight: 82.2 kg (181 lb 3.5  oz) Adjusted ideal body weight: 107.4 kg (236 lb 11.7 oz)  Assessment   Status Diagnosis  Controlled Controlled Controlled 1. Chronic low back pain (1ry area of Pain) (Bilateral) (R>L)   2. Chronic hip pain (2ry area of Pain) (Right)   3. Chronic knee pain (Right)   4. Chronic sacroiliac joint pain (Right)   5. Chronic upper back pain (3ry area of Pain)   6. Chronic shoulder pain (4th area of Pain) (Bilateral) (Right)   7. Chronic pain syndrome   8. Pharmacologic therapy   9. Chronic  use of opiate for therapeutic purpose   10. Encounter for medication management   11. Encounter for chronic pain management      Updated Problems: No problems updated.  Plan of Care  Problem-specific:  No problem-specific Assessment & Plan notes found for this encounter.  Larry Goodwin has a current medication list which includes the following long-term medication(s): vitamin d3, lisinopril, omeprazole, sildenafil, magnesium oxide, [START ON 10/30/2021] oxycodone, [START ON 11/29/2021] oxycodone, and [START ON 12/29/2021] oxycodone.  Pharmacotherapy (Medications Ordered): Meds ordered this encounter  Medications   oxyCODONE (OXY IR/ROXICODONE) 5 MG immediate release tablet    Sig: Take 1 tablet (5 mg total) by mouth every 6 (six) hours as needed for severe pain. Must last 30 days    Dispense:  120 tablet    Refill:  0    DO NOT: delete (not duplicate); no partial-fill (will deny script to complete), no refill request (F/U required). DISPENSE: 1 day early if closed on fill date. WARN: No CNS-depressants within 8 hrs of med.   oxyCODONE (OXY IR/ROXICODONE) 5 MG immediate release tablet    Sig: Take 1 tablet (5 mg total) by mouth every 6 (six) hours as needed for severe pain. Must last 30 days    Dispense:  120 tablet    Refill:  0    DO NOT: delete (not duplicate); no partial-fill (will deny script to complete), no refill request (F/U required). DISPENSE: 1 day early if closed on fill date.  WARN: No CNS-depressants within 8 hrs of med.   oxyCODONE (OXY IR/ROXICODONE) 5 MG immediate release tablet    Sig: Take 1 tablet (5 mg total) by mouth every 6 (six) hours as needed for severe pain. Must last 30 days    Dispense:  120 tablet    Refill:  0    DO NOT: delete (not duplicate); no partial-fill (will deny script to complete), no refill request (F/U required). DISPENSE: 1 day early if closed on fill date. WARN: No CNS-depressants within 8 hrs of med.    Orders:  No orders of the defined types were placed in this encounter.  Follow-up plan:   Return in about 13 weeks (around 01/28/2022) for Eval-day (M,W), (F2F), (MM).     Interventional treatment options: Planned, scheduled, and/or pending:   Not at this time.   Under consideration:   Diagnostic right CESI  Diagnostic thoracic ESI  Diagnostic bilateral thoracic facet block  Possible bilateral thoracic facet RFA  Diagnostic bilateral lumbar facet block  Possible bilateral lumbar facet RFA  Diagnostic right sacroiliac joint block  Possible right sacroiliac joint RFA  Diagnostic caudal ESI and epidurogram  Diagnostic right-sided L4-5 TFESI  Diagnostic right IA knee injection  Diagnostic right genicular NB  Possible right genicular RFA    Therapeutic/palliative (PRN):   None at this time    Recent Visits No visits were found meeting these conditions. Showing recent visits within past 90 days and meeting all other requirements Today's Visits Date Type Provider Dept  10/29/21 Office Visit Milinda Pointer, MD [facility]  Showing today's visits and meeting all other requirements Future Appointments No visits were found meeting these conditions. Showing future appointments within next 90 days and meeting all other requirements I discussed the assessment and treatment plan with the patient. The patient was provided an opportunity to ask questions and all were answered. The patient agreed with the plan and  demonstrated an understanding of the instructions.  Patient advised to call back  or seek an in-person evaluation if the symptoms or condition worsens.  Duration of encounter: 30 minutes.  Note by: Gaspar Cola, MD Date: 10/29/2021; Time: 1:14 PM

## 2021-10-29 ENCOUNTER — Ambulatory Visit: Payer: Medicare Other | Attending: Pain Medicine | Admitting: Pain Medicine

## 2021-10-29 ENCOUNTER — Encounter: Payer: Self-pay | Admitting: Pain Medicine

## 2021-10-29 ENCOUNTER — Other Ambulatory Visit: Payer: Self-pay

## 2021-10-29 VITALS — BP 136/97 | HR 97 | Temp 97.3°F | Resp 16 | Ht 74.0 in | Wt 320.0 lb

## 2021-10-29 DIAGNOSIS — M549 Dorsalgia, unspecified: Secondary | ICD-10-CM

## 2021-10-29 DIAGNOSIS — G894 Chronic pain syndrome: Secondary | ICD-10-CM

## 2021-10-29 DIAGNOSIS — Z79899 Other long term (current) drug therapy: Secondary | ICD-10-CM | POA: Diagnosis present

## 2021-10-29 DIAGNOSIS — M545 Low back pain, unspecified: Secondary | ICD-10-CM | POA: Diagnosis not present

## 2021-10-29 DIAGNOSIS — M25511 Pain in right shoulder: Secondary | ICD-10-CM | POA: Diagnosis present

## 2021-10-29 DIAGNOSIS — M25561 Pain in right knee: Secondary | ICD-10-CM

## 2021-10-29 DIAGNOSIS — M25512 Pain in left shoulder: Secondary | ICD-10-CM | POA: Diagnosis present

## 2021-10-29 DIAGNOSIS — M25551 Pain in right hip: Secondary | ICD-10-CM | POA: Diagnosis not present

## 2021-10-29 DIAGNOSIS — Z79891 Long term (current) use of opiate analgesic: Secondary | ICD-10-CM

## 2021-10-29 DIAGNOSIS — G8929 Other chronic pain: Secondary | ICD-10-CM | POA: Diagnosis not present

## 2021-10-29 DIAGNOSIS — M533 Sacrococcygeal disorders, not elsewhere classified: Secondary | ICD-10-CM

## 2021-10-29 MED ORDER — OXYCODONE HCL 5 MG PO TABS: 5.0000 mg | ORAL_TABLET | Freq: Four times a day (QID) | ORAL | 0 refills | Status: DC | PRN

## 2021-10-29 NOTE — Patient Instructions (Signed)
____________________________________________________________________________________________  Medication Rules  Purpose: To inform patients, and their family members, of our rules and regulations.  Applies to: All patients receiving prescriptions (written or electronic).  Pharmacy of record: Pharmacy where electronic prescriptions will be sent. If written prescriptions are taken to a different pharmacy, please inform the nursing staff. The pharmacy listed in the electronic medical record should be the one where you would like electronic prescriptions to be sent.  Electronic prescriptions: In compliance with the [place] Strengthen Opioid Misuse Prevention (STOP) Act of 2017 (Session Law 2017-74/H243), effective December 14, 2018, all controlled substances must be electronically prescribed. Calling prescriptions to the pharmacy will cease to exist.  Prescription refills: Only during scheduled appointments. Applies to all prescriptions.  NOTE: The following applies primarily to controlled substances (Opioid* Pain Medications).   Type of encounter (visit): For patients receiving controlled substances, face-to-face visits are required. (Not an option or up to the patient.)  Patient's responsibilities: Pain Pills: Bring all pain pills to every appointment (except for procedure appointments). Pill Bottles: Bring pills in original pharmacy bottle. Always bring the newest bottle. Bring bottle, even if empty. Medication refills: You are responsible for knowing and keeping track of what medications you take and those you need refilled. The day before your appointment: write a list of all prescriptions that need to be refilled. The day of the appointment: give the list to the admitting nurse. Prescriptions will be written only during appointments. No prescriptions will be written on procedure days. If you forget a medication: it will not be "Called in", "Faxed", or "electronically sent". You will  need to get another appointment to get these prescribed. No early refills. Do not call asking to have your prescription filled early. Prescription Accuracy: You are responsible for carefully inspecting your prescriptions before leaving our office. Have the discharge nurse carefully go over each prescription with you, before taking them home. Make sure that your name is accurately spelled, that your address is correct. Check the name and dose of your medication to make sure it is accurate. Check the number of pills, and the written instructions to make sure they are clear and accurate. Make sure that you are given enough medication to last until your next medication refill appointment. Taking Medication: Take medication as prescribed. When it comes to controlled substances, taking less pills or less frequently than prescribed is permitted and encouraged. Never take more pills than instructed. Never take medication more frequently than prescribed.  Inform other Doctors: Always inform, all of your healthcare providers, of all the medications you take. Pain Medication from other Providers: You are not allowed to accept any additional pain medication from any other Doctor or Healthcare provider. There are two exceptions to this rule. (see below) In the event that you require additional pain medication, you are responsible for notifying us, as stated below. Cough Medicine: Often these contain an opioid, such as codeine or hydrocodone. Never accept or take cough medicine containing these opioids if you are already taking an opioid* medication. The combination may cause respiratory failure and death. Medication Agreement: You are responsible for carefully reading and following our Medication Agreement. This must be signed before receiving any prescriptions from our practice. Safely store a copy of your signed Agreement. Violations to the Agreement will result in no further prescriptions. (Additional copies of our  Medication Agreement are available upon request.) Laws, Rules, & Regulations: All patients are expected to follow all Federal and State Laws, Statutes, Rules, & Regulations. Ignorance of   the Laws does not constitute a valid excuse.  Illegal drugs and Controlled Substances: The use of illegal substances (including, but not limited to marijuana and its derivatives) and/or the illegal use of any controlled substances is strictly prohibited. Violation of this rule may result in the immediate and permanent discontinuation of any and all prescriptions being written by our practice. The use of any illegal substances is prohibited. Adopted CDC guidelines & recommendations: Target dosing levels will be at or below 60 MME/day. Use of benzodiazepines** is not recommended.  Exceptions: There are only two exceptions to the rule of not receiving pain medications from other Healthcare Providers. Exception #1 (Emergencies): In the event of an emergency (i.e.: accident requiring emergency care), you are allowed to receive additional pain medication. However, you are responsible for: As soon as you are able, call our office [phone], at any time of the day or night, and leave a message stating your name, the date and nature of the emergency, and the name and dose of the medication prescribed. In the event that your call is answered by a member of our staff, make sure to document and save the date, time, and the name of the person that took your information.  Exception #2 (Planned Surgery): In the event that you are scheduled by another doctor or dentist to have any type of surgery or procedure, you are allowed (for a period no longer than 30 days), to receive additional pain medication, for the acute post-op pain. However, in this case, you are responsible for picking up a copy of our "Post-op Pain Management for Surgeons" handout, and giving it to your surgeon or dentist. This document is available at our office, and  does not require an appointment to obtain it. Simply go to our office during business hours (Monday-Thursday from 8:00 AM to 4:00 PM) (Friday 8:00 AM to 12:00 Noon) or if you have a scheduled appointment with us, prior to your surgery, and ask for it by name. In addition, you are responsible for: calling our office [phone], at any time of the day or night, and leaving a message stating your name, name of your surgeon, type of surgery, and date of procedure or surgery. Failure to comply with your responsibilities may result in termination of therapy involving the controlled substances. Medication Agreement Violation. Following the above rules, including your responsibilities will help you in avoiding a Medication Agreement Violation ("Breaking your Pain Medication Contract").  *Opioid medications include: morphine, codeine, oxycodone, oxymorphone, hydrocodone, hydromorphone, meperidine, tramadol, tapentadol, buprenorphine, fentanyl, methadone. **Benzodiazepine medications include: diazepam (Valium), alprazolam (Xanax), clonazepam (Klonopine), lorazepam (Ativan), clorazepate (Tranxene), chlordiazepoxide (Librium), estazolam (Prosom), oxazepam (Serax), temazepam (Restoril), triazolam (Halcion) (Last updated: 09/10/2021) ____________________________________________________________________________________________  ____________________________________________________________________________________________  Medication Recommendations and Reminders  Applies to: All patients receiving prescriptions (written and/or electronic).  Medication Rules & Regulations: These rules and regulations exist for your safety and that of others. They are not flexible and neither are we. Dismissing or ignoring them will be considered "non-compliance" with medication therapy, resulting in complete and irreversible termination of such therapy. (See document titled "Medication Rules" for more details.) In all conscience,  because of safety reasons, we cannot continue providing a therapy where the patient does not follow instructions.  Pharmacy of record:  Definition: This is the pharmacy where your electronic prescriptions will be sent.  We do not endorse any particular pharmacy, however, we have experienced problems with [facility] not securing enough medication supply for the community. We do not restrict you   in your choice of pharmacy. However, once we write for your prescriptions, we will NOT be re-sending more prescriptions to fix restricted supply problems created by your pharmacy, or your insurance.  The pharmacy listed in the electronic medical record should be the one where you want electronic prescriptions to be sent. If you choose to change pharmacy, simply notify our nursing staff.  Recommendations: Keep all of your pain medications in a safe place, under lock and key, even if you live alone. We will NOT replace lost, stolen, or damaged medication. After you fill your prescription, take 1 week's worth of pills and put them away in a safe place. You should keep a separate, properly labeled bottle for this purpose. The remainder should be kept in the original bottle. Use this as your primary supply, until it runs out. Once it's gone, then you know that you have 1 week's worth of medicine, and it is time to come in for a prescription refill. If you do this correctly, it is unlikely that you will ever run out of medicine. To make sure that the above recommendation works, it is very important that you make sure your medication refill appointments are scheduled at least 1 week before you run out of medicine. To do this in an effective manner, make sure that you do not leave the office without scheduling your next medication management appointment. Always ask the nursing staff to show you in your prescription , when your medication will be running out. Then arrange for the receptionist to get you a return appointment,  at least 7 days before you run out of medicine. Do not wait until you have 1 or 2 pills left, to come in. This is very poor planning and does not take into consideration that we may need to cancel appointments due to bad weather, sickness, or emergencies affecting our staff. DO NOT ACCEPT A "Partial Fill": If for any reason your pharmacy does not have enough pills/tablets to completely fill or refill your prescription, do not allow for a "partial fill". The law allows the pharmacy to complete that prescription within 72 hours, without requiring a new prescription. If they do not fill the rest of your prescription within those 72 hours, you will need a separate prescription to fill the remaining amount, which we will NOT provide. If the reason for the partial fill is your insurance, you will need to talk to the pharmacist about payment alternatives for the remaining tablets, but again, DO NOT ACCEPT A PARTIAL FILL, unless you can trust your pharmacist to obtain the remainder of the pills within 72 hours.  Prescription refills and/or changes in medication(s):  Prescription refills, and/or changes in dose or medication, will be conducted only during scheduled medication management appointments. (Applies to both, written and electronic prescriptions.) No refills on procedure days. No medication will be changed or started on procedure days. No changes, adjustments, and/or refills will be conducted on a procedure day. Doing so will interfere with the diagnostic portion of the procedure. No phone refills. No medications will be "called into the pharmacy". No Fax refills. No weekend refills. No Holliday refills. No after hours refills.  Remember:  Business hours are:  Monday to Thursday 8:00 AM to 4:00 PM Provider's Schedule: Johan Creveling, MD - Appointments are:  Medication management: Monday and Wednesday 8:00 AM to 4:00 PM Procedure day: Tuesday and Thursday 7:30 AM to 4:00 PM Bilal Lateef, MD -  Appointments are:  Medication management: Tuesday and Thursday 8:00   AM to 4:00 PM Procedure day: Monday and Wednesday 7:30 AM to 4:00 PM (Last update: 07/03/2020) ____________________________________________________________________________________________

## 2021-10-29 NOTE — Progress Notes (Signed)
Nursing Pain Medication Assessment:  Safety precautions to be maintained throughout the outpatient stay will include: orient to surroundings, keep bed in low position, maintain call bell within reach at all times, provide assistance with transfer out of bed and ambulation.  Medication Inspection Compliance: Pill count conducted under aseptic conditions, in front of the patient. Neither the pills nor the bottle was removed from the patient's sight at any time. Once count was completed pills were immediately returned to the patient in their original bottle.  Medication: Oxycodone IR Pill/Patch Count:  3 of 120 pills remain Pill/Patch Appearance: Markings consistent with prescribed medication Bottle Appearance: Standard pharmacy container. Clearly labeled. Filled Date: 52 / 18 / 2022 Last Medication intake:  Today

## 2021-12-02 ENCOUNTER — Other Ambulatory Visit: Payer: Self-pay

## 2021-12-02 ENCOUNTER — Encounter: Payer: Self-pay | Admitting: Nurse Practitioner

## 2021-12-02 ENCOUNTER — Telehealth (INDEPENDENT_AMBULATORY_CARE_PROVIDER_SITE_OTHER): Payer: Medicare Other | Admitting: Nurse Practitioner

## 2021-12-02 VITALS — Wt 300.0 lb

## 2021-12-02 DIAGNOSIS — J0191 Acute recurrent sinusitis, unspecified: Secondary | ICD-10-CM | POA: Diagnosis not present

## 2021-12-02 MED ORDER — AMOXICILLIN-POT CLAVULANATE 875-125 MG PO TABS: 1.0000 | ORAL_TABLET | Freq: Two times a day (BID) | ORAL | 0 refills | Status: AC

## 2021-12-02 NOTE — Assessment & Plan Note (Signed)
Given symptoms and length of illness we will go ahead and treat for sinus infection.  Patient has a history of these in the past.  He continues to have them may benefit from a referral to ENT for further follow-up.  Did go over over-the-counter medications to help with patient symptom management.  Also discussed signs and symptoms when to seek urgent or emergent health care. Start Augmentin 875-125 twice daily for 7 days.

## 2021-12-02 NOTE — Progress Notes (Signed)
Patient ID: STEAVEN WHOLEY, male    DOB: 1975/12/31, 45 y.o.   MRN: [id number]  Virtual visit completed through [place], a video enabled telemedicine application. Due to national recommendations of social distancing due to COVID-19, a virtual visit is felt to be most appropriate for this patient at this time. Reviewed limitations, risks, security and privacy concerns of performing a virtual visit and the availability of in person appointments. I also reviewed that there may be a patient responsible charge related to this service. The patient agreed to proceed.   Patient location: home Provider location: [place] [facility], office Persons participating in this virtual visit: patient, provider   If any vitals were documented, they were collected by patient at home unless specified below.    Wt 300 lb (136.1 kg)    BMI 38.52 kg/m    CC: Sinus Sympotms Subjective:   HPI: Larry Goodwin is a 45 y.o. male presenting on 12/02/2021 for Nasal Congestion (All sx started on Sunday. Tested neg. For covid and flu yesterday. ), Cough, Headache, Fever (99.5 oral this morning ), and Generalized Body Aches  Symptoms start on Wednesday, first symptoms approx 6 days ago. Covid yesterday at home was negative. Flu at urgent care yesterday that was negative.  Not vaccinated against covid No sick contact Has taken tylenol a few times Symptoms feel like they are worsening worse.   Relevant past medical, surgical, family and social history reviewed and updated as indicated. Interim medical history since our last visit reviewed. Allergies and medications reviewed and updated. Outpatient Medications Prior to Visit  Medication Sig Dispense Refill   Cholecalciferol (VITAMIN D3) 1.25 MG (50000 UT) CAPS Take by mouth in the morning and at bedtime.     lisinopril (ZESTRIL) 40 MG tablet Take 1 tablet (40 mg total) by mouth daily. 90 tablet 3   Magnesium Oxide 500 MG CAPS Take 1 capsule (500 mg  total) by mouth daily. 30 capsule 2   omeprazole (PRILOSEC) 40 MG capsule Take 1 capsule (40 mg total) by mouth daily. Daily for 3 weeks then as needed 30 capsule 1   oxyCODONE (OXY IR/ROXICODONE) 5 MG immediate release tablet Take 1 tablet (5 mg total) by mouth every 6 (six) hours as needed for severe pain. Must last 30 days 120 tablet 0   [START ON 12/29/2021] oxyCODONE (OXY IR/ROXICODONE) 5 MG immediate release tablet Take 1 tablet (5 mg total) by mouth every 6 (six) hours as needed for severe pain. Must last 30 days 120 tablet 0   sildenafil (VIAGRA) 100 MG tablet Take 0.5-1 tablets (50-100 mg total) by mouth daily as needed for erectile dysfunction. 5 tablet 3   vitamin B-12 (CYANOCOBALAMIN) 1000 MCG tablet Take 1,000 mcg by mouth daily.     oxyCODONE (OXY IR/ROXICODONE) 5 MG immediate release tablet Take 1 tablet (5 mg total) by mouth every 6 (six) hours as needed for severe pain. Must last 30 days 120 tablet 0   No facility-administered medications prior to visit.     Per HPI unless specifically indicated in ROS section below Review of Systems  Constitutional:  Positive for chills, fatigue and fever.  HENT:  Positive for congestion, ear pain (fluid and pressure), sinus pressure, sinus pain and sore throat (with cough).   Respiratory:  Positive for cough (Thick and green). Negative for shortness of breath.   Cardiovascular:  Negative for chest pain.  Gastrointestinal:  Positive for diarrhea. Negative for abdominal pain, nausea and vomiting.  Musculoskeletal:  Positive for arthralgias and myalgias.  Neurological:  Positive for dizziness. Negative for headaches.  Objective:  Wt 300 lb (136.1 kg)    BMI 38.52 kg/m   Wt Readings from Last 3 Encounters:  12/02/21 300 lb (136.1 kg)  10/29/21 (!) 320 lb (145.2 kg)  07/30/21 (!) 320 lb (145.2 kg)       Physical exam: Gen: alert, NAD, not ill appearing Pulm: speaks in complete sentences without increased work of breathing Psych: normal  mood, normal thought content      Results for orders placed or performed in visit on 04/30/21  ToxASSURE Select 13 (MW), Urine  Result Value Ref Range   Summary Note    Assessment & Plan:   Problem List Items Addressed This Visit       Respiratory   Acute sinusitis - Primary    Given symptoms and length of illness we will go ahead and treat for sinus infection.  Patient has a history of these in the past.  He continues to have them may benefit from a referral to ENT for further follow-up.  Did go over over-the-counter medications to help with patient symptom management.  Also discussed signs and symptoms when to seek urgent or emergent health care. Start Augmentin 875-125 twice daily for 7 days.      Relevant Medications   amoxicillin-clavulanate (AUGMENTIN) 875-125 MG tablet     No orders of the defined types were placed in this encounter.  No orders of the defined types were placed in this encounter.   I discussed the assessment and treatment plan with the patient. The patient was provided an opportunity to ask questions and all were answered. The patient agreed with the plan and demonstrated an understanding of the instructions. The patient was advised to call back or seek an in-person evaluation if the symptoms worsen or if the condition fails to improve as anticipated.  Follow up plan: No follow-ups on file.  [name], NP

## 2021-12-02 NOTE — Patient Instructions (Signed)
Can take 2 Tablets of tylenol 325mg  every 6 hours OR 2 tabs of tylenol 500mg  every 8 hours as needed. Does not have to be on a schedule.

## 2021-12-26 NOTE — Progress Notes (Signed)
Subjective:   Larry Goodwin is a 46 y.o. male who presents for Medicare Annual/Subsequent preventive examination.  I connected with Norva Riffle today by telephone and verified that I am speaking with the correct person using two identifiers. Location patient: home Location provider: work Persons participating in the virtual visit: patient, Marine scientist.    I discussed the limitations, risks, security and privacy concerns of performing an evaluation and management service by telephone and the availability of in person appointments. I also discussed with the patient that there may be a patient responsible charge related to this service. The patient expressed understanding and verbally consented to this telephonic visit.    Interactive audio and video telecommunications were attempted between this provider and patient, however failed, due to patient having technical difficulties OR patient did not have access to video capability.  We continued and completed visit with audio only.  Some vital signs may be absent or patient reported.   Time Spent with patient on telephone encounter: 20 minutes  Review of Systems     Cardiac Risk Factors include: advanced age (>57men, >28 women);hypertension     Objective:    Today's Vitals   12/29/21 1313  Weight: 300 lb (136.1 kg)  Height: 6\' 2"  (1.88 m)  PainSc: 3    Body mass index is 38.52 kg/m.  Advanced Directives 12/29/2021 12/27/2020 02/12/2020 07/17/2019 07/12/2018 07/09/2018 05/02/2018  Does Patient Have a Medical Advance Directive? No No No No No No No  Would patient like information on creating a medical advance directive? Yes (MAU/Ambulatory/Procedural Areas - Information given) No - Patient declined - - No - Patient declined No - Patient declined No - Patient declined    Current Medications (verified) Outpatient Encounter Medications as of 12/29/2021  Medication Sig   Cholecalciferol (VITAMIN D3) 1.25 MG (50000 UT) CAPS Take by mouth in  the morning and at bedtime.   lisinopril (ZESTRIL) 40 MG tablet Take 1 tablet (40 mg total) by mouth daily.   omeprazole (PRILOSEC) 40 MG capsule Take 1 capsule (40 mg total) by mouth daily. Daily for 3 weeks then as needed   oxyCODONE (OXY IR/ROXICODONE) 5 MG immediate release tablet Take 1 tablet (5 mg total) by mouth every 6 (six) hours as needed for severe pain. Must last 30 days   oxyCODONE (OXY IR/ROXICODONE) 5 MG immediate release tablet Take 1 tablet (5 mg total) by mouth every 6 (six) hours as needed for severe pain. Must last 30 days   sildenafil (VIAGRA) 100 MG tablet Take 0.5-1 tablets (50-100 mg total) by mouth daily as needed for erectile dysfunction.   vitamin B-12 (CYANOCOBALAMIN) 1000 MCG tablet Take 1,000 mcg by mouth daily.   Magnesium Oxide 500 MG CAPS Take 1 capsule (500 mg total) by mouth daily.   No facility-administered encounter medications on file as of 12/29/2021.    Allergies (verified) Levaquin [levofloxacin in d5w]   History: Past Medical History:  Diagnosis Date   Bell's palsy 2015   right then left sides   Chronic pain syndrome    s/p 2 back surgeries [facility])   DDD (degenerative disc disease), lumbar    severe s/p 2 surgeries   HTN (hypertension)    Morbid obesity ([place]) 08/16/2014   Past Surgical History:  Procedure Laterality Date   BACK SURGERY  2012   x 2 [facility])   CHOLECYSTECTOMY     Family History  Problem Relation Age of Onset   COPD Mother  smoker (deceased at 59)   Hypertension Other    Stroke Neg Hx    Cancer Neg Hx    Diabetes Neg Hx    CAD Neg Hx    Social History   Socioeconomic History   Marital status: Married    Spouse name: Not on file   Number of children: Not on file   Years of education: Not on file   Highest education level: Not on file  Occupational History   Not on file  Tobacco Use   Smoking status: Former    Types: Cigarettes    Quit date: 12/15/2007    Years since quitting: 14.0   Smokeless tobacco:  Never   Tobacco comments:    quit 6 years ago  Vaping Use   Vaping Use: Never used  Substance and Sexual Activity   Alcohol use: No    Alcohol/week: 0.0 standard drinks   Drug use: Yes    Types: Oxycodone   Sexual activity: Yes  Other Topics Concern   Not on file  Social History Narrative   Lives with wife and 3 children, 2 dogs   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[employer], on disability due to back   Edu: HS   Activity: coaches   Diet: good water, fruits/vegetables daily   Social Determinants of Radio broadcast assistant Strain: Low Risk    Difficulty of Paying Living Expenses: Not hard at all  Food Insecurity: No Food Insecurity   Worried About Charity fundraiser in the Last Year: Never true   Arboriculturist in the Last Year: Never true  Transportation Needs: No Transportation Needs   Lack of Transportation (Medical): No   Lack of Transportation (Non-Medical): No  Physical Activity: Sufficiently Active   Days of Exercise per Week: 3 days   Minutes of Exercise per Session: 60 min  Stress: No Stress Concern Present   Feeling of Stress : Not at all  Social Connections: Moderately Integrated   Frequency of Communication with Friends and Family: More than three times a week   Frequency of Social Gatherings with Friends and Family: More than three times a week   Attends Religious Services: More than 4 times per year   Active Member of [employer] or Organizations: No   Attends Music therapist: Never   Marital Status: Married    Tobacco Counseling Counseling given: Not Answered Tobacco comments: quit 6 years ago   Clinical Intake:  Pre-visit preparation completed: Yes  Pain : 0-10 Pain Score: 3  Pain Location: Back     BMI - recorded: 38.52 Nutritional Status: BMI > 30  Obese Nutritional Risks: None Diabetes: Yes (Patient states not having diabetes) CBG done?: No Did pt. bring in CBG monitor from home?: No  How often do you need to have  someone help you when you read instructions, pamphlets, or other written materials from your doctor or pharmacy?: 1 - Never    Diabetic:   Patient states not being a diabetic and is not monitoring blood sugars.   Interpreter Needed?: No  Information entered by :: Orrin Brigham LPN   Activities of Daily Living In your present state of health, do you have any difficulty performing the following activities: 12/29/2021  Hearing? N  Vision? N  Difficulty concentrating or making decisions? N  Walking or climbing stairs? N  Dressing or bathing? N  Doing errands, shopping? N  Preparing Food and eating ? N  Using the Toilet? N  In the past six months, have you accidently leaked urine? N  Do you have problems with loss of bowel control? N  Managing your Medications? N  Managing your Finances? N  Housekeeping or managing your Housekeeping? N  Some recent data might be hidden    Patient Care Team: Ria Bush, MD as PCP - General (Family Medicine)  Indicate any recent Medical Services you may have received from other than [facility] providers in the past year (date may be approximate).     Assessment:   This is a routine wellness examination for [place].  Hearing/Vision screen Hearing Screening - Comments:: No issues Vision Screening - Comments:: Last exam over a year ago  Dietary issues and exercise activities discussed: Current Exercise Habits: Home exercise routine, Type of exercise: walking, Time (Minutes): 60, Frequency (Times/Week): 3, Weekly Exercise (Minutes/Week): 180, Intensity: Moderate   Goals Addressed             This Visit's Progress    Patient Stated       Would like to walk more       Depression Screen PHQ 2/9 Scores 12/29/2021 04/30/2021 12/27/2020 07/31/2020 07/17/2019 08/02/2018 07/12/2018  PHQ - 2 Score 0 0 0 0 0 0 0  PHQ- 9 Score - - 0 - 0 - 0    Fall Risk Fall Risk  12/29/2021 10/29/2021 07/30/2021 04/30/2021 01/29/2021  Falls in the past year? 0 0 0 0  0  Number falls in past yr: 0 0 - - -  Injury with Fall? 0 0 - - -  Risk for fall due to : - - - - -  Follow up Falls prevention discussed - - - -    FALL RISK PREVENTION PERTAINING TO THE HOME:  Any stairs in or around the home? Yes  If so, are there any without handrails? No  Home free of loose throw rugs in walkways, pet beds, electrical cords, etc? Yes  Adequate lighting in your home to reduce risk of falls? Yes   ASSISTIVE DEVICES UTILIZED TO PREVENT FALLS:  Life alert? No  Use of a cane, walker or w/c? No  Grab bars in the bathroom? Yes  Shower chair or bench in shower? No  Elevated toilet seat or a handicapped toilet? Yes   TIMED UP AND GO:  Was the test performed? No .    Cognitive Function: Normal cognitive status assessed by this Nurse Health Advisor. No abnormalities found.   MMSE - Mini Mental State Exam 12/27/2020 07/17/2019 07/12/2018  Orientation to time 5 5 5   Orientation to Place 5 5 5   Registration 3 3 3   Attention/ Calculation 5 5 0  Recall 3 3 3   Language- name 2 objects - 0 0  Language- repeat 1 1 1   Language- follow 3 step command - 0 3  Language- read & follow direction - 0 0  Write a sentence - 0 0  Copy design - 0 0  Total score - 22 20        Immunizations  There is no immunization history on file for this patient.  TDAP status: Due, Education has been provided regarding the importance of this vaccine. Advised may receive this vaccine at local pharmacy or Health Dept. Aware to provide a copy of the vaccination record if obtained from local pharmacy or Health Dept. Verbalized acceptance and understanding.  Flu Vaccine status: Declined, Education has been provided regarding the importance of this vaccine but patient still declined. Advised may receive this  vaccine at local pharmacy or Health Dept. Aware to provide a copy of the vaccination record if obtained from local pharmacy or Health Dept. Verbalized acceptance and  understanding.  Pneumococcal vaccine status: Due, Education has been provided regarding the importance of this vaccine. Advised may receive this vaccine at local pharmacy or Health Dept. Aware to provide a copy of the vaccination record if obtained from local pharmacy or Health Dept. Verbalized acceptance and understanding.  Covid-19 vaccine status: Declined, Education has been provided regarding the importance of this vaccine but patient still declined. Advised may receive this vaccine at local pharmacy or Health Dept.or vaccine clinic. Aware to provide a copy of the vaccination record if obtained from local pharmacy or Health Dept. Verbalized acceptance and understanding.  Qualifies for Shingles Vaccine? No   Zostavax completed  not yet eligible   Shingrix completed: no yet eligible  Screening Tests Health Maintenance  Topic Date Due   COVID-19 Vaccine (1) Never done   Pneumococcal Vaccine 79-45 Years old (1 - PCV) Never done   OPHTHALMOLOGY EXAM  Never done   Hepatitis C Screening  Never done   FOOT EXAM  07/13/2019   COLONOSCOPY (Pts 45-30yrs Insurance coverage will need to be confirmed)  Never done   HEMOGLOBIN A1C  06/26/2021   INFLUENZA VACCINE  Never done   TETANUS/TDAP  12/27/2025 (Originally 09/07/1994)   HIV Screening  07/12/2049 (Originally 09/07/1990)   HPV VACCINES  Aged Out    Health Maintenance  Health Maintenance Due  Topic Date Due   COVID-19 Vaccine (1) Never done   Pneumococcal Vaccine 30-71 Years old (1 - PCV) Never done   OPHTHALMOLOGY EXAM  Never done   Hepatitis C Screening  Never done   FOOT EXAM  07/13/2019   COLONOSCOPY (Pts 45-54yrs Insurance coverage will need to be confirmed)  Never done   HEMOGLOBIN A1C  06/26/2021   INFLUENZA VACCINE  Never done    Colorectal cancer screening: Patient will discuss with PCP  Lung Cancer Screening: (Low Dose CT Chest recommended if Age 68-80 years, 30 pack-year currently smoking OR have quit w/in 15years.) does  not qualify.     Additional Screening:  Hepatitis C Screening: does qualify  Vision Screening: Recommended annual ophthalmology exams for early detection of glaucoma and other disorders of the eye. Is the patient up to date with their annual eye exam?  No  Who is the provider or what is the name of the office in which the patient attends annual eye exams? Provider information unavailable.   Dental Screening: Recommended annual dental exams for proper oral hygiene  Community Resource Referral / Chronic Care Management: CRR required this visit?  No   CCM required this visit?  No      Plan:     I have personally reviewed and noted the following in the patients chart:   Medical and social history Use of alcohol, tobacco or illicit drugs  Current medications and supplements including opioid prescriptions. Patient is currently taking opioid prescriptions. Information provided to patient regarding non-opioid alternatives. Patient advised to discuss non-opioid treatment plan with their provider. Functional ability and status Nutritional status Physical activity Advanced directives List of other physicians Hospitalizations, surgeries, and ER visits in previous 12 months Vitals Screenings to include cognitive, depression, and falls Referrals and appointments  In addition, I have reviewed and discussed with patient certain preventive protocols, quality metrics, and best practice recommendations. A written personalized care plan for preventive services as well as general preventive  health recommendations were provided to patient.    Due to this being a telephonic visit, the after visit summary with patients personalized plan was offered to patient via mail or my-chart. Patient would like to access on my-chart.   Loma Messing, LPN   0/51/0712   Nurse Health Advisor  Nurse Notes: none

## 2021-12-29 ENCOUNTER — Ambulatory Visit (INDEPENDENT_AMBULATORY_CARE_PROVIDER_SITE_OTHER): Payer: Medicare Other

## 2021-12-29 ENCOUNTER — Other Ambulatory Visit: Payer: Self-pay | Admitting: Family Medicine

## 2021-12-29 VITALS — Ht 74.0 in | Wt 300.0 lb

## 2021-12-29 DIAGNOSIS — Z0000 Encounter for general adult medical examination without abnormal findings: Secondary | ICD-10-CM

## 2021-12-29 NOTE — Patient Instructions (Addendum)
[name] , Thank you for taking time to complete your Medicare Wellness Visit. I appreciate your ongoing commitment to your health goals. Please review the following plan we discussed and let me know if I can assist you in the future.   Screening recommendations/referrals: Colonoscopy: Discuss with PCP @ your next visit  Recommended yearly ophthalmology/optometry visit for glaucoma screening and checkup Recommended yearly dental visit for hygiene and checkup  Vaccinations: Influenza vaccine: Due-May obtain vaccine at our office or your local pharmacy. Pneumococcal vaccine: discuss with PCP @ your next visit  Tdap vaccine: Due-May obtain vaccine at your local pharmacy. Shingles vaccine:   -May obtain vaccine at your local pharmacy. Covid-19: newest booster available at your local pharmacy  Advanced directives: information available at your next visit  Conditions/risks identified: see problem list  Next appointment: Follow up in one year for your annual wellness visit. 12/30/22 @ 12:00pm, this will be a telephone visit  Preventive Care 27 Years and Older, Male Preventive care refers to lifestyle choices and visits with your health care provider that can promote health and wellness. What does preventive care include? A yearly physical exam. This is also called an annual well check. Dental exams once or twice a year. Routine eye exams. Ask your health care provider how often you should have your eyes checked. Personal lifestyle choices, including: Daily care of your teeth and gums. Regular physical activity. Eating a healthy diet. Avoiding tobacco and drug use. Limiting alcohol use. Practicing safe sex. Taking low doses of aspirin every day. Taking vitamin and mineral supplements as recommended by your health care provider. What happens during an annual well check? The services and screenings done by your health care provider during your annual well check will depend on your age,  overall health, lifestyle risk factors, and family history of disease. Counseling  Your health care provider may ask you questions about your: Alcohol use. Tobacco use. Drug use. Emotional well-being. Home and relationship well-being. Sexual activity. Eating habits. History of falls. Memory and ability to understand (cognition). Work and work Statistician. Screening  You may have the following tests or measurements: Height, weight, and BMI. Blood pressure. Lipid and cholesterol levels. These may be checked every 5 years, or more frequently if you are over 47 years old. Skin check. Lung cancer screening. You may have this screening every year starting at age 47 if you have a 30-pack-year history of smoking and currently smoke or have quit within the past 15 years. Fecal occult blood test (FOBT) of the stool. You may have this test every year starting at age 47. Flexible sigmoidoscopy or colonoscopy. You may have a sigmoidoscopy every 5 years or a colonoscopy every 10 years starting at age 47. Prostate cancer screening. Recommendations will vary depending on your family history and other risks. Hepatitis C blood test. Hepatitis B blood test. Sexually transmitted disease (STD) testing. Diabetes screening. This is done by checking your blood sugar (glucose) after you have not eaten for a while (fasting). You may have this done every 1-3 years. Abdominal aortic aneurysm (AAA) screening. You may need this if you are a current or former smoker. Osteoporosis. You may be screened starting at age 62 if you are at high risk. Talk with your health care provider about your test results, treatment options, and if necessary, the need for more tests. Vaccines  Your health care provider may recommend certain vaccines, such as: Influenza vaccine. This is recommended every year. Tetanus, diphtheria, and acellular pertussis (Tdap,  Td) vaccine. You may need a Td booster every 10 years. Zoster vaccine.  You may need this after age 47. Pneumococcal 13-valent conjugate (PCV13) vaccine. One dose is recommended after age 47. Pneumococcal polysaccharide (PPSV23) vaccine. One dose is recommended after age 47. Talk to your health care provider about which screenings and vaccines you need and how often you need them. This information is not intended to replace advice given to you by your health care provider. Make sure you discuss any questions you have with your health care provider. Document Released: 12/27/2015 Document Revised: 08/19/2016 Document Reviewed: 10/01/2015 Elsevier Interactive Patient Education  2017 [place] Prevention in the Home Falls can cause injuries. They can happen to people of all ages. There are many things you can do to make your home safe and to help prevent falls. What can I do on the outside of my home? Regularly fix the edges of walkways and driveways and fix any cracks. Remove anything that might make you trip as you walk through a door, such as a raised step or threshold. Trim any bushes or trees on the path to your home. Use bright outdoor lighting. Clear any walking paths of anything that might make someone trip, such as rocks or tools. Regularly check to see if handrails are loose or broken. Make sure that both sides of any steps have handrails. Any raised decks and porches should have guardrails on the edges. Have any leaves, snow, or ice cleared regularly. Use sand or salt on walking paths during winter. Clean up any spills in your garage right away. This includes oil or grease spills. What can I do in the bathroom? Use night lights. Install grab bars by the toilet and in the tub and shower. Do not use towel bars as grab bars. Use non-skid mats or decals in the tub or shower. If you need to sit down in the shower, use a plastic, non-slip stool. Keep the floor dry. Clean up any water that spills on the floor as soon as it happens. Remove soap  buildup in the tub or shower regularly. Attach bath mats securely with double-sided non-slip rug tape. Do not have throw rugs and other things on the floor that can make you trip. What can I do in the bedroom? Use night lights. Make sure that you have a light by your bed that is easy to reach. Do not use any sheets or blankets that are too big for your bed. They should not hang down onto the floor. Have a firm chair that has side arms. You can use this for support while you get dressed. Do not have throw rugs and other things on the floor that can make you trip. What can I do in the kitchen? Clean up any spills right away. Avoid walking on wet floors. Keep items that you use a lot in easy-to-reach places. If you need to reach something above you, use a strong step stool that has a grab bar. Keep electrical cords out of the way. Do not use floor polish or wax that makes floors slippery. If you must use wax, use non-skid floor wax. Do not have throw rugs and other things on the floor that can make you trip. What can I do with my stairs? Do not leave any items on the stairs. Make sure that there are handrails on both sides of the stairs and use them. Fix handrails that are broken or loose. Make sure that handrails are as  long as the stairways. Check any carpeting to make sure that it is firmly attached to the stairs. Fix any carpet that is loose or worn. Avoid having throw rugs at the top or bottom of the stairs. If you do have throw rugs, attach them to the floor with carpet tape. Make sure that you have a light switch at the top of the stairs and the bottom of the stairs. If you do not have them, ask someone to add them for you. What else can I do to help prevent falls? Wear shoes that: Do not have high heels. Have rubber bottoms. Are comfortable and fit you well. Are closed at the toe. Do not wear sandals. If you use a stepladder: Make sure that it is fully opened. Do not climb a closed  stepladder. Make sure that both sides of the stepladder are locked into place. Ask someone to hold it for you, if possible. Clearly mark and make sure that you can see: Any grab bars or handrails. First and last steps. Where the edge of each step is. Use tools that help you move around (mobility aids) if they are needed. These include: Canes. Walkers. Scooters. Crutches. Turn on the lights when you go into a dark area. Replace any light bulbs as soon as they burn out. Set up your furniture so you have a clear path. Avoid moving your furniture around. If any of your floors are uneven, fix them. If there are any pets around you, be aware of where they are. Review your medicines with your doctor. Some medicines can make you feel dizzy. This can increase your chance of falling. Ask your doctor what other things that you can do to help prevent falls. This information is not intended to replace advice given to you by your health care provider. Make sure you discuss any questions you have with your health care provider. Document Released: 09/26/2009 Document Revised: 05/07/2016 Document Reviewed: 01/04/2015 Elsevier Interactive Patient Education  2017 [employer].

## 2021-12-30 NOTE — Telephone Encounter (Signed)
E-scribed refill.  Plz schedule CPE.

## 2021-12-30 NOTE — Telephone Encounter (Signed)
Could not lvm but I sent my chart letter(tbuck)

## 2022-01-18 NOTE — Progress Notes (Signed)
PROVIDER NOTE: Information contained herein reflects review and annotations entered in association with encounter. Interpretation of such information and data should be left to medically-trained personnel. Information provided to patient can be located elsewhere in the medical record under "Patient Instructions". Document created using STT-dictation technology, any transcriptional errors that may result from process are unintentional.    Patient: Larry Goodwin  Service Category: E/M  Provider: Gaspar Cola, MD  DOB: [birth year]  DOS: 01/19/2022  Specialty: Interventional Pain Management  MRN: [id number]  Setting: Ambulatory outpatient  PCP: Ria Bush, MD  Type: Established Patient    Referring Provider: Ria Bush, MD  Location: Office  Delivery: Face-to-face     HPI  [name], a 46 y.o. year old male, is here today because of his Chronic pain syndrome [G89.4]. [name]'s primary complain today is Back Pain (lower) Last encounter: My last encounter with him was on 10/29/2021. Pertinent problems: 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 Pain Assessment: Severity of Chronic pain is reported as a 3 /10. Location: Back Lower/right buttock. Onset: More than a month ago. Quality: Constant. Timing: Constant. Modifying factor(s): walking, lying down. Vitals:  height is '6\' 2"'  (1.88 m) and weight is 300 lb (136.1 kg). His temporal temperature is 98.6 F (37 C). His blood pressure is 130/90 and his pulse is 95. His respiration is 18 and oxygen saturation is 96%.   Reason for encounter: medication management.   The patient indicates doing well with the current medication regimen. No adverse reactions or side effects reported to the medications.   RTCB: 04/28/2022 Nonopioids transfer 10/28/2020: Magnesium (OTC)  Pharmacotherapy Assessment  Analgesic: Oxycodone IR 5 mg, 1 tab PO q 6 hrs (20 mg/day of oxycodone) MME/day: 30 mg/day.   Monitoring: [place] PMP: PDMP reviewed during this encounter.       Pharmacotherapy: No side-effects or adverse reactions reported. Compliance: No problems identified. Effectiveness: Clinically acceptable.  [name], RN  01/19/2022  1:38 PM  Sign when Signing Visit Nursing Pain Medication Assessment:  Safety precautions to be maintained throughout the outpatient stay will include: orient to surroundings, keep bed in low position, maintain call bell within reach at all times, provide assistance with transfer out of bed and ambulation.  Medication Inspection Compliance: Pill count conducted under aseptic conditions, in front of the patient. Neither the pills nor the bottle was removed from the patient's sight at any time. Once count was completed pills were immediately returned to the patient in their original bottle.  Medication: Oxycodone  IR Pill/Patch Count:  39 of 120 pills remain Pill/Patch Appearance: Markings consistent with prescribed  medication Bottle Appearance: Standard pharmacy container. Clearly labeled. Filled Date: 01 / 17 / 2023 Last Medication intake:  Today    UDS:  Summary  Date Value Ref Range Status  04/30/2021 Note  Final    Comment:    ==================================================================== [employer] Select 13 (MW) ==================================================================== Test                             Result       Flag       Units  Drug Present and Declared for Prescription Verification   Oxycodone                      806          EXPECTED   ng/mg creat   Oxymorphone                    778          EXPECTED   ng/mg creat   Noroxycodone                   1343         EXPECTED   ng/mg creat   Noroxymorphone                 246          EXPECTED   ng/mg creat    Sources of oxycodone are scheduled prescription medications.    Oxymorphone, noroxycodone, and noroxymorphone are expected    metabolites of oxycodone. Oxymorphone is also available as a    scheduled prescription medication.  ==================================================================== Test                      Result    Flag   Units      Ref Range   Creatinine              54               mg/dL      >=20 ==================================================================== Declared Medications:  The flagging and interpretation on this report are based on the  following declared medications.  Unexpected results may arise from  inaccuracies in the declared medications.   **Note: The testing scope of this panel includes these medications:   Oxycodone   **Note: The testing scope of this panel does not include the  following reported medications:   Cyanocobalamin  Lisinopril  Magnesium (Mag-Ox)  Omeprazole  Sildenafil  Vitamin  D3 ==================================================================== For clinical consultation, please call [phone]. ====================================================================      ROS  Constitutional: Denies any fever or chills Gastrointestinal: No reported hemesis, hematochezia, vomiting, or acute GI distress Musculoskeletal: Denies any acute onset joint swelling, redness, loss of ROM, or weakness Neurological: No reported episodes of acute onset apraxia, aphasia, dysarthria, agnosia, amnesia, paralysis, loss of coordination, or loss of consciousness  Medication Review  Magnesium Oxide, Vitamin D3, lisinopril, omeprazole, oxyCODONE, sildenafil, and vitamin B-12  History Review  Allergy: [name] is allergic to levaquin [levofloxacin in d5w]. Drug: [name]  reports current drug use. Drug: Oxycodone. Alcohol:  reports no history of alcohol use. Tobacco:  reports that he quit smoking about 14 years ago. His smoking use included cigarettes. He has never used smokeless tobacco. Social: [name]  reports that he quit smoking about 14 years ago. His smoking use included cigarettes. He has never used smokeless tobacco. He reports  current drug use. Drug: Oxycodone. He reports that he does not drink alcohol. Medical:  has a past medical history of Bell's palsy (2015), Chronic pain syndrome, DDD (degenerative disc disease), lumbar, HTN (hypertension), and Morbid obesity ([place]) (08/16/2014). Surgical: [name]  has a past surgical history that includes Back surgery (2012) and Cholecystectomy. Family: family history includes COPD in his mother; Hypertension in an other family member.  Laboratory Chemistry Profile   Renal Lab Results  Component Value Date   BUN 13 12/27/2020   CREATININE 0.80 93/23/5573   BCR NOT APPLICABLE 22/01/5426   GFR 97.98 07/19/2019   GFRAA 129 02/06/2019   GFRNONAA 112 02/06/2019    Hepatic Lab Results  Component Value Date   AST 13  12/27/2020   ALT 12 12/27/2020   ALBUMIN 4.3 07/19/2019   ALKPHOS 45 07/19/2019   LIPASE 153 09/21/2012    Electrolytes Lab Results  Component Value Date   NA 138 12/27/2020   K 4.7 12/27/2020   CL 101 12/27/2020   CALCIUM 8.8 12/27/2020   MG 1.8 12/27/2020    Bone Lab Results  Component Value Date   VD25OH 47 12/27/2020   25OHVITD1 35 02/06/2019   25OHVITD2 <1.0 02/06/2019   25OHVITD3 34 02/06/2019    Inflammation (CRP: Acute Phase) (ESR: Chronic Phase) Lab Results  Component Value Date   CRP 9 02/06/2019   ESRSEDRATE 6 02/06/2019         Note: Above Lab results reviewed.  Recent Imaging Review  DG Foot Complete Right CLINICAL DATA:  Jarring foot 3 days prior, no fall  EXAM: RIGHT FOOT COMPLETE - 3+ VIEW  COMPARISON:  Radiograph 03/24/2012  FINDINGS: No acute bony abnormality. Specifically, no fracture, subluxation, or dislocation. Mild degenerative changes are noted in the midfoot. Posterior calcaneal spur is present. Trace ankle effusion. Soft tissues are otherwise unremarkable  IMPRESSION: No acute fracture or traumatic malalignment.  Mild degenerative changes in the midfoot.  Posterior calcaneal spur.  Trace ankle effusion.  Electronically Signed   By: Lovena Le M.D.   On: 02/12/2020 19:03 Note: Reviewed        Physical Exam  General appearance: Well nourished, well developed, and well hydrated. In no apparent acute distress Mental status: Alert, oriented x 3 (person, place, & time)       Respiratory: No evidence of acute respiratory distress Eyes: PERLA Vitals: BP 130/90    Pulse 95    Temp 98.6 F (37 C) (Temporal)    Resp 18    Ht '6\' 2"'  (1.88 m)    Wt 300 lb (136.1 kg)    SpO2 96%    BMI 38.52 kg/m  BMI: Estimated body mass index is 38.52 kg/m as calculated from the following:   Height as of this encounter: '6\' 2"'  (1.88 m).   Weight as of this encounter: 300 lb (136.1 kg). Ideal: Ideal body weight: 82.2 kg (181 lb 3.5 oz) Adjusted  ideal body weight: 103.8 kg (228 lb 11.7 oz)  Assessment   Status Diagnosis  Controlled Controlled Controlled 1. Chronic pain syndrome   2. Chronic low back pain (1ry area of Pain) (Bilateral) (R>L)   3. Chronic hip pain (2ry area of Pain) (Right)   4. Chronic knee pain (Right)   5. Chronic sacroiliac joint pain (Right)   6. Chronic upper back pain (3ry area of Pain)   7. Chronic shoulder pain (4th area of Pain) (Bilateral) (Right)   8. Pharmacologic therapy   9. Chronic use of  opiate for therapeutic purpose   10. Encounter for medication management   11. Encounter for chronic pain management      Updated Problems: No problems updated.  Plan of Care  Problem-specific:  No problem-specific Assessment & Plan notes found for this encounter.  [name] has a current medication list which includes the following long-term medication(s): vitamin d3, lisinopril, omeprazole, sildenafil, magnesium oxide, [START ON 01/28/2022] oxycodone, [START ON 02/27/2022] oxycodone, and [START ON 03/29/2022] oxycodone.  Pharmacotherapy (Medications Ordered): Meds ordered this encounter  Medications   oxyCODONE (OXY IR/ROXICODONE) 5 MG immediate release tablet    Sig: Take 1 tablet (5 mg total) by mouth every 6 (six) hours as needed for severe pain. Must last 30 days    Dispense:  120 tablet    Refill:  0    DO NOT: delete (not duplicate); no partial-fill (will deny script to complete), no refill request (F/U required). DISPENSE: 1 day early if closed on fill date. WARN: No CNS-depressants within 8 hrs of med.   oxyCODONE (OXY IR/ROXICODONE) 5 MG immediate release tablet    Sig: Take 1 tablet (5 mg total) by mouth every 6 (six) hours as needed for severe pain. Must last 30 days    Dispense:  120 tablet    Refill:  0    DO NOT: delete (not duplicate); no partial-fill (will deny script to complete), no refill request (F/U required). DISPENSE: 1 day early if closed on fill date. WARN: No  CNS-depressants within 8 hrs of med.   oxyCODONE (OXY IR/ROXICODONE) 5 MG immediate release tablet    Sig: Take 1 tablet (5 mg total) by mouth every 6 (six) hours as needed for severe pain. Must last 30 days    Dispense:  120 tablet    Refill:  0    DO NOT: delete (not duplicate); no partial-fill (will deny script to complete), no refill request (F/U required). DISPENSE: 1 day early if closed on fill date. WARN: No CNS-depressants within 8 hrs of med.   Orders:  No orders of the defined types were placed in this encounter.  Follow-up plan:   Return in about 3 months (around 04/28/2022) for Eval-day (M,W), (F2F), (MM).     Interventional Therapies  Risk   Complexity Considerations:   Estimated body mass index is 38.52 kg/m as calculated from the following:   Height as of this encounter: '6\' 2"'  (1.88 m).   Weight as of this encounter: 300 lb (136.1 kg). WNL   Planned   Pending:      Under consideration:   None at this time.   Completed:   None since joining practice in 02/06/2019   Therapeutic   Palliative (PRN) options:   None established    Recent Visits Date Type Provider Dept  10/29/21 Office Visit Milinda Pointer, MD [facility]  Showing recent visits within past 90 days and meeting all other requirements Today's Visits Date Type Provider Dept  01/19/22 Office Visit Milinda Pointer, MD [facility]  Showing today's visits and meeting all other requirements Future Appointments No visits were found meeting these conditions. Showing future appointments within next 90 days and meeting all other requirements  I discussed the assessment and treatment plan with the patient. The patient was provided an opportunity to ask questions and all were answered. The patient agreed with the plan and demonstrated an understanding of the instructions.  Patient advised to call back or seek an in-person evaluation if the symptoms  or condition worsens.  Duration of  encounter: 30 minutes.  Note by: Gaspar Cola, MD Date: 01/19/2022; Time: 2:36 PM

## 2022-01-19 ENCOUNTER — Encounter: Payer: Self-pay | Admitting: Pain Medicine

## 2022-01-19 ENCOUNTER — Other Ambulatory Visit: Payer: Self-pay

## 2022-01-19 ENCOUNTER — Ambulatory Visit: Payer: Medicare Other | Attending: Pain Medicine | Admitting: Pain Medicine

## 2022-01-19 VITALS — BP 130/90 | HR 95 | Temp 98.6°F | Resp 18 | Ht 74.0 in | Wt 300.0 lb

## 2022-01-19 DIAGNOSIS — M533 Sacrococcygeal disorders, not elsewhere classified: Secondary | ICD-10-CM | POA: Insufficient documentation

## 2022-01-19 DIAGNOSIS — M25551 Pain in right hip: Secondary | ICD-10-CM | POA: Insufficient documentation

## 2022-01-19 DIAGNOSIS — Z79899 Other long term (current) drug therapy: Secondary | ICD-10-CM | POA: Diagnosis present

## 2022-01-19 DIAGNOSIS — M25511 Pain in right shoulder: Secondary | ICD-10-CM | POA: Insufficient documentation

## 2022-01-19 DIAGNOSIS — M25561 Pain in right knee: Secondary | ICD-10-CM | POA: Insufficient documentation

## 2022-01-19 DIAGNOSIS — M549 Dorsalgia, unspecified: Secondary | ICD-10-CM | POA: Diagnosis present

## 2022-01-19 DIAGNOSIS — G894 Chronic pain syndrome: Secondary | ICD-10-CM | POA: Diagnosis not present

## 2022-01-19 DIAGNOSIS — Z79891 Long term (current) use of opiate analgesic: Secondary | ICD-10-CM | POA: Diagnosis present

## 2022-01-19 DIAGNOSIS — G8929 Other chronic pain: Secondary | ICD-10-CM

## 2022-01-19 DIAGNOSIS — M25512 Pain in left shoulder: Secondary | ICD-10-CM | POA: Diagnosis present

## 2022-01-19 DIAGNOSIS — M545 Low back pain, unspecified: Secondary | ICD-10-CM | POA: Insufficient documentation

## 2022-01-19 MED ORDER — OXYCODONE HCL 5 MG PO TABS: 5.0000 mg | ORAL_TABLET | Freq: Four times a day (QID) | ORAL | 0 refills | Status: DC | PRN

## 2022-01-19 NOTE — Patient Instructions (Signed)

## 2022-01-19 NOTE — Progress Notes (Signed)
Nursing Pain Medication Assessment:  Safety precautions to be maintained throughout the outpatient stay will include: orient to surroundings, keep bed in low position, maintain call bell within reach at all times, provide assistance with transfer out of bed and ambulation.  Medication Inspection Compliance: Pill count conducted under aseptic conditions, in front of the patient. Neither the pills nor the bottle was removed from the patient's sight at any time. Once count was completed pills were immediately returned to the patient in their original bottle.  Medication: Oxycodone IR Pill/Patch Count:  39 of 120 pills remain Pill/Patch Appearance: Markings consistent with prescribed medication Bottle Appearance: Standard pharmacy container. Clearly labeled. Filled Date: 01 / 17 / 2023 Last Medication intake:  Today

## 2022-01-21 ENCOUNTER — Encounter: Payer: Medicare Other | Admitting: Pain Medicine

## 2022-02-24 ENCOUNTER — Telehealth: Payer: Self-pay

## 2022-02-24 NOTE — Telephone Encounter (Signed)
Patient called to schedule a CPE with Dr Darnell Level for 04/13/22 or 04/22/22 because patient will be in town those 2 days and wanted to get as many appointments done as he can while in town then. Patient lives 1 1/2 hours away now but still wants to keep his providers he has been seen. ? ?Dr Darnell Level is it ok to add him on to your schedule for CPE with available spots for those days? Please send response to me instead of Lattie Haw for this and I will call patient.  ?

## 2022-02-25 ENCOUNTER — Telehealth (INDEPENDENT_AMBULATORY_CARE_PROVIDER_SITE_OTHER): Payer: Medicare Other | Admitting: Nurse Practitioner

## 2022-02-25 ENCOUNTER — Other Ambulatory Visit: Payer: Self-pay

## 2022-02-25 DIAGNOSIS — R238 Other skin changes: Secondary | ICD-10-CM | POA: Insufficient documentation

## 2022-02-25 MED ORDER — NYSTATIN 100000 UNIT/GM EX CREA: 1.0000 "application " | TOPICAL_CREAM | Freq: Two times a day (BID) | CUTANEOUS | 0 refills | Status: DC

## 2022-02-25 NOTE — Telephone Encounter (Signed)
Ok to do this/thanks 

## 2022-02-25 NOTE — Telephone Encounter (Signed)
Spoke with patient and got him scheduled for 04/22/22 at 10 am ?

## 2022-02-25 NOTE — Progress Notes (Signed)
? ? Patient ID: Larry Goodwin, male    DOB: 1976/03/02, 46 y.o.   MRN: [id number] ? ?Virtual visit completed through [employer], a video enabled telemedicine application. Due to national recommendations of social distancing due to COVID-19, a virtual visit is felt to be most appropriate for this patient at this time. Reviewed limitations, risks, security and privacy concerns of performing a virtual visit and the availability of in person appointments. I also reviewed that there may be a patient responsible charge related to this service. The patient agreed to proceed.  ? ?Patient location: home ?Provider location: Financial controller at [facility], office ?Persons participating in this virtual visit: patient, provider  ? ?If any vitals were documented, they were collected by patient at home unless specified below.   ? ?There were no vitals taken for this visit.  ? ?CC: Rash/skin irritation  ?Subjective:  ? ?HPI: ?Larry Goodwin is a 46 y.o. male presenting on 02/25/2022 for Skin Problem (Skin irritation on the private area. Noticed it about 4 to 5 days ago. No fever. ) ? ? ?States that a few days ago he was wearing jean  without underwear. States that they went out and it was irratating him. States the next day it was worse. States that it is the skin of the penis on the shaft. States that he has tried gold bond medicated on it and it did not help. States that it made it worse.  ? ?Sates that when he is sitting and waking around it does not bother him. States that when he retracts foreskin it hurts. Described as the skin proximal to the head of his penis. Not having trouble retracting foreskin but it does hurt when he does it. No concern for STI. ? ? ? ? ?Relevant past medical, surgical, family and social history reviewed and updated as indicated. Interim medical history since our last visit reviewed. ?Allergies and medications reviewed and updated. ?Outpatient Medications Prior to Visit  ?Medication Sig Dispense Refill   ? Cholecalciferol (VITAMIN D3) 1.25 MG (50000 UT) CAPS Take by mouth in the morning and at bedtime.    ? lisinopril (ZESTRIL) 40 MG tablet TAKE 1 TABLET BY MOUTH EVERY DAY 90 tablet 0  ? Magnesium Oxide 500 MG CAPS Take 1 capsule (500 mg total) by mouth daily. 30 capsule 2  ? omeprazole (PRILOSEC) 40 MG capsule Take 1 capsule (40 mg total) by mouth daily. Daily for 3 weeks then as needed 30 capsule 1  ? oxyCODONE (OXY IR/ROXICODONE) 5 MG immediate release tablet Take 1 tablet (5 mg total) by mouth every 6 (six) hours as needed for severe pain. Must last 30 days 120 tablet 0  ? [START ON 02/27/2022] oxyCODONE (OXY IR/ROXICODONE) 5 MG immediate release tablet Take 1 tablet (5 mg total) by mouth every 6 (six) hours as needed for severe pain. Must last 30 days 120 tablet 0  ? [START ON 03/29/2022] oxyCODONE (OXY IR/ROXICODONE) 5 MG immediate release tablet Take 1 tablet (5 mg total) by mouth every 6 (six) hours as needed for severe pain. Must last 30 days 120 tablet 0  ? sildenafil (VIAGRA) 100 MG tablet Take 0.5-1 tablets (50-100 mg total) by mouth daily as needed for erectile dysfunction. 5 tablet 3  ? vitamin B-12 (CYANOCOBALAMIN) 1000 MCG tablet Take 1,000 mcg by mouth daily.    ? ?No facility-administered medications prior to visit.  ?  ? ?Per HPI unless specifically indicated in ROS section below ?Review of Systems  ?Constitutional:  Negative for chills and fever.  ?Genitourinary:  Positive for penile pain. Negative for dysuria, penile discharge and penile swelling.  ?Skin:  Positive for rash.  ?Objective:  ?There were no vitals taken for this visit.  ?Wt Readings from Last 3 Encounters:  ?01/19/22 300 lb (136.1 kg)  ?12/29/21 300 lb (136.1 kg)  ?12/02/21 300 lb (136.1 kg)  ?  ?  ? ?Physical exam: ?Gen: alert, NAD, not ill appearing ?Pulm: speaks in complete sentences without increased work of breathing ?Psych: normal mood, normal thought content  ? ?   ?Results for orders placed or performed in visit on 04/30/21   ?ToxASSURE Select 13 (MW), Urine  ?Result Value Ref Range  ? Summary Note   ? ?Assessment & Plan:  ? ?Problem List Items Addressed This Visit   ? ?  ? Other  ? Skin irritation - Primary  ?  Patient went away without wearing underwear when wearing jeans developed skin irritation.  States proximal to the head of his penis.  By description sounds like patient is uncircumcised.  We will start patient on nystatin cream twice daily for 1 week.  Did review signs and symptoms when he is to be seen in immediately.  If he does not have improvement within a week stressed the importance of him being seen in person either urgent care or in office.  Continue to monitor ?  ?  ? Relevant Medications  ? nystatin cream (MYCOSTATIN)  ?  ? ?No orders of the defined types were placed in this encounter. ? ?No orders of the defined types were placed in this encounter. ? ? ?I discussed the assessment and treatment plan with the patient. The patient was provided an opportunity to ask questions and all were answered. The patient agreed with the plan and demonstrated an understanding of the instructions. The patient was advised to call back or seek an in-person evaluation if the symptoms worsen or if the condition fails to improve as anticipated. ? ?Follow up plan: ?No follow-ups on file. ? ?[name], NP   ?

## 2022-02-25 NOTE — Patient Instructions (Addendum)
I put information on the cream and Balanitis. I DO NOT think you have balanitis just some information. Like we discussed if you are not improving in a week you need to be seen in person. Also the signs and symptoms we discussed, if they happen go be seen immediately ?

## 2022-02-25 NOTE — Assessment & Plan Note (Signed)
Patient went away without wearing underwear when wearing jeans developed skin irritation.  States proximal to the head of his penis.  By description sounds like patient is uncircumcised.  We will start patient on nystatin cream twice daily for 1 week.  Did review signs and symptoms when he is to be seen in immediately.  If he does not have improvement within a week stressed the importance of him being seen in person either urgent care or in office.  Continue to monitor ?

## 2022-03-19 ENCOUNTER — Other Ambulatory Visit: Payer: Self-pay | Admitting: Family Medicine

## 2022-03-30 ENCOUNTER — Encounter: Payer: Self-pay | Admitting: Nurse Practitioner

## 2022-03-30 ENCOUNTER — Other Ambulatory Visit: Payer: Self-pay | Admitting: Nurse Practitioner

## 2022-03-30 DIAGNOSIS — R238 Other skin changes: Secondary | ICD-10-CM

## 2022-04-01 MED ORDER — NYSTATIN 100000 UNIT/GM EX CREA: 1.0000 "application " | TOPICAL_CREAM | Freq: Two times a day (BID) | CUTANEOUS | 0 refills | Status: DC

## 2022-04-19 NOTE — Progress Notes (Signed)
PROVIDER NOTE: Information contained herein reflects review and annotations entered in association with encounter. Interpretation of such information and data should be left to medically-trained personnel. Information provided to patient can be located elsewhere in the medical record under "Patient Instructions". Document created using STT-dictation technology, any transcriptional errors that may result from process are unintentional.  ?  ?Patient: Larry Goodwin  Service Category: E/M  Provider: Gaspar Cola, MD  ?DOB: [birth year]  DOS: 04/22/2022  Specialty: Interventional Pain Management  ?MRN: [id number]  Setting: Ambulatory outpatient  PCP: Ria Bush, MD  ?Type: Established Patient    Referring Provider: Ria Bush, MD  ?Location: Office  Delivery: Face-to-face    ? ?HPI  ?Mr. Natale Lay, a 46 y.o. year old male, is here today because of his Chronic pain syndrome [G89.4]. Mr. Louthan's primary complain today is Back Pain (Low and mid) ?Last encounter: My last encounter with him was on 01/19/2022. ?Pertinent problems: Mr. Hamblin has Lumbar DDD (degenerative disc disease); Chronic pain syndrome; Myofascial pain syndrome; Lumbar facet syndrome (Bilateral) (R>L); Chronic upper back pain (3ry area of Pain); Lumbar spondylosis with radiculopathy; Epidural fibrosis; Failed back surgical syndrome; Chronic low back pain (1ry area of Pain) (Bilateral) (R>L); Chronic sacroiliac joint pain (Right); Abnormal MRI, lumbar spine;  Abnormal CT myelogram of the thoracic spine; Thoracic disc herniation; Bulge of lumbar disc without myelopathy; Vertebral body hemangioma; Cervical IVDD (intervertebral disc displacement); Cervical foraminal stenosis (C5-6 and C7-T1: Right); Cervical central spinal stenosis (C5-6); Thoracic IVDD (intervertebral disc displacement); Thoracic facet arthropathy; Thoracic facet syndrome ([place]); Lumbar IVDD (intervertebral disc displacement); Lumbar foraminal stenosis (Right  L4-5); Lumbar central spinal stenosis (L3-4 and L4-5); Chronic knee pain (Right); Osteoarthritis of knee (Right); Chronic hip pain (2ry area of Pain) (Right); Chronic shoulder pain (4th area of Pain) (Bilateral) (Right); and Epigastric pain on their pertinent problem list. ?Pain Assessment: Severity of Chronic pain is reported as a 3 /10. Location: Back Mid, Lower/right buttocks. Onset: More than a month ago. Quality: Constant, Aching, Sharp, Shooting. Timing: Constant. Modifying factor(s): medications, rest, heat at times. ?Vitals:  height is '6\' 2"'$  (1.88 m) and weight is 316 lb (143.3 kg) (abnormal). His temporal temperature is 98.1 ?F (36.7 ?C). His blood pressure is 138/86 and his pulse is 101 (abnormal). His respiration is 18 and oxygen saturation is 96%.  ? ?Reason for encounter: medication management.  The patient indicates doing well with the current medication regimen. No adverse reactions or side effects reported to the medications.   ? ?UDS ordered today.  ? ?RTCB: 06/27/2022 ?Nonopioids transfer 10/28/2020: Magnesium (OTC) ? ?Pharmacotherapy Assessment  ?Analgesic: Oxycodone IR 5 mg, 1 tab PO q 6 hrs (20 mg/day of oxycodone) ?MME/day: 30 mg/day.  ? ?Monitoring: ?[place] PMP: PDMP reviewed during this encounter.       ?Pharmacotherapy: No side-effects or adverse reactions reported. ?Compliance: No problems identified. ?Effectiveness: Clinically acceptable. ? ?[name], RN  04/22/2022 11:56 AM  Signed ?Nursing Pain Medication Assessment:  ?Safety precautions to be maintained throughout the outpatient stay will include: orient to surroundings, keep bed in low position, maintain call bell within reach at all times, provide assistance with transfer out of bed and ambulation.  ?Medication Inspection Compliance: Pill count conducted under aseptic conditions, in front of the patient. Neither the pills nor the bottle was removed from the patient's sight at any time. Once count was completed pills were  immediately returned to the patient in their original bottle. ? ?Medication: Oxycodone IR ?Pill/Patch Count:  35 of 120 pills remain ?Pill/Patch Appearance: Markings consistent with prescribed medication ?Bottle Appearance: Standard pharmacy container. Clearly labeled. ?Filled Date: 04 / 19 / 2023 ?Last Medication intake:  Today ?   UDS:  ?Summary  ?Date Value Ref Range Status  ?04/30/2021 Note  Final  ?  Comment:  ?  ==================================================================== ?ToxASSURE Select 13 (MW) ?==================================================================== ?Test                             Result       Flag       Units ? ?Drug Present and Declared for Prescription Verification ?  Oxycodone                      806          EXPECTED   ng/mg creat ?  Oxymorphone                    778          EXPECTED   ng/mg creat ?  Noroxycodone                   1343         EXPECTED   ng/mg creat ?  Noroxymorphone                 246          EXPECTED   ng/mg creat ?   Sources of oxycodone are scheduled prescription medications. ?   Oxymorphone, noroxycodone, and noroxymorphone are expected ?   metabolites of oxycodone. Oxymorphone is also available as a ?   scheduled prescription medication. ? ?==================================================================== ?Test                      Result    Flag   Units      Ref Range ?  Creatinine              54               mg/dL      >=20 ?==================================================================== ?Declared Medications: ? The flagging and interpretation on this report are based on the ? following declared medications.  Unexpected results may arise from ? inaccuracies in the declared medications. ? ? **Note: The testing scope of this panel includes these medications: ? ? Oxycodone ? ? **Note: The testing scope of this panel does not include the ? following reported medications: ? ? Cyanocobalamin ? Lisinopril ? Magnesium (Mag-Ox) ? Omeprazole ?  Sildenafil ? Vitamin D3 ?==================================================================== ?For clinical consultation, please call [phone]. ?==================================================================== ?  ?  ? ?ROS  ?Constitutional: Denies any fever or chills ?Gastrointestinal: No reported hemesis, hematochezia, vomiting, or acute GI distress ?Musculoskeletal: Denies any acute onset joint swelling, redness, loss of ROM, or weakness ?Neurological: No reported episodes of acute onset apraxia, aphasia, dysarthria, agnosia, amnesia, paralysis, loss of coordination, or loss of consciousness ? ?Medication Review  ?Magnesium Oxide, Vitamin D3, lisinopril, nystatin cream, omeprazole, oxyCODONE, sildenafil, and vitamin B-12 ? ?History Review  ?Allergy: Mr. Westenberger is allergic to levaquin [levofloxacin in d5w]. ?Drug: Mr. Coone  reports current drug use. Drug: Oxycodone. ?Alcohol:  reports no history of alcohol use. ?Tobacco:  reports that he quit smoking about 14 years ago. His smoking use included cigarettes. He has never used smokeless tobacco. ?Social: Mr. Siemen  reports that he quit smoking about 14 years ago.  His smoking use included cigarettes. He has never used smokeless tobacco. He reports current drug use. Drug: Oxycodone. He reports that he does not drink alcohol. ?Medical:  has a past medical history of Bell's palsy (2015), Chronic pain syndrome, DDD (degenerative disc disease), lumbar, HTN (hypertension), and Morbid obesity ([place]) (08/16/2014). ?Surgical: Mr. Fidalgo  has a past surgical history that includes Back surgery (2012) and Cholecystectomy. ?Family: family history includes COPD in his mother; Hypertension in an other family member. ? ?Laboratory Chemistry Profile  ? ?Renal ?Lab Results  ?Component Value Date  ? BUN 13 12/27/2020  ? CREATININE 0.80 12/27/2020  ? BCR NOT APPLICABLE 78/67/5449  ? GFR 97.98 07/19/2019  ? GFRAA 129 02/06/2019  ? GFRNONAA 112 02/06/2019  ?  Hepatic ?Lab  Results  ?Component Value Date  ? AST 13 12/27/2020  ? ALT 12 12/27/2020  ? ALBUMIN 4.3 07/19/2019  ? ALKPHOS 45 07/19/2019  ? LIPASE 153 09/21/2012  ?  ?Electrolytes ?Lab Results  ?Component Value Date  ? NA 138 01/14/202

## 2022-04-22 ENCOUNTER — Ambulatory Visit (INDEPENDENT_AMBULATORY_CARE_PROVIDER_SITE_OTHER): Payer: Medicare Other | Admitting: Family Medicine

## 2022-04-22 ENCOUNTER — Encounter: Payer: Self-pay | Admitting: Pain Medicine

## 2022-04-22 ENCOUNTER — Ambulatory Visit: Payer: Medicare Other | Attending: Pain Medicine | Admitting: Pain Medicine

## 2022-04-22 ENCOUNTER — Encounter: Payer: Self-pay | Admitting: Family Medicine

## 2022-04-22 ENCOUNTER — Other Ambulatory Visit: Payer: Self-pay

## 2022-04-22 VITALS — BP 138/86 | HR 101 | Temp 98.1°F | Resp 18 | Ht 74.0 in | Wt 316.0 lb

## 2022-04-22 VITALS — BP 124/82 | HR 98 | Temp 97.9°F | Ht 72.75 in | Wt 320.5 lb

## 2022-04-22 DIAGNOSIS — M25561 Pain in right knee: Secondary | ICD-10-CM | POA: Diagnosis present

## 2022-04-22 DIAGNOSIS — E118 Type 2 diabetes mellitus with unspecified complications: Secondary | ICD-10-CM | POA: Diagnosis not present

## 2022-04-22 DIAGNOSIS — G894 Chronic pain syndrome: Secondary | ICD-10-CM

## 2022-04-22 DIAGNOSIS — G8929 Other chronic pain: Secondary | ICD-10-CM | POA: Diagnosis present

## 2022-04-22 DIAGNOSIS — I10 Essential (primary) hypertension: Secondary | ICD-10-CM

## 2022-04-22 DIAGNOSIS — M25511 Pain in right shoulder: Secondary | ICD-10-CM | POA: Insufficient documentation

## 2022-04-22 DIAGNOSIS — E559 Vitamin D deficiency, unspecified: Secondary | ICD-10-CM

## 2022-04-22 DIAGNOSIS — Z7189 Other specified counseling: Secondary | ICD-10-CM

## 2022-04-22 DIAGNOSIS — J329 Chronic sinusitis, unspecified: Secondary | ICD-10-CM

## 2022-04-22 DIAGNOSIS — K209 Esophagitis, unspecified without bleeding: Secondary | ICD-10-CM | POA: Diagnosis not present

## 2022-04-22 DIAGNOSIS — M25512 Pain in left shoulder: Secondary | ICD-10-CM | POA: Insufficient documentation

## 2022-04-22 DIAGNOSIS — N528 Other male erectile dysfunction: Secondary | ICD-10-CM

## 2022-04-22 DIAGNOSIS — R0982 Postnasal drip: Secondary | ICD-10-CM | POA: Diagnosis not present

## 2022-04-22 DIAGNOSIS — M25551 Pain in right hip: Secondary | ICD-10-CM | POA: Diagnosis present

## 2022-04-22 DIAGNOSIS — M549 Dorsalgia, unspecified: Secondary | ICD-10-CM | POA: Diagnosis present

## 2022-04-22 DIAGNOSIS — Z79891 Long term (current) use of opiate analgesic: Secondary | ICD-10-CM

## 2022-04-22 DIAGNOSIS — M533 Sacrococcygeal disorders, not elsewhere classified: Secondary | ICD-10-CM | POA: Diagnosis present

## 2022-04-22 DIAGNOSIS — M545 Low back pain, unspecified: Secondary | ICD-10-CM | POA: Insufficient documentation

## 2022-04-22 DIAGNOSIS — Z79899 Other long term (current) drug therapy: Secondary | ICD-10-CM | POA: Diagnosis present

## 2022-04-22 DIAGNOSIS — E538 Deficiency of other specified B group vitamins: Secondary | ICD-10-CM | POA: Diagnosis not present

## 2022-04-22 DIAGNOSIS — R790 Abnormal level of blood mineral: Secondary | ICD-10-CM | POA: Diagnosis not present

## 2022-04-22 LAB — COMPREHENSIVE METABOLIC PANEL
ALT: 17 U/L (ref 0–53)
AST: 14 U/L (ref 0–37)
Albumin: 4.4 g/dL (ref 3.5–5.2)
Alkaline Phosphatase: 63 U/L (ref 39–117)
BUN: 12 mg/dL (ref 6–23)
CO2: 30 mEq/L (ref 19–32)
Calcium: 9.2 mg/dL (ref 8.4–10.5)
Chloride: 96 mEq/L (ref 96–112)
Creatinine, Ser: 0.83 mg/dL (ref 0.40–1.50)
GFR: 104.88 mL/min (ref >60.00)
Glucose, Bld: 379 mg/dL — ABNORMAL HIGH (ref 70–99)
Potassium: 5.1 mEq/L (ref 3.5–5.1)
Sodium: 133 mEq/L — ABNORMAL LOW (ref 135–145)
Total Bilirubin: 0.8 mg/dL (ref 0.2–1.2)
Total Protein: 7.4 g/dL (ref 6.0–8.3)

## 2022-04-22 LAB — CBC WITH DIFFERENTIAL/PLATELET
Basophils Absolute: 0 10*3/uL (ref 0.0–0.1)
Basophils Relative: 0.3 % (ref 0.0–3.0)
Eosinophils Absolute: 0.1 10*3/uL (ref 0.0–0.7)
Eosinophils Relative: 1.4 % (ref 0.0–5.0)
HCT: 44.7 % (ref 39.0–52.0)
Hemoglobin: 15.6 g/dL (ref 13.0–17.0)
Lymphocytes Relative: 19.8 % (ref 12.0–46.0)
Lymphs Abs: 1.3 10*3/uL (ref 0.7–4.0)
MCHC: 34.8 g/dL (ref 30.0–36.0)
MCV: 86.2 fl (ref 78.0–100.0)
Monocytes Absolute: 0.7 10*3/uL (ref 0.1–1.0)
Monocytes Relative: 11 % (ref 3.0–12.0)
Neutro Abs: 4.3 10*3/uL (ref 1.4–7.7)
Neutrophils Relative %: 67.5 % (ref 43.0–77.0)
Platelets: 246 10*3/uL (ref 150.0–400.0)
RBC: 5.19 Mil/uL (ref 4.22–5.81)
RDW: 13.6 % (ref 11.5–15.5)
WBC: 6.4 10*3/uL (ref 4.0–10.5)

## 2022-04-22 LAB — LIPID PANEL
Cholesterol: 191 mg/dL (ref 0–200)
HDL: 48.2 mg/dL (ref >39.00)
LDL Cholesterol: 116 mg/dL — ABNORMAL HIGH (ref 0–99)
NonHDL: 142.41
Total CHOL/HDL Ratio: 4
Triglycerides: 132 mg/dL (ref 0.0–149.0)
VLDL: 26.4 mg/dL (ref 0.0–40.0)

## 2022-04-22 LAB — HEMOGLOBIN A1C: Hgb A1c MFr Bld: 12.2 % — ABNORMAL HIGH (ref 4.6–6.5)

## 2022-04-22 LAB — MICROALBUMIN / CREATININE URINE RATIO
Creatinine,U: 39 mg/dL
Microalb Creat Ratio: 1.8 mg/g (ref 0.0–30.0)
Microalb, Ur: <0.7 mg/dL (ref 0.0–1.9)

## 2022-04-22 LAB — MAGNESIUM: Magnesium: 1.8 mg/dL (ref 1.5–2.5)

## 2022-04-22 LAB — VITAMIN D 25 HYDROXY (VIT D DEFICIENCY, FRACTURES): VITD: 43.95 ng/mL (ref 30.00–100.00)

## 2022-04-22 LAB — VITAMIN B12: Vitamin B-12: 902 pg/mL (ref 211–911)

## 2022-04-22 MED ORDER — OXYCODONE HCL 5 MG PO TABS: 5.0000 mg | ORAL_TABLET | Freq: Four times a day (QID) | ORAL | 0 refills | Status: DC | PRN

## 2022-04-22 MED ORDER — VITAMIN D3 125 MCG (5000 UT) PO CAPS: 5000.0000 [IU] | ORAL_CAPSULE | Freq: Two times a day (BID) | ORAL | Status: AC

## 2022-04-22 MED ORDER — SILDENAFIL CITRATE 100 MG PO TABS: 50.0000 mg | ORAL_TABLET | Freq: Every day | ORAL | 3 refills | Status: DC | PRN

## 2022-04-22 MED ORDER — LISINOPRIL 40 MG PO TABS: 40.0000 mg | ORAL_TABLET | Freq: Every day | ORAL | 3 refills | Status: DC

## 2022-04-22 NOTE — Assessment & Plan Note (Signed)
He thinks he is taking 5000 units twice daily.  I asked him to verify this at home.  Update B12 levels. ?

## 2022-04-22 NOTE — Assessment & Plan Note (Signed)
Advanced directive - does not have this set up. Would want Danielle wife to be HCPOA.  ?

## 2022-04-22 NOTE — Assessment & Plan Note (Signed)
This previously resolved with weight loss.  Update A1c with recent weight gain. ?

## 2022-04-22 NOTE — Assessment & Plan Note (Signed)
Chronic sinus congestion and PNdrainage, h/o recurrent sinus infections. Not taking any medication, has not tried INS. Advised start flonase OTC daily, hold for nosebleeds.  ?Requests ENT referral Larry Reams MD at [facility] in [place] [phone])  ?

## 2022-04-22 NOTE — Assessment & Plan Note (Signed)
Managed by pain clinic 

## 2022-04-22 NOTE — Progress Notes (Signed)
Nursing Pain Medication Assessment:  ?Safety precautions to be maintained throughout the outpatient stay will include: orient to surroundings, keep bed in low position, maintain call bell within reach at all times, provide assistance with transfer out of bed and ambulation.  ?Medication Inspection Compliance: Pill count conducted under aseptic conditions, in front of the patient. Neither the pills nor the bottle was removed from the patient's sight at any time. Once count was completed pills were immediately returned to the patient in their original bottle. ? ?Medication: Oxycodone IR ?Pill/Patch Count:  35 of 120 pills remain ?Pill/Patch Appearance: Markings consistent with prescribed medication ?Bottle Appearance: Standard pharmacy container. Clearly labeled. ?Filled Date: 04 / 19 / 2023 ?Last Medication intake:  Today ?

## 2022-04-22 NOTE — Assessment & Plan Note (Signed)
Trial Viagra sent to pharmacy.  He has not taken this previously. ?

## 2022-04-22 NOTE — Progress Notes (Signed)
? ? Patient ID: Larry Goodwin, male    DOB: [birth year], 46 y.o.   MRN: [id number] ? ?This visit was conducted in person. ? ?BP 124/82   Pulse 98   Temp 97.9 ?F (36.6 ?C) (Temporal)   Ht 6' 0.75" (1.848 m)   Wt (!) 320 lb 8 oz (145.4 kg)   SpO2 97%   BMI 42.58 kg/m?   ? ?CC: AMW f/u visit  ?Subjective:  ? ?HPI: ?Larry Goodwin is a 46 y.o. male presenting on 04/22/2022 for Annual Exam [facility] prt 2. ) ? ? ?New twins at home (02/2021).  ?He is not fasting today.  ? ?Saw health advisor 12/2021 for medicare wellness visit. Note reviewed.  Cognitive assessment not performed.  ? ?No results found.  ?[place] Office Visit from 04/22/2022 in [place]  ?PHQ-2 Total Score 0  ? ?  ?  ? ?  04/22/2022  ? 11:56 AM 01/19/2022  ?  1:37 PM 12/29/2021  ?  1:19 PM 10/29/2021  ?  1:08 PM 07/30/2021  ? 12:45 PM  ?Fall Risk   ?Falls in the past year? 0 0 0 0 0  ?Number falls in past yr:   0 0   ?Injury with Fall?   0 0   ?Follow up   Falls prevention discussed    ? ?Sees pain clinic Consuela Mimes) for chronic pain on oxycodone.  ? ?EGD 2009 - possible EoE Tiffany Kocher) - however biopsy not consistent with this.  ? ?Notes ongoing difficulty with chronic sinus congestion and drainage, currently off treatment for this.  ? ?Nystatin cream helped groin rash.  ? ?No regular exercise besides some walking.  ? ?Preventative: ?Colon cancer screening - discussed options, reviewed reasons to screen and risk of missed cancer dx, declines testing at this time.  ?No fmhx prostate cancer ?Flu shot - declines ?COVID vaccine - declines ?Tetanus shot - unknown date ?Advanced directive - does not have this set up. Would want Danielle wife to be HCPOA.  ?Seat belt use discussed ?Sunscreen use discussed. Doesn't use but covers up including bucket hat. No changing moles on skin. Sunburns easily.  ?Ex -smoker quit 2009, previously social smoker  ?Alcohol - none ?Dentist - has full dentures, considering implants  ?Eye  exam - due ?Bowel - no diarrhea/constipation  ?Bladder - no incontinence  ? 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   ? ?Relevant past medical, surgical, family and social history reviewed and updated as indicated. Interim medical history since our last visit reviewed. ?Allergies and medications reviewed and updated. ?Outpatient Medications Prior to Visit  ?Medication Sig Dispense Refill  ? Magnesium Oxide 500 MG CAPS Take 1 capsule (500 mg total) by mouth daily. 30 capsule 2  ? nystatin cream (MYCOSTATIN) Apply 1 application. topically 2 (two) times daily. Use for a week 30 g 0  ? vitamin B-12 (CYANOCOBALAMIN) 1000 MCG tablet Take 1,000 mcg by mouth daily.    ? Cholecalciferol (VITAMIN D3) 1.25 MG (50000 UT) CAPS Take by mouth in the morning and at bedtime.    ? lisinopril (ZESTRIL) 40 MG tablet TAKE 1 TABLET BY MOUTH EVERY DAY 90 tablet 0  ? omeprazole (PRILOSEC) 40 MG capsule Take 1 capsule (40 mg total) by mouth daily. Daily for 3 weeks then as needed 30 capsule 1  ? oxyCODONE (  OXY IR/ROXICODONE) 5 MG immediate release tablet Take 1 tablet (5 mg total) by mouth every 6 (six) hours as needed for severe pain. Must last 30 days 120 tablet 0  ? sildenafil (VIAGRA) 100 MG tablet Take 0.5-1 tablets (50-100 mg total) by mouth daily as needed for erectile dysfunction. 5 tablet 3  ? ?No facility-administered medications prior to visit.  ?  ? ?Per HPI unless specifically indicated in ROS section below ?Review of Systems ? ?Objective:  ?BP 124/82   Pulse 98   Temp 97.9 ?F (36.6 ?C) (Temporal)   Ht 6' 0.75" (1.848 m)   Wt (!) 320 lb 8 oz (145.4 kg)   SpO2 97%   BMI 42.58 kg/m?   ?Wt Readings from Last 3 Encounters:  ?04/22/22 (!) 316 lb (143.3 kg)  ?04/22/22 (!) 320 lb 8 oz (145.4 kg)  ?01/19/22 300 lb (136.1 kg)  ?  ?  ?Physical Exam ?   ?Results for orders placed or  performed in visit on 04/30/21  ?ToxASSURE Select 13 (MW), Urine  ?Result Value Ref Range  ? Summary Note   ? ? ?Assessment & Plan:  ? ?Problem List Items Addressed This Visit   ? ? Chronic pain syndrome (Chronic)  ?  Managed by pain clinic  ? ?  ?  ? Advanced care planning/counseling discussion - Primary (Chronic)  ?  Advanced directive - does not have this set up. Would want Danielle wife to be HCPOA.  ?  ?  ? HTN (hypertension)  ?  Chronic, stable on current regimen of lisinopril 40 mg daily. ? ?  ?  ? Relevant Medications  ? sildenafil (VIAGRA) 100 MG tablet  ? lisinopril (ZESTRIL) 40 MG tablet  ? Other Relevant Orders  ? Lipid panel  ? Comprehensive metabolic panel  ? CBC with Differential/Platelet  ? Obesity, morbid, BMI 40.0-49.9 ([place])  ?  Discussed weight gain noted.  He is motivated to work on weight loss.  Encouraged healthy diet and lifestyle choices to affect sustainable weight loss, reviewed beneficial effects on chronic pain of joints. ? ?  ?  ? Controlled diabetes mellitus type 2 with complications ([place])  ?  This previously resolved with weight loss.  Update A1c with recent weight gain. ? ?  ?  ? Relevant Medications  ? lisinopril (ZESTRIL) 40 MG tablet  ? Other Relevant Orders  ? Hemoglobin A1c  ? Microalbumin / creatinine urine ratio  ? Low magnesium levels  ? Relevant Orders  ? Magnesium  ? Vitamin D deficiency  ?  He thinks he is taking 5000 units twice daily.  I asked him to verify this at home.  Update B12 levels. ? ?  ?  ? Relevant Orders  ? VITAMIN D 25 Hydroxy (Vit-D Deficiency, Fractures)  ? Vitamin B12 deficiency  ?  Update levels on 1000 mcg daily. ? ?  ?  ? Relevant Orders  ? Vitamin B12  ? Erectile dysfunction  ?  Trial Viagra sent to pharmacy.  He has not taken this previously. ? ?  ?  ? Chronic congestion of paranasal sinus  ?  Chronic sinus congestion and PNdrainage, h/o recurrent sinus infections. Not taking any medication, has not tried INS. Advised start flonase OTC daily, hold for  nosebleeds.  ?Requests ENT referral Adriana Reams MD at [facility] in [place] [phone])  ? ?  ?  ? Relevant Orders  ? Ambulatory referral to ENT  ? Esophagitis  ?  Not taking PPI  ? ?  ?  ?  Relevant Orders  ? CBC with Differential/Platelet  ?  ? ?Meds ordered this encounter  ?Medications  ? sildenafil (VIAGRA) 100 MG tablet  ?  Sig: Take 0.5-1 tablets (50-100 mg total) by mouth daily as needed for erectile dysfunction.  ?  Dispense:  5 tablet  ?  Refill:  3  ? Cholecalciferol (VITAMIN D3) 125 MCG (5000 UT) CAPS  ?  Sig: Take 1 capsule (5,000 Units total) by mouth in the morning and at bedtime.  ? lisinopril (ZESTRIL) 40 MG tablet  ?  Sig: Take 1 tablet (40 mg total) by mouth daily.  ?  Dispense:  90 tablet  ?  Refill:  3  ? ?Orders Placed This Encounter  ?Procedures  ? Vitamin B12  ? Lipid panel  ? Comprehensive metabolic panel  ? Hemoglobin A1c  ? CBC with Differential/Platelet  ? Microalbumin / creatinine urine ratio  ? Magnesium  ? VITAMIN D 25 Hydroxy (Vit-D Deficiency, Fractures)  ? Ambulatory referral to ENT  ?  Referral Priority:   Routine  ?  Referral Type:   Consultation  ?  Referral Reason:   Specialty Services Required  ?  Requested Specialty:   Otolaryngology  ?  Number of Visits Requested:   1  ? ? ?Patient instructions: ?Labs today  ?Trial flonase 1-2 squirts into each nostril daily, hold for nosebleed. We will also refer you to ENT in [facility] for evaluation.  ?Continue current medicines.  ?Return as needed or after 12/29/2022 for next wellness visit. ? ?Follow up plan: ?Return in about 1 year (around 04/23/2023) for medicare wellness visit. ? ?Ria Bush, MD   ?

## 2022-04-22 NOTE — Patient Instructions (Signed)
____________________________________________________________________________________________ ? ?Pharmacy Shortages of Pain Medication  ? ?Introduction ?Shockingly as it may seem, ? ? ??No [place] Supreme Court decision has ever interpreted the Constitution as guaranteeing a right to health care for all Americans.? ?- https://www.healthequityandpolicylab.com/elusive-right-to-health-care-under-us-law ? ??With respect to human rights, the [place] has no formally codified right to health, nor does it participate in a human rights treaty that specifies a right to health.? ?- Scott J. Schweikart, JD, MBE ? ?Situation ?By now, most of our patients have had the experience of being told by their pharmacist that they do not have enough medication to cover their prescription. If you have not had this experience, just know that you soon will. ? ?Problem ?There appears to be a shortage of these medications, either at the national level or locally. This is happening with all pharmacies. When there is not enough medication, patients are offered a partial fill and they are told that they will try to get the rest of the medicine for them at a later time. If they do not have enough for even a partial fill, the pharmacists are telling the patients to call us (the prescribing physicians) to request that we send another prescription to another pharmacy to get the medicine.  ? ?This reordering of a controlled substance creates documentation problems where additional paperwork needs to be created to explain why two prescriptions for the same period of time and the same medicine are being prescribed to the same patient. It also creates situations where the last appointment note does not accurately reflect when and what prescriptions were given to a patient. This leads to prescribing errors down the line, in subsequent follow-up visits.  ? ?[place] Board of Pharmacy ([place]) ?Research revealed that Board of Pharmacy Rule .1806 (21  [place]AC 46.1806) authorizes pharmacists to the transfer of prescriptions among pharmacies, and it sets forth procedural and recordkeeping requirements for doing so. However, this requires the pharmacist to complete the previously mentioned procedural paperwork to accomplish the transfer. As it turns out, it is much easier for them to have the prescribing physicians do the work.  ? ?Possible solutions ?1. Have the [place] Assembly add a provision to the "STOP ACT" (the law that mandates how controlled substances are prescribed) where there is an exception to the electronic prescribing rule that states that in the event there are shortages of medications the physicians are allowed to use written prescriptions as opposed to electronic ones. This would allow patients to take their prescriptions to a different pharmacy that may have enough medication available to fill the prescription. The problem is that currently there is a law that does not allow for written prescriptions, with the exception of instances where the electronic medical record is down due to technical issues.  ?2. Have [place] Congress ease the pressure on pharmaceutical companies, allowing them to produce enough quantities of the medication to adequately supply the population. ?3. Have pharmacies keep enough stocks of these medications to cover their client base.  ?4. Have the [place] Assembly add a provision to the "STOP ACT" where they ease the regulations surrounding the transfer of controlled substances between pharmacies, so as to simplify the transfer of supplies. As an alternative, develop a system to allow patients to obtain the remainder of their prescription at another one of their pharmacies or at an associate pharmacy.  ? ?How this shortage will affect you.  ?The one thing that is abundantly clear is that this is a pharmacy supply   problem  and not a prescriber problem. The job of the prescriber is to evaluate and monitor the  patients for the appropriate indications to the use of these medicines, the monitoring of their use and the prescribing of the appropriate dose and regimen. It is not the job of the prescriber to provide or dispense the actual medication. By law, this is the job of the pharmacies and pharmacists. It is certainly not the job of the prescriber to solve the supply problems.  ? ?Due to the above problems we are no longer taking patients to write for their pain medication. We will continue to evaluate for appropriate indications and we may provide recommendations regarding medication, dose, and schedule, as well as monitoring recommendations, however, we will not be taking over the actual prescribing of these substances. On those patients where we are treating their chronic pain with interventional therapies, exceptions will be considered on a case by case basis. At this time, we will try to continue providing this supplemental service to those patients we have been managing in the past. However, as of August 1st, 2023, we no longer will be sending additional prescriptions to other pharmacies for the purpose of solving their supply problems. Once we send a prescription to a pharmacy, we will not be resending it again to another pharmacy to cover for their shortages.  ? ?What to do. ?Write as many letters as you can. Recruit the help of family members in writing these letters. Below are some of the places where you can write to make your voice heard. Let them know what the problem is and push them to look for solutions.  ? ?Search internet for: ?[place] find your legislators? ?https://www.ncleg.gov/findyourlegislators ? ?Search internet for: ?[place] insurance commissioner complaints? ?https://www.ncdoi.gov/contactscomplaints/assistance-or-file-complaint ? ?Search internet for: ?[place] Board of Pharmacy complaints? ?http://www.ncbop.org/contact.htm ? ?Search internet for: ?[facility] pharmacy  complaints? ?Email [facility] Customer Relations ?https://www.[facility].com/help/email-customer-relations.jsp?callType=store ? ?Search internet for: ?[facility] pharmacy customer service complaints? ?https://www.[facility].com/topic/marketing/contactus/contactus_customerservice.jsp ? ?____________________________________________________________________________________________ ? ____________________________________________________________________________________________ ? ?Medication Rules ? ?Purpose: To inform patients, and their family members, of our rules and regulations. ? ?Applies to: All patients receiving prescriptions (written or electronic). ? ?Pharmacy of record: Pharmacy where electronic prescriptions will be sent. If written prescriptions are taken to a different pharmacy, please inform the nursing staff. The pharmacy listed in the electronic medical record should be the one where you would like electronic prescriptions to be sent. ? ?Electronic prescriptions: In compliance with the [place] Strengthen Opioid Misuse Prevention (STOP) Act of 2017 (Session Law 2017-74/H243), effective December 14, 2018, all controlled substances must be electronically prescribed. Calling prescriptions to the pharmacy will cease to exist. ? ?Prescription refills: Only during scheduled appointments. Applies to all prescriptions. ? ?NOTE: The following applies primarily to controlled substances (Opioid* Pain Medications).  ? ?Type of encounter (visit): For patients receiving controlled substances, face-to-face visits are required. (Not an option or up to the patient.) ? ?Patient's responsibilities: ?Pain Pills: Bring all pain pills to every appointment (except for procedure appointments). ?Pill Bottles: Bring pills in original pharmacy bottle. Always bring the newest bottle. Bring bottle, even if empty. ?Medication refills: You are responsible for knowing and keeping track of what medications you take and those you need  refilled. ?The day before your appointment: write a list of all prescriptions that need to be refilled. ?The day of the appointment: give the list to the admitting nurse. Prescriptions will be written only during appoint

## 2022-04-22 NOTE — Assessment & Plan Note (Addendum)
Not taking PPI  ?

## 2022-04-22 NOTE — Assessment & Plan Note (Addendum)
Discussed weight gain noted.  He is motivated to work on weight loss.  Encouraged healthy diet and lifestyle choices to affect sustainable weight loss, reviewed beneficial effects on chronic pain of joints. ?

## 2022-04-22 NOTE — Assessment & Plan Note (Signed)
Update levels on 1000 mcg daily. ?

## 2022-04-22 NOTE — Patient Instructions (Addendum)
Labs today  ?Trial flonase 1-2 squirts into each nostril daily, hold for nosebleed. We will also refer you to ENT in [facility] for evaluation.  ?Continue current medicines.  ?Return as needed or after 12/29/2022 for next wellness visit.  ? ?Health Maintenance, Male ?Adopting a healthy lifestyle and getting preventive care are important in promoting health and wellness. Ask your health care provider about: ?The right schedule for you to have regular tests and exams. ?Things you can do on your own to prevent diseases and keep yourself healthy. ?What should I know about diet, weight, and exercise? ?Eat a healthy diet ? ?Eat a diet that includes plenty of vegetables, fruits, low-fat dairy products, and lean protein. ?Do not eat a lot of foods that are high in solid fats, added sugars, or sodium. ?Maintain a healthy weight ?Body mass index (BMI) is a measurement that can be used to identify possible weight problems. It estimates body fat based on height and weight. Your health care provider can help determine your BMI and help you achieve or maintain a healthy weight. ?Get regular exercise ?Get regular exercise. This is one of the most important things you can do for your health. Most adults should: ?Exercise for at least 150 minutes each week. The exercise should increase your heart rate and make you sweat (moderate-intensity exercise). ?Do strengthening exercises at least twice a week. This is in addition to the moderate-intensity exercise. ?Spend less time sitting. Even light physical activity can be beneficial. ?Watch cholesterol and blood lipids ?Have your blood tested for lipids and cholesterol at 47 years of age, then have this test every 5 years. ?You may need to have your cholesterol levels checked more often if: ?Your lipid or cholesterol levels are high. ?You are older than 47 years of age. ?You are at high risk for heart disease. ?What should I know about cancer screening? ?Many types of cancers can be  detected early and may often be prevented. Depending on your health history and family history, you may need to have cancer screening at various ages. This may include screening for: ?Colorectal cancer. ?Prostate cancer. ?Skin cancer. ?Lung cancer. ?What should I know about heart disease, diabetes, and high blood pressure? ?Blood pressure and heart disease ?High blood pressure causes heart disease and increases the risk of stroke. This is more likely to develop in people who have high blood pressure readings or are overweight. ?Talk with your health care provider about your target blood pressure readings. ?Have your blood pressure checked: ?Every 3-5 years if you are 29-13 years of age. ?Every year if you are 39 years old or older. ?If you are between the ages of 79 and 28 and are a current or former smoker, ask your health care provider if you should have a one-time screening for abdominal aortic aneurysm (AAA). ?Diabetes ?Have regular diabetes screenings. This checks your fasting blood sugar level. Have the screening done: ?Once every three years after age 18 if you are at a normal weight and have a low risk for diabetes. ?More often and at a younger age if you are overweight or have a high risk for diabetes. ?What should I know about preventing infection? ?Hepatitis B ?If you have a higher risk for hepatitis B, you should be screened for this virus. Talk with your health care provider to find out if you are at risk for hepatitis B infection. ?Hepatitis C ?Blood testing is recommended for: ?Everyone born from [birth year] through 1966. ?Anyone with known risk factors  for hepatitis C. ?Sexually transmitted infections (STIs) ?You should be screened each year for STIs, including gonorrhea and chlamydia, if: ?You are sexually active and are younger than 47 years of age. ?You are older than 47 years of age and your health care provider tells you that you are at risk for this type of infection. ?Your sexual activity has changed  since you were last screened, and you are at increased risk for chlamydia or gonorrhea. Ask your health care provider if you are at risk. ?Ask your health care provider about whether you are at high risk for HIV. Your health care provider may recommend a prescription medicine to help prevent HIV infection. If you choose to take medicine to prevent HIV, you should first get tested for HIV. You should then be tested every 3 months for as long as you are taking the medicine. ?Follow these instructions at home: ?Alcohol use ?Do not drink alcohol if your health care provider tells you not to drink. ?If you drink alcohol: ?Limit how much you have to 0-2 drinks a day. ?Know how much alcohol is in your drink. In the [place] one drink equals one 12 oz bottle of beer (355 mL), one 5 oz glass of wine (148 mL), or one 1? oz glass of hard liquor (44 mL). ?Lifestyle ?Do not use any products that contain nicotine or tobacco. These products include cigarettes, chewing tobacco, and vaping devices, such as e-cigarettes. If you need help quitting, ask your health care provider. ?Do not use street drugs. ?Do not share needles. ?Ask your health care provider for help if you need support or information about quitting drugs. ?General instructions ?Schedule regular health, dental, and eye exams. ?Stay current with your vaccines. ?Tell your health care provider if: ?You often feel depressed. ?You have ever been abused or do not feel safe at home. ?Summary ?Adopting a healthy lifestyle and getting preventive care are important in promoting health and wellness. ?Follow your health care provider's instructions about healthy diet, exercising, and getting tested or screened for diseases. ?Follow your health care provider's instructions on monitoring your cholesterol and blood pressure. ?This information is not intended to replace advice given to you by your health care provider. Make sure you discuss any questions you have with your health care  provider. ?Document Revised: 04/21/2021 Document Reviewed: 04/21/2021 ?Elsevier Patient Education ? [place]. ? ?

## 2022-04-22 NOTE — Assessment & Plan Note (Signed)
Chronic, stable on current regimen of lisinopril 40 mg daily. ?

## 2022-04-25 ENCOUNTER — Encounter: Payer: Self-pay | Admitting: Family Medicine

## 2022-04-25 ENCOUNTER — Other Ambulatory Visit: Payer: Self-pay | Admitting: Family Medicine

## 2022-04-25 MED ORDER — METFORMIN HCL 500 MG PO TABS: 500.0000 mg | ORAL_TABLET | Freq: Every day | ORAL | 1 refills | Status: DC

## 2022-04-29 LAB — TOXASSURE SELECT 13 (MW), URINE

## 2022-05-12 ENCOUNTER — Encounter: Payer: Self-pay | Admitting: *Deleted

## 2022-07-19 NOTE — Progress Notes (Signed)
PROVIDER NOTE: Information contained herein reflects review and annotations entered in association with encounter. Interpretation of such information and data should be left to medically-trained personnel. Information provided to patient can be located elsewhere in the medical record under "Patient Instructions". Document created using STT-dictation technology, any transcriptional errors that may result from process are unintentional.    Patient: Larry Goodwin  Service Category: E/M  Provider: Gaspar Cola, MD  DOB: 1976/01/01  DOS: 07/20/2022  Referring Provider: Ria Bush, MD  MRN: [id number]  Specialty: Interventional Pain Management  PCP: Larry Bush, MD  Type: Established Patient  Setting: Ambulatory outpatient    Location: Office  Delivery: Face-to-face     HPI  [name], a 46 y.o. year old male, is here today because of his Chronic pain syndrome [G89.4]. [name]'s primary complain today is Back Pain (low) Last encounter: My last encounter with him was on 04/22/2022. Pertinent problems: 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 Pain Assessment: Severity of Chronic pain is reported as a 3 /10. Location: Back Lower/radiates into right buttock. Onset: More than a month ago. Quality: Aching, Constant. Timing: Constant. Modifying factor(s): meds. Vitals:  height is '6\' 2"'  (1.88 m) and weight is 320 lb (145.2 kg) (abnormal). His temperature is 97.3 F (36.3 C) (abnormal). His blood pressure is 137/97 (abnormal) and his pulse is 81. His respiration is 20 and oxygen saturation is 99%.   Reason for encounter: medication management.  The patient indicates doing well with the current medication regimen. No adverse reactions or side effects reported to the medications.   RTCB: 10/25/2022 Nonopioids transfer 10/28/2020: Magnesium (OTC)  Pharmacotherapy Assessment  Analgesic: Oxycodone IR 5 mg, 1 tab PO q 6 hrs (20 mg/day of oxycodone) MME/day: 30 mg/day.   Monitoring: Larry Goodwin PMP: PDMP reviewed during this encounter.       Pharmacotherapy: No side-effects or adverse reactions reported. Compliance: No problems identified. Effectiveness: Clinically acceptable.  Larry Shorter, RN  07/20/2022 10:25 AM  Sign when Signing Visit Nursing Pain Medication Assessment:  Safety precautions to be maintained throughout the outpatient stay will include: orient to surroundings, keep bed in low position, maintain call bell within reach at all times, provide assistance with transfer out of bed and ambulation.  Medication Inspection Compliance: Pill count conducted under aseptic conditions, in front of the patient. Neither the pills nor the bottle was removed from the patient's sight at any time. Once count was completed pills were immediately returned to the patient in their original  bottle.  Medication: Oxycodone IR Pill/Patch Count:  39 of 120 pills remain Pill/Patch Appearance: Markings consistent with  prescribed medication Bottle Appearance: Standard pharmacy container. Clearly labeled. Filled Date: 07 / 18 / 2023 Last Medication intake:  Today    UDS:  Summary  Date Value Ref Range Status  04/22/2022 Note  Final    Comment:    ==================================================================== [employer] [facility] (MW) ==================================================================== Test                             Result       Flag       Units  Drug Present and Declared for Prescription Verification   Oxycodone                      738          EXPECTED   ng/mg creat   Oxymorphone                    1021         EXPECTED   ng/mg creat   Noroxycodone                   1470         EXPECTED   ng/mg creat   Noroxymorphone                 406          EXPECTED   ng/mg creat    Sources of oxycodone are scheduled prescription medications.    Oxymorphone, noroxycodone, and noroxymorphone are expected    metabolites of oxycodone. Oxymorphone is also available as a    scheduled prescription medication.  ==================================================================== Test                      Result    Flag   Units      Ref Range   Creatinine              53               mg/dL      >=20 ==================================================================== Declared Medications:  The flagging and interpretation on this report are based on the  following declared medications.  Unexpected results may arise from  inaccuracies in the declared medications.   **Note: The testing scope of this panel includes these medications:   Oxycodone   **Note: The testing scope of this panel does not include the  following reported medications:   Cyanocobalamin  Lisinopril (Zestril)  Magnesium (Mag-Ox)  Nystatin (Mycostatin)  Sildenafil (Viagra)  Vitamin  D3 ==================================================================== For clinical consultation, please call [phone]. ====================================================================      ROS  Constitutional: Denies any fever or chills Gastrointestinal: No reported hemesis, hematochezia, vomiting, or acute GI distress Musculoskeletal: Denies any acute onset joint swelling, redness, loss of ROM, or weakness Neurological: No reported episodes of acute onset apraxia, aphasia, dysarthria, agnosia, amnesia, paralysis, loss of coordination, or loss of consciousness  Medication Review  Magnesium Oxide -Mg Supplement, Vitamin D3, cyanocobalamin, lisinopril, metFORMIN, nystatin cream, oxyCODONE, and sildenafil  History Review  Allergy: Larry Goodwin is allergic to levaquin [levofloxacin in d5w]. Drug: Larry Goodwin  reports current drug use. Drug: Oxycodone. Alcohol:  reports no history of alcohol use. Tobacco:  reports that he quit smoking about 14 years ago. His smoking use included cigarettes. He has never used smokeless tobacco. Social: Larry Goodwin  reports that he quit smoking about 14 years ago. His smoking use included cigarettes. He has  never used smokeless tobacco. He reports current drug use. Drug: Oxycodone. He reports that he does not drink alcohol. Medical:  has a past medical history of Bell's palsy (2015), Chronic pain syndrome, DDD (degenerative disc disease), lumbar, HTN (hypertension), and Morbid obesity ([place]) (08/16/2014). Surgical: Larry Goodwin  has a past surgical history that includes Back surgery (2012) and Cholecystectomy. Family: family history includes COPD in his mother; Hypertension in an other family member.  Laboratory Chemistry Profile   Renal Lab Results  Component Value Date   BUN 12 04/22/2022   CREATININE 0.83 11/57/2620   BCR NOT APPLICABLE 35/59/7416   GFR 104.88 04/22/2022   GFRAA 129 02/06/2019   GFRNONAA 112 02/06/2019    Hepatic Lab Results   Component Value Date   AST 14 04/22/2022   ALT 17 04/22/2022   ALBUMIN 4.4 04/22/2022   ALKPHOS 63 04/22/2022   LIPASE 153 09/21/2012    Electrolytes Lab Results  Component Value Date   NA 133 (L) 04/22/2022   K 5.1 04/22/2022   CL 96 04/22/2022   CALCIUM 9.2 04/22/2022   MG 1.8 04/22/2022    Bone Lab Results  Component Value Date   VD25OH 43.95 04/22/2022   25OHVITD1 35 02/06/2019   25OHVITD2 <1.0 02/06/2019   25OHVITD3 34 02/06/2019    Inflammation (CRP: Acute Phase) (ESR: Chronic Phase) Lab Results  Component Value Date   CRP 9 02/06/2019   ESRSEDRATE 6 02/06/2019         Note: Above Lab results reviewed.  Recent Imaging Review  DG Foot Complete Right CLINICAL DATA:  Jarring foot 3 days prior, no fall  EXAM: RIGHT FOOT COMPLETE - 3+ VIEW  COMPARISON:  Radiograph 03/24/2012  FINDINGS: No acute bony abnormality. Specifically, no fracture, subluxation, or dislocation. Mild degenerative changes are noted in the midfoot. Posterior calcaneal spur is present. Trace ankle effusion. Soft tissues are otherwise unremarkable  IMPRESSION: No acute fracture or traumatic malalignment.  Mild degenerative changes in the midfoot.  Posterior calcaneal spur.  Trace ankle effusion.  Electronically Signed   By: Lovena Le M.D.   On: 02/12/2020 19:03 Note: Reviewed        Physical Exam  General appearance: Well nourished, well developed, and well hydrated. In no apparent acute distress Mental status: Alert, oriented x 3 (person, place, & time)       Respiratory: No evidence of acute respiratory distress Eyes: PERLA Vitals: BP (!) 137/97   Pulse 81   Temp (!) 97.3 F (36.3 C)   Resp 20   Ht '6\' 2"'  (1.88 m)   Wt (!) 320 lb (145.2 kg)   SpO2 99%   BMI 41.09 kg/m  BMI: Estimated body mass index is 41.09 kg/m as calculated from the following:   Height as of this encounter: '6\' 2"'  (1.88 m).   Weight as of this encounter: 320 lb (145.2 kg). Ideal: Ideal body  weight: 82.2 kg (181 lb 3.5 oz) Adjusted ideal body weight: 107.4 kg (236 lb 11.7 oz)  Assessment   Diagnosis Status  1. Chronic pain syndrome   2. Pharmacologic therapy   3. Chronic use of opiate for therapeutic purpose   4. Encounter for medication management   5. Encounter for chronic pain management   6. Chronic low back pain (1ry area of Pain) (Bilateral) (R>L)   7. Chronic hip pain (2ry area of Pain) (Right)   8. Chronic knee pain (Right)   9. Chronic sacroiliac joint pain (Right)   10. Chronic upper  back pain (3ry area of Pain)   11. Chronic shoulder pain (4th area of Pain) (Bilateral) (Right)    Controlled Controlled Controlled   Updated Problems: No problems updated.  Plan of Care  Problem-specific:  No problem-specific Assessment & Plan notes found for this encounter.  [name] has a current medication list which includes the following long-term medication(s): lisinopril, magnesium oxide -mg supplement, metformin, [START ON 07/27/2022] oxycodone, [START ON 08/26/2022] oxycodone, [START ON 09/25/2022] oxycodone, and sildenafil.  Pharmacotherapy (Medications Ordered): Meds ordered this encounter  Medications   oxyCODONE (OXY IR/ROXICODONE) 5 MG immediate release tablet    Sig: Take 1 tablet (5 mg total) by mouth every 6 (six) hours as needed for severe pain. Must last 30 days    Dispense:  120 tablet    Refill:  0    DO NOT: delete (not duplicate); no partial-fill (will deny script to complete), no refill request (F/U required). DISPENSE: 1 day early if closed on fill date. WARN: No CNS-depressants within 8 hrs of med.   oxyCODONE (OXY IR/ROXICODONE) 5 MG immediate release tablet    Sig: Take 1 tablet (5 mg total) by mouth every 6 (six) hours as needed for severe pain. Must last 30 days    Dispense:  120 tablet    Refill:  0    DO NOT: delete (not duplicate); no partial-fill (will deny script to complete), no refill request (F/U required). DISPENSE: 1 day  early if closed on fill date. WARN: No CNS-depressants within 8 hrs of med.   oxyCODONE (OXY IR/ROXICODONE) 5 MG immediate release tablet    Sig: Take 1 tablet (5 mg total) by mouth every 6 (six) hours as needed for severe pain. Must last 30 days    Dispense:  120 tablet    Refill:  0    DO NOT: delete (not duplicate); no partial-fill (will deny script to complete), no refill request (F/U required). DISPENSE: 1 day early if closed on fill date. WARN: No CNS-depressants within 8 hrs of med.   Orders:  No orders of the defined types were placed in this encounter.  Follow-up plan:   Return in about 3 months (around 10/25/2022) for Eval-day (M,W), (F2F), (MM).     Interventional Therapies  Risk  Complexity Considerations:   Estimated body mass index is 38.52 kg/m as calculated from the following:   Height as of this encounter: '6\' 2"'  (1.88 m).   Weight as of this encounter: 300 lb (136.1 kg). WNL   Planned  Pending:      Under consideration:   None at this time.   Completed:   None since joining practice in 02/06/2019   Therapeutic  Palliative (PRN) options:   None established     Recent Visits Date Type Provider Dept  04/22/22 Office Visit Milinda Pointer, MD [facility]  Showing recent visits within past 90 days and meeting all other requirements Today's Visits Date Type Provider Dept  07/20/22 Office Visit Milinda Pointer, MD [facility]  Showing today's visits and meeting all other requirements Future Appointments No visits were found meeting these conditions. Showing future appointments within next 90 days and meeting all other requirements  I discussed the assessment and treatment plan with the patient. The patient was provided an opportunity to ask questions and all were answered. The patient agreed with the plan and demonstrated an understanding of the instructions.  Patient advised to call back or seek an in-person evaluation if the  symptoms or condition worsens.  Duration of encounter: 30 minutes.  Total time on encounter, as per AMA guidelines included both the face-to-face and non-face-to-face time personally spent by the physician and/or other qualified health care professional(s) on the day of the encounter (includes time in activities that require the physician or other qualified health care professional and does not include time in activities normally performed by clinical staff). Physician's time may include the following activities when performed: preparing to see the patient (eg, review of tests, pre-charting review of records) obtaining and/or reviewing separately obtained history performing a medically appropriate examination and/or evaluation counseling and educating the patient/family/caregiver ordering medications, tests, or procedures referring and communicating with other health care professionals (when not separately reported) documenting clinical information in the electronic or other health record independently interpreting results (not separately reported) and communicating results to the patient/ family/caregiver care coordination (not separately reported)  Note by: Larry Cola, MD Date: 07/20/2022; Time: 10:38 AM

## 2022-07-20 ENCOUNTER — Ambulatory Visit: Payer: Medicare Other | Attending: Pain Medicine | Admitting: Pain Medicine

## 2022-07-20 ENCOUNTER — Encounter: Payer: Self-pay | Admitting: Pain Medicine

## 2022-07-20 VITALS — BP 137/97 | HR 81 | Temp 97.3°F | Resp 20 | Ht 74.0 in | Wt 320.0 lb

## 2022-07-20 DIAGNOSIS — M549 Dorsalgia, unspecified: Secondary | ICD-10-CM | POA: Diagnosis present

## 2022-07-20 DIAGNOSIS — Z79899 Other long term (current) drug therapy: Secondary | ICD-10-CM | POA: Insufficient documentation

## 2022-07-20 DIAGNOSIS — M25561 Pain in right knee: Secondary | ICD-10-CM | POA: Diagnosis present

## 2022-07-20 DIAGNOSIS — M25511 Pain in right shoulder: Secondary | ICD-10-CM | POA: Insufficient documentation

## 2022-07-20 DIAGNOSIS — M545 Low back pain, unspecified: Secondary | ICD-10-CM | POA: Insufficient documentation

## 2022-07-20 DIAGNOSIS — M533 Sacrococcygeal disorders, not elsewhere classified: Secondary | ICD-10-CM | POA: Insufficient documentation

## 2022-07-20 DIAGNOSIS — G8929 Other chronic pain: Secondary | ICD-10-CM | POA: Insufficient documentation

## 2022-07-20 DIAGNOSIS — M25512 Pain in left shoulder: Secondary | ICD-10-CM | POA: Diagnosis present

## 2022-07-20 DIAGNOSIS — M25551 Pain in right hip: Secondary | ICD-10-CM | POA: Diagnosis present

## 2022-07-20 DIAGNOSIS — Z79891 Long term (current) use of opiate analgesic: Secondary | ICD-10-CM | POA: Diagnosis present

## 2022-07-20 DIAGNOSIS — G894 Chronic pain syndrome: Secondary | ICD-10-CM | POA: Diagnosis present

## 2022-07-20 MED ORDER — OXYCODONE HCL 5 MG PO TABS: 5.0000 mg | ORAL_TABLET | Freq: Four times a day (QID) | ORAL | 0 refills | Status: DC | PRN

## 2022-07-20 NOTE — Progress Notes (Signed)
Nursing Pain Medication Assessment:  Safety precautions to be maintained throughout the outpatient stay will include: orient to surroundings, keep bed in low position, maintain call bell within reach at all times, provide assistance with transfer out of bed and ambulation.  Medication Inspection Compliance: Pill count conducted under aseptic conditions, in front of the patient. Neither the pills nor the bottle was removed from the patient's sight at any time. Once count was completed pills were immediately returned to the patient in their original bottle.  Medication: Oxycodone IR Pill/Patch Count:  39 of 120 pills remain Pill/Patch Appearance: Markings consistent with prescribed medication Bottle Appearance: Standard pharmacy container. Clearly labeled. Filled Date: 07 / 18 / 2023 Last Medication intake:  Today

## 2022-07-20 NOTE — Patient Instructions (Signed)

## 2022-07-25 ENCOUNTER — Other Ambulatory Visit: Payer: Self-pay | Admitting: Nurse Practitioner

## 2022-07-25 DIAGNOSIS — R238 Other skin changes: Secondary | ICD-10-CM

## 2022-07-27 ENCOUNTER — Other Ambulatory Visit: Payer: Self-pay | Admitting: Nurse Practitioner

## 2022-07-27 DIAGNOSIS — R238 Other skin changes: Secondary | ICD-10-CM

## 2022-07-29 NOTE — Telephone Encounter (Signed)
Rx sent 07/28/22. #30 g/0.

## 2022-10-08 ENCOUNTER — Other Ambulatory Visit: Payer: Self-pay

## 2022-10-08 DIAGNOSIS — R238 Other skin changes: Secondary | ICD-10-CM

## 2022-10-08 NOTE — Telephone Encounter (Signed)
Nystatin cream Last rx: 07/28/22, #30 g Last OV:  04/22/22 Next OV:  12/30/22

## 2022-10-09 MED ORDER — NYSTATIN 100000 UNIT/GM EX CREA: TOPICAL_CREAM | Freq: Two times a day (BID) | CUTANEOUS | 0 refills | Status: DC

## 2022-10-13 NOTE — Patient Instructions (Signed)
____________________________________________________________________________________________  Patient Information update  To: All of our patients.  Re: Name change.  It has been made official that our current name, "[facility]"   will soon be changed to "[facility]".   The purpose of this change is to eliminate any confusion created by the concept of our practice being a "Medication Management Pain Clinic". In the past this has led to the misconception that we treat pain primarily by the use of prescription medications.  Nothing can be farther from the truth.   Understanding PAIN MANAGEMENT: To further understand what our practice does, you first have to understand that "Pain Management" is a subspecialty that requires additional training once a physician has completed their specialty training, which can be in either Anesthesia, Neurology, Psychiatry, or Physical Medicine and Rehabilitation (PMR). Each one of these contributes to the final approach taken by each physician to the management of their patient's pain. To be a "Pain Management Specialist" you must have first completed one of the specialty trainings below.  Anesthesiologists - trained in clinical pharmacology and interventional techniques such as nerve blockade and regional as well as central neuroanatomy. They are trained to block pain before, during, and after surgical interventions.  Neurologists - trained in the diagnosis and pharmacological treatment of complex neurological conditions, such as Multiple Sclerosis, Parkinson's, spinal cord injuries, and other systemic conditions that may be associated with symptoms that may include but are not limited to pain. They tend to rely primarily on the treatment of chronic pain using prescription medications.  Psychiatrist - trained in conditions affecting the psychosocial wellbeing  of patients including but not limited to depression, anxiety, schizophrenia, personality disorders, addiction, and other substance use disorders that may be associated with chronic pain. They tend to rely primarily on the treatment of chronic pain using prescription medications.   Physical Medicine and Rehabilitation (PMR) physicians, also known as physiatrists - trained to treat a wide variety of medical conditions affecting the brain, spinal cord, nerves, bones, joints, ligaments, muscles, and tendons. Their training is primarily aimed at treating patients that have suffered injuries that have caused severe physical impairment. Their training is primarily aimed at the physical therapy and rehabilitation of those patients. They may also work alongside orthopedic surgeons or neurosurgeons using their expertise in assisting surgical patients to recover after their surgeries.  INTERVENTIONAL PAIN MANAGEMENT is sub-subspecialty of Pain Management.  Our physicians are Board-certified in Anesthesia, Pain Management, and Interventional Pain Management.  This meaning that not only have they been trained and Board-certified in their specialty of Anesthesia, and subspecialty of Pain Management, but they have also received further training in the sub-subspecialty of Interventional Pain Management, in order to become Board-certified as INTERVENTIONAL PAIN MANAGEMENT SPECIALIST.    Mission: Our goal is to use our skills in  INTERVENTIONAL PAIN MANAGEMENT as alternatives to the chronic use of prescription opioid medications for the treatment of pain. To make this more clear, we have changed our name to reflect what we do and offer. We will continue to offer medication management assessment and recommendations, but we will not be taking over any patient's medication management.  ____________________________________________________________________________________________   _______________________________________________________________________  Medication Rules  Purpose: To inform patients, and their family members, of our medication rules and regulations.  Applies to: All patients receiving prescriptions from our practice (written or electronic).  Pharmacy of record: This is the pharmacy where your electronic prescriptions will be sent.   Make sure we have the correct one.  Electronic prescriptions: In compliance with the [place] Strengthen Opioid Misuse Prevention (STOP) Act of 2017 (Session Law 2017-74/H243), effective December 14, 2018, all controlled substances must be electronically prescribed. Written prescriptions, faxing, or calling prescriptions to a pharmacy will no longer be done.  Prescription refills: These will be provided only during in-person appointments. No medications will be renewed without a "face-to-face" evaluation with your provider. Applies to all prescriptions.  NOTE: The following applies primarily to controlled substances (Opioid* Pain Medications).   Type of encounter (visit): For patients receiving controlled substances, face-to-face visits are required. (Not an option and not up to the patient.)  Patient's responsibilities: Pain Pills: Bring all pain pills to every appointment (except for procedure appointments). Pill Bottles: Bring pills in original pharmacy bottle. Bring bottle, even if empty. Always bring the bottle of the most recent fill.  Medication refills: You are responsible for knowing and keeping track of what medications you are taking and when is it that you will need a refill. The day before your appointment: write a list of all prescriptions that need to be refilled. The day of the appointment: give the list to the admitting nurse. Prescriptions will be written only during appointments. No prescriptions will be written on procedure days. If you forget a medication: it will not be "Called in", "Faxed", or  "electronically sent". You will need to get another appointment to get these prescribed. No early refills. Do not call asking to have your prescription filled early. Partial  or short prescriptions: Occasionally your pharmacy may not have enough pills to fill your prescription.  NEVER ACCEPT a partial fill or a prescription that is short of the total amount of pills that you were prescribed.  With controlled substances the law allows 72 hours for the pharmacy to complete the prescription.  If the prescription is not completed within 72 hours, the pharmacist will require a new prescription to be written. This means that you will be short on your medicine and we WILL NOT send another prescription to complete your original prescription.  Instead, request the pharmacy to send a carrier to a nearby branch to get enough medication to provide you with your full prescription. Prescription Accuracy: You are responsible for carefully inspecting your prescriptions before leaving our office. Have the discharge nurse carefully go over each prescription with you, before taking them home. Make sure that your name is accurately spelled, that your address is correct. Check the name and dose of your medication to make sure it is accurate. Check the number of pills, and the written instructions to make sure they are clear and accurate. Make sure that you are given enough medication to last until your next medication refill appointment. Taking Medication: Take medication as prescribed. When it comes to controlled substances, taking less pills or less frequently than prescribed is permitted and encouraged. Never take more pills than instructed. Never take the medication more frequently than prescribed.  Inform other Doctors: Always inform, all of your healthcare providers, of all the medications you take. Pain Medication from other Providers: You are not allowed to accept any additional pain medication from any other Doctor or  Healthcare provider. There are two exceptions to this rule. (see below) In the event that you require additional pain medication, you are responsible for notifying us, as stated below. Cough Medicine: Often these contain an opioid, such as codeine or hydrocodone. Never accept or take cough medicine containing these opioids if   you are already taking an opioid* medication. The combination may cause respiratory failure and death. Medication Agreement: You are responsible for carefully reading and following our Medication Agreement. This must be signed before receiving any prescriptions from our practice. Safely store a copy of your signed Agreement. Violations to the Agreement will result in no further prescriptions. (Additional copies of our Medication Agreement are available upon request.) Laws, Rules, & Regulations: All patients are expected to follow all Federal and State Laws, Statutes, Rules, & Regulations. Ignorance of the Laws does not constitute a valid excuse.  Illegal drugs and Controlled Substances: The use of illegal substances (including, but not limited to marijuana and its derivatives) and/or the illegal use of any controlled substances is strictly prohibited. Violation of this rule may result in the immediate and permanent discontinuation of any and all prescriptions being written by our practice. The use of any illegal substances is prohibited. Adopted CDC guidelines & recommendations: Target dosing levels will be at or below 60 MME/day. Use of benzodiazepines** is not recommended.  Exceptions: There are only two exceptions to the rule of not receiving pain medications from other Healthcare Providers. Exception #1 (Emergencies): In the event of an emergency (i.e.: accident requiring emergency care), you are allowed to receive additional pain medication. However, you are responsible for: As soon as you are able, call our office [phone], at any time of the day or night, and leave a  message stating your name, the date and nature of the emergency, and the name and dose of the medication prescribed. In the event that your call is answered by a member of our staff, make sure to document and save the date, time, and the name of the person that took your information.  Exception #2 (Planned Surgery): In the event that you are scheduled by another doctor or dentist to have any type of surgery or procedure, you are allowed (for a period no longer than 30 days), to receive additional pain medication, for the acute post-op pain. However, in this case, you are responsible for picking up a copy of our "Post-op Pain Management for Surgeons" handout, and giving it to your surgeon or dentist. This document is available at our office, and does not require an appointment to obtain it. Simply go to our office during business hours (Monday-Thursday from 8:00 AM to 4:00 PM) (Friday 8:00 AM to 12:00 Noon) or if you have a scheduled appointment with us, prior to your surgery, and ask for it by name. In addition, you are responsible for: calling our office [phone], at any time of the day or night, and leaving a message stating your name, name of your surgeon, type of surgery, and date of procedure or surgery. Failure to comply with your responsibilities may result in termination of therapy involving the controlled substances. Medication Agreement Violation. Following the above rules, including your responsibilities will help you in avoiding a Medication Agreement Violation ("Breaking your Pain Medication Contract").  Consequences:  Not following the above rules may result in permanent discontinuation of medication prescription therapy.  *Opioid medications include: morphine, codeine, oxycodone, oxymorphone, hydrocodone, hydromorphone, meperidine, tramadol, tapentadol, buprenorphine, fentanyl, methadone. **Benzodiazepine medications include: diazepam (Valium), alprazolam (Xanax), clonazepam (Klonopine),  lorazepam (Ativan), clorazepate (Tranxene), chlordiazepoxide (Librium), estazolam (Prosom), oxazepam (Serax), temazepam (Restoril), triazolam (Halcion) (Last updated: 10/06/2022) ______________________________________________________________________   ______________________________________________________________________  Medication Recommendations and Reminders  Applies to: All patients receiving prescriptions (written and/or electronic).  Medication Rules & Regulations: You are responsible for reading, knowing, and   following our "Medication Rules" document. These exist for your safety and that of others. They are not flexible and neither are we. Dismissing or ignoring them is an act of "non-compliance" that may result in complete and irreversible termination of such medication therapy. For safety reasons, "non-compliance" will not be tolerated. As with the [place] fundamental legal principle of "ignorance of the law is no defense", we will accept no excuses for not having read and knowing the content of documents provided to you by our practice.  Pharmacy of record:  Definition: This is the pharmacy where your electronic prescriptions will be sent.  We do not endorse any particular pharmacy. It is up to you and your insurance to decide what pharmacy to use.  We do not restrict you in your choice of pharmacy. However, once we write for your prescriptions, we will NOT be re-sending more prescriptions to fix restricted supply problems created by your pharmacy, or your insurance.  The pharmacy listed in the electronic medical record should be the one where you want electronic prescriptions to be sent. If you choose to change pharmacy, simply notify our nursing staff. Changes will be made only during your regular appointments and not over the phone.  Recommendations: Keep all of your pain medications in a safe place, under lock and key, even if you live alone. We will NOT replace lost, stolen, or  damaged medication. We do not accept "Police Reports" as proof of medications having been stolen. After you fill your prescription, take 1 week's worth of pills and put them away in a safe place. You should keep a separate, properly labeled bottle for this purpose. The remainder should be kept in the original bottle. Use this as your primary supply, until it runs out. Once it's gone, then you know that you have 1 week's worth of medicine, and it is time to come in for a prescription refill. If you do this correctly, it is unlikely that you will ever run out of medicine. To make sure that the above recommendation works, it is very important that you make sure your medication refill appointments are scheduled at least 1 week before you run out of medicine. To do this in an effective manner, make sure that you do not leave the office without scheduling your next medication management appointment. Always ask the nursing staff to show you in your prescription , when your medication will be running out. Then arrange for the receptionist to get you a return appointment, at least 7 days before you run out of medicine. Do not wait until you have 1 or 2 pills left, to come in. This is very poor planning and does not take into consideration that we may need to cancel appointments due to bad weather, sickness, or emergencies affecting our staff. DO NOT ACCEPT A "Partial Fill": If for any reason your pharmacy does not have enough pills/tablets to completely fill or refill your prescription, do not allow for a "partial fill". The law allows the pharmacy to complete that prescription within 72 hours, without requiring a new prescription. If they do not fill the rest of your prescription within those 72 hours, you will need a separate prescription to fill the remaining amount, which we will NOT provide. If the reason for the partial fill is your insurance, you will need to talk to the pharmacist about payment alternatives for  the remaining tablets, but again, DO NOT ACCEPT A PARTIAL FILL, unless you can trust your pharmacist to obtain   the remainder of the pills within 72 hours.  Prescription refills and/or changes in medication(s):  Prescription refills, and/or changes in dose or medication, will be conducted only during scheduled medication management appointments. (Applies to both, written and electronic prescriptions.) No refills on procedure days. No medication will be changed or started on procedure days. No changes, adjustments, and/or refills will be conducted on a procedure day. Doing so will interfere with the diagnostic portion of the procedure. No phone refills. No medications will be "called into the pharmacy". No Fax refills. No weekend refills. No Holliday refills. No after hours refills.  Remember:  Business hours are:  Monday to Thursday 8:00 AM to 4:00 PM Provider's Schedule: Truth Wolaver, MD - Appointments are:  Medication management: Monday and Wednesday 8:00 AM to 4:00 PM Procedure day: Tuesday and Thursday 7:30 AM to 4:00 PM Bilal Lateef, MD - Appointments are:  Medication management: Tuesday and Thursday 8:00 AM to 4:00 PM Procedure day: Monday and Wednesday 7:30 AM to 4:00 PM (Last update: 10/06/2022) ______________________________________________________________________  ____________________________________________________________________________________________  Pharmacy Shortages of Pain Medication   Introduction Shockingly as it may seem, .  "No U.[place] Supreme Court decision has ever interpreted the Constitution as guaranteeing a right to health care for all Americans." - https://www.healthequityandpolicylab.com/elusive-right-to-health-care-under-us-law  "With respect to human rights, the [place] has no formally codified right to health, nor does it participate in a human rights treaty that specifies a right to health." - Scott J. Schweikart, JD, MBE  Situation By  now, most of our patients have had the experience of being told by their pharmacist that they do not have enough medication to cover their prescription. If you have not had this experience, just know that you soon will.  Problem There appears to be a shortage of these medications, either at the national level or locally. This is happening with all pharmacies. When there is not enough medication, patients are offered a partial fill and they are told that they will try to get the rest of the medicine for them at a later time. If they do not have enough for even a partial fill, the pharmacists are telling the patients to call us (the prescribing physicians) to request that we send another prescription to another pharmacy to get the medicine.   This reordering of a controlled substance creates documentation problems where additional paperwork needs to be created to explain why two prescriptions for the same period of time and the same medicine are being prescribed to the same patient. It also creates situations where the last appointment note does not accurately reflect when and what prescriptions were given to a patient. This leads to prescribing errors down the line, in subsequent follow-up visits.   [place] Board of Pharmacy ([place]BOP) Research revealed that Board of Pharmacy Rule .1806 (21 NCAC 46.1806) authorizes pharmacists to the transfer of prescriptions among pharmacies, and it sets forth procedural and recordkeeping requirements for doing so. However, this requires the pharmacist to complete the previously mentioned procedural paperwork to accomplish the transfer. As it turns out, it is much easier for them to have the prescribing physicians do the work.   Possible solutions 1. You can ask your physician to assist you in weaning yourself off these medications. 2. Ask your pharmacy if the medication is in stock, 3 days prior to your refill. 3. If you need a pharmacy change, let us know at your  medication management visit. Prescriptions that have already been electronically sent to a pharmacy will   not be re-sent to a different pharmacy if your pharmacy of record does not have it in stock. Proper stocking of medication is a pharmacy problem, not a prescriber problem. Work with your pharmacist to solve the problem. 4. Have the [place] State Assembly add a provision to the "STOP ACT" (the law that mandates how controlled substances are prescribed) where there is an exception to the electronic prescribing rule that states that in the event there are shortages of medications the physicians are allowed to use written prescriptions as opposed to electronic ones. This would allow patients to take their prescriptions to a different pharmacy that may have enough medication available to fill the prescription. The problem is that currently there is a law that does not allow for written prescriptions, with the exception of instances where the electronic medical record is down due to technical issues.  5. Have [place] Congress ease the pressure on pharmaceutical companies, allowing them to produce enough quantities of the medication to adequately supply the population. 6. Have pharmacies keep enough stocks of these medications to cover their client base.  7. Have the [place] State Assembly add a provision to the "STOP ACT" where they ease the regulations surrounding the transfer of controlled substances between pharmacies, so as to simplify the transfer of supplies. As an alternative, develop a system to allow patients to obtain the remainder of their prescription at another one of their pharmacies or at an associate pharmacy.   How this shortage will affect you.  Understand that this is a pharmacy supply problem, not a prescriber problem. Work with your pharmacy to solve it. The job of the prescriber is to evaluate and monitor the patient for the appropriate indications and use of these medicines. It is  not the job of the prescriber to supply the medication or to solve problems with that supply. The responsibility and the choice to obtain the medication resides on the patient. By law, supplying the medication is the job of the pharmacy. It is certainly not the job of the prescriber to solve supply problems.   Due to the above problems we are no longer taking patients to write for their pain medication. Future discussions with your physician may include potentially weaning medications or transitioning to alternatives.  We will be focusing primarily on interventional based pain management. We will continue to evaluate for appropriate indications and we may provide recommendations regarding medication, dose, and schedule, as well as monitoring recommendations, however, we will not be taking over the actual prescribing of these substances. On those patients where we are treating their chronic pain with interventional therapies, exceptions will be considered on a case by case basis. At this time, we will try to continue providing this supplemental service to those patients we have been managing in the past. However, as of August 1st, 2023, we no longer will be sending additional prescriptions to other pharmacies for the purpose of solving their supply problems. Once we send a prescription to a pharmacy, we will not be resending it again to another pharmacy to cover for their shortages.   What to do. Write as many letters as you can. Recruit the help of family members in writing these letters. Below are some of the places where you can write to make your voice heard. Let them know what the problem is and push them to look for solutions.   Search internet for: "[place] find your legislators" https://www.ncleg.gov/findyourlegislators  Search internet for: "[place] insurance commissioner   complaints" https://www.ncdoi.gov/contactscomplaints/assistance-or-file-complaint  Search internet for: "[place] Board of Pharmacy complaints" http://www.ncbop.org/contact.htm  Search internet for: "[facility] pharmacy complaints" Email [facility] Customer Relations https://www.[facility].com/help/email-customer-relations.jsp?callType=store  Search internet for: "[facility] pharmacy customer service complaints" https://www.[facility].com/topic/marketing/contactus/contactus_customerservice.jsp  ____________________________________________________________________________________________  ____________________________________________________________________________________________  Drug Holidays (Slow)  What is a "Drug Holiday"? Drug Holiday: is the name given to the period of time during which a patient stops taking a medication(s) for the purpose of eliminating tolerance to the drug.  Benefits Improved effectiveness of opioids. Decreased opioid dose needed to achieve benefits. Improved pain with lesser dose.  What is tolerance? Tolerance: is the progressive decreased in effectiveness of a drug due to its repetitive use. With repetitive use, the body gets use to the medication and as a consequence, it loses its effectiveness. This is a common problem seen with opioid pain medications. As a result, a larger dose of the drug is needed to achieve the same effect that used to be obtained with a smaller dose.  How long should a "Drug Holiday" last? You should stay off of the pain medicine for at least 14 consecutive days. (2 weeks)  Should I stop the medicine "cold turkey"? No. You should always coordinate with your Pain Specialist so that he/she can provide you with the correct medication dose to make the transition as smoothly as possible.  How do I stop the medicine? Slowly. You will be instructed to decrease the daily amount of pills that you take by one (1) pill every seven (7) days. This is called a "slow downward taper" of your dose. For example: if you normally take four (4) pills per day, you will  be asked to drop this dose to three (3) pills per day for seven (7) days, then to two (2) pills per day for seven (7) days, then to one (1) per day for seven (7) days, and at the end of those last seven (7) days, this is when the "Drug Holiday" would start.   Will I have withdrawals? By doing a "slow downward taper" like this one, it is unlikely that you will experience any significant withdrawal symptoms. Typically, what triggers withdrawals is the sudden stop of a high dose opioid therapy. Withdrawals can usually be avoided by slowly decreasing the dose over a prolonged period of time. If you do not follow these instructions and decide to stop your medication abruptly, withdrawals may be possible.  What are withdrawals? Withdrawals: refers to the wide range of symptoms that occur after stopping or dramatically reducing opiate drugs after heavy and prolonged use. Withdrawal symptoms do not occur to patients that use low dose opioids, or those who take the medication sporadically. Contrary to benzodiazepine (example: Valium, Xanax, etc.) or alcohol withdrawals ("Delirium Tremens"), opioid withdrawals are not lethal. Withdrawals are the physical manifestation of the body getting rid of the excess receptors.  Expected Symptoms Early symptoms of withdrawal may include: Agitation Anxiety Muscle aches Increased tearing Insomnia Runny nose Sweating Yawning  Late symptoms of withdrawal may include: Abdominal cramping Diarrhea Dilated pupils Goose bumps Nausea Vomiting  Will I experience withdrawals? Due to the slow nature of the taper, it is very unlikely that you will experience any.  What is a slow taper? Taper: refers to the gradual decrease in dose.  (Last update: 07/03/2020) ____________________________________________________________________________________________   ____________________________________________________________________________________________  CBD (cannabidiol) &  Delta-8 (Delta-8 tetrahydrocannabinol) WARNING  Intro: Cannabidiol (CBD) and tetrahydrocannabinol (THC), are two natural compounds found in plants of the Cannabis genus. They can both be extracted   from hemp or cannabis. Hemp and cannabis come from the Cannabis sativa plant. Both compounds interact with your body's endocannabinoid system, but they have very different effects. CBD does not produce the high sensation associated with cannabis. Delta-8 tetrahydrocannabinol, also known as delta-8 THC, is a psychoactive substance found in the Cannabis sativa plant, of which marijuana and hemp are two varieties. THC is responsible for the high associated with the illicit use of marijuana.  Applicable to: All individuals currently taking or considering taking CBD (cannabidiol) and, more important, all patients taking opioid analgesic controlled substances (pain medication). (Example: oxycodone; oxymorphone; hydrocodone; hydromorphone; morphine; methadone; tramadol; tapentadol; fentanyl; buprenorphine; butorphanol; dextromethorphan; meperidine; codeine; etc.)  Legal status: CBD remains a Schedule I drug prohibited for any use. CBD is illegal with one exception. In the [place], CBD has a limited Food and Drug Administration (FDA) approval for the treatment of two specific types of epilepsy disorders. Only one CBD product has been approved by the FDA for this purpose: "Epidiolex". FDA is aware that some companies are marketing products containing cannabis and cannabis-derived compounds in ways that violate the Federal Food, Drug and Cosmetic Act (FD&C Act) and that may put the health and safety of consumers at risk. The FDA, a Federal agency, has not enforced the CBD status since 2018. UPDATE: (01/30/2022) The Drug Enforcement Agency (DEA) issued a letter stating that "delta" cannabinoids, including Delta-8-THCO and Delta-9-THCO, synthetically derived from hemp do not qualify as hemp and will be viewed as Schedule I  drugs. (Schedule I drugs, substances, or chemicals are defined as drugs with no currently accepted medical use and a high potential for abuse. Some examples of Schedule I drugs are: heroin, lysergic acid diethylamide (LSD), marijuana (cannabis), 3,4-methylenedioxymethamphetamine (ecstasy), methaqualone, and peyote.) (https://www.dea.gov)  Legality: Some manufacturers ship CBD products nationally, which is illegal. Often such products are sold online and are therefore available throughout the country. CBD is openly sold in head shops and health food stores in some states where such sales have not been explicitly legalized. Selling unapproved products with unsubstantiated therapeutic claims is not only a violation of the law, but also can put patients at risk, as these products have not been proven to be safe or effective. Federal illegality makes it difficult to conduct research on CBD.  Reference: "FDA Regulation of Cannabis and Cannabis-Derived Products, Including Cannabidiol (CBD)" - https://www.fda.gov/news-events/public-health-focus/fda-regulation-cannabis-and-cannabis-derived-products-including-cannabidiol-cbd  Warning: CBD is not FDA approved and has not undergo the same manufacturing controls as prescription drugs.  This means that the purity and safety of available CBD may be questionable. Most of the time, despite manufacturer's claims, it is contaminated with THC (delta-9-tetrahydrocannabinol - the chemical in marijuana responsible for the "HIGH").  When this is the case, the THC contaminant will trigger a positive urine drug screen (UDS) test for Marijuana (carboxy-THC). Because a positive UDS for any illicit substance is a violation of our medication agreement, your opioid analgesics (pain medicine) may be permanently discontinued. The FDA recently put out a warning about 5 things that everyone should be aware of regarding Delta-8 THC: Delta-8 THC products have not been evaluated or approved by  the FDA for safe use and may be marketed in ways that put the public health at risk. The FDA has received adverse event reports involving delta-8 THC-containing products. Delta-8 THC has psychoactive and intoxicating effects. Delta-8 THC manufacturing often involve use of potentially harmful chemicals to create the concentrations of delta-8 THC claimed in the marketplace. The final delta-8 THC product may have potentially   harmful by-products (contaminants) due to the chemicals used in the process. Manufacturing of delta-8 THC products may occur in uncontrolled or unsanitary settings, which may lead to the presence of unsafe contaminants or other potentially harmful substances. Delta-8 THC products should be kept out of the reach of children and pets.  MORE ABOUT CBD  General Information: CBD was discovered in 1940 and it is a derivative of the cannabis sativa genus plants (Marijuana and Hemp). It is one of the 113 identified substances found in Marijuana. It accounts for up to 40% of the plant's extract. As of 2018, preliminary clinical studies on CBD included research for the treatment of anxiety, movement disorders, and pain. CBD is available and consumed in multiple forms, including inhalation of smoke or vapor, as an aerosol spray, and by mouth. It may be supplied as an oil containing CBD, capsules, dried cannabis, or as a liquid solution. CBD is thought not to be as psychoactive as THC (delta-9-tetrahydrocannabinol - the chemical in marijuana responsible for the "HIGH"). Studies suggest that CBD may interact with different biological target receptors in the body, including cannabinoid and other neurotransmitter receptors. As of 2018 the mechanism of action for its biological effects has not been determined.  Side-effects  Adverse reactions: Dry mouth, diarrhea, decreased appetite, fatigue, drowsiness, malaise, weakness, sleep disturbances, and others.  Drug interactions: CBC may interact with other  medications such as blood-thinners. Because CBD causes drowsiness on its own, it also increases the drowsiness caused by other medications, including antihistamines (such as Benadryl), benzodiazepines (Xanax, Ativan, Valium), antipsychotics, antidepressants and opioids, as well as alcohol and supplements such as kava, melatonin and St. John's Wort. Be cautious with the following combinations:   Brivaracetam (Briviact) Brivaracetam is changed and broken down by the body. CBD might decrease how quickly the body breaks down brivaracetam. This might increase levels of brivaracetam in the body.  Caffeine Caffeine is changed and broken down by the body. CBD might decrease how quickly the body breaks down caffeine. This might increase levels of caffeine in the body.  Carbamazepine (Tegretol) Carbamazepine is changed and broken down by the body. CBD might decrease how quickly the body breaks down carbamazepine. This might increase levels of carbamazepine in the body and increase its side effects.  Citalopram (Celexa) Citalopram is changed and broken down by the body. CBD might decrease how quickly the body breaks down citalopram. This might increase levels of citalopram in the body and increase its side effects.  Clobazam (Onfi) Clobazam is changed and broken down by the liver. CBD might decrease how quickly the liver breaks down clobazam. This might increase the effects and side effects of clobazam.  Eslicarbazepine (Aptiom) Eslicarbazepine is changed and broken down by the body. CBD might decrease how quickly the body breaks down eslicarbazepine. This might increase levels of eslicarbazepine in the body by a small amount.  Everolimus (Zostress) Everolimus is changed and broken down by the body. CBD might decrease how quickly the body breaks down everolimus. This might increase levels of everolimus in the body.  Lithium Taking higher doses of CBD might increase levels of lithium. This can increase  the risk of lithium toxicity.  Medications changed by the liver (Cytochrome P450 1A1 (CYP1A1) substrates) Some medications are changed and broken down by the liver. CBD might change how quickly the liver breaks down these medications. This could change the effects and side effects of these medications.  Medications changed by the liver (Cytochrome P450 1A2 (CYP1A2) substrates) Some medications are   changed and broken down by the liver. CBD might change how quickly the liver breaks down these medications. This could change the effects and side effects of these medications.  Medications changed by the liver (Cytochrome P450 1B1 (CYP1B1) substrates) Some medications are changed and broken down by the liver. CBD might change how quickly the liver breaks down these medications. This could change the effects and side effects of these medications.  Medications changed by the liver (Cytochrome P450 2A6 (CYP2A6) substrates) Some medications are changed and broken down by the liver. CBD might change how quickly the liver breaks down these medications. This could change the effects and side effects of these medications.  Medications changed by the liver (Cytochrome P450 2B6 (CYP2B6) substrates) Some medications are changed and broken down by the liver. CBD might change how quickly the liver breaks down these medications. This could change the effects and side effects of these medications.  Medications changed by the liver (Cytochrome P450 2C19 (CYP2C19) substrates) Some medications are changed and broken down by the liver. CBD might change how quickly the liver breaks down these medications. This could change the effects and side effects of these medications.  Medications changed by the liver (Cytochrome P450 2C8 (CYP2C8) substrates) Some medications are changed and broken down by the liver. CBD might change how quickly the liver breaks down these medications. This could change the effects and side effects  of these medications.  Medications changed by the liver (Cytochrome P450 2C9 (CYP2C9) substrates) Some medications are changed and broken down by the liver. CBD might change how quickly the liver breaks down these medications. This could change the effects and side effects of these medications.  Medications changed by the liver (Cytochrome P450 2D6 (CYP2D6) substrates) Some medications are changed and broken down by the liver. CBD might change how quickly the liver breaks down these medications. This could change the effects and side effects of these medications.  Medications changed by the liver (Cytochrome P450 2E1 (CYP2E1) substrates) Some medications are changed and broken down by the liver. CBD might change how quickly the liver breaks down these medications. This could change the effects and side effects of these medications.  Medications changed by the liver (Cytochrome P450 3A4 (CYP3A4) substrates) Some medications are changed and broken down by the liver. CBD might change how quickly the liver breaks down these medications. This could change the effects and side effects of these medications.  Medications changed by the liver (Glucuronidated drugs) Some medications are changed and broken down by the liver. CBD might change how quickly the liver breaks down these medications. This could change the effects and side effects of these medications.  Medications that decrease the breakdown of other medications by the liver (Cytochrome P450 2C19 (CYP2C19) inhibitors) CBD is changed and broken down by the liver. Some drugs decrease how quickly the liver changes and breaks down CBD. This could change the effects and side effects of CBD.  Medications that decrease the breakdown of other medications in the liver (Cytochrome P450 3A4 (CYP3A4) inhibitors) CBD is changed and broken down by the liver. Some drugs decrease how quickly the liver changes and breaks down CBD. This could change the effects  and side effects of CBD.  Medications that increase breakdown of other medications by the liver (Cytochrome P450 3A4 (CYP3A4) inducers) CBD is changed and broken down by the liver. Some drugs increase how quickly the liver changes and breaks down CBD. This could change the effects and side effects of   CBD.  Medications that increase the breakdown of other medications by the liver (Cytochrome P450 2C19 (CYP2C19) inducers) CBD is changed and broken down by the liver. Some drugs increase how quickly the liver changes and breaks down CBD. This could change the effects and side effects of CBD.  Methadone (Dolophine) Methadone is broken down by the liver. CBD might decrease how quickly the liver breaks down methadone. Taking cannabidiol along with methadone might increase the effects and side effects of methadone.  Rufinamide (Banzel) Rufinamide is changed and broken down by the body. CBD might decrease how quickly the body breaks down rufinamide. This might increase levels of rufinamide in the body by a small amount.  Sedative medications (CNS depressants) CBD might cause sleepiness and slowed breathing. Some medications, called sedatives, can also cause sleepiness and slowed breathing. Taking CBD with sedative medications might cause breathing problems and/or too much sleepiness.  Sirolimus (Rapamune) Sirolimus is changed and broken down by the body. CBD might decrease how quickly the body breaks down sirolimus. This might increase levels of sirolimus in the body.  Stiripentol (Diacomit) Stiripentol is changed and broken down by the body. CBD might decrease how quickly the body breaks down stiripentol. This might increase levels of stiripentol in the body and increase its side effects.  Tacrolimus (Prograf) Tacrolimus is changed and broken down by the body. CBD might decrease how quickly the body breaks down tacrolimus. This might increase levels of tacrolimus in the body.  Tamoxifen  (Soltamox) Tamoxifen is changed and broken down by the body. CBD might affect how quickly the body breaks down tamoxifen. This might affect levels of tamoxifen in the body.  Topiramate (Topamax) Topiramate is changed and broken down by the body. CBD might decrease how quickly the body breaks down topiramate. This might increase levels of topiramate in the body by a small amount.  Valproate Valproic acid can cause liver injury. Taking cannabidiol with valproic acid might increase the chance of liver injury. CBD and/or valproic acid might need to be stopped, or the dose might need to be reduced.  Warfarin (Coumadin) CBD might increase levels of warfarin, which can increase the risk for bleeding. CBD and/or warfarin might need to be stopped, or the dose might need to be reduced.  Zonisamide Zonisamide is changed and broken down by the body. CBD might decrease how quickly the body breaks down zonisamide. This might increase levels of zonisamide in the body by a small amount. (Last update: 02/11/2022) ____________________________________________________________________________________________  Naloxone Nasal Spray What is this medication? NALOXONE (nal OX one) treats opioid overdose, which causes slow or shallow breathing, severe drowsiness, or trouble staying awake. Call emergency services after using this medication. You may need additional treatment. Naloxone works by reversing the effects of opioids. It belongs to a group of medications called opioid blockers. This medicine may be used for other purposes; ask your health care provider or pharmacist if you have questions. COMMON BRAND NAME(S): Kloxxado, Narcan What should I tell my care team before I take this medication? They need to know if you have any of these conditions: Heart disease Substance use disorder An unusual or allergic reaction to naloxone, other medications, foods, dyes, or preservatives Pregnant or trying to get  pregnant Breast-feeding How should I use this medication? This medication is for use in the nose. Lay the person on their back. Support their neck with your hand and allow the head to tilt back before giving the medication. The nasal spray should be given into   1 nostril. After giving the medication, move the person onto their side. Do not remove or test the nasal spray until ready to use. Get emergency medical help right away after giving the first dose of this medication, even if the person wakes up. You should be familiar with how to recognize the signs and symptoms of a narcotic overdose. If more doses are needed, give the additional dose in the other nostril. Talk to your care team about the use of this medication in children. While this medication may be prescribed for children as young as newborns for selected conditions, precautions do apply. Overdosage: If you think you have taken too much of this medicine contact a poison control center or emergency room at once. NOTE: This medicine is only for you. Do not share this medicine with others. What if I miss a dose? This does not apply. What may interact with this medication? This is only used during an emergency. No interactions are expected during emergency use. This list may not describe all possible interactions. Give your health care provider a list of all the medicines, herbs, non-prescription drugs, or dietary supplements you use. Also tell them if you smoke, drink alcohol, or use illegal drugs. Some items may interact with your medicine. What should I watch for while using this medication? Keep this medication ready for use in the case of an opioid overdose. Make sure that you have the phone number of your care team and local hospital ready. You may need to have additional doses of this medication. Each nasal spray contains a single dose. Some emergencies may require additional doses. After use, bring the treated person to the nearest  hospital or call 911. Make sure the treating care team knows that the person has received a dose of this medication. You will receive additional instructions on what to do during and after use of this medication before an emergency occurs. What side effects may I notice from receiving this medication? Side effects that you should report to your care team as soon as possible: Allergic reactions--skin rash, itching, hives, swelling of the face, lips, tongue, or throat Side effects that usually do not require medical attention (report these to your care team if they continue or are bothersome): Constipation Dryness or irritation inside the nose Headache Increase in blood pressure Muscle spasms Stuffy nose Toothache This list may not describe all possible side effects. Call your doctor for medical advice about side effects. You may report side effects to FDA at 1-800-FDA-1088. Where should I keep my medication? Keep out of the reach of children and pets. Store between 20 and 25 degrees C (68 and 77 degrees F). Do not freeze. Throw away any unused medication after the expiration date. Keep in original box until ready to use. NOTE: This sheet is a summary. It may not cover all possible information. If you have questions about this medicine, talk to your doctor, pharmacist, or health care provider.  2023 [employer]/Gold Standard (2021-08-08 00:00:00)  

## 2022-10-13 NOTE — Progress Notes (Unsigned)
PROVIDER NOTE: Information contained herein reflects review and annotations entered in association with encounter. Interpretation of such information and data should be left to medically-trained personnel. Information provided to patient can be located elsewhere in the medical record under "Patient Instructions". Document created using STT-dictation technology, any transcriptional errors that may result from process are unintentional.    Patient: Larry Goodwin  Service Category: E/M  Provider: Gaspar Cola, MD  DOB: 07/22/1976  DOS: 10/14/2022  Referring Provider: Ria Bush, MD  MRN: [id number]  Specialty: Interventional Pain Management  PCP: Ria Bush, MD  Type: Established Patient  Setting: Ambulatory outpatient    Location: Office  Delivery: Face-to-face     HPI  Mr. CARNIE BRUEMMER, a 46 y.o. year old male, is here today because of his Chronic pain syndrome [G89.4]. Mr. Vanpatten's primary complain today is No chief complaint on file. Last encounter: My last encounter with him was on 07/20/2022. Pertinent problems: Mr. Melott has Lumbar DDD (degenerative disc disease); Chronic pain syndrome; Myofascial pain syndrome; Lumbar facet syndrome (Bilateral) (R>L); Chronic upper back pain (3ry area of Pain); Lumbar spondylosis with radiculopathy; Epidural fibrosis; Failed back surgical syndrome; Chronic low back pain (1ry area of Pain) (Bilateral) (R>L); Chronic sacroiliac joint pain (Right); Abnormal MRI, lumbar spine;  Abnormal CT myelogram of the thoracic spine; Thoracic disc herniation; Bulge of lumbar disc without myelopathy; Vertebral body hemangioma; Cervical IVDD (intervertebral disc displacement); Cervical foraminal stenosis (C5-6 and C7-T1: Right); Cervical central spinal stenosis (C5-6); Thoracic IVDD (intervertebral disc displacement); Thoracic facet arthropathy; Thoracic facet syndrome ([place]); Lumbar IVDD (intervertebral disc displacement); Lumbar foraminal stenosis (Right  L4-5); Lumbar central spinal stenosis (L3-4 and L4-5); Chronic knee pain (Right); Osteoarthritis of knee (Right); Chronic hip pain (2ry area of Pain) (Right); Chronic shoulder pain (4th area of Pain) (Bilateral) (Right); and Epigastric pain on their pertinent problem list. Pain Assessment: Severity of   is reported as a  /10. Location:    / . Onset:  . Quality:  . Timing:  . Modifying factor(s):  Marland Kitchen Vitals:  vitals were not taken for this visit.   Reason for encounter: medication management. ***  RTCB: 01/23/2023 Nonopioids transfer 10/28/2020: Magnesium (OTC)  Pharmacotherapy Assessment  Analgesic: Oxycodone IR 5 mg, 1 tab PO q 6 hrs (20 mg/day of oxycodone) MME/day: 30 mg/day.   Monitoring: [place] PMP: PDMP reviewed during this encounter.       Pharmacotherapy: No side-effects or adverse reactions reported. Compliance: No problems identified. Effectiveness: Clinically acceptable.  No notes on file  No results found for: "CBDTHCR" No results found for: "D8THCCBX" No results found for: "D9THCCBX"  UDS:  Summary  Date Value Ref Range Status  04/22/2022 Note  Final    Comment:    ==================================================================== [facility] (MW) ==================================================================== Test                             Result       Flag       Units  Drug Present and Declared for Prescription Verification   Oxycodone                      738          EXPECTED   ng/mg creat   Oxymorphone                    1021         EXPECTED  ng/mg creat   Noroxycodone                   1470         EXPECTED   ng/mg creat   Noroxymorphone                 406          EXPECTED   ng/mg creat    Sources of oxycodone are scheduled prescription medications.    Oxymorphone, noroxycodone, and noroxymorphone are expected    metabolites of oxycodone. Oxymorphone is also available as a    scheduled prescription  medication.  ==================================================================== Test                      Result    Flag   Units      Ref Range   Creatinine              53               mg/dL      >=20 ==================================================================== Declared Medications:  The flagging and interpretation on this report are based on the  following declared medications.  Unexpected results may arise from  inaccuracies in the declared medications.   **Note: The testing scope of this panel includes these medications:   Oxycodone   **Note: The testing scope of this panel does not include the  following reported medications:   Cyanocobalamin  Lisinopril (Zestril)  Magnesium (Mag-Ox)  Nystatin (Mycostatin)  Sildenafil (Viagra)  Vitamin D3 ==================================================================== For clinical consultation, please call [phone]. ====================================================================       ROS  Constitutional: Denies any fever or chills Gastrointestinal: No reported hemesis, hematochezia, vomiting, or acute GI distress Musculoskeletal: Denies any acute onset joint swelling, redness, loss of ROM, or weakness Neurological: No reported episodes of acute onset apraxia, aphasia, dysarthria, agnosia, amnesia, paralysis, loss of coordination, or loss of consciousness  Medication Review  Magnesium Oxide -Mg Supplement, Vitamin D3, cyanocobalamin, lisinopril, metFORMIN, nystatin cream, oxyCODONE, and sildenafil  History Review  Allergy: Mr. Word is allergic to levaquin [levofloxacin in d5w]. Drug: Mr. Yeakle  reports current drug use. Drug: Oxycodone. Alcohol:  reports no history of alcohol use. Tobacco:  reports that he quit smoking about 14 years ago. His smoking use included cigarettes. He has never used smokeless tobacco. Social: Mr. Seidman  reports that he quit smoking about 14 years ago. His smoking use  included cigarettes. He has never used smokeless tobacco. He reports current drug use. Drug: Oxycodone. He reports that he does not drink alcohol. Medical:  has a past medical history of Bell's palsy (2015), Chronic pain syndrome, DDD (degenerative disc disease), lumbar, HTN (hypertension), and Morbid obesity ([place]) (08/16/2014). Surgical: Mr. Hoglund  has a past surgical history that includes Back surgery (2012) and Cholecystectomy. Family: family history includes COPD in his mother; Hypertension in an other family member.  Laboratory Chemistry Profile   Renal Lab Results  Component Value Date   BUN 12 04/22/2022   CREATININE 0.83 83/72/9021   BCR NOT APPLICABLE 11/55/2080   GFR 104.88 04/22/2022   GFRAA 129 02/06/2019   GFRNONAA 112 02/06/2019    Hepatic Lab Results  Component Value Date   AST 14 04/22/2022   ALT 17 04/22/2022   ALBUMIN 4.4 04/22/2022   ALKPHOS 63 04/22/2022   LIPASE 153 09/21/2012    Electrolytes Lab Results  Component Value Date   NA 133 (L) 04/22/2022   K 5.1  04/22/2022   CL 96 04/22/2022   CALCIUM 9.2 04/22/2022   MG 1.8 04/22/2022    Bone Lab Results  Component Value Date   VD25OH 43.95 04/22/2022   25OHVITD1 35 02/06/2019   25OHVITD2 <1.0 02/06/2019   25OHVITD3 34 02/06/2019    Inflammation (CRP: Acute Phase) (ESR: Chronic Phase) Lab Results  Component Value Date   CRP 9 02/06/2019   ESRSEDRATE 6 02/06/2019         Note: Above Lab results reviewed.  Recent Imaging Review  DG Foot Complete Right CLINICAL DATA:  Jarring foot 3 days prior, no fall  EXAM: RIGHT FOOT COMPLETE - 3+ VIEW  COMPARISON:  Radiograph 03/24/2012  FINDINGS: No acute bony abnormality. Specifically, no fracture, subluxation, or dislocation. Mild degenerative changes are noted in the midfoot. Posterior calcaneal spur is present. Trace ankle effusion. Soft tissues are otherwise unremarkable  IMPRESSION: No acute fracture or traumatic malalignment.  Mild  degenerative changes in the midfoot.  Posterior calcaneal spur.  Trace ankle effusion.  Electronically Signed   By: Lovena Le M.D.   On: 02/12/2020 19:03 Note: Reviewed        Physical Exam  General appearance: Well nourished, well developed, and well hydrated. In no apparent acute distress Mental status: Alert, oriented x 3 (person, place, & time)       Respiratory: No evidence of acute respiratory distress Eyes: PERLA Vitals: There were no vitals taken for this visit. BMI: Estimated body mass index is 41.09 kg/m as calculated from the following:   Height as of 07/20/22: _0  (1.88 m).   Weight as of 07/20/22: 320 lb (145.2 kg). Ideal: Patient weight not recorded  Assessment   Diagnosis Status  1. Chronic pain syndrome   2. Chronic low back pain (1ry area of Pain) (Bilateral) (R>L)   3. Chronic hip pain (2ry area of Pain) (Right)   4. Chronic knee pain (Right)   5. Chronic sacroiliac joint pain (Right)   6. Chronic upper back pain (3ry area of Pain)   7. Chronic shoulder pain (4th area of Pain) (Bilateral) (Right)   8. Pharmacologic therapy   9. Chronic use of opiate for therapeutic purpose   10. Encounter for medication management   11. Encounter for chronic pain management    Controlled Controlled Controlled   Updated Problems: No problems updated.   Plan of Care  Problem-specific:  No problem-specific Assessment & Plan notes found for this encounter.  Mr. MANINDER DEBOER has a current medication list which includes the following long-term medication(s): lisinopril, magnesium oxide -mg supplement, metformin, oxycodone, and sildenafil.  Pharmacotherapy (Medications Ordered): No orders of the defined types were placed in this encounter.  Orders:  No orders of the defined types were placed in this encounter.  Follow-up plan:   No follow-ups on file.     Interventional Therapies  Risk  Complexity Considerations:   Estimated body mass index is 38.52  kg/m as calculated from the following:   Height as of this encounter: _1  (1.88 m).   Weight as of this encounter: 300 lb (136.1 kg). WNL   Planned  Pending:      Under consideration:   None at this time.   Completed:   None since joining practice in 02/06/2019   Therapeutic  Palliative (PRN) options:   None established      Recent Visits Date Type Provider Dept  07/20/22 Office Visit Milinda Pointer, MD [facility]  Showing recent visits within past  90 days and meeting all other requirements Future Appointments Date Type Provider Dept  10/14/22 Appointment Milinda Pointer, MD [facility]  Showing future appointments within next 90 days and meeting all other requirements  I discussed the assessment and treatment plan with the patient. The patient was provided an opportunity to ask questions and all were answered. The patient agreed with the plan and demonstrated an understanding of the instructions.  Patient advised to call back or seek an in-person evaluation if the symptoms or condition worsens.  Duration of encounter: *** minutes.  Total time on encounter, as per AMA guidelines included both the face-to-face and non-face-to-face time personally spent by the physician and/or other qualified health care professional(s) on the day of the encounter (includes time in activities that require the physician or other qualified health care professional and does not include time in activities normally performed by clinical staff). Physician's time may include the following activities when performed: preparing to see the patient (eg, review of tests, pre-charting review of records) obtaining and/or reviewing separately obtained history performing a medically appropriate examination and/or evaluation counseling and educating the patient/family/caregiver ordering medications, tests, or procedures referring and communicating with other health care  professionals (when not separately reported) documenting clinical information in the electronic or other health record independently interpreting results (not separately reported) and communicating results to the patient/ family/caregiver care coordination (not separately reported)  Note by: Gaspar Cola, MD Date: 10/14/2022; Time: 8:22 AM

## 2022-10-14 ENCOUNTER — Ambulatory Visit: Payer: Medicare Other | Attending: Pain Medicine | Admitting: Pain Medicine

## 2022-10-14 ENCOUNTER — Encounter: Payer: Self-pay | Admitting: Pain Medicine

## 2022-10-14 VITALS — BP 130/99 | HR 86 | Temp 98.1°F | Resp 18 | Ht 74.0 in | Wt 320.0 lb

## 2022-10-14 DIAGNOSIS — Z79891 Long term (current) use of opiate analgesic: Secondary | ICD-10-CM | POA: Insufficient documentation

## 2022-10-14 DIAGNOSIS — M545 Low back pain, unspecified: Secondary | ICD-10-CM | POA: Diagnosis present

## 2022-10-14 DIAGNOSIS — M25561 Pain in right knee: Secondary | ICD-10-CM | POA: Insufficient documentation

## 2022-10-14 DIAGNOSIS — M25511 Pain in right shoulder: Secondary | ICD-10-CM | POA: Insufficient documentation

## 2022-10-14 DIAGNOSIS — M25551 Pain in right hip: Secondary | ICD-10-CM | POA: Insufficient documentation

## 2022-10-14 DIAGNOSIS — G894 Chronic pain syndrome: Secondary | ICD-10-CM | POA: Insufficient documentation

## 2022-10-14 DIAGNOSIS — M25512 Pain in left shoulder: Secondary | ICD-10-CM | POA: Insufficient documentation

## 2022-10-14 DIAGNOSIS — G8929 Other chronic pain: Secondary | ICD-10-CM | POA: Insufficient documentation

## 2022-10-14 DIAGNOSIS — M549 Dorsalgia, unspecified: Secondary | ICD-10-CM | POA: Diagnosis present

## 2022-10-14 DIAGNOSIS — Z79899 Other long term (current) drug therapy: Secondary | ICD-10-CM | POA: Diagnosis present

## 2022-10-14 DIAGNOSIS — M533 Sacrococcygeal disorders, not elsewhere classified: Secondary | ICD-10-CM | POA: Diagnosis present

## 2022-10-14 MED ORDER — OXYCODONE HCL 5 MG PO TABS: 5.0000 mg | ORAL_TABLET | Freq: Four times a day (QID) | ORAL | 0 refills | Status: DC | PRN

## 2022-10-14 MED ORDER — NALOXONE HCL 4 MG/0.1ML NA LIQD: 1.0000 | NASAL | 0 refills | Status: DC | PRN

## 2022-10-14 NOTE — Progress Notes (Signed)
Nursing Pain Medication Assessment:  Safety precautions to be maintained throughout the outpatient stay will include: orient to surroundings, keep bed in low position, maintain call bell within reach at all times, provide assistance with transfer out of bed and ambulation.  Medication Inspection Compliance: Pill count conducted under aseptic conditions, in front of the patient. Neither the pills nor the bottle was removed from the patient's sight at any time. Once count was completed pills were immediately returned to the patient in their original bottle.  Medication: Oxycodone IR Pill/Patch Count:  63 of 120 pills remain Pill/Patch Appearance: Markings consistent with prescribed medication Bottle Appearance: Standard pharmacy container. Clearly labeled. Filled Date: 30 / 18 / 2023 Last Medication intake:  Today

## 2022-11-17 ENCOUNTER — Telehealth (INDEPENDENT_AMBULATORY_CARE_PROVIDER_SITE_OTHER): Payer: Medicare Other | Admitting: Internal Medicine

## 2022-11-17 ENCOUNTER — Encounter: Payer: Self-pay | Admitting: Internal Medicine

## 2022-11-17 DIAGNOSIS — J0140 Acute pansinusitis, unspecified: Secondary | ICD-10-CM

## 2022-11-17 MED ORDER — AMOXICILLIN-POT CLAVULANATE 875-125 MG PO TABS: 1.0000 | ORAL_TABLET | Freq: Two times a day (BID) | ORAL | 0 refills | Status: DC

## 2022-11-17 NOTE — Progress Notes (Signed)
Subjective:    Patient ID: Larry Goodwin, male    DOB: 06/09/1976, 46 y.o.   MRN: [id number]  HPI Video virtual visit due to respiratory infection Identification done Reviewed limitations and billing and he gave consent Participants---patient in his home and I am in my office  Has been sick for about 1.5 weeks Gets yearly bad sinus infection--having it now Stuffiness at first--tried to wait it out Now worsening Ears are popping, dizzy feeling Not much cough Face is tight and head is pounding----maxillary to back of neck Slight sore throat Fever last night---shakes No SOB  Hasn't tried anything  Current Outpatient Medications on File Prior to Visit  Medication Sig Dispense Refill   Cholecalciferol (VITAMIN D3) 125 MCG (5000 UT) CAPS Take 1 capsule (5,000 Units total) by mouth in the morning and at bedtime.     lisinopril (ZESTRIL) 40 MG tablet Take 1 tablet (40 mg total) by mouth daily. 90 tablet 3   nystatin cream (MYCOSTATIN) Apply topically 2 (two) times daily. Use for a week 30 g 0   oxyCODONE (OXY IR/ROXICODONE) 5 MG immediate release tablet Take 1 tablet (5 mg total) by mouth every 6 (six) hours as needed for severe pain. Must last 30 days 120 tablet 0   [START ON 11/24/2022] oxyCODONE (OXY IR/ROXICODONE) 5 MG immediate release tablet Take 1 tablet (5 mg total) by mouth every 6 (six) hours as needed for severe pain. Must last 30 days 120 tablet 0   [START ON 12/24/2022] oxyCODONE (OXY IR/ROXICODONE) 5 MG immediate release tablet Take 1 tablet (5 mg total) by mouth every 6 (six) hours as needed for severe pain. Must last 30 days 120 tablet 0   sildenafil (VIAGRA) 100 MG tablet Take 0.5-1 tablets (50-100 mg total) by mouth daily as needed for erectile dysfunction. 5 tablet 3   vitamin B-12 (CYANOCOBALAMIN) 1000 MCG tablet Take 1,000 mcg by mouth daily.     Magnesium Oxide 500 MG CAPS Take 1 capsule (500 mg total) by mouth daily. 30 capsule 2   metFORMIN (GLUCOPHAGE) 500 MG  tablet Take 1 tablet (500 mg total) by mouth daily with breakfast. (Patient not taking: Reported on 11/17/2022) 90 tablet 1   naloxone (NARCAN) nasal spray 4 mg/0.1 mL Place 1 spray into the nose as needed for up to 365 doses (for opioid-induced respiratory depresssion). In case of emergency (overdose), spray once into each nostril. If no response within 3 minutes, repeat application and call 128. (Patient not taking: Reported on 11/17/2022) 1 each 0   No current facility-administered medications on file prior to visit.    Allergies  Allergen Reactions   Levaquin [Levofloxacin In D5w] Other (See Comments)    Numbness in feet and hands    Past Medical History:  Diagnosis Date   Bell's palsy 2015   right then left sides   Chronic pain syndrome    s/p 2 back surgeries [facility])   DDD (degenerative disc disease), lumbar    severe s/p 2 surgeries   HTN (hypertension)    Morbid obesity ([place]) 08/16/2014    Past Surgical History:  Procedure Laterality Date   BACK SURGERY  2012   x 2 [facility])   CHOLECYSTECTOMY      Family History  Problem Relation Age of Onset   COPD Mother        smoker (deceased at 94)   Hypertension Other    Stroke Neg Hx    Cancer Neg Hx    Diabetes Neg  Hx    CAD Neg Hx     Social History   Socioeconomic History   Marital status: Married    Spouse name: Not on file   Number of children: Not on file   Years of education: Not on file   Highest education level: Not on file  Occupational History   Not on file  Tobacco Use   Smoking status: Former    Types: Cigarettes    Quit date: 12/15/2007    Years since quitting: 14.9   Smokeless tobacco: Never   Tobacco comments:    quit 6 years ago  Vaping Use   Vaping Use: Never used  Substance and Sexual Activity   Alcohol use: No    Alcohol/week: 0.0 standard drinks of alcohol   Drug use: Yes    Types: Oxycodone   Sexual activity: Yes  Other Topics Concern   Not on file  Social History Narrative   Lives  with wife and 3 children, 2 dogs   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[employer], on disability due to back   Edu: HS   Activity: coaches   Diet: good water, fruits/vegetables daily   Social Determinants of Health   Financial Resource Strain: [place]  (12/29/2021)   Overall Financial Resource Strain (CARDIA)    Difficulty of Paying Living Expenses: Not hard at all  Food Insecurity: No Food Insecurity (12/29/2021)   Hunger Vital Sign    Worried About Running Out of Food in the Last Year: Never true    [place] in the Last Year: Never true  Transportation Needs: No Transportation Needs (12/29/2021)   PRAPARE - Hydrologist (Medical): No    Lack of Transportation (Non-Medical): No  Physical Activity: Sufficiently Active (12/29/2021)   Exercise Vital Sign    Days of Exercise per Week: 3 days    Minutes of Exercise per Session: 60 min  Stress: No Stress Concern Present (12/29/2021)   [place]    Feeling of Stress : Not at all  Social Connections: Moderately Integrated (12/29/2021)   Social Connection and Isolation Panel [NHANES]    Frequency of Communication with Friends and Family: More than three times a week    Frequency of Social Gatherings with Friends and Family: More than three times a week    Attends Religious Services: More than 4 times per year    Active Member of [employer] or Organizations: No    Attends Archivist Meetings: Never    Marital Status: Married  Human resources officer Violence: Not At Risk (12/29/2021)   Humiliation, Afraid, Rape, and Kick questionnaire    Fear of Current or Ex-Partner: No    Emotionally Abused: No    Physically Abused: No    Sexually Abused: No    Review of Systems No N/V or diarrhea Eating okay    Objective:   Physical Exam Constitutional:      Appearance: Normal appearance.  Pulmonary:     Effort: Pulmonary effort is normal. No  respiratory distress.  Neurological:     Mental Status: He is alert.            Assessment & Plan:

## 2022-11-17 NOTE — Assessment & Plan Note (Signed)
Has tried to see if it would resolve--and now going on 10 days Recommended tylenol prn Will try augmentin 875 bid x 7 days

## 2022-12-23 ENCOUNTER — Other Ambulatory Visit: Payer: Self-pay | Admitting: Family Medicine

## 2022-12-23 DIAGNOSIS — R238 Other skin changes: Secondary | ICD-10-CM

## 2022-12-23 NOTE — Telephone Encounter (Signed)
Nystatin cream Last filled: 10/09/22, #30 g Last OV: 04/22/22, CPE Next OV: 12/30/22, CPE;  However, 04/22/22 OV notes instructions says schedule next wellness visit after 12/29/22, then schedule in about 1 yr around 04/23/23. Plz advise if pt needs to r/s next [facility] wellness.

## 2022-12-30 ENCOUNTER — Encounter: Payer: Medicare Other | Admitting: Family Medicine

## 2023-01-11 NOTE — Progress Notes (Unsigned)
PROVIDER NOTE: Information contained herein reflects review and annotations entered in association with encounter. Interpretation of such information and data should be left to medically-trained personnel. Information provided to patient can be located elsewhere in the medical record under "Patient Instructions". Document created using STT-dictation technology, any transcriptional errors that may result from process are unintentional.    Patient: Larry Goodwin  Service Category: E/M  Provider: Gaspar Cola, MD  DOB: 06-Jan-1976  DOS: 01/13/2023  Referring Provider: Ria Bush, MD  MRN: [id number]  Specialty: Interventional Pain Management  PCP: Ria Bush, MD  Type: Established Patient  Setting: Ambulatory outpatient    Location: Office  Delivery: Face-to-face     HPI  [name], a 47 y.o. year old male, is here today because of his No primary diagnosis found.. [name]'s primary complain today is No chief complaint on file. Last encounter: My last encounter with him was on 10/14/2022. Pertinent problems: Mr. 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 Pain Assessment: Severity of   is reported as a  /10. Location:    / . Onset:  . Quality:  . Timing:  . Modifying factor(s):  Marland Kitchen Vitals:  vitals were not taken for this visit.  BMI: Estimated body mass index is 41.09 kg/m as calculated from the following:   Height as of 10/14/22: '6\' 2"'$  (1.88 m).   Weight as of 10/14/22: 320 lb (145.2 kg).  Reason for encounter: medication management. ***  RTCB: 04/27/2023  Nonopioids transfer 10/28/2020: Magnesium (OTC)  Pharmacotherapy Assessment  Analgesic: Oxycodone IR 5 mg, 1 tab PO q 6 hrs (20 mg/day of oxycodone) MME/day: 30 mg/day.   Monitoring: [place] PMP: PDMP reviewed during this encounter.       Pharmacotherapy: No side-effects or adverse reactions reported. Compliance: No problems identified. Effectiveness: Clinically acceptable.  No notes on file  No results found for: "CBDTHCR" No results found for: "D8THCCBX" No results found for: "D9THCCBX"  UDS:  Summary  Date Value Ref Range Status  04/22/2022 Note  Final    Comment:    ==================================================================== [facility] (MW) ==================================================================== Test                             Result       Flag       Units  Drug Present and Declared for Prescription Verification   Oxycodone                      738          EXPECTED   ng/mg  creat   Oxymorphone                    1021         EXPECTED   ng/mg creat   Noroxycodone                   1470         EXPECTED   ng/mg creat   Noroxymorphone                 406          EXPECTED   ng/mg creat    Sources of oxycodone are scheduled prescription medications.    Oxymorphone, noroxycodone,  and noroxymorphone are expected    metabolites of oxycodone. Oxymorphone is also available as a    scheduled prescription medication.  ==================================================================== Test                      Result    Flag   Units      Ref Range   Creatinine              53               mg/dL      >=20 ==================================================================== Declared Medications:  The flagging and interpretation on this report are based on the  following declared medications.  Unexpected results may arise from  inaccuracies in the declared medications.   **Note: The testing scope of this panel includes these medications:   Oxycodone   **Note: The testing scope of this panel does not include the  following reported medications:   Cyanocobalamin  Lisinopril (Zestril)  Magnesium (Mag-Ox)  Nystatin (Mycostatin)  Sildenafil (Viagra)  Vitamin D3 ==================================================================== For clinical consultation, please call [phone]. ====================================================================       ROS  Constitutional: Denies any fever or chills Gastrointestinal: No reported hemesis, hematochezia, vomiting, or acute GI distress Musculoskeletal: Denies any acute onset joint swelling, redness, loss of ROM, or weakness Neurological: No reported episodes of acute onset apraxia, aphasia, dysarthria, agnosia, amnesia, paralysis, loss of coordination, or loss of consciousness  Medication Review  Magnesium Oxide -Mg Supplement, Vitamin D3, amoxicillin-clavulanate, cyanocobalamin, lisinopril, metFORMIN, naloxone, nystatin cream, oxyCODONE, and sildenafil  History Review  Allergy: [name] is allergic to levaquin [levofloxacin in d5w]. Drug: [name]  reports current drug use. Drug: Oxycodone. Alcohol:  reports no history of alcohol use. Tobacco:  reports that he quit smoking about 15 years ago. His  smoking use included cigarettes. He has never used smokeless tobacco. Social: [name]  reports that he quit smoking about 15 years ago. His smoking use included cigarettes. He has never used smokeless tobacco. He reports current drug use. Drug: Oxycodone. He reports that he does not drink alcohol. Medical:  has a past medical history of Bell's palsy (2015), Chronic pain syndrome, DDD (degenerative disc disease), lumbar, HTN (hypertension), and Morbid obesity ([place]) (08/16/2014). Surgical: [name]  has a past surgical history that includes Back surgery (2012) and Cholecystectomy. Family: family history includes COPD in his mother; Hypertension in an other family member.  Laboratory Chemistry Profile   Renal Lab Results  Component Value Date   BUN 12 04/22/2022   CREATININE 0.83 26/37/8588   BCR NOT APPLICABLE 50/27/7412   GFR 104.88 04/22/2022   GFRAA 129 02/06/2019   GFRNONAA 112 02/06/2019    Hepatic Lab Results  Component Value Date   AST 14 04/22/2022   ALT  17 04/22/2022   ALBUMIN 4.4 04/22/2022   ALKPHOS 63 04/22/2022   LIPASE 153 09/21/2012    Electrolytes Lab Results  Component Value Date   NA 133 (L) 04/22/2022   K 5.1 04/22/2022   CL 96 04/22/2022   CALCIUM 9.2 04/22/2022   MG 1.8 04/22/2022    Bone Lab Results  Component Value Date   VD25OH 43.95 04/22/2022   25OHVITD1 35 02/06/2019   25OHVITD2 <1.0 02/06/2019   25OHVITD3 34 02/06/2019    Inflammation (CRP: Acute Phase) (ESR: Chronic Phase) Lab Results  Component Value Date   CRP 9 02/06/2019   ESRSEDRATE 6 02/06/2019         Note: Above Lab results reviewed.  Recent Imaging Review  DG Foot Complete Right CLINICAL DATA:  Jarring foot 3 days prior, no fall  EXAM: RIGHT FOOT COMPLETE - 3+ VIEW  COMPARISON:  Radiograph 03/24/2012  FINDINGS: No acute bony abnormality. Specifically, no fracture, subluxation, or dislocation. Mild degenerative changes are noted in the midfoot. Posterior calcaneal  spur is present. Trace ankle effusion. Soft tissues are otherwise unremarkable  IMPRESSION: No acute fracture or traumatic malalignment.  Mild degenerative changes in the midfoot.  Posterior calcaneal spur.  Trace ankle effusion.  Electronically Signed   By: Lovena Le M.D.   On: 02/12/2020 19:03 Note: Reviewed        Physical Exam  General appearance: Well nourished, well developed, and well hydrated. In no apparent acute distress Mental status: Alert, oriented x 3 (person, place, & time)       Respiratory: No evidence of acute respiratory distress Eyes: PERLA Vitals: There were no vitals taken for this visit. BMI: Estimated body mass index is 41.09 kg/m as calculated from the following:   Height as of 10/14/22: '6\' 2"'$  (1.88 m).   Weight as of 10/14/22: 320 lb (145.2 kg). Ideal: Patient weight not recorded  Assessment   Diagnosis Status  No diagnosis found. Controlled Controlled Controlled   Updated Problems: No problems updated.  Plan of Care  Problem-specific:  No problem-specific Assessment & Plan notes found for this encounter.  [name] has a current medication list which includes the following long-term medication(s): lisinopril, magnesium oxide -mg supplement, metformin, oxycodone, oxycodone, oxycodone, and sildenafil.  Pharmacotherapy (Medications Ordered): No orders of the defined types were placed in this encounter.  Orders:  No orders of the defined types were placed in this encounter.  Follow-up plan:   No follow-ups on file.     Interventional Therapies  Risk  Complexity Considerations:   Estimated body mass index is 38.52 kg/m as calculated from the following:   Height as of this encounter: '6\' 2"'$  (1.88 m).   Weight as of this encounter: 300 lb (136.1 kg). WNL   Planned  Pending:      Under consideration:   None at this time.   Completed:   None since joining practice in 02/06/2019   Therapeutic  Palliative (PRN)  options:   None established   Pharmacotherapy  Nonopioids transfer 10/28/2020: Magnesium (OTC)      Recent Visits Date Type Provider Dept  10/14/22 Office Visit Milinda Pointer, MD [facility]  Showing recent visits within past 90 days and meeting all other requirements Future Appointments Date Type Provider Dept  01/13/23 Appointment Milinda Pointer, MD [facility]  Showing future appointments within next 90 days and meeting all other requirements  I discussed the assessment and treatment plan with the patient. The patient was  provided an opportunity to ask questions and all were answered. The patient agreed with the plan and demonstrated an understanding of the instructions.  Patient advised to call back or seek an in-person evaluation if the symptoms or condition worsens.  Duration of encounter: *** minutes.  Total time on encounter, as per AMA guidelines included both the face-to-face and non-face-to-face time personally spent by the physician and/or other qualified health care professional(s) on the day of the encounter (includes time in activities that require the physician or other qualified health care professional and does not include time in activities normally performed by clinical staff). Physician's time may include the following activities when performed: Preparing to see the patient (e.g., pre-charting review of records, searching for previously ordered imaging, lab work, and nerve conduction tests) Review of prior analgesic pharmacotherapies. Reviewing PMP Interpreting ordered tests (e.g., lab work, imaging, nerve conduction tests) Performing post-procedure evaluations, including interpretation of diagnostic procedures Obtaining and/or reviewing separately obtained history Performing a medically appropriate examination and/or evaluation Counseling and educating the patient/family/caregiver Ordering medications, tests, or procedures Referring  and communicating with other health care professionals (when not separately reported) Documenting clinical information in the electronic or other health record Independently interpreting results (not separately reported) and communicating results to the patient/ family/caregiver Care coordination (not separately reported)  Note by: Gaspar Cola, MD Date: 01/13/2023; Time: 10:41 AM

## 2023-01-13 ENCOUNTER — Ambulatory Visit: Payer: Medicare Other | Attending: Pain Medicine | Admitting: Pain Medicine

## 2023-01-13 ENCOUNTER — Encounter: Payer: Self-pay | Admitting: Pain Medicine

## 2023-01-13 VITALS — BP 142/99 | HR 86 | Temp 98.4°F | Resp 16 | Ht 74.0 in | Wt 320.0 lb

## 2023-01-13 DIAGNOSIS — G894 Chronic pain syndrome: Secondary | ICD-10-CM | POA: Insufficient documentation

## 2023-01-13 DIAGNOSIS — M25551 Pain in right hip: Secondary | ICD-10-CM | POA: Diagnosis not present

## 2023-01-13 DIAGNOSIS — M25512 Pain in left shoulder: Secondary | ICD-10-CM | POA: Diagnosis present

## 2023-01-13 DIAGNOSIS — G8929 Other chronic pain: Secondary | ICD-10-CM | POA: Diagnosis present

## 2023-01-13 DIAGNOSIS — Z79891 Long term (current) use of opiate analgesic: Secondary | ICD-10-CM | POA: Diagnosis present

## 2023-01-13 DIAGNOSIS — M549 Dorsalgia, unspecified: Secondary | ICD-10-CM | POA: Insufficient documentation

## 2023-01-13 DIAGNOSIS — M533 Sacrococcygeal disorders, not elsewhere classified: Secondary | ICD-10-CM | POA: Diagnosis present

## 2023-01-13 DIAGNOSIS — Z79899 Other long term (current) drug therapy: Secondary | ICD-10-CM | POA: Diagnosis present

## 2023-01-13 DIAGNOSIS — M25561 Pain in right knee: Secondary | ICD-10-CM | POA: Insufficient documentation

## 2023-01-13 DIAGNOSIS — M545 Low back pain, unspecified: Secondary | ICD-10-CM

## 2023-01-13 DIAGNOSIS — M25511 Pain in right shoulder: Secondary | ICD-10-CM | POA: Insufficient documentation

## 2023-01-13 MED ORDER — OXYCODONE HCL 5 MG PO TABS: 5.0000 mg | ORAL_TABLET | Freq: Four times a day (QID) | ORAL | 0 refills | Status: DC | PRN

## 2023-01-13 NOTE — Progress Notes (Signed)
142/9Nursing Pain Medication Assessment:  Safety precautions to be maintained throughout the outpatient stay will include: orient to surroundings, keep bed in low position, maintain call bell within reach at all times, provide assistance with transfer out of bed and ambulation.  Medication Inspection Compliance: Pill count conducted under aseptic conditions, in front of the patient. Neither the pills nor the bottle was removed from the patient's sight at any time. Once count was completed pills were immediately returned to the patient in their original bottle.  Medication: Oxycodone IR Pill/Patch Count:  59 of 120 pills remain Pill/Patch Appearance: Markings consistent with prescribed medication Bottle Appearance: Standard pharmacy container. Clearly labeled. Filled Date: 01 / 16 / 2024 Last Medication intake:  Today

## 2023-01-13 NOTE — Patient Instructions (Signed)
____________________________________________________________________________________________  Opioid Pain Medication Update  To: All patients taking opioid pain medications. (I.e.: hydrocodone, hydromorphone, oxycodone, oxymorphone, morphine, codeine, methadone, tapentadol, tramadol, buprenorphine, fentanyl, etc.)  Re: Updated review of side effects and adverse reactions of opioid analgesics, as well as new information about long term effects of this class of medications.  Direct risks of long-term opioid therapy are not limited to opioid addiction and overdose. Potential medical risks include serious fractures, breathing problems during sleep, hyperalgesia, immunosuppression, chronic constipation, bowel obstruction, myocardial infarction, and tooth decay secondary to xerostomia.  Unpredictable adverse effects that can occur even if you take your medication correctly: Cognitive impairment, respiratory depression, and death. Most people think that if they take their medication "correctly", and "as instructed", that they will be safe. Nothing could be farther from the truth. In reality, a significant amount of recorded deaths associated with the use of opioids has occurred in individuals that had taken the medication for a long time, and were taking their medication correctly. The following are examples of how this can happen: Patient taking his/her medication for a long time, as instructed, without any side effects, is given a certain antibiotic or another unrelated medication, which in turn triggers a "Drug-to-drug interaction" leading to disorientation, cognitive impairment, impaired reflexes, respiratory depression or an untoward event leading to serious bodily harm or injury, including death.  Patient taking his/her medication for a long time, as instructed, without any side effects, develops an acute impairment of liver and/or kidney function. This will lead to a rapid inability of the body to  breakdown and eliminate their pain medication, which will result in effects similar to an "overdose", but with the same medicine and dose that they had always taken. This again may lead to disorientation, cognitive impairment, impaired reflexes, respiratory depression or an untoward event leading to serious bodily harm or injury, including death.  A similar problem will occur with patients as they grow older and their liver and kidney function begins to decrease as part of the aging process.  Background information: Historically, the original case for using long-term opioid therapy to treat chronic noncancer pain was based on safety assumptions that subsequent experience has called into question. In 1996, the American Pain Society and the [place] [employer] issued a consensus statement supporting long-term opioid therapy. This statement acknowledged the dangers of opioid prescribing but concluded that the risk for addiction was low; respiratory depression induced by opioids was short-lived, occurred mainly in opioid-naive patients, and was antagonized by pain; tolerance was not a common problem; and efforts to control diversion should not constrain opioid prescribing. This has now proven to be wrong. Experience regarding the risks for opioid addiction, misuse, and overdose in community practice has failed to support these assumptions.  According to the Centers for Disease Control and Prevention, fatal overdoses involving opioid analgesics have increased sharply over the past decade. Currently, more than 96,700 people die from drug overdoses every year. Opioids are a factor in 7 out of every 10 overdose deaths. Deaths from drug overdose have surpassed motor vehicle accidents as the leading cause of death for individuals between the ages of 3 and 36.  Clinical data suggest that neuroendocrine dysfunction may be very common in both men and women, potentially causing hypogonadism, erectile  dysfunction, infertility, decreased libido, osteoporosis, and depression. Recent studies linked higher opioid dose to increased opioid-related mortality. Controlled observational studies reported that long-term opioid therapy may be associated with increased risk for cardiovascular events. Subsequent meta-analysis concluded  that the safety of long-term opioid therapy in elderly patients has not been proven.   Side Effects and adverse reactions: Common side effects: Drowsiness (sedation). Dizziness. Nausea and vomiting. Constipation. Physical dependence -- Dependence often manifests with withdrawal symptoms when opioids are discontinued or decreased. Tolerance -- As you take repeated doses of opioids, you require increased medication to experience the same effect of pain relief. Respiratory depression -- This can occur in healthy people, especially with higher doses. However, people with COPD, asthma or other lung conditions may be even more susceptible to fatal respiratory impairment.  Uncommon side effects: An increased sensitivity to feeling pain and extreme response to pain (hyperalgesia). Chronic use of opioids can lead to this. Delayed gastric emptying (the process by which the contents of your stomach are moved into your small intestine). Muscle rigidity. Immune system and hormonal dysfunction. Quick, involuntary muscle jerks (myoclonus). Arrhythmia. Itchy skin (pruritus). Dry mouth (xerostomia).  Long-term side effects: Chronic constipation. Sleep-disordered breathing (SDB). Increased risk of bone fractures. Hypothalamic-pituitary-adrenal dysregulation. Increased risk of overdose.  RISKS: Fractures and Falls:  Opioids increase the risk and incidence of falls. This is of particular importance in elderly patients.  Endocrine System:  Long-term administration is associated with endocrine abnormalities. Influences on both the hypothalamic-pituitary-adrenal axis?and the  hypothalamic-pituitary-gonadal axis have been demonstrated with consequent hypogonadism and adrenal insufficiency in both sexes. Hypogonadism and decreased levels of dehydroepiandrosterone sulfate have been reported in men and women. Endocrine effects can lead to: Amenorrhoea in women Reduced libido in both sexes Erectile dysfunction in men Infertility Depression and fatigue Patients (particularly women of childbearing age) should avoid opioids. There is insufficient evidence to recommend routine monitoring of asymptomatic patients taking opioids in the long-term for hormonal deficiencies.  Immune System: Human studies have demonstrated that opioids have an immunomodulating effect. These effects are mediated via opioid receptors both on immune effector cells and in the central nervous system. Opioids have been demonstrated to have adverse effects on antimicrobial response and anti-tumour surveillance. Buprenorphine has been demonstrated to have no impact on immune function.  Opioid Induced Hyperalgesia: Human studies have demonstrated that prolonged use of opioids can lead to a state of abnormal pain sensitivity, sometimes called opioid induced hyperalgesia (OIH). Opioid induced hyperalgesia is not usually seen in the absence of tolerance to opioid analgesia. Clinically, hyperalgesia may be diagnosed if the patient on long-term opioid therapy presents with increased pain. This might be qualitatively and anatomically distinct from pain related to disease progression or to breakthrough pain resulting from development of opioid tolerance. Pain associated with hyperalgesia tends to be more diffuse than the pre-existing pain and less defined in quality. Management of opioid induced hyperalgesia requires opioid dose reduction.  Cancer: Chronic opioid therapy has been associated with an increased risk of cancer among noncancer patients with chronic pain. This association was more evident in chronic  strong opioid users. Chronic opioid consumption causes significant pathological changes in the small intestine and colon. Epidemiological studies have found that there is a link between opium dependence and initiation of gastrointestinal cancers. Cancer is the second leading cause of death after cardiovascular disease. Chronic use of opioids can cause multiple conditions such as GERD, immunosuppression and renal damage as well as carcinogenic effects, which are associated with the incidence of cancers.   Mortality: Long-term opioid use has been associated with increased mortality among patients with chronic non-cancer pain (CNCP).  Prescription of long-acting opioids for chronic noncancer pain was associated with a significantly increased risk of all-cause mortality,  including deaths from causes other than overdose.  Reference: Von Korff M, Kolodny A, Deyo RA, Chou R. Long-term opioid therapy reconsidered. Ann Intern Med. 2011 Sep 6;155(5):325-8. doi: 10.7326/[id number]. PMID: 82956213; PMCID: [id number]. Morley Kos, Hayward RA, Dunn KM, [place] KP. Risk of adverse events in patients prescribed long-term opioids: A cohort study in the [place] Clinical Practice Research Datalink. Eur J Pain. 2019 May;23(5):908-922. doi: 10.1002/ejp.1357. Epub 2019 Jan 31. PMID: 29528413. Colameco S, Coren JS, Ciervo CA. Continuous opioid treatment for chronic noncancer pain: a time for moderation in prescribing. Postgrad Med. 2009 Jul;121(4):61-6. doi: 10.3810/pgm.2009.07.2032. PMID: 24401027. Heywood Bene RN, Chevak SD, Blazina I, Rosalio Loud, Bougatsos C, Deyo RA. The effectiveness and risks of long-term opioid therapy for chronic pain: a systematic review for a [employer] of Health Pathways to [employer]. Ann Intern Med. 2015 Feb 17;162(4):276-86. doi: 25.3664/Q03-4742. PMID: 59563875. Marjory Sneddon [facility], Makuc DM. NCHS Data Brief No. 22. [place]:  Centers for Disease Control and Prevention; 2009. Sep, Increase in Fatal Poisonings Involving Opioid Analgesics in the [place], 1999-2006. Song IA, Choi HR, Oh TK. Long-term opioid use and mortality in patients with chronic non-cancer pain: Ten-year follow-up study in [place] from 2010 through 2019. EClinicalMedicine. 2022 Jul 18;51:101558. doi: 10.1016/j.eclinm.2022.643329. PMID: 51884166; PMCID: AYT0160109. Huser, W., Schubert, T., Vogelmann, T. et al. All-cause mortality in patients with long-term opioid therapy compared with non-opioid analgesics for chronic non-cancer pain: a database study. [place] Med 18, 162 (2020). [email or web address].[email or web address].com/ Rashidian H, Roxy Cedar, Malekzadeh R, Haghdoost AA. An Ecological Study of the Association between Opiate Use and Incidence of Cancers. Addict Health. 2016 Fall;8(4):252-260. PMID: 32355732; PMCID: [id number].  Our Goal: Our goal is to control your pain with means other than the use of opioid pain medications.  Our Recommendation: Talk to your physician about coming off of these medications. We can assist you with the tapering down and stopping these medicines. Based on the new information, even if you cannot completely stop the medication, a decrease in the dose may be associated with a lesser risk. Ask for other means of controlling the pain. Decrease or eliminate those factors that significantly contribute to your pain such as smoking, obesity, and a diet heavily tilted towards "inflammatory" nutrients.  ____________________________________________________________________________________________     ____________________________________________________________________________________________  Carron Brazen Pain Medication Shortage  The [place] is experiencing worsening drug shortages. These have had a negative widespread effect on patient care and treatment. Not expected to improve any time soon. Predicted to last past  2029.   Drug shortage list (generic names) Oxycodone IR Oxycodone/APAP Oxymorphone IR Hydromorphone Hydrocodone/APAP Morphine  Where is the problem?  Manufacturing and supply level.  Will this shortage affect you?  Only if you take any of the above pain medications.  How? You may be unable to fill your prescription.  Your pharmacist may offer a "partial fill" of your prescription. (Warning: Do not accept partial fills.) Prescriptions partially filled cannot be transferred to another pharmacy. Read our Medication Rules and Regulation. Depending on how much medicine you are dependent on, you may experience withdrawals when unable to get the medication.  Recommendations: Consider ending your dependence on opioid pain medications. Ask your pain specialist to assist you with the process. Consider switching to a medication currently not in shortage, such as Buprenorphine. Talk to your pain specialist about this option. Consider decreasing your pain medication requirements by managing tolerance thru "Drug Holidays". This may help minimize withdrawals, should you run  out of medicine. Control your pain thru the use of non-pharmacological interventional therapies.   Your prescriber: Prescribers cannot be blamed for shortages. Medication manufacturing and supply issues cannot be fixed by the prescriber.   NOTE: The prescriber is not responsible for supplying the medication, or solving supply issues. Work with your pharmacist to solve it. The patient is responsible for the decision to take or continue taking the medication and for identifying and securing a legal supply source. By law, supplying the medication is the job and responsibility of the pharmacy. The prescriber is responsible for the evaluation, monitoring, and prescribing of these medications.   Prescribers will NOT: Re-issue prescriptions that have been partially filled. Re-issue prescriptions already sent to a pharmacy.  Re-send  prescriptions to a different pharmacy because yours did not have your medication. Ask pharmacist to order more medicine or transfer the prescription to another pharmacy. (Read below.)  New 2023 regulation: "August 14, 2022 Revised Regulation Allows DEA-Registered Pharmacies to Transfer Electronic Prescriptions at a Patient's Request [place] Patients now have the ability to request their electronic prescription be transferred to another pharmacy without having to go back to their practitioner to initiate the request. This revised regulation went into effect on Monday, August 10, 2022.     At a patient's request, a DEA-registered retail pharmacy can now transfer an electronic prescription for a controlled substance (schedules II-V) to another DEA-registered retail pharmacy. Prior to this change, patients would have to go through their practitioner to cancel their prescription and have it re-issued to a different pharmacy. The process was taxing and time consuming for both patients and practitioners.    The Drug Enforcement Administration [facility]) published its intent to revise the process for transferring electronic prescriptions on November 01, 2020.  The final rule was published in the federal register on July 09, 2022 and went into effect 30 days later.  Under the final rule, a prescription can only be transferred once between pharmacies, and only if allowed under existing state or other applicable law. The prescription must remain in its electronic form; may not be altered in any way; and the transfer must be communicated directly between two licensed pharmacists. It's important to note, any authorized refills transfer with the original prescription, which means the entire prescription will be filled at the same pharmacy".  Reference: [employer] [facility]  website announcement)  [email or web address] ([place])   [employer] / Vol. 88, No. 143 / Thursday, July 09, 2022 / Rules and Regulations DEPARTMENT OF JUSTICE  Drug Enforcement Administration  21 CFR Part 1306  [Docket No. [id number]]  RIN [id number] Transfer of Electronic Prescriptions for Schedules II-V Controlled Substances Between Pharmacies for Initial Filling  ____________________________________________________________________________________________     _______________________________________________________________________  Medication Rules  Purpose: To inform patients, and their family members, of our medication rules and regulations.  Applies to: All patients receiving prescriptions from our practice (written or electronic).  Pharmacy of record: This is the pharmacy where your electronic prescriptions will be sent. Make sure we have the correct one.  Electronic prescriptions: In compliance with the [place] (STOP) Act of 2017 (Session Lanny Cramp [phone]), effective December 14, 2018, all controlled substances must be electronically prescribed. Written prescriptions, faxing, or calling prescriptions to a pharmacy will no longer be done.  Prescription refills: These will be provided only during in-person appointments. No medications will be renewed without a "face-to-face" evaluation with your provider. Applies to  all prescriptions.  NOTE: The following applies primarily to controlled substances (Opioid* Pain Medications).   Type of encounter (visit): For patients receiving controlled substances, face-to-face visits are required. (Not an option and not up to the patient.)  Patient's responsibilities: Pain Pills: Bring all pain pills to every appointment (except for procedure appointments). Pill Bottles: Bring pills in original pharmacy bottle. Bring  bottle, even if empty. Always bring the bottle of the most recent fill.  Medication refills: You are responsible for knowing and keeping track of what medications you are taking and when is it that you will need a refill. The day before your appointment: write a list of all prescriptions that need to be refilled. The day of the appointment: give the list to the admitting nurse. Prescriptions will be written only during appointments. No prescriptions will be written on procedure days. If you forget a medication: it will not be "Called in", "Faxed", or "electronically sent". You will need to get another appointment to get these prescribed. No early refills. Do not call asking to have your prescription filled early. Partial  or short prescriptions: Occasionally your pharmacy may not have enough pills to fill your prescription.  NEVER ACCEPT a partial fill or a prescription that is short of the total amount of pills that you were prescribed.  With controlled substances the law allows 72 hours for the pharmacy to complete the prescription.  If the prescription is not completed within 72 hours, the pharmacist will require a new prescription to be written. This means that you will be short on your medicine and we WILL NOT send another prescription to complete your original prescription.  Instead, request the pharmacy to send a carrier to a nearby branch to get enough medication to provide you with your full prescription. Prescription Accuracy: You are responsible for carefully inspecting your prescriptions before leaving our office. Have the discharge nurse carefully go over each prescription with you, before taking them home. Make sure that your name is accurately spelled, that your address is correct. Check the name and dose of your medication to make sure it is accurate. Check the number of pills, and the written instructions to make sure they are clear and accurate. Make sure that you are given enough medication  to last until your next medication refill appointment. Taking Medication: Take medication as prescribed. When it comes to controlled substances, taking less pills or less frequently than prescribed is permitted and encouraged. Never take more pills than instructed. Never take the medication more frequently than prescribed.  Inform other Doctors: Always inform, all of your healthcare providers, of all the medications you take. Pain Medication from other Providers: You are not allowed to accept any additional pain medication from any other Doctor or Healthcare provider. There are two exceptions to this rule. (see below) In the event that you require additional pain medication, you are responsible for notifying us, as stated below. Cough Medicine: Often these contain an opioid, such as codeine or hydrocodone. Never accept or take cough medicine containing these opioids if you are already taking an opioid* medication. The combination may cause respiratory failure and death. Medication Agreement: You are responsible for carefully reading and following our Medication Agreement. This must be signed before receiving any prescriptions from our practice. Safely store a copy of your signed Agreement. Violations to the Agreement will result in no further prescriptions. (Additional copies of our Medication Agreement are available upon request.) Laws, Rules, & Regulations: All patients are expected to follow  all Federal and [employer], Rules, & Regulations. Ignorance of the Laws does not constitute a valid excuse.  Illegal drugs and Controlled Substances: The use of illegal substances (including, but not limited to marijuana and its derivatives) and/or the illegal use of any controlled substances is strictly prohibited. Violation of this rule may result in the immediate and permanent discontinuation of any and all prescriptions being written by our practice. The use of any illegal substances is  prohibited. Adopted CDC guidelines & recommendations: Target dosing levels will be at or below 60 MME/day. Use of benzodiazepines** is not recommended.  Exceptions: There are only two exceptions to the rule of not receiving pain medications from other Healthcare Providers. Exception #1 (Emergencies): In the event of an emergency (i.e.: accident requiring emergency care), you are allowed to receive additional pain medication. However, you are responsible for: As soon as you are able, call our office [phone], at any time of the day or night, and leave a message stating your name, the date and nature of the emergency, and the name and dose of the medication prescribed. In the event that your call is answered by a member of our staff, make sure to document and save the date, time, and the name of the person that took your information.  Exception #2 (Planned Surgery): In the event that you are scheduled by another doctor or dentist to have any type of surgery or procedure, you are allowed (for a period no longer than 30 days), to receive additional pain medication, for the acute post-op pain. However, in this case, you are responsible for picking up a copy of our "Post-op Pain Management for Surgeons" handout, and giving it to your surgeon or dentist. This document is available at our office, and does not require an appointment to obtain it. Simply go to our office during business hours (Monday-Thursday from 8:00 AM to 4:00 PM) (Friday 8:00 AM to 12:00 Noon) or if you have a scheduled appointment with [place], prior to your surgery, and ask for it by name. In addition, you are responsible for: calling our office [phone], at any time of the day or night, and leaving a message stating your name, name of your surgeon, type of surgery, and date of procedure or surgery. Failure to comply with your responsibilities may result in termination of therapy involving the controlled substances. Medication Agreement  Violation. Following the above rules, including your responsibilities will help you in avoiding a Medication Agreement Violation ("Breaking your Pain Medication Contract").  Consequences:  Not following the above rules may result in permanent discontinuation of medication prescription therapy.  *Opioid medications include: morphine, codeine, oxycodone, oxymorphone, hydrocodone, hydromorphone, meperidine, tramadol, tapentadol, buprenorphine, fentanyl, methadone. **Benzodiazepine medications include: diazepam (Valium), alprazolam (Xanax), clonazepam (Klonopine), lorazepam (Ativan), clorazepate (Tranxene), chlordiazepoxide (Librium), estazolam (Prosom), oxazepam (Serax), temazepam (Restoril), triazolam (Halcion) (Last updated: 10/06/2022) ______________________________________________________________________    ______________________________________________________________________  Medication Recommendations and Reminders  Applies to: All patients receiving prescriptions (written and/or electronic).  Medication Rules & Regulations: You are responsible for reading, knowing, and following our "Medication Rules" document. These exist for your safety and that of others. They are not flexible and neither are we. Dismissing or ignoring them is an act of "non-compliance" that may result in complete and irreversible termination of such medication therapy. For safety reasons, "non-compliance" will not be tolerated. As with the [place] fundamental legal principle of "ignorance of the law is no defense", we will accept no excuses for not having read and  knowing the content of documents provided to you by our practice.  Pharmacy of record:  Definition: This is the pharmacy where your electronic prescriptions will be sent.  We do not endorse any particular pharmacy. It is up to you and your insurance to decide what pharmacy to use.  We do not restrict you in your choice of pharmacy. However, once we write for  your prescriptions, we will NOT be re-sending more prescriptions to fix restricted supply problems created by your pharmacy, or your insurance.  The pharmacy listed in the electronic medical record should be the one where you want electronic prescriptions to be sent. If you choose to change pharmacy, simply notify our nursing staff. Changes will be made only during your regular appointments and not over the phone.  Recommendations: Keep all of your pain medications in a safe place, under lock and key, even if you live alone. We will NOT replace lost, stolen, or damaged medication. We do not accept "Police Reports" as proof of medications having been stolen. After you fill your prescription, take 1 week's worth of pills and put them away in a safe place. You should keep a separate, properly labeled bottle for this purpose. The remainder should be kept in the original bottle. Use this as your primary supply, until it runs out. Once it's gone, then you know that you have 1 week's worth of medicine, and it is time to come in for a prescription refill. If you do this correctly, it is unlikely that you will ever run out of medicine. To make sure that the above recommendation works, it is very important that you make sure your medication refill appointments are scheduled at least 1 week before you run out of medicine. To do this in an effective manner, make sure that you do not leave the office without scheduling your next medication management appointment. Always ask the nursing staff to show you in your prescription , when your medication will be running out. Then arrange for the receptionist to get you a return appointment, at least 7 days before you run out of medicine. Do not wait until you have 1 or 2 pills left, to come in. This is very poor planning and does not take into consideration that we may need to cancel appointments due to bad weather, sickness, or emergencies affecting our staff. DO NOT ACCEPT A  "Partial Fill": If for any reason your pharmacy does not have enough pills/tablets to completely fill or refill your prescription, do not allow for a "partial fill". The law allows the pharmacy to complete that prescription within 72 hours, without requiring a new prescription. If they do not fill the rest of your prescription within those 72 hours, you will need a separate prescription to fill the remaining amount, which we will NOT provide. If the reason for the partial fill is your insurance, you will need to talk to the pharmacist about payment alternatives for the remaining tablets, but again, DO NOT ACCEPT A PARTIAL FILL, unless you can trust your pharmacist to obtain the remainder of the pills within 72 hours.  Prescription refills and/or changes in medication(s):  Prescription refills, and/or changes in dose or medication, will be conducted only during scheduled medication management appointments. (Applies to both, written and electronic prescriptions.) No refills on procedure days. No medication will be changed or started on procedure days. No changes, adjustments, and/or refills will be conducted on a procedure day. Doing so will interfere with the diagnostic portion of  the procedure. No phone refills. No medications will be "called into the pharmacy". No Fax refills. No weekend refills. No Holliday refills. No after hours refills.  Remember:  Business hours are:  Monday to Thursday 8:00 AM to 4:00 PM Provider's Schedule: Milinda Pointer, MD - Appointments are:  Medication management: Monday and Wednesday 8:00 AM to 4:00 PM Procedure day: Tuesday and Thursday 7:30 AM to 4:00 PM Gillis Santa, MD - Appointments are:  Medication management: Tuesday and Thursday 8:00 AM to 4:00 PM Procedure day: Monday and Wednesday 7:30 AM to 4:00 PM (Last update: 10/06/2022) ______________________________________________________________________     ____________________________________________________________________________________________  Drug Holidays  What is a "Drug Holiday"? Drug Holiday: is the name given to the process of slowly tapering down and temporarily stopping the pain medication for the purpose of decreasing or eliminating tolerance to the drug.  Benefits Improved effectiveness Decreased required effective dose Improved pain control End dependence on high dose therapy Decrease cost of therapy Uncovering "opioid-induced hyperalgesia". (OIH)  What is "opioid hyperalgesia"? It is a paradoxical increase in pain caused by exposure to opioids. Stopping the opioid pain medication, contrary to the expected, it actually decreases or completely eliminates the pain. Ref.: "A comprehensive review of opioid-induced hyperalgesia". Brion Aliment, et.al. Pain Physician. 2011 Mar-Apr;14(2):145-61.  What is tolerance? Tolerance: the progressive loss of effectiveness of a pain medicine due to repetitive use. A common problem of opioid pain medications.  How long should a "Drug Holiday" last? Effectiveness depends on the patient staying off all opioid pain medicines for a minimum of 14 consecutive days. (2 weeks)  How about just taking less of the medicine? Does not work. Will not accomplish goal of eliminating the excess receptors.  How about switching to a different pain medicine? (AKA. "Opioid rotation") Does not work. Creates the illusion of effectiveness by taking advantage of inaccurate equivalent dose calculations between different opioids. -This "technique" was promoted by studies funded by [employer], such as [employer], creators of "OxyContin".  Can I stop the medicine "cold [place]"? Depends. You should always coordinate with your Pain Specialist to make the transition as smoothly as possible. Avoid stopping the medicine abruptly without consulting. We recommend a "slow taper".  What is a slow  taper? Taper: refers to the gradual decrease in dose.   How do I stop/taper the dose? Slowly. Decrease the daily amount of pills that you take by one (1) pill every seven (7) days. This is called a "slow downward taper". Example: if you normally take four (4) pills per day, drop it to three (3) pills per day for seven (7) days, then to two (2) pills per day for seven (7) days, then to one (1) per day for seven (7) days, and then stop the medicine. The 14 day "Drug Holiday" starts on the first day without medicine.   Will I experience withdrawals? Unlikely with a slow taper.  What triggers withdrawals? Withdrawals are triggered by the sudden/abrupt stop of high dose opioids. Withdrawals can be avoided by slowly decreasing the dose over a prolonged period of time.  What are withdrawals? Symptoms associated with sudden/abrupt reduction/stopping of high-dose, long-term use of pain medication. Withdrawal are seldom seen on low dose therapy, or patients rarely taking opioid medication.  Early Withdrawal Symptoms may include: Agitation Anxiety Muscle aches Increased tearing Insomnia Runny nose Sweating Yawning  Late symptoms may include: Abdominal cramping Diarrhea Dilated pupils Goose bumps Nausea Vomiting  (Last update: 11/22/2022) ____________________________________________________________________________________________    ____________________________________________________________________________________________  WARNING: CBD (cannabidiol) &  Delta (Delta-8 tetrahydrocannabinol) products.   Applicable to:  All individuals currently taking or considering taking CBD (cannabidiol) and, more important, all patients taking opioid analgesic controlled substances (pain medication). (Example: oxycodone; oxymorphone; hydrocodone; hydromorphone; morphine; methadone; tramadol; tapentadol; fentanyl; buprenorphine; butorphanol; dextromethorphan; meperidine; codeine; etc.)  Introduction:   Recently there has been a drive towards the use of "natural" products for the treatment of different conditions, including pain anxiety and sleep disorders. Marijuana and hemp are two varieties of the cannabis genus plants. Marijuana and its derivatives are illegal, while hemp and its derivatives are not. Cannabidiol (CBD) and tetrahydrocannabinol (THC), are two natural compounds found in plants of the Cannabis genus. They can both be extracted from hemp or marijuana. Both compounds interact with your body's endocannabinoid system in very different ways. CBD is associated with pain relief (analgesia) while THC is associated with the psychoactive effects ("the high") obtained from the use of marijuana products. There are two main types of THC: Delta-9, which comes from the marijuana plant and it is illegal, and Delta-8, which comes from the hemp plant, and it is legal. (Both, Delta-9-THC and Delta-8-THC are psychoactive and give you "the high".)   Legality:  Marijuana and its derivatives: illegal Hemp and its derivatives: Legal (State dependent) UPDATE: (01/30/2022) The Drug Enforcement Agency ([place]) issued a letter stating that "delta" cannabinoids, including Delta-8-THCO and Delta-9-THCO, synthetically derived from hemp do not qualify as hemp and will be viewed as Schedule I drugs. (Schedule I drugs, substances, or chemicals are defined as drugs with no currently accepted medical use and a high potential for abuse. Some examples of Schedule I drugs are: heroin, lysergic acid diethylamide (LSD), marijuana (cannabis), 3,4-methylenedioxymethamphetamine (ecstasy), methaqualone, and peyote.) (https://jennings.com/)  Legal status of CBD in [place]:  "Conditionally Legal"  Reference: "FDA Regulation of Cannabis and Cannabis-Derived Products, Including Cannabidiol (CBD)" - SeekArtists.com.pt  Warning:   CBD is not FDA approved and has not undergo the same manufacturing controls as prescription drugs.  This means that the purity and safety of available CBD may be questionable. Most of the time, despite manufacturer's claims, it is contaminated with THC (delta-9-tetrahydrocannabinol - the chemical in marijuana responsible for the "HIGH").  When this is the case, the [facility] contaminant will trigger a positive urine drug screen (UDS) test for Marijuana (carboxy-THC).   The FDA recently put out a warning about 5 things that everyone should be aware of regarding Delta-8 THC: Delta-8 THC products have not been evaluated or approved by the FDA for safe use and may be marketed in ways that put the public health at risk. The FDA has received adverse event reports involving delta-8 THC-containing products. Delta-8 THC has psychoactive and intoxicating effects. Delta-8 THC manufacturing often involve use of potentially harmful chemicals to create the concentrations of delta-8 THC claimed in the marketplace. The final delta-8 THC product may have potentially harmful by-products (contaminants) due to the chemicals used in the process. Manufacturing of delta-8 THC products may occur in uncontrolled or unsanitary settings, which may lead to the presence of unsafe contaminants or other potentially harmful substances. Delta-8 THC products should be kept out of the reach of children and pets.  NOTE: Because a positive UDS for any illicit substance is a violation of our medication agreement, your opioid analgesics (pain medicine) may be permanently discontinued.  MORE ABOUT CBD  General Information: CBD was discovered in [birth year] and it is a derivative of the cannabis sativa genus plants (Marijuana and Hemp). It is one of the 113 identified  substances found in Marijuana. It accounts for up to 40% of the plant's extract. As of 2018, preliminary clinical studies on CBD included research for the treatment of anxiety, movement  disorders, and pain. CBD is available and consumed in multiple forms, including inhalation of smoke or vapor, as an aerosol spray, and by mouth. It may be supplied as an oil containing CBD, capsules, dried cannabis, or as a liquid solution. CBD is thought not to be as psychoactive as THC (delta-9-tetrahydrocannabinol - the chemical in marijuana responsible for the "HIGH"). Studies suggest that CBD may interact with different biological target receptors in the body, including cannabinoid and other neurotransmitter receptors. As of 2018 the mechanism of action for its biological effects has not been determined.  Side-effects  Adverse reactions: Dry mouth, diarrhea, decreased appetite, fatigue, drowsiness, malaise, weakness, sleep disturbances, and others.  Drug interactions:  CBD may interact with medications such as blood-thinners. CBD causes drowsiness on its own and it will increase drowsiness caused by other medications, including antihistamines (such as Benadryl), benzodiazepines (Xanax, Ativan, Valium), antipsychotics, antidepressants, opioids, alcohol and supplements such as kava, melatonin and St. John's Wort.  Other drug interactions: Brivaracetam (Briviact); Caffeine; Carbamazepine (Tegretol); Citalopram (Celexa); Clobazam (Onfi); Eslicarbazepine (Aptiom); Everolimus (Zostress); Lithium; Methadone (Dolophine); Rufinamide (Banzel); Sedative medications (CNS depressants); Sirolimus (Rapamune); Stiripentol (Diacomit); Tacrolimus (Prograf); Tamoxifen ; Soltamox); Topiramate (Topamax); Valproate; Warfarin (Coumadin); Zonisamide. (Last update: 11/23/2022) ____________________________________________________________________________________________   ____________________________________________________________________________________________  Naloxone Nasal Spray  Why am I receiving this medication? [place] STOP ACT requires that all patients taking high dose opioids or at risk of opioids  respiratory depression, be prescribed an opioid reversal agent, such as Naloxone (AKA: Narcan).  What is this medication? NALOXONE (nal OX one) treats opioid overdose, which causes slow or shallow breathing, severe drowsiness, or trouble staying awake. Call emergency services after using this medication. You may need additional treatment. Naloxone works by reversing the effects of opioids. It belongs to a group of medications called opioid blockers.  COMMON BRAND NAME(S): Kloxxado, Narcan  What should I tell my care team before I take this medication? They need to know if you have any of these conditions: Heart disease Substance use disorder An unusual or allergic reaction to naloxone, other medications, foods, dyes, or preservatives Pregnant or trying to get pregnant Breast-feeding  When to use this medication? This medication is to be used for the treatment of respiratory depression (less than 8 breaths per minute) secondary to opioid overdose.   How to use this medication? This medication is for use in the nose. Lay the person on their back. Support their neck with your hand and allow the head to tilt back before giving the medication. The nasal spray should be given into 1 nostril. After giving the medication, move the person onto their side. Do not remove or test the nasal spray until ready to use. Get emergency medical help right away after giving the first dose of this medication, even if the person wakes up. You should be familiar with how to recognize the signs and symptoms of a narcotic overdose. If more doses are needed, give the additional dose in the other nostril. Talk to your care team about the use of this medication in children. While this medication may be prescribed for children as young as newborns for selected conditions, precautions do apply.  Naloxone Overdosage: If you think you have taken too much of this medicine contact a poison control center or emergency room at  once.  NOTE: This medicine  is only for you. Do not share this medicine with others.  What if I miss a dose? This does not apply.  What may interact with this medication? This is only used during an emergency. No interactions are expected during emergency use. This list may not describe all possible interactions. Give your health care provider a list of all the medicines, herbs, non-prescription drugs, or dietary supplements you use. Also tell them if you smoke, drink alcohol, or use illegal drugs. Some items may interact with your medicine.  What should I watch for while using this medication? Keep this medication ready for use in the case of an opioid overdose. Make sure that you have the phone number of your care team and local hospital ready. You may need to have additional doses of this medication. Each nasal spray contains a single dose. Some emergencies may require additional doses. After use, bring the treated person to the nearest hospital or call 911. Make sure the treating care team knows that the person has received a dose of this medication. You will receive additional instructions on what to do during and after use of this medication before an emergency occurs.  What side effects may I notice from receiving this medication? Side effects that you should report to your care team as soon as possible: Allergic reactions--skin rash, itching, hives, swelling of the face, lips, tongue, or throat Side effects that usually do not require medical attention (report these to your care team if they continue or are bothersome): Constipation Dryness or irritation inside the nose Headache Increase in blood pressure Muscle spasms Stuffy nose Toothache This list may not describe all possible side effects. Call your doctor for medical advice about side effects. You may report side effects to FDA at 1-800-FDA-1088.  Where should I keep my medication? Because this is an emergency medication, you  should keep it with you at all times.  Keep out of the reach of children and pets. Store between 20 and 25 degrees C (68 and 77 degrees F). Do not freeze. Throw away any unused medication after the expiration date. Keep in original box until ready to use.  NOTE: This sheet is a summary. It may not cover all possible information. If you have questions about this medicine, talk to your doctor, pharmacist, or health care provider.   2023 Elsevier/Gold Standard (2021-08-08 00:00:00)  ____________________________________________________________________________________________   ____________________________________________________________________________________________  Patient Information update  To: All of our patients.  Re: Name change.  It has been made official that our current name, "[facility]"   will soon be changed to "[facility]".   The purpose of this change is to eliminate any confusion created by the concept of our practice being a "Medication Management Pain Clinic". In the past this has led to the misconception that we treat pain primarily by the use of prescription medications.  Nothing can be farther from the truth.   Understanding PAIN MANAGEMENT: To further understand what our practice does, you first have to understand that "Pain Management" is a subspecialty that requires additional training once a physician has completed their specialty training, which can be in either Anesthesia, Neurology, Psychiatry, or Physical Medicine and Rehabilitation (PMR). Each one of these contributes to the final approach taken by each physician to the management of their patient's pain. To be a "Pain Management Specialist" you must have first completed one of the specialty trainings below.  Anesthesiologists -  trained in clinical pharmacology and interventional techniques such as nerve  blockade and regional as well as central neuroanatomy. They are trained to block pain before, during, and after surgical interventions.  Neurologists - trained in the diagnosis and pharmacological treatment of complex neurological conditions, such as Multiple Sclerosis, Parkinson's, spinal cord injuries, and other systemic conditions that may be associated with symptoms that may include but are not limited to pain. They tend to rely primarily on the treatment of chronic pain using prescription medications.  Psychiatrist - trained in conditions affecting the psychosocial wellbeing of patients including but not limited to depression, anxiety, schizophrenia, personality disorders, addiction, and other substance use disorders that may be associated with chronic pain. They tend to rely primarily on the treatment of chronic pain using prescription medications.   Physical Medicine and Rehabilitation (PMR) physicians, also known as physiatrists - trained to treat a wide variety of medical conditions affecting the brain, spinal cord, nerves, bones, joints, ligaments, muscles, and tendons. Their training is primarily aimed at treating patients that have suffered injuries that have caused severe physical impairment. Their training is primarily aimed at the physical therapy and rehabilitation of those patients. They may also work alongside orthopedic surgeons or neurosurgeons using their expertise in assisting surgical patients to recover after their surgeries.  INTERVENTIONAL PAIN MANAGEMENT is sub-subspecialty of Pain Management.  Our physicians are Board-certified in Anesthesia, Pain Management, and Interventional Pain Management.  This meaning that not only have they been trained and Board-certified in their specialty of Anesthesia, and subspecialty of Pain Management, but they have also received further training in the sub-subspecialty of Interventional Pain Management, in order to become Board-certified as  INTERVENTIONAL PAIN MANAGEMENT SPECIALIST.    Mission: Our goal is to use our skills in  [place] as alternatives to the chronic use of prescription opioid medications for the treatment of pain. To make this more clear, we have changed our name to reflect what we do and offer. We will continue to offer medication management assessment and recommendations, but we will not be taking over any patient's medication management.  ____________________________________________________________________________________________

## 2023-01-27 ENCOUNTER — Other Ambulatory Visit: Payer: Self-pay | Admitting: Family Medicine

## 2023-01-27 DIAGNOSIS — R238 Other skin changes: Secondary | ICD-10-CM

## 2023-01-27 NOTE — Telephone Encounter (Signed)
Refill request Mycostatin 12/23/22    Last refill 12/23/22   30 G Last office visit 11/17/22 acute video

## 2023-03-04 ENCOUNTER — Telehealth: Payer: Self-pay | Admitting: Family Medicine

## 2023-03-04 NOTE — Telephone Encounter (Signed)
Contacted Larry Goodwin to schedule their annual wellness visit. Appointment made for 03/30/2023.  [place] Direct Dial: [phone]

## 2023-03-18 ENCOUNTER — Encounter: Payer: Self-pay | Admitting: Nurse Practitioner

## 2023-03-18 ENCOUNTER — Telehealth: Payer: Self-pay

## 2023-03-18 ENCOUNTER — Telehealth (INDEPENDENT_AMBULATORY_CARE_PROVIDER_SITE_OTHER): Payer: Medicare Other | Admitting: Nurse Practitioner

## 2023-03-18 VITALS — Temp 97.5°F | Ht 74.0 in | Wt 300.0 lb

## 2023-03-18 DIAGNOSIS — H938X3 Other specified disorders of ear, bilateral: Secondary | ICD-10-CM | POA: Diagnosis not present

## 2023-03-18 DIAGNOSIS — J019 Acute sinusitis, unspecified: Secondary | ICD-10-CM | POA: Diagnosis not present

## 2023-03-18 MED ORDER — FLUTICASONE PROPIONATE 50 MCG/ACT NA SUSP: 2.0000 | Freq: Every day | NASAL | 0 refills | Status: DC

## 2023-03-18 MED ORDER — AMOXICILLIN-POT CLAVULANATE 875-125 MG PO TABS: 1.0000 | ORAL_TABLET | Freq: Two times a day (BID) | ORAL | 0 refills | Status: DC

## 2023-03-18 NOTE — Patient Instructions (Signed)
Nice to see you today I have sent in some antibiotics and nasal spray Keep your appointment with Dr. Danise Mina as scheduled

## 2023-03-18 NOTE — Assessment & Plan Note (Signed)
History of the same and normal for several weeks.  Will treat patient with Augmentin 875-125 mg twice daily for 10 days.  States the 7-day course for resolution.  Follow-up in person if no improvement

## 2023-03-18 NOTE — Telephone Encounter (Signed)
Left message to return call to our office.  

## 2023-03-18 NOTE — Assessment & Plan Note (Signed)
Will do Flonase nasal spray 2 sprays each nostril daily.  Will start the patient on prednisone given last A1c was 12.2 defer to do that.  Patient did not follow-up as recommended.  He does have an appointment scheduled told him importance of keeping it.

## 2023-03-18 NOTE — Progress Notes (Signed)
Ph: [phone]       Fax: [phone]   Patient ID: Larry Goodwin, male    DOB: 1976/05/31, 47 y.o.   MRN: [id number]  Virtual visit completed through [employer], a video enabled telemedicine application. Due to national recommendations of social distancing due to COVID-19, a virtual visit is felt to be most appropriate for this patient at this time. Reviewed limitations, risks, security and privacy concerns of performing a virtual visit and the availability of in person appointments. I also reviewed that there may be a patient responsible charge related to this service. The patient agreed to proceed.   Patient location: home Provider location: [place] [facility], office Persons participating in this virtual visit: patient, provider   If any vitals were documented, they were collected by patient at home unless specified below.    Temp (!) 97.5 F (36.4 C)   Ht 6\' 2"  (1.88 m)   Wt 300 lb (136.1 kg)   BMI 38.52 kg/m    CC: Sinus issue Subjective:   HPI: Larry Goodwin is a 47 y.o. male presenting on 03/18/2023 for Pressure Behind the Eyes (In the head going on for a couple of weeks.) and Headache    With a history of HTN, DM2, Bells palsy,  Last A1C 12.2 approx 11 month ago  Symptoms started approx 3 weeks ago States that he started with a little pressure.  States that he started drinking a lot of fluids. No sick contacts States that he does have twin little girls  Covid: not up to date Flu: not up to date No coivd test  States that he has taken ibuprofen a few times.    Relevant past medical, surgical, family and social history reviewed and updated as indicated. Interim medical history since our last visit reviewed. Allergies and medications reviewed and updated. Outpatient Medications Prior to Visit  Medication Sig Dispense Refill   Cholecalciferol (VITAMIN D3) 125 MCG (5000 UT) CAPS Take 1 capsule (5,000 Units total) by mouth in the morning and at  bedtime.     lisinopril (ZESTRIL) 40 MG tablet Take 1 tablet (40 mg total) by mouth daily. 90 tablet 3   nystatin cream (MYCOSTATIN) APPLY AS DIRECTED 2 TIMES DAILY. USE FOR 1 WEEK. 30 g 0   oxyCODONE (OXY IR/ROXICODONE) 5 MG immediate release tablet Take 1 tablet (5 mg total) by mouth every 6 (six) hours as needed for severe pain. Must last 30 days 120 tablet 0   [START ON 03/29/2023] oxyCODONE (OXY IR/ROXICODONE) 5 MG immediate release tablet Take 1 tablet (5 mg total) by mouth every 6 (six) hours as needed for severe pain. Must last 30 days 120 tablet 0   sildenafil (VIAGRA) 100 MG tablet Take 0.5-1 tablets (50-100 mg total) by mouth daily as needed for erectile dysfunction. 5 tablet 3   vitamin B-12 (CYANOCOBALAMIN) 1000 MCG tablet Take 1,000 mcg by mouth daily.     Magnesium Oxide 500 MG CAPS Take 1 capsule (500 mg total) by mouth daily. 30 capsule 2   oxyCODONE (OXY IR/ROXICODONE) 5 MG immediate release tablet Take 1 tablet (5 mg total) by mouth every 6 (six) hours as needed for severe pain. Must last 30 days 120 tablet 0   amoxicillin-clavulanate (AUGMENTIN) 875-125 MG tablet Take 1 tablet by mouth 2 (two) times daily. 14 tablet 0   No facility-administered medications prior to visit.     Per HPI unless specifically indicated in ROS section below Review of Systems  Constitutional:  Positive for fatigue. Negative for chills and fever (subjective a couple day ago).       Appetite normal Fluid of fluid   HENT:  Positive for ear pain, sinus pressure and sinus pain. Negative for ear discharge.   Respiratory:  Negative for cough and shortness of breath.   Gastrointestinal:  Negative for abdominal pain, diarrhea and nausea.  Musculoskeletal:  Negative for myalgias (muscles feel tight).  Neurological:  Positive for headaches.   Objective:  Temp (!) 97.5 F (36.4 C)   Ht 6\' 2"  (1.88 m)   Wt 300 lb (136.1 kg)   BMI 38.52 kg/m   Wt Readings from Last 3 Encounters:  03/18/23 300 lb (136.1  kg)  01/13/23 (!) 320 lb (145.2 kg)  10/14/22 (!) 320 lb (145.2 kg)       Physical exam: Gen: alert, NAD, not ill appearing Pulm: speaks in complete sentences without increased work of breathing Psych: normal mood, normal thought content      Results for orders placed or performed in visit on 04/22/22  ToxASSURE Select 13 (MW), Urine  Result Value Ref Range   Summary Note    Assessment & Plan:   Acute sinusitis, recurrence not specified, unspecified location Assessment & Plan: History of the same and normal for several weeks.  Will treat patient with Augmentin 875-125 mg twice daily for 10 days.  States the 7-day course for resolution.  Follow-up in person if no improvement  Orders: -     Amoxicillin-Pot Clavulanate; Take 1 tablet by mouth 2 (two) times daily.  Dispense: 20 tablet; Refill: 0  Sensation of fullness in both ears Assessment & Plan: Will do Flonase nasal spray 2 sprays each nostril daily.  Will start the patient on prednisone given last A1c was 12.2 defer to do that.  Patient did not follow-up as recommended.  He does have an appointment scheduled told him importance of keeping it.  Orders: -     Fluticasone Propionate; Place 2 sprays into both nostrils daily.  Dispense: 16 g; Refill: 0     I discussed the assessment and treatment plan with the patient. The patient was provided an opportunity to ask questions and all were answered. The patient agreed with the plan and demonstrated an understanding of the instructions. The patient was advised to call back or seek an in-person evaluation if the symptoms worsen or if the condition fails to improve as anticipated.  Follow up plan: Return if symptoms worsen or fail to improve, for As scheduled for CPE.  [name], NP

## 2023-03-18 NOTE — Telephone Encounter (Signed)
Left message to return call to our office.  Provider wants to see in person instead of virtual.

## 2023-03-30 ENCOUNTER — Ambulatory Visit (INDEPENDENT_AMBULATORY_CARE_PROVIDER_SITE_OTHER): Payer: Medicare Other

## 2023-03-30 VITALS — Ht 74.0 in | Wt 300.0 lb

## 2023-03-30 DIAGNOSIS — Z0000 Encounter for general adult medical examination without abnormal findings: Secondary | ICD-10-CM | POA: Diagnosis not present

## 2023-03-30 NOTE — Patient Instructions (Addendum)
Larry Goodwin , Thank you for taking time to come for your Medicare Wellness Visit. I appreciate your ongoing commitment to your health goals. Please review the following plan we discussed and let me know if I can assist you in the future.   These are the goals we discussed:  Goals      Patient Stated     Starting 07/12/2018, I will continue to take medications as prescribed.      Patient Stated     12/27/2020, I will maintain and continue medications as prescribed.      Patient Stated     Would like to walk more     Patient Stated     Eat healthy and lose 50lbs.     Weight (lb) < 200 lb (90.7 kg)     07/17/2019, wants to live healthier and continue to lose weight        This is a list of the screening recommended for you and due dates:  Health Maintenance  Topic Date Due   Eye exam for diabetics  Never done   Hepatitis C Screening: USPSTF Recommendation to screen - Ages 45-79 yo.  Never done   DTaP/Tdap/Td vaccine (1 - Tdap) Never done   Complete foot exam   07/13/2019   Colon Cancer Screening  Never done   Hemoglobin A1C  10/23/2022   Yearly kidney function blood test for diabetes  04/23/2023   Yearly kidney health urinalysis for diabetes  04/23/2023   HIV Screening  07/12/2049*   Flu Shot  07/15/2023   Medicare Annual Wellness Visit  03/29/2024   HPV Vaccine  Aged Out   COVID-19 Vaccine  Discontinued  *Topic was postponed. The date shown is not the original due date.   Opioid Pain Medicine Management Opioids are powerful medicines that are used to treat moderate to severe pain. When used for short periods of time, they can help you to: Sleep better. Do better in physical or occupational therapy. Feel better in the first few days after an injury. Recover from surgery. Opioids should be taken with the supervision of a trained health care provider. They should be taken for the shortest period of time possible. This is because opioids can be addictive, and the longer you take  opioids, the greater your risk of addiction. This addiction can also be called opioid use disorder. What are the risks? Using opioid pain medicines for longer than 3 days increases your risk of side effects. Side effects include: Constipation. Nausea and vomiting. Breathing difficulties (respiratory depression). Drowsiness. Confusion. Opioid use disorder. Itching. Taking opioid pain medicine for a long period of time can affect your ability to do daily tasks. It also puts you at risk for: Motor vehicle crashes. Depression. Suicide. Heart attack. Overdose, which can be life-threatening. What is a pain treatment plan? A pain treatment plan is an agreement between you and your health care provider. Pain is unique to each person, and treatments vary depending on your condition. To manage your pain, you and your health care provider need to work together. To help you do this: Discuss the goals of your treatment, including how much pain you might expect to have and how you will manage the pain. Review the risks and benefits of taking opioid medicines. Remember that a good treatment plan uses more than one approach and minimizes the chance of side effects. Be honest about the amount of medicines you take and about any drug or alcohol use. Get pain medicine prescriptions  from only one health care provider. Pain can be managed with many types of alternative treatments. Ask your health care provider to refer you to one or more specialists who can help you manage pain through: Physical or occupational therapy. Counseling (cognitive behavioral therapy). Good nutrition. Biofeedback. Massage. Meditation. Non-opioid medicine. Following a gentle exercise program. How to use opioid pain medicine Taking medicine Take your pain medicine exactly as told by your health care provider. Take it only when you need it. If your pain gets less severe, you may take less than your prescribed dose if your health  care provider approves. If you are not having pain, do nottake pain medicine unless your health care provider tells you to take it. If your pain is severe, do nottry to treat it yourself by taking more pills than instructed on your prescription. Contact your health care provider for help. Write down the times when you take your pain medicine. It is easy to become confused while on pain medicine. Writing the time can help you avoid overdose. Take other over-the-counter or prescription medicines only as told by your health care provider. Keeping yourself and others safe  While you are taking opioid pain medicine: Do not drive, use machinery, or power tools. Do not sign legal documents. Do not drink alcohol. Do not take sleeping pills. Do not supervise children by yourself. Do not do activities that require climbing or being in high places. Do not go to a lake, river, ocean, spa, or swimming pool. Do not share your pain medicine with anyone. Keep pain medicine in a locked cabinet or in a secure area where pets and children cannot reach it. Stopping your use of opioids If you have been taking opioid medicine for more than a few weeks, you may need to slowly decrease (taper) how much you take until you stop completely. Tapering your use of opioids can decrease your risk of symptoms of withdrawal, such as: Pain and cramping in the abdomen. Nausea. Sweating. Sleepiness. Restlessness. Uncontrollable shaking (tremors). Cravings for the medicine. Do not attempt to taper your use of opioids on your own. Talk with your health care provider about how to do this. Your health care provider may prescribe a step-down schedule based on how much medicine you are taking and how long you have been taking it. Getting rid of leftover pills Do not save any leftover pills. Get rid of leftover pills safely by: Taking the medicine to a prescription take-back program. This is usually offered by the county or law  enforcement. Bringing them to a pharmacy that has a drug disposal container. Flushing them down the toilet. Check the label or package insert of your medicine to see whether this is safe to do. Throwing them out in the trash. Check the label or package insert of your medicine to see whether this is safe to do. If it is safe to throw it out, remove the medicine from the original container, put it into a sealable bag or container, and mix it with used coffee grounds, food scraps, dirt, or cat litter before putting it in the trash. Follow these instructions at home: Activity Do exercises as told by your health care provider. Avoid activities that make your pain worse. Return to your normal activities as told by your health care provider. Ask your health care provider what activities are safe for you. General instructions You may need to take these actions to prevent or treat constipation: Drink enough fluid to keep your urine pale  yellow. Take over-the-counter or prescription medicines. Eat foods that are high in fiber, such as beans, whole grains, and fresh fruits and vegetables. Limit foods that are high in fat and processed sugars, such as fried or sweet foods. Keep all follow-up visits. This is important. Where to find support If you have been taking opioids for a long time, you may benefit from receiving support for quitting from a local support group or counselor. Ask your health care provider for a referral to these resources in your area. Where to find more information Centers for Disease Control and Prevention (CDC): FootballExhibition.com.br [place] Food and Drug Administration (FDA): PumpkinSearch.com.ee Get help right away if: You may have taken too much of an opioid (overdosed). Common symptoms of an overdose: Your breathing is slower or more shallow than normal. You have a very slow heartbeat (pulse). You have slurred speech. You have nausea and vomiting. Your pupils become very small. You have other  potential symptoms: You are very confused. You faint or feel like you will faint. You have cold, clammy skin. You have blue lips or fingernails. You have thoughts of harming yourself or harming others. These symptoms may represent a serious problem that is an emergency. Do not wait to see if the symptoms will go away. Get medical help right away. Call your local emergency services (911 in the [place] Do not drive yourself to the hospital.  If you ever feel like you may hurt yourself or others, or have thoughts about taking your own life, get help right away. Go to your nearest emergency department or: Call your local emergency services (911 in the [place] Call the [facility] ((612) 372-0627 in the [place] Call a suicide crisis helpline, such as the National Suicide Prevention Lifeline at 971-313-7766 or 988 in the [place] This is open 24 hours a day in the [place] Text the Crisis Text Line at 5070540730 (in the [place] Summary Opioid medicines can help you manage moderate to severe pain for a short period of time. A pain treatment plan is an agreement between you and your health care provider. Discuss the goals of your treatment, including how much pain you might expect to have and how you will manage the pain. If you think that you or someone else may have taken too much of an opioid, get medical help right away. This information is not intended to replace advice given to you by your health care provider. Make sure you discuss any questions you have with your health care provider. Document Revised: 06/25/2021 Document Reviewed: 03/12/2021 Elsevier Patient Education  2023 Elsevier Inc.   Advanced directives: none  Conditions/risks identified: Aim for 30 minutes of exercise or brisk walking, 6-8 glasses of water, and 5 servings of fruits and vegetables each day.   Next appointment: Follow up in one year for your annual wellness visit 03/30/24 @ 2:45 televisit.  Preventive Care  40-64 Years, Male Preventive care refers to lifestyle choices and visits with your health care provider that can promote health and wellness. What does preventive care include? A yearly physical exam. This is also called an annual well check. Dental exams once or twice a year. Routine eye exams. Ask your health care provider how often you should have your eyes checked. Personal lifestyle choices, including: Daily care of your teeth and gums. Regular physical activity. Eating a healthy diet. Avoiding tobacco and drug use. Limiting alcohol use. Practicing safe sex. Taking low-dose aspirin every day starting at age 69. What happens during  an annual well check? The services and screenings done by your health care provider during your annual well check will depend on your age, overall health, lifestyle risk factors, and family history of disease. Counseling  Your health care provider may ask you questions about your: Alcohol use. Tobacco use. Drug use. Emotional well-being. Home and relationship well-being. Sexual activity. Eating habits. Work and work Astronomer. Screening  You may have the following tests or measurements: Height, weight, and BMI. Blood pressure. Lipid and cholesterol levels. These may be checked every 5 years, or more frequently if you are over 22 years old. Skin check. Lung cancer screening. You may have this screening every year starting at age 37 if you have a 30-pack-year history of smoking and currently smoke or have quit within the past 15 years. Fecal occult blood test (FOBT) of the stool. You may have this test every year starting at age 5. Flexible sigmoidoscopy or colonoscopy. You may have a sigmoidoscopy every 5 years or a colonoscopy every 10 years starting at age 3. Prostate cancer screening. Recommendations will vary depending on your family history and other risks. Hepatitis C blood test. Hepatitis B blood test. Sexually transmitted disease (STD)  testing. Diabetes screening. This is done by checking your blood sugar (glucose) after you have not eaten for a while (fasting). You may have this done every 1-3 years. Discuss your test results, treatment options, and if necessary, the need for more tests with your health care provider. Vaccines  Your health care provider may recommend certain vaccines, such as: Influenza vaccine. This is recommended every year. Tetanus, diphtheria, and acellular pertussis (Tdap, Td) vaccine. You may need a Td booster every 10 years. Zoster vaccine. You may need this after age 83. Pneumococcal 13-valent conjugate (PCV13) vaccine. You may need this if you have certain conditions and have not been vaccinated. Pneumococcal polysaccharide (PPSV23) vaccine. You may need one or two doses if you smoke cigarettes or if you have certain conditions. Talk to your health care provider about which screenings and vaccines you need and how often you need them. This information is not intended to replace advice given to you by your health care provider. Make sure you discuss any questions you have with your health care provider. Document Released: 12/27/2015 Document Revised: 08/19/2016 Document Reviewed: 10/01/2015 Elsevier Interactive Patient Education  2017 [employer].  Fall Prevention in the Home Falls can cause injuries. They can happen to people of all ages. There are many things you can do to make your home safe and to help prevent falls. What can I do on the outside of my home? Regularly fix the edges of walkways and driveways and fix any cracks. Remove anything that might make you trip as you walk through a door, such as a raised step or threshold. Trim any bushes or trees on the path to your home. Use bright outdoor lighting. Clear any walking paths of anything that might make someone trip, such as rocks or tools. Regularly check to see if handrails are loose or broken. Make sure that both sides of any steps  have handrails. Any raised decks and porches should have guardrails on the edges. Have any leaves, snow, or ice cleared regularly. Use sand or salt on walking paths during winter. Clean up any spills in your garage right away. This includes oil or grease spills. What can I do in the bathroom? Use night lights. Install grab bars by the toilet and in the tub and shower. Do not  use towel bars as grab bars. Use non-skid mats or decals in the tub or shower. If you need to sit down in the shower, use a plastic, non-slip stool. Keep the floor dry. Clean up any water that spills on the floor as soon as it happens. Remove soap buildup in the tub or shower regularly. Attach bath mats securely with double-sided non-slip rug tape. Do not have throw rugs and other things on the floor that can make you trip. What can I do in the bedroom? Use night lights. Make sure that you have a light by your bed that is easy to reach. Do not use any sheets or blankets that are too big for your bed. They should not hang down onto the floor. Have a firm chair that has side arms. You can use this for support while you get dressed. Do not have throw rugs and other things on the floor that can make you trip. What can I do in the kitchen? Clean up any spills right away. Avoid walking on wet floors. Keep items that you use a lot in easy-to-reach places. If you need to reach something above you, use a strong step stool that has a grab bar. Keep electrical cords out of the way. Do not use floor polish or wax that makes floors slippery. If you must use wax, use non-skid floor wax. Do not have throw rugs and other things on the floor that can make you trip. What can I do with my stairs? Do not leave any items on the stairs. Make sure that there are handrails on both sides of the stairs and use them. Fix handrails that are broken or loose. Make sure that handrails are as long as the stairways. Check any carpeting to make  sure that it is firmly attached to the stairs. Fix any carpet that is loose or worn. Avoid having throw rugs at the top or bottom of the stairs. If you do have throw rugs, attach them to the floor with carpet tape. Make sure that you have a light switch at the top of the stairs and the bottom of the stairs. If you do not have them, ask someone to add them for you. What else can I do to help prevent falls? Wear shoes that: Do not have high heels. Have rubber bottoms. Are comfortable and fit you well. Are closed at the toe. Do not wear sandals. If you use a stepladder: Make sure that it is fully opened. Do not climb a closed stepladder. Make sure that both sides of the stepladder are locked into place. Ask someone to hold it for you, if possible. Clearly mark and make sure that you can see: Any grab bars or handrails. First and last steps. Where the edge of each step is. Use tools that help you move around (mobility aids) if they are needed. These include: Canes. Walkers. Scooters. Crutches. Turn on the lights when you go into a dark area. Replace any light bulbs as soon as they burn out. Set up your furniture so you have a clear path. Avoid moving your furniture around. If any of your floors are uneven, fix them. If there are any pets around you, be aware of where they are. Review your medicines with your doctor. Some medicines can make you feel dizzy. This can increase your chance of falling. Ask your doctor what other things that you can do to help prevent falls. This information is not intended to replace advice given  to you by your health care provider. Make sure you discuss any questions you have with your health care provider. Document Released: 09/26/2009 Document Revised: 05/07/2016 Document Reviewed: 01/04/2015 Elsevier Interactive Patient Education  2017 [employer].

## 2023-03-30 NOTE — Progress Notes (Signed)
I connected with  Larry Goodwin on 03/30/23 by a audio enabled telemedicine application and verified that I am speaking with the correct person using two identifiers.  Patient Location: Home  Provider Location: Home Office  I discussed the limitations of evaluation and management by telemedicine. The patient expressed understanding and agreed to proceed.  Subjective:   Larry Goodwin is a 47 y.o. male who presents for Medicare Annual/Subsequent preventive examination.  Review of Systems      Cardiac Risk Factors include: advanced age (>38men, >20 women);sedentary lifestyle;hypertension;male gender     Objective:    Today's Vitals   03/30/23 1431 03/30/23 1432  Weight: 300 lb (136.1 kg)   Height: 6\' 2"  (1.88 m)   PainSc:  3    Body mass index is 38.52 kg/m.     03/30/2023    2:43 PM 07/20/2022   10:26 AM 01/19/2022    1:38 PM 12/29/2021    1:18 PM 12/27/2020    1:26 PM 02/12/2020    5:57 PM 07/17/2019    2:04 PM  Advanced Directives  Does Patient Have a Medical Advance Directive? No No No No No No No  Would patient like information on creating a medical advance directive? No - Patient declined No - Patient declined  Yes (MAU/Ambulatory/Procedural Areas - Information given) No - Patient declined      Current Medications (verified) Outpatient Encounter Medications as of 03/30/2023  Medication Sig   Cholecalciferol (VITAMIN D3) 125 MCG (5000 UT) CAPS Take 1 capsule (5,000 Units total) by mouth in the morning and at bedtime.   fluticasone (FLONASE) 50 MCG/ACT nasal spray Place 2 sprays into both nostrils daily.   lisinopril (ZESTRIL) 40 MG tablet Take 1 tablet (40 mg total) by mouth daily.   Magnesium Oxide 500 MG CAPS Take 1 capsule (500 mg total) by mouth daily.   nystatin cream (MYCOSTATIN) APPLY AS DIRECTED 2 TIMES DAILY. USE FOR 1 WEEK.   oxyCODONE (OXY IR/ROXICODONE) 5 MG immediate release tablet Take 1 tablet (5 mg total) by mouth every 6 (six) hours as needed for  severe pain. Must last 30 days   sildenafil (VIAGRA) 100 MG tablet Take 0.5-1 tablets (50-100 mg total) by mouth daily as needed for erectile dysfunction.   vitamin B-12 (CYANOCOBALAMIN) 1000 MCG tablet Take 1,000 mcg by mouth daily.   amoxicillin-clavulanate (AUGMENTIN) 875-125 MG tablet Take 1 tablet by mouth 2 (two) times daily. (Patient not taking: Reported on 03/30/2023)   oxyCODONE (OXY IR/ROXICODONE) 5 MG immediate release tablet Take 1 tablet (5 mg total) by mouth every 6 (six) hours as needed for severe pain. Must last 30 days   oxyCODONE (OXY IR/ROXICODONE) 5 MG immediate release tablet Take 1 tablet (5 mg total) by mouth every 6 (six) hours as needed for severe pain. Must last 30 days   No facility-administered encounter medications on file as of 03/30/2023.    Allergies (verified) Levaquin [levofloxacin in d5w]   History: Past Medical History:  Diagnosis Date   Bell's palsy 2015   right then left sides   Chronic pain syndrome    s/p 2 back surgeries [facility])   DDD (degenerative disc disease), lumbar    severe s/p 2 surgeries   HTN (hypertension)    Morbid obesity 08/16/2014   Past Surgical History:  Procedure Laterality Date   BACK SURGERY  2012   x 2 [facility])   CHOLECYSTECTOMY     Family History  Problem Relation Age of Onset   COPD  Mother        smoker (deceased at 57)   Hypertension Other    Stroke Neg Hx    Cancer Neg Hx    Diabetes Neg Hx    CAD Neg Hx    Social History   Socioeconomic History   Marital status: Married    Spouse name: Not on file   Number of children: Not on file   Years of education: Not on file   Highest education level: Not on file  Occupational History   Not on file  Tobacco Use   Smoking status: Former    Types: Cigarettes    Quit date: 12/15/2007    Years since quitting: 15.2   Smokeless tobacco: Never   Tobacco comments:    quit 6 years ago  Vaping Use   Vaping Use: Never used  Substance and Sexual Activity   Alcohol use:  No    Alcohol/week: 0.0 standard drinks of alcohol   Drug use: Yes    Types: Oxycodone   Sexual activity: Yes  Other Topics Concern   Not on file  Social History Narrative   Lives with wife and 3 children, 2 dogs   Occupation: football Psychologist, occupational at [employer], on disability due to back   Edu: HS   Activity: coaches   Diet: good water, fruits/vegetables daily   Social Determinants of Health   Financial Resource Strain: Low Risk  (03/30/2023)   Overall Financial Resource Strain (CARDIA)    Difficulty of Paying Living Expenses: Not hard at all  Food Insecurity: No Food Insecurity (03/30/2023)   Hunger Vital Sign    Worried About Running Out of Food in the Last Year: Never true    Ran Out of Food in the Last Year: Never true  Transportation Needs: No Transportation Needs (03/30/2023)   PRAPARE - Administrator, Civil Service (Medical): No    Lack of Transportation (Non-Medical): No  Physical Activity: Insufficiently Active (03/30/2023)   Exercise Vital Sign    Days of Exercise per Week: 2 days    Minutes of Exercise per Session: 30 min  Stress: No Stress Concern Present (03/30/2023)   [employer] of Occupational Health - Occupational Stress Questionnaire    Feeling of Stress : Not at all  Social Connections: Moderately Integrated (03/30/2023)   Social Connection and Isolation Panel [NHANES]    Frequency of Communication with Friends and Family: More than three times a week    Frequency of Social Gatherings with Friends and Family: More than three times a week    Attends Religious Services: More than 4 times per year    Active Member of [employer] or Organizations: No    Attends Engineer, structural: Never    Marital Status: Married    Tobacco Counseling Counseling given: Not Answered Tobacco comments: quit 6 years ago   Clinical Intake:  Pre-visit preparation completed: Yes  Pain : 0-10 Pain Score: 3  Pain Type: Chronic pain Pain Location:  Back Pain Orientation: Lower, Mid Pain Descriptors / Indicators: Constant, Dull Pain Onset: Other (comment) (10 years) Pain Frequency: Constant     Nutritional Risks: None Diabetes: No  How often do you need to have someone help you when you read instructions, pamphlets, or other written materials from your doctor or pharmacy?: 1 - Never  Diabetic? no  Interpreter Needed?: No  Information entered by :: C.Chrystine Frogge LPN   Activities of Daily Living    03/30/2023    2:43  PM  In your present state of health, do you have any difficulty performing the following activities:  Hearing? 0  Vision? 0  Difficulty concentrating or making decisions? 0  Walking or climbing stairs? 1  Comment Back pain  Dressing or bathing? 0  Doing errands, shopping? 0  Preparing Food and eating ? N  Using the Toilet? N  In the past six months, have you accidently leaked urine? N  Do you have problems with loss of bowel control? N  Managing your Medications? N  Managing your Finances? N  Housekeeping or managing your Housekeeping? N    Patient Care Team: Eustaquio Boyden, MD as PCP - General (Family Medicine)  Indicate any recent Medical Services you may have received from other than [facility] providers in the past year (date may be approximate).     Assessment:   This is a routine wellness examination for Larry Goodwin.  Hearing/Vision screen Hearing Screening - Comments:: No aids Vision Screening - Comments:: Glasses - unknown provider  Dietary issues and exercise activities discussed: Current Exercise Habits: Home exercise routine, Type of exercise: walking, Time (Minutes): 30, Frequency (Times/Week): 2, Weekly Exercise (Minutes/Week): 60, Intensity: Mild, Exercise limited by: orthopedic condition(s) (Back pain)   Goals Addressed             This Visit's Progress    Patient Stated       Eat healthy and lose 50lbs.       Depression Screen    03/30/2023    2:42 PM 07/20/2022   10:26 AM  04/22/2022   11:56 AM 01/19/2022    1:37 PM 12/29/2021    1:20 PM 04/30/2021   10:44 AM 12/27/2020    1:27 PM  PHQ 2/9 Scores  PHQ - 2 Score 0 0 0 0 0 0 0  PHQ- 9 Score       0    Fall Risk    03/30/2023    2:38 PM 01/13/2023   11:37 AM 10/14/2022   10:36 AM 07/20/2022   10:26 AM 04/22/2022   11:56 AM  Fall Risk   Falls in the past year? 0 0 0 0 0  Number falls in past yr: 0 0 0    Injury with Fall? 0 0 0    Risk for fall due to : No Fall Risks      Follow up Falls prevention discussed;Falls evaluation completed        FALL RISK PREVENTION PERTAINING TO THE HOME:  Any stairs in or around the home? Yes  If so, are there any without handrails? No  Home free of loose throw rugs in walkways, pet beds, electrical cords, etc? Yes  Adequate lighting in your home to reduce risk of falls? Yes   ASSISTIVE DEVICES UTILIZED TO PREVENT FALLS:  Life alert? No  Use of a cane, walker or w/c? No  Grab bars in the bathroom? Yes  Shower chair or bench in shower? Yes  Elevated toilet seat or a handicapped toilet? Yes    Cognitive Function:    12/27/2020    1:29 PM 07/17/2019    2:07 PM 07/12/2018    2:56 PM  MMSE - Mini Mental State Exam  Orientation to time 5 5 5   Orientation to Place 5 5 5   Registration 3 3 3   Attention/ Calculation 5 5 0  Recall 3 3 3   Language- name 2 objects  0 0  Language- repeat 1 1 1   Language- follow 3 step command  0 3  Language- read & follow direction  0 0  Write a sentence  0 0  Copy design  0 0  Total score  22 20        03/30/2023    2:44 PM  6CIT Screen  What Year? 0 points  What month? 0 points  What time? 0 points  Count back from 20 0 points  Months in reverse 0 points  Repeat phrase 0 points  Total Score 0 points    Immunizations  There is no immunization history on file for this patient.  TDAP status: Due, Education has been provided regarding the importance of this vaccine. Advised may receive this vaccine at local pharmacy or Health  Dept. Aware to provide a copy of the vaccination record if obtained from local pharmacy or Health Dept. Verbalized acceptance and understanding.  Flu Vaccine status: Declined, Education has been provided regarding the importance of this vaccine but patient still declined. Advised may receive this vaccine at local pharmacy or Health Dept. Aware to provide a copy of the vaccination record if obtained from local pharmacy or Health Dept. Verbalized acceptance and understanding.  Pneumococcal vaccine status: Declined,  Education has been provided regarding the importance of this vaccine but patient still declined. Advised may receive this vaccine at local pharmacy or Health Dept. Aware to provide a copy of the vaccination record if obtained from local pharmacy or Health Dept. Verbalized acceptance and understanding.   Covid-19 vaccine status: Declined, Education has been provided regarding the importance of this vaccine but patient still declined. Advised may receive this vaccine at local pharmacy or Health Dept.or vaccine clinic. Aware to provide a copy of the vaccination record if obtained from local pharmacy or Health Dept. Verbalized acceptance and understanding.  Qualifies for Shingles Vaccine? Yes   Zostavax completed No   Shingrix Completed?: No.    Education has been provided regarding the importance of this vaccine. Patient has been advised to call insurance company to determine out of pocket expense if they have not yet received this vaccine. Advised may also receive vaccine at local pharmacy or Health Dept. Verbalized acceptance and understanding.  Screening Tests Health Maintenance  Topic Date Due   OPHTHALMOLOGY EXAM  Never done   Hepatitis C Screening  Never done   DTaP/Tdap/Td (1 - Tdap) Never done   FOOT EXAM  07/13/2019   COLONOSCOPY (Pts 45-56yrs Insurance coverage will need to be confirmed)  Never done   HEMOGLOBIN A1C  10/23/2022   Diabetic kidney evaluation - eGFR measurement   04/23/2023   Diabetic kidney evaluation - Urine ACR  04/23/2023   HIV Screening  07/12/2049 (Originally 09/07/1991)   INFLUENZA VACCINE  07/15/2023   Medicare Annual Wellness (AWV)  03/29/2024   HPV VACCINES  Aged Out   COVID-19 Vaccine  Discontinued    Health Maintenance  Health Maintenance Due  Topic Date Due   OPHTHALMOLOGY EXAM  Never done   Hepatitis C Screening  Never done   DTaP/Tdap/Td (1 - Tdap) Never done   FOOT EXAM  07/13/2019   COLONOSCOPY (Pts 45-82yrs Insurance coverage will need to be confirmed)  Never done   HEMOGLOBIN A1C  10/23/2022   Diabetic kidney evaluation - eGFR measurement  04/23/2023   Diabetic kidney evaluation - Urine ACR  04/23/2023    Colonoscopy - Declined at this time.  Lung Cancer Screening: (Low Dose CT Chest recommended if Age 1-80 years, 30 pack-year currently smoking OR have quit w/in 15years.) does not  qualify.   Lung Cancer Screening Referral: no  Additional Screening:  Hepatitis C Screening: does not qualify; Completed no  Vision Screening: Recommended annual ophthalmology exams for early detection of glaucoma and other disorders of the eye. Is the patient up to date with their annual eye exam?  Yes per pt Who is the provider or what is the name of the office in which the patient attends annual eye exams? Unknown provider If pt is not established with a provider, would they like to be referred to a provider to establish care? No .   Dental Screening: Recommended annual dental exams for proper oral hygiene  Community Resource Referral / Chronic Care Management: CRR required this visit?  No   CCM required this visit?  No      Plan:     I have personally reviewed and noted the following in the patient's chart:   Medical and social history Use of alcohol, tobacco or illicit drugs  Current medications and supplements including opioid prescriptions. Patient is not currently taking opioid prescriptions. Functional ability and  status Nutritional status Physical activity Advanced directives List of other physicians Hospitalizations, surgeries, and ER visits in previous 12 months Vitals Screenings to include cognitive, depression, and falls Referrals and appointments  In addition, I have reviewed and discussed with patient certain preventive protocols, quality metrics, and best practice recommendations. A written personalized care plan for preventive services as well as general preventive health recommendations were provided to patient.     Maryan Puls, LPN   1/61/0960   Nurse Notes: Pt declines colonoscopy. Vaccinations: declines all Influenza vaccine: recommend every Fall Pneumococcal vaccine: recommend once per lifetime Prevnar-20 Tdap vaccine: recommend every 10 years Shingles vaccine: recommend Shingrix which is 2 doses 2-6 months apart and over 90% effective     Covid-19: recommend 2 doses one month apart with a booster 6 months later

## 2023-03-31 ENCOUNTER — Encounter: Payer: Self-pay | Admitting: Family Medicine

## 2023-03-31 ENCOUNTER — Ambulatory Visit (INDEPENDENT_AMBULATORY_CARE_PROVIDER_SITE_OTHER): Payer: Medicare Other | Admitting: Family Medicine

## 2023-03-31 VITALS — BP 138/104 | HR 94 | Temp 97.5°F | Ht 72.75 in | Wt 304.2 lb

## 2023-03-31 DIAGNOSIS — E559 Vitamin D deficiency, unspecified: Secondary | ICD-10-CM

## 2023-03-31 DIAGNOSIS — Z7189 Other specified counseling: Secondary | ICD-10-CM

## 2023-03-31 DIAGNOSIS — E538 Deficiency of other specified B group vitamins: Secondary | ICD-10-CM

## 2023-03-31 DIAGNOSIS — Z1159 Encounter for screening for other viral diseases: Secondary | ICD-10-CM

## 2023-03-31 DIAGNOSIS — I10 Essential (primary) hypertension: Secondary | ICD-10-CM

## 2023-03-31 DIAGNOSIS — E1169 Type 2 diabetes mellitus with other specified complication: Secondary | ICD-10-CM

## 2023-03-31 DIAGNOSIS — N528 Other male erectile dysfunction: Secondary | ICD-10-CM

## 2023-03-31 DIAGNOSIS — N481 Balanitis: Secondary | ICD-10-CM | POA: Insufficient documentation

## 2023-03-31 DIAGNOSIS — G894 Chronic pain syndrome: Secondary | ICD-10-CM

## 2023-03-31 DIAGNOSIS — R790 Abnormal level of blood mineral: Secondary | ICD-10-CM

## 2023-03-31 LAB — MICROALBUMIN / CREATININE URINE RATIO
Creatinine,U: 58.4 mg/dL
Microalb Creat Ratio: 1.2 mg/g (ref 0.0–30.0)
Microalb, Ur: <0.7 mg/dL (ref 0.0–1.9)

## 2023-03-31 LAB — COMPREHENSIVE METABOLIC PANEL
ALT: 13 U/L (ref 0–53)
AST: 10 U/L (ref 0–37)
Albumin: 4.4 g/dL (ref 3.5–5.2)
Alkaline Phosphatase: 65 U/L (ref 39–117)
BUN: 13 mg/dL (ref 6–23)
CO2: 28 mEq/L (ref 19–32)
Calcium: 9 mg/dL (ref 8.4–10.5)
Chloride: 97 mEq/L (ref 96–112)
Creatinine, Ser: 0.8 mg/dL (ref 0.40–1.50)
GFR: 105.35 mL/min (ref >60.00)
Glucose, Bld: 330 mg/dL — ABNORMAL HIGH (ref 70–99)
Potassium: 4.3 mEq/L (ref 3.5–5.1)
Sodium: 134 mEq/L — ABNORMAL LOW (ref 135–145)
Total Bilirubin: 0.9 mg/dL (ref 0.2–1.2)
Total Protein: 7.1 g/dL (ref 6.0–8.3)

## 2023-03-31 LAB — CBC WITH DIFFERENTIAL/PLATELET
Basophils Absolute: 0 10*3/uL (ref 0.0–0.1)
Basophils Relative: 0.7 % (ref 0.0–3.0)
Eosinophils Absolute: 0.1 10*3/uL (ref 0.0–0.7)
Eosinophils Relative: 1.3 % (ref 0.0–5.0)
HCT: 45 % (ref 39.0–52.0)
Hemoglobin: 16 g/dL (ref 13.0–17.0)
Lymphocytes Relative: 29.1 % (ref 12.0–46.0)
Lymphs Abs: 1.7 10*3/uL (ref 0.7–4.0)
MCHC: 35.6 g/dL (ref 30.0–36.0)
MCV: 84.5 fl (ref 78.0–100.0)
Monocytes Absolute: 0.6 10*3/uL (ref 0.1–1.0)
Monocytes Relative: 11.3 % (ref 3.0–12.0)
Neutro Abs: 3.3 10*3/uL (ref 1.4–7.7)
Neutrophils Relative %: 57.6 % (ref 43.0–77.0)
Platelets: 249 10*3/uL (ref 150.0–400.0)
RBC: 5.32 Mil/uL (ref 4.22–5.81)
RDW: 13 % (ref 11.5–15.5)
WBC: 5.7 10*3/uL (ref 4.0–10.5)

## 2023-03-31 LAB — HEMOGLOBIN A1C: Hgb A1c MFr Bld: 13.6 % — ABNORMAL HIGH (ref 4.6–6.5)

## 2023-03-31 LAB — LIPID PANEL
Cholesterol: 195 mg/dL (ref 0–200)
HDL: 43.8 mg/dL (ref >39.00)
LDL Cholesterol: 118 mg/dL — ABNORMAL HIGH (ref 0–99)
NonHDL: 150.7
Total CHOL/HDL Ratio: 4
Triglycerides: 162 mg/dL — ABNORMAL HIGH (ref 0.0–149.0)
VLDL: 32.4 mg/dL (ref 0.0–40.0)

## 2023-03-31 LAB — VITAMIN D 25 HYDROXY (VIT D DEFICIENCY, FRACTURES): VITD: 47.39 ng/mL (ref 30.00–100.00)

## 2023-03-31 LAB — POCT GLYCOSYLATED HEMOGLOBIN (HGB A1C): Hemoglobin A1C: 13.1 % — AB (ref 4.0–5.6)

## 2023-03-31 LAB — VITAMIN B12: Vitamin B-12: 873 pg/mL (ref 211–911)

## 2023-03-31 LAB — TSH: TSH: 3.13 u[IU]/mL (ref 0.35–5.50)

## 2023-03-31 MED ORDER — BLOOD GLUCOSE TEST VI STRP: 1.0000 | ORAL_STRIP | Freq: Three times a day (TID) | 0 refills | Status: AC

## 2023-03-31 MED ORDER — LANCETS MISC. MISC: 1.0000 | Freq: Three times a day (TID) | 0 refills | Status: AC

## 2023-03-31 MED ORDER — NYSTATIN 100000 UNIT/GM EX CREA: TOPICAL_CREAM | CUTANEOUS | 0 refills | Status: DC

## 2023-03-31 MED ORDER — LISINOPRIL 40 MG PO TABS: 40.0000 mg | ORAL_TABLET | Freq: Every day | ORAL | 4 refills | Status: DC

## 2023-03-31 MED ORDER — LANCET DEVICE MISC: 1.0000 | Freq: Three times a day (TID) | 0 refills | Status: AC

## 2023-03-31 MED ORDER — BLOOD GLUCOSE MONITORING SUPPL DEVI: 1.0000 | Freq: Three times a day (TID) | 0 refills | Status: DC

## 2023-03-31 MED ORDER — SILDENAFIL CITRATE 100 MG PO TABS: 50.0000 mg | ORAL_TABLET | Freq: Every day | ORAL | 3 refills | Status: DC | PRN

## 2023-03-31 NOTE — Assessment & Plan Note (Signed)
Update levels on regular replacement.  °

## 2023-03-31 NOTE — Progress Notes (Signed)
Ph: [phone]       Fax: [phone]   Patient ID: Larry Goodwin, male    DOB: [birth year], 47 y.o.   MRN: [id number]  This visit was conducted in person.  BP (!) 138/104 (BP Location: Right Arm, Cuff Size: Large)   Pulse 94   Temp (!) 97.5 F (36.4 C) (Temporal)   Ht 6' 0.75" (1.848 m)   Wt (!) 304 lb 3 oz (138 kg)   SpO2 96%   BMI 40.41 kg/m    CC: CPE Subjective:   HPI: SAGE Larry Goodwin is a 47 y.o. male presenting on 03/31/2023 for Annual Exam   Saw health advisor yesterday for medicare wellness visit. Note reviewed.  Now lives in [place].   No results found.  Flowsheet Row Office Visit from 03/31/2023 in [facility] [place]  PHQ-2 Total Score 0          03/31/2023   10:30 AM 03/30/2023    2:38 PM 01/13/2023   11:37 AM 10/14/2022   10:36 AM 07/20/2022   10:26 AM  Fall Risk   Falls in the past year? 0 0 0 0 0  Number falls in past yr:  0 0 0   Injury with Fall?  0 0 0   Risk for fall due to :  No Fall Risks     Follow up  Falls prevention discussed;Falls evaluation completed       New twin girls 2022 - just turned 47 yo.   Sees pain clinic Shireen Quan) for chronic pain on oxycodone.    EGD 2009 - possible EoE [place] Larry Goodwin) - however biopsy not consistent with this. He is not on PPI. No heartburn symptoms. Symptoms improved after cholecystectomy.   H/o chronic sinus congestion and drainage, saw Matt NP virtually 2 wks ago with dx acute sinusitis treated with augmentin 10d course as well as flonase nasal spray. Didn't need nasal spray, symptoms improved.   HTN - takes lisinopril 40mg  at night time due to mild lightheadedness.  DM - planning to start low carb low sugar diet. Last eye exam 8 months ago.  Lab Results  Component Value Date   HGBA1C 13.1 (A) 03/31/2023    Preventative: Colon cancer screening - discussed options, reviewed reasons to screen and risk of missed cancer dx, declines testing at this time.  No fmhx  prostate cancer Flu shot - declines COVID vaccine - declines Tetanus shot - unknown date Pneumococcal - declines Advanced directive - does not have this set up. Would want Danielle wife to be HCPOA. Packet declined.  Seat belt use discussed  Sunscreen use discussed. Doesn't use but covers up including bucket hat. No changing moles on skin. Sunburns easily.  Ex -smoker quit 2009, previously social smoker  Alcohol - none Rec drugs - none Dentist - has full dentures, considering implants  Eye exam - done 8 months ago  Bowel - no constipation  Bladder - no incontinence    Lives with wife and 3 children, 2 dogs Occupation: football Psychologist, occupational at [employer], on disability due to back  Edu: HS Activity: coaching, walking regularly  Diet: good water, fruits/vegetables daily     Relevant past medical, surgical, family and social history reviewed and updated as indicated. Interim medical history since our last visit reviewed. Allergies and medications reviewed and updated. Outpatient Medications Prior to Visit  Medication Sig Dispense Refill   Cholecalciferol (VITAMIN D3) 125 MCG (5000 UT) CAPS Take 1 capsule (  5,000 Units total) by mouth in the morning and at bedtime.     Magnesium Oxide 500 MG CAPS Take 1 capsule (500 mg total) by mouth daily. 30 capsule 2   oxyCODONE (OXY IR/ROXICODONE) 5 MG immediate release tablet Take 1 tablet (5 mg total) by mouth every 6 (six) hours as needed for severe pain. Must last 30 days 120 tablet 0   vitamin B-12 (CYANOCOBALAMIN) 1000 MCG tablet Take 1,000 mcg by mouth daily.     fluticasone (FLONASE) 50 MCG/ACT nasal spray Place 2 sprays into both nostrils daily. 16 g 0   lisinopril (ZESTRIL) 40 MG tablet Take 1 tablet (40 mg total) by mouth daily. 90 tablet 3   nystatin cream (MYCOSTATIN) APPLY AS DIRECTED 2 TIMES DAILY. USE FOR 1 WEEK. 30 g 0   sildenafil (VIAGRA) 100 MG tablet Take 0.5-1 tablets (50-100 mg total) by mouth daily as needed for erectile  dysfunction. 5 tablet 3   oxyCODONE (OXY IR/ROXICODONE) 5 MG immediate release tablet Take 1 tablet (5 mg total) by mouth every 6 (six) hours as needed for severe pain. Must last 30 days 120 tablet 0   oxyCODONE (OXY IR/ROXICODONE) 5 MG immediate release tablet Take 1 tablet (5 mg total) by mouth every 6 (six) hours as needed for severe pain. Must last 30 days 120 tablet 0   amoxicillin-clavulanate (AUGMENTIN) 875-125 MG tablet Take 1 tablet by mouth 2 (two) times daily. (Patient not taking: Reported on 03/30/2023) 20 tablet 0   No facility-administered medications prior to visit.     Per HPI unless specifically indicated in ROS section below Review of Systems  Constitutional:  Negative for activity change, appetite change, chills, fatigue, fever and unexpected weight change.  HENT:  Negative for hearing loss.   Eyes:  Negative for visual disturbance.  Respiratory:  Negative for cough, chest tightness, shortness of breath and wheezing.   Cardiovascular:  Negative for chest pain, palpitations and leg swelling.  Gastrointestinal:  Negative for abdominal distention, abdominal pain, blood in stool, constipation, diarrhea, nausea and vomiting.  Genitourinary:  Negative for difficulty urinating and hematuria.  Musculoskeletal:  Negative for arthralgias, myalgias and neck pain.  Skin:  Negative for rash.  Neurological:  Negative for dizziness, seizures, syncope and headaches.  Hematological:  Negative for adenopathy. Does not bruise/bleed easily.  Psychiatric/Behavioral:  Negative for dysphoric mood. The patient is not nervous/anxious.     Objective:  BP (!) 138/104 (BP Location: Right Arm, Cuff Size: Large)   Pulse 94   Temp (!) 97.5 F (36.4 C) (Temporal)   Ht 6' 0.75" (1.848 m)   Wt (!) 304 lb 3 oz (138 kg)   SpO2 96%   BMI 40.41 kg/m   Wt Readings from Last 3 Encounters:  03/31/23 (!) 304 lb 3 oz (138 kg)  03/30/23 300 lb (136.1 kg)  03/18/23 300 lb (136.1 kg)      Physical  Exam Vitals and nursing note reviewed.  Constitutional:      General: He is not in acute distress.    Appearance: Normal appearance. He is well-developed. He is not ill-appearing.  HENT:     Head: Normocephalic and atraumatic.     Right Ear: Hearing, tympanic membrane, ear canal and external ear normal.     Left Ear: Hearing, tympanic membrane, ear canal and external ear normal.     Nose: Nose normal.     Mouth/Throat:     Mouth: Mucous membranes are moist.     Pharynx: Oropharynx  is clear. No oropharyngeal exudate or posterior oropharyngeal erythema.  Eyes:     General: No scleral icterus.    Extraocular Movements: Extraocular movements intact.     Conjunctiva/sclera: Conjunctivae normal.     Pupils: Pupils are equal, round, and reactive to light.  Neck:     Thyroid: No thyroid mass or thyromegaly.  Cardiovascular:     Rate and Rhythm: Normal rate and regular rhythm.     Pulses: Normal pulses.          Radial pulses are 2+ on the right side and 2+ on the left side.     Heart sounds: Normal heart sounds. No murmur heard. Pulmonary:     Effort: Pulmonary effort is normal. No respiratory distress.     Breath sounds: Normal breath sounds. No wheezing, rhonchi or rales.  Abdominal:     General: Bowel sounds are normal. There is no distension.     Palpations: Abdomen is soft. There is no mass.     Tenderness: There is no abdominal tenderness. There is no guarding or rebound.     Hernia: No hernia is present.  Musculoskeletal:        General: Normal range of motion.     Cervical back: Normal range of motion and neck supple.     Right lower leg: No edema.     Left lower leg: No edema.  Lymphadenopathy:     Cervical: No cervical adenopathy.  Skin:    General: Skin is warm and dry.     Findings: No rash.  Neurological:     General: No focal deficit present.     Mental Status: He is alert and oriented to person, place, and time.  Psychiatric:        Mood and Affect: Mood normal.         Behavior: Behavior normal.        Thought Content: Thought content normal.        Judgment: Judgment normal.         Assessment & Plan:   Problem List Items Addressed This Visit     Chronic pain syndrome (Chronic)    This is managed by pain clinic on oxycodone.       Advanced care planning/counseling discussion - Primary (Chronic)    Advanced directive - does not have this set up. Would want Danielle wife to be HCPOA. Packet declined.       HTN (hypertension)    Chronic, BP elevated today. Reassess at f/u visit       Relevant Medications   sildenafil (VIAGRA) 100 MG tablet   lisinopril (ZESTRIL) 40 MG tablet   Other Relevant Orders   TSH   CBC with Differential/Platelet   Obesity, morbid, BMI 40.0-49.9 (HCC)    Continue to encourage healthy diet and lifestyle choices to affect sustainable weight loss.       Type 2 diabetes mellitus with other specified complication    Chronic, markedly deteriorated over the past year. Reviewed reasons to treat diabetes as well as possible medications including metformin and weekly GLP1RA. He declines medication, declines diabetes education. He wants to work on diet and lifestyle changes for goal weight loss. Reviewed risks of uncontrolled diabetes including but not limited to heart attack, stroke, diabetic coma, kidney failure, blindness, and amputation. He does not have medicare prescription coverage (part D). Glucometer Rx sent to pharmacy.  He agrees to return in 3 months for DM f/u and if persistently high agrees to discuss  medication at that time.       Relevant Medications   lisinopril (ZESTRIL) 40 MG tablet   Other Relevant Orders   POCT glycosylated hemoglobin (Hb A1C) (Completed)   Hemoglobin A1c   Microalbumin / creatinine urine ratio   Lipid panel   Comprehensive metabolic panel   Low magnesium levels    Update Mg      Vitamin D deficiency    Update levels on regular replacement      Relevant Orders   VITAMIN  D 25 Hydroxy (Vit-D Deficiency, Fractures)   Vitamin B12 deficiency    Update levels on regular replacement      Relevant Orders   Vitamin B12   Erectile dysfunction    Viagra refilled      Relevant Medications   sildenafil (VIAGRA) 100 MG tablet   Balanitis    Describes episodes of balanitis - managed well with PRN nystatin. High sugar levels could predispose to infections like this.       Relevant Medications   nystatin cream (MYCOSTATIN)   Other Visit Diagnoses     Need for hepatitis C screening test       Relevant Orders   Hepatitis C antibody        Meds ordered this encounter  Medications   nystatin cream (MYCOSTATIN)    Sig: APPLY AS DIRECTED 2 TIMES DAILY    Dispense:  30 g    Refill:  0   sildenafil (VIAGRA) 100 MG tablet    Sig: Take 0.5-1 tablets (50-100 mg total) by mouth daily as needed for erectile dysfunction.    Dispense:  5 tablet    Refill:  3   lisinopril (ZESTRIL) 40 MG tablet    Sig: Take 1 tablet (40 mg total) by mouth daily.    Dispense:  90 tablet    Refill:  4   Blood Glucose Monitoring Suppl DEVI    Sig: 1 each by Does not apply route in the morning, at noon, and at bedtime. May substitute to any manufacturer covered by patient's insurance.    Dispense:  1 each    Refill:  0   Glucose Blood (BLOOD GLUCOSE TEST STRIPS) STRP    Sig: 1 each by In Vitro route in the morning, at noon, and at bedtime. May substitute to any manufacturer covered by patient's insurance.    Dispense:  100 strip    Refill:  0   Lancet Device MISC    Sig: 1 each by Does not apply route in the morning, at noon, and at bedtime. May substitute to any manufacturer covered by patient's insurance.    Dispense:  1 each    Refill:  0   Lancets Misc. MISC    Sig: 1 each by Does not apply route in the morning, at noon, and at bedtime. May substitute to any manufacturer covered by patient's insurance.    Dispense:  100 each    Refill:  0    Orders Placed This Encounter   Procedures   Hemoglobin A1c   Microalbumin / creatinine urine ratio   Lipid panel   Comprehensive metabolic panel   Vitamin B12   VITAMIN D 25 Hydroxy (Vit-D Deficiency, Fractures)   TSH   CBC with Differential/Platelet   Hepatitis C antibody   POCT glycosylated hemoglobin (Hb A1C)    Patient Instructions  A1c today too high 13.1%!  Labs today  Change diet - avoid sweetened beverages mainly drink water, work on low sugar  low carb diabetic diet - handout provided.  Work on weight loss through regular walking routine.  glucometer prescription sent to pharmacy - goal fasting sugars 80-120, goal sugars 2 hours after meal <180.  Let me know if interested in diabetes education classes.  Consider once weekly injectable medicine like Ozempic, Trulicity or Mounjaro.  Return in 3 months for diabetes follow up.   Follow up plan: Return in about 3 months (around 06/30/2023) for follow up visit.  Eustaquio Boyden, MD

## 2023-03-31 NOTE — Assessment & Plan Note (Signed)
Viagra refilled.

## 2023-03-31 NOTE — Assessment & Plan Note (Signed)
Chronic, BP elevated today. Reassess at f/u visit

## 2023-03-31 NOTE — Assessment & Plan Note (Signed)
Describes episodes of balanitis - managed well with PRN nystatin. High sugar levels could predispose to infections like this.

## 2023-03-31 NOTE — Assessment & Plan Note (Signed)
Continue to encourage healthy diet and lifestyle choices to affect sustainable weight loss.  °

## 2023-03-31 NOTE — Assessment & Plan Note (Addendum)
Chronic, markedly deteriorated over the past year. Reviewed reasons to treat diabetes as well as possible medications including metformin and weekly GLP1RA. He declines medication, declines diabetes education. He wants to work on diet and lifestyle changes for goal weight loss. Reviewed risks of uncontrolled diabetes including but not limited to heart attack, stroke, diabetic coma, kidney failure, blindness, and amputation. He does not have medicare prescription coverage (part D). Glucometer Rx sent to pharmacy.  He agrees to return in 3 months for DM f/u and if persistently high agrees to discuss medication at that time.

## 2023-03-31 NOTE — Assessment & Plan Note (Signed)
This is managed by pain clinic on oxycodone.

## 2023-03-31 NOTE — Assessment & Plan Note (Signed)
Update Mg

## 2023-03-31 NOTE — Patient Instructions (Addendum)
A1c today too high 13.1%!  Labs today  Change diet - avoid sweetened beverages mainly drink water, work on low sugar low carb diabetic diet - handout provided.  Work on weight loss through regular walking routine.  glucometer prescription sent to pharmacy - goal fasting sugars 80-120, goal sugars 2 hours after meal <180.  Let me know if interested in diabetes education classes.  Consider once weekly injectable medicine like Ozempic, Trulicity or Mounjaro.  Return in 3 months for diabetes follow up.

## 2023-03-31 NOTE — Assessment & Plan Note (Signed)
Advanced directive - does not have this set up. Would want Danielle wife to be HCPOA. Packet declined.

## 2023-04-01 LAB — HEPATITIS C ANTIBODY: Hepatitis C Ab: NONREACTIVE

## 2023-04-20 NOTE — Progress Notes (Unsigned)
PROVIDER NOTE: Information contained herein reflects review and annotations entered in association with encounter. Interpretation of such information and data should be left to medically-trained personnel. Information provided to patient can be located elsewhere in the medical record under "Patient Instructions". Document created using STT-dictation technology, any transcriptional errors that may result from process are unintentional.    Patient: Larry Goodwin  Service Category: E/M  Provider: Oswaldo Done, MD  DOB: 1975/12/17  DOS: 04/21/2023  Referring Provider: Eustaquio Boyden, MD  MRN: [id number]  Specialty: Interventional Pain Management  PCP: Eustaquio Boyden, MD  Type: Established Patient  Setting: Ambulatory outpatient    Location: Office  Delivery: Face-to-face     HPI  Larry Goodwin, a 47 y.o. year old male, is here today because of his Chronic pain syndrome [G89.4]. Larry Goodwin's primary complain today is No chief complaint on file.  Pertinent problems: Larry Goodwin has Lumbar DDD (degenerative disc disease); Chronic pain syndrome; Myofascial pain syndrome; Lumbar facet syndrome (Bilateral) (R>L); Chronic upper back pain (3ry area of Pain); Lumbar spondylosis with radiculopathy; Epidural fibrosis; Failed back surgical syndrome; Chronic low back pain (1ry area of Pain) (Bilateral) (R>L); Chronic sacroiliac joint pain (Right); Abnormal MRI, lumbar spine;  Abnormal CT myelogram of the thoracic spine; Thoracic disc herniation; Bulge of lumbar disc without myelopathy; Vertebral body hemangioma; Cervical IVDD (intervertebral disc displacement); 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 Pain Assessment: Severity of   is reported as a  /10. Location:    / . Onset:  . Quality:  . Timing:  . Modifying factor(s):  Marland Kitchen Vitals:  vitals were not taken for this visit.  BMI: Estimated body mass index is 40.41 kg/m as calculated from the following:   Height as of 03/31/23: 6' 0.75" (1.848 m).   Weight as of 03/31/23: 304 lb 3 oz (138 kg). Last encounter: 01/13/2023. Last procedure: Visit date not found.  Reason for encounter: medication management. ***  Routine UDS ordered today.   RTCB: 07/27/2023   Pharmacotherapy Assessment  Analgesic: Oxycodone IR 5 mg, 1 tab PO q 6 hrs (20 mg/day of oxycodone) MME/day: 30 mg/day.   Monitoring: [place] PMP: PDMP reviewed during this encounter.       Pharmacotherapy: No side-effects or adverse reactions reported. Compliance: No problems identified. Effectiveness: Clinically acceptable.  No notes on file  No results found for: "CBDTHCR" No results found for: "D8THCCBX" No results found for: "D9THCCBX"  UDS:  Summary  Date Value Ref Range Status  04/22/2022 Note  Final    Comment:    ==================================================================== [facility] (MW) ==================================================================== Test                             Result       Flag       Units  Drug Present and Declared for Prescription Verification   Oxycodone                      738          EXPECTED  ng/mg creat   Oxymorphone                    1021         EXPECTED   ng/mg creat   Noroxycodone                   1470         EXPECTED   ng/mg creat   Noroxymorphone                 406          EXPECTED   ng/mg creat    Sources of oxycodone are scheduled prescription medications.    Oxymorphone, noroxycodone,  and noroxymorphone are expected    metabolites of oxycodone. Oxymorphone is also available as a    scheduled prescription medication.  ==================================================================== Test                      Result    Flag   Units      Ref Range   Creatinine              53               mg/dL      >=16 ==================================================================== Declared Medications:  The flagging and interpretation on this report are based on the  following declared medications.  Unexpected results may arise from  inaccuracies in the declared medications.   **Note: The testing scope of this panel includes these medications:   Oxycodone   **Note: The testing scope of this panel does not include the  following reported medications:   Cyanocobalamin  Lisinopril (Zestril)  Magnesium (Mag-Ox)  Nystatin (Mycostatin)  Sildenafil (Viagra)  Vitamin D3 ==================================================================== For clinical consultation, please call [phone]. ====================================================================       ROS  Constitutional: Denies any fever or chills Gastrointestinal: No reported hemesis, hematochezia, vomiting, or acute GI distress Musculoskeletal: Denies any acute onset joint swelling, redness, loss of ROM, or weakness Neurological: No reported episodes of acute onset apraxia, aphasia, dysarthria, agnosia, amnesia, paralysis, loss of coordination, or loss of consciousness  Medication Review  BLOOD GLUCOSE TEST STRIPS, Blood Glucose Monitoring Suppl, Lancet Device, Lancets Misc., Magnesium Oxide -Mg Supplement, Vitamin D3, cyanocobalamin, lisinopril, nystatin cream, oxyCODONE, and sildenafil  History Review  Allergy: Larry Goodwin is allergic to levaquin [levofloxacin in d5w]. Drug: Larry Goodwin  reports current drug use. Drug: Oxycodone. Alcohol:  reports no history of alcohol use. Tobacco:  reports that  he quit smoking about 15 years ago. His smoking use included cigarettes. He has never used smokeless tobacco. Social: Larry Goodwin  reports that he quit smoking about 15 years ago. His smoking use included cigarettes. He has never used smokeless tobacco. He reports current drug use. Drug: Oxycodone. He reports that he does not drink alcohol. Medical:  has a past medical history of Bell's palsy (2015), Chronic pain syndrome, DDD (degenerative disc disease), lumbar, HTN (hypertension), and Morbid obesity (HCC) (08/16/2014). Surgical: Larry Goodwin  has a past surgical history that includes Back surgery (2012) and Cholecystectomy. Family: family history includes COPD in his mother; Hypertension in an other family member.  Laboratory Chemistry Profile   Renal Lab Results  Component Value Date   BUN 13 03/31/2023   CREATININE 0.80 03/31/2023   BCR NOT APPLICABLE 12/27/2020   GFR 105.35 03/31/2023   GFRAA 129 02/06/2019   GFRNONAA 112 02/06/2019    Hepatic Lab Results  Component  Value Date   AST 10 03/31/2023   ALT 13 03/31/2023   ALBUMIN 4.4 03/31/2023   ALKPHOS 65 03/31/2023   LIPASE 153 09/21/2012    Electrolytes Lab Results  Component Value Date   NA 134 (L) 03/31/2023   K 4.3 03/31/2023   CL 97 03/31/2023   CALCIUM 9.0 03/31/2023   MG 1.8 04/22/2022    Bone Lab Results  Component Value Date   VD25OH 47.39 03/31/2023   25OHVITD1 35 02/06/2019   25OHVITD2 <1.0 02/06/2019   25OHVITD3 34 02/06/2019    Inflammation (CRP: Acute Phase) (ESR: Chronic Phase) Lab Results  Component Value Date   CRP 9 02/06/2019   ESRSEDRATE 6 02/06/2019         Note: Above Lab results reviewed.  Recent Imaging Review  DG Foot Complete Right CLINICAL DATA:  Jarring foot 3 days prior, no fall  EXAM: RIGHT FOOT COMPLETE - 3+ VIEW  COMPARISON:  Radiograph 03/24/2012  FINDINGS: No acute bony abnormality. Specifically, no fracture, subluxation, or dislocation. Mild degenerative changes are  noted in the midfoot. Posterior calcaneal spur is present. Trace ankle effusion. Soft tissues are otherwise unremarkable  IMPRESSION: No acute fracture or traumatic malalignment.  Mild degenerative changes in the midfoot.  Posterior calcaneal spur.  Trace ankle effusion.  Electronically Signed   By: Kreg Shropshire M.D.   On: 02/12/2020 19:03 Note: Reviewed        Physical Exam  General appearance: Well nourished, well developed, and well hydrated. In no apparent acute distress Mental status: Alert, oriented x 3 (person, place, & time)       Respiratory: No evidence of acute respiratory distress Eyes: PERLA Vitals: There were no vitals taken for this visit. BMI: Estimated body mass index is 40.41 kg/m as calculated from the following:   Height as of 03/31/23: 6' 0.75" (1.848 m).   Weight as of 03/31/23: 304 lb 3 oz (138 kg). Ideal: Patient weight not recorded  Assessment   Diagnosis Status  1. Chronic pain syndrome   2. Pharmacologic therapy   3. Chronic sacroiliac joint pain (Right)   4. Chronic upper back pain (3ry area of Pain)   5. Chronic shoulder pain (4th area of Pain) (Bilateral) (Right)   6. Encounter for medication management   7. Chronic hip pain (2ry area of Pain) (Right)   8. Encounter for chronic pain management   9. Chronic knee pain (Right)   10. Chronic use of opiate for therapeutic purpose   11. Chronic low back pain (1ry area of Pain) (Bilateral) (R>L)    Controlled Controlled Controlled   Updated Problems: No problems updated.  Plan of Care  Problem-specific:  No problem-specific Assessment & Plan notes found for this encounter.  Larry Goodwin has a current medication list which includes the following long-term medication(s): lisinopril, magnesium oxide -mg supplement, oxycodone, oxycodone, and sildenafil.  Pharmacotherapy (Medications Ordered): No orders of the defined types were placed in this encounter.  Orders:  No orders of the  defined types were placed in this encounter.  Follow-up plan:   No follow-ups on file.      Interventional Therapies  Risk  Complexity Considerations:   Estimated body mass index is 38.52 kg/m as calculated from the following:   Height as of this encounter: 6\' 2"  (1.88 m).   Weight as of this encounter: 300 lb (136.1 kg). WNL   Planned  Pending:      Under consideration:   None at this time.  Completed:   None since joining practice in 02/06/2019   Therapeutic  Palliative (PRN) options:   None established   Pharmacotherapy  Nonopioids transfer 10/28/2020: Magnesium (OTC)        Recent Visits No visits were found meeting these conditions. Showing recent visits within past 90 days and meeting all other requirements Future Appointments Date Type Provider Dept  04/21/23 Appointment Delano Metz, MD [facility]  Showing future appointments within next 90 days and meeting all other requirements  I discussed the assessment and treatment plan with the patient. The patient was provided an opportunity to ask questions and all were answered. The patient agreed with the plan and demonstrated an understanding of the instructions.  Patient advised to call back or seek an in-person evaluation if the symptoms or condition worsens.  Duration of encounter: *** minutes.  Total time on encounter, as per AMA guidelines included both the face-to-face and non-face-to-face time personally spent by the physician and/or other qualified health care professional(s) on the day of the encounter (includes time in activities that require the physician or other qualified health care professional and does not include time in activities normally performed by clinical staff). Physician's time may include the following activities when performed: Preparing to see the patient (e.g., pre-charting review of records, searching for previously ordered imaging, lab work, and nerve conduction  tests) Review of prior analgesic pharmacotherapies. Reviewing PMP Interpreting ordered tests (e.g., lab work, imaging, nerve conduction tests) Performing post-procedure evaluations, including interpretation of diagnostic procedures Obtaining and/or reviewing separately obtained history Performing a medically appropriate examination and/or evaluation Counseling and educating the patient/family/caregiver Ordering medications, tests, or procedures Referring and communicating with other health care professionals (when not separately reported) Documenting clinical information in the electronic or other health record Independently interpreting results (not separately reported) and communicating results to the patient/ family/caregiver Care coordination (not separately reported)  Note by: Oswaldo Done, MD Date: 04/21/2023; Time: 12:12 PM

## 2023-04-20 NOTE — Patient Instructions (Signed)
____________________________________________________________________________________________  Opioid Pain Medication Update  To: All patients taking opioid pain medications. (I.e.: hydrocodone, hydromorphone, oxycodone, oxymorphone, morphine, codeine, methadone, tapentadol, tramadol, buprenorphine, fentanyl, etc.)  Re: Updated review of side effects and adverse reactions of opioid analgesics, as well as new information about long term effects of this class of medications.  Direct risks of long-term opioid therapy are not limited to opioid addiction and overdose. Potential medical risks include serious fractures, breathing problems during sleep, hyperalgesia, immunosuppression, chronic constipation, bowel obstruction, myocardial infarction, and tooth decay secondary to xerostomia.  Unpredictable adverse effects that can occur even if you take your medication correctly: Cognitive impairment, respiratory depression, and death. Most people think that if they take their medication "correctly", and "as instructed", that they will be safe. Nothing could be farther from the truth. In reality, a significant amount of recorded deaths associated with the use of opioids has occurred in individuals that had taken the medication for a long time, and were taking their medication correctly. The following are examples of how this can happen: Patient taking his/her medication for a long time, as instructed, without any side effects, is given a certain antibiotic or another unrelated medication, which in turn triggers a "Drug-to-drug interaction" leading to disorientation, cognitive impairment, impaired reflexes, respiratory depression or an untoward event leading to serious bodily harm or injury, including death.  Patient taking his/her medication for a long time, as instructed, without any side effects, develops an acute impairment of liver and/or kidney function. This will lead to a rapid inability of the body to  breakdown and eliminate their pain medication, which will result in effects similar to an "overdose", but with the same medicine and dose that they had always taken. This again may lead to disorientation, cognitive impairment, impaired reflexes, respiratory depression or an untoward event leading to serious bodily harm or injury, including death.  A similar problem will occur with patients as they grow older and their liver and kidney function begins to decrease as part of the aging process.  Background information: Historically, the original case for using long-term opioid therapy to treat chronic noncancer pain was based on safety assumptions that subsequent experience has called into question. In 1996, the American Pain Society and the American Academy of Pain Medicine issued a consensus statement supporting long-term opioid therapy. This statement acknowledged the dangers of opioid prescribing but concluded that the risk for addiction was low; respiratory depression induced by opioids was short-lived, occurred mainly in opioid-naive patients, and was antagonized by pain; tolerance was not a common problem; and efforts to control diversion should not constrain opioid prescribing. This has now proven to be wrong. Experience regarding the risks for opioid addiction, misuse, and overdose in community practice has failed to support these assumptions.  According to the Centers for Disease Control and Prevention, fatal overdoses involving opioid analgesics have increased sharply over the past decade. Currently, more than 96,700 people die from drug overdoses every year. Opioids are a factor in 7 out of every 10 overdose deaths. Deaths from drug overdose have surpassed motor vehicle accidents as the leading cause of death for individuals between the ages of 35 and 54.  Clinical data suggest that neuroendocrine dysfunction may be very common in both men and women, potentially causing hypogonadism, erectile  dysfunction, infertility, decreased libido, osteoporosis, and depression. Recent studies linked higher opioid dose to increased opioid-related mortality. Controlled observational studies reported that long-term opioid therapy may be associated with increased risk for cardiovascular events. Subsequent meta-analysis concluded   that the safety of long-term opioid therapy in elderly patients has not been proven.   Side Effects and adverse reactions: Common side effects: Drowsiness (sedation). Dizziness. Nausea and vomiting. Constipation. Physical dependence -- Dependence often manifests with withdrawal symptoms when opioids are discontinued or decreased. Tolerance -- As you take repeated doses of opioids, you require increased medication to experience the same effect of pain relief. Respiratory depression -- This can occur in healthy people, especially with higher doses. However, people with COPD, asthma or other lung conditions may be even more susceptible to fatal respiratory impairment.  Uncommon side effects: An increased sensitivity to feeling pain and extreme response to pain (hyperalgesia). Chronic use of opioids can lead to this. Delayed gastric emptying (the process by which the contents of your stomach are moved into your small intestine). Muscle rigidity. Immune system and hormonal dysfunction. Quick, involuntary muscle jerks (myoclonus). Arrhythmia. Itchy skin (pruritus). Dry mouth (xerostomia).  Long-term side effects: Chronic constipation. Sleep-disordered breathing (SDB). Increased risk of bone fractures. Hypothalamic-pituitary-adrenal dysregulation. Increased risk of overdose.  RISKS: Fractures and Falls:  Opioids increase the risk and incidence of falls. This is of particular importance in elderly patients.  Endocrine System:  Long-term administration is associated with endocrine abnormalities (endocrinopathies). (Also known as Opioid-induced Endocrinopathy) Influences  on both the hypothalamic-pituitary-adrenal axis?and the hypothalamic-pituitary-gonadal axis have been demonstrated with consequent hypogonadism and adrenal insufficiency in both sexes. Hypogonadism and decreased levels of dehydroepiandrosterone sulfate have been reported in men and women. Endocrine effects include: Amenorrhoea in women (abnormal absence of menstruation) Reduced libido in both sexes Decreased sexual function Erectile dysfunction in men Hypogonadisms (decreased testicular function with shrinkage of testicles) Infertility Depression and fatigue Loss of muscle mass Anxiety Depression Immune suppression Hyperalgesia Weight gain Anemia Osteoporosis Patients (particularly women of childbearing age) should avoid opioids. There is insufficient evidence to recommend routine monitoring of asymptomatic patients taking opioids in the long-term for hormonal deficiencies.  Immune System: Human studies have demonstrated that opioids have an immunomodulating effect. These effects are mediated via opioid receptors both on immune effector cells and in the central nervous system. Opioids have been demonstrated to have adverse effects on antimicrobial response and anti-tumour surveillance. Buprenorphine has been demonstrated to have no impact on immune function.  Opioid Induced Hyperalgesia: Human studies have demonstrated that prolonged use of opioids can lead to a state of abnormal pain sensitivity, sometimes called opioid induced hyperalgesia (OIH). Opioid induced hyperalgesia is not usually seen in the absence of tolerance to opioid analgesia. Clinically, hyperalgesia may be diagnosed if the patient on long-term opioid therapy presents with increased pain. This might be qualitatively and anatomically distinct from pain related to disease progression or to breakthrough pain resulting from development of opioid tolerance. Pain associated with hyperalgesia tends to be more diffuse than the  pre-existing pain and less defined in quality. Management of opioid induced hyperalgesia requires opioid dose reduction.  Cancer: Chronic opioid therapy has been associated with an increased risk of cancer among noncancer patients with chronic pain. This association was more evident in chronic strong opioid users. Chronic opioid consumption causes significant pathological changes in the small intestine and colon. Epidemiological studies have found that there is a link between opium dependence and initiation of gastrointestinal cancers. Cancer is the second leading cause of death after cardiovascular disease. Chronic use of opioids can cause multiple conditions such as GERD, immunosuppression and renal damage as well as carcinogenic effects, which are associated with the incidence of cancers.   Mortality: Long-term opioid use   has been associated with increased mortality among patients with chronic non-cancer pain (CNCP).  Prescription of long-acting opioids for chronic noncancer pain was associated with a significantly increased risk of all-cause mortality, including deaths from causes other than overdose.  Reference: Von Korff M, Kolodny A, Deyo RA, Chou R. Long-term opioid therapy reconsidered. Ann Intern Med. 2011 Sep 6;155(5):325-8. doi: 10.7326/[id number]. PMID: 21893626; PMCID: PMC3280085. Bedson J, Chen Y, Ashworth J, Hayward RA, Dunn KM, Jordan KP. Risk of adverse events in patients prescribed long-term opioids: A cohort study in the [place] Clinical Practice Research Datalink. Eur J Pain. 2019 May;23(5):908-922. doi: 10.1002/ejp.1357. Epub 2019 Jan 31. PMID: 30620116. Colameco S, Coren JS, Ciervo CA. Continuous opioid treatment for chronic noncancer pain: a time for moderation in prescribing. Postgrad Med. 2009 Jul;121(4):61-6. doi: 10.3810/pgm.2009.07.2032. PMID: 19641271. Chou R, Turner JA, Devine EB, Hansen RN, Sullivan SD, Blazina I, Dana T, Bougatsos C, Deyo RA. The  effectiveness and risks of long-term opioid therapy for chronic pain: a systematic review for a National Institutes of Health Pathways to Prevention Workshop. Ann Intern Med. 2015 Feb 17;162(4):276-86. doi: 10.7326/M14-2559. PMID: 25581257. Warner M, Chen LH, Makuc DM. NCHS Data Brief No. 22. [place]: Centers for Disease Control and Prevention; 2009. Sep, Increase in Fatal Poisonings Involving Opioid Analgesics in the [place], 1999-2006. Song IA, Choi HR, Oh TK. Long-term opioid use and mortality in patients with chronic non-cancer pain: Ten-year follow-up study in [place] from 2010 through 2019. EClinicalMedicine. 2022 Jul 18;51:101558. doi: 10.1016/j.eclinm.2022.101558. PMID: 35875817; PMCID: PMC9304910. Huser, W., Schubert, T., Vogelmann, T. et al. All-cause mortality in patients with long-term opioid therapy compared with non-opioid analgesics for chronic non-cancer pain: a database study. BMC Med 18, 162 (2020). https://doi.org/10.1186/[email or web address] Rashidian H, Zendehdel K, Kamangar F, Malekzadeh R, Haghdoost AA. An Ecological Study of the Association between Opiate Use and Incidence of Cancers. Addict Health. 2016 Fall;8(4):252-260. PMID: 28819556; PMCID: PMC5554805.  Our Goal: Our goal is to control your pain with means other than the use of opioid pain medications.  Our Recommendation: Talk to your physician about coming off of these medications. We can assist you with the tapering down and stopping these medicines. Based on the new information, even if you cannot completely stop the medication, a decrease in the dose may be associated with a lesser risk. Ask for other means of controlling the pain. Decrease or eliminate those factors that significantly contribute to your pain such as smoking, obesity, and a diet heavily tilted towards "inflammatory" nutrients.  Last Updated: 02/10/2023    ____________________________________________________________________________________________     ____________________________________________________________________________________________  Patient Information update  To: All of our patients.  Re: Name change.  It has been made official that our current name, "[facility]"   will soon be changed to "[facility]".   The purpose of this change is to eliminate any confusion created by the concept of our practice being a "Medication Management Pain Clinic". In the past this has led to the misconception that we treat pain primarily by the use of prescription medications.  Nothing can be farther from the truth.   Understanding PAIN MANAGEMENT: To further understand what our practice does, you first have to understand that "Pain Management" is a subspecialty that requires additional training once a physician has completed their specialty training, which can be in either Anesthesia, Neurology, Psychiatry, or Physical Medicine and Rehabilitation (PMR). Each one of these contributes to the final approach taken by each physician to   the management of their patient's pain. To be a "Pain Management Specialist" you must have first completed one of the specialty trainings below.  Anesthesiologists - trained in clinical pharmacology and interventional techniques such as nerve blockade and regional as well as central neuroanatomy. They are trained to block pain before, during, and after surgical interventions.  Neurologists - trained in the diagnosis and pharmacological treatment of complex neurological conditions, such as Multiple Sclerosis, Parkinson's, spinal cord injuries, and other systemic conditions that may be associated with symptoms that may include but are not limited to pain. They tend to rely primarily on the treatment of chronic pain  using prescription medications.  Psychiatrist - trained in conditions affecting the psychosocial wellbeing of patients including but not limited to depression, anxiety, schizophrenia, personality disorders, addiction, and other substance use disorders that may be associated with chronic pain. They tend to rely primarily on the treatment of chronic pain using prescription medications.   Physical Medicine and Rehabilitation (PMR) physicians, also known as physiatrists - trained to treat a wide variety of medical conditions affecting the brain, spinal cord, nerves, bones, joints, ligaments, muscles, and tendons. Their training is primarily aimed at treating patients that have suffered injuries that have caused severe physical impairment. Their training is primarily aimed at the physical therapy and rehabilitation of those patients. They may also work alongside orthopedic surgeons or neurosurgeons using their expertise in assisting surgical patients to recover after their surgeries.  INTERVENTIONAL PAIN MANAGEMENT is sub-subspecialty of Pain Management.  Our physicians are Board-certified in Anesthesia, Pain Management, and Interventional Pain Management.  This meaning that not only have they been trained and Board-certified in their specialty of Anesthesia, and subspecialty of Pain Management, but they have also received further training in the sub-subspecialty of Interventional Pain Management, in order to become Board-certified as INTERVENTIONAL PAIN MANAGEMENT SPECIALIST.    Mission: Our goal is to use our skills in  INTERVENTIONAL PAIN MANAGEMENT as alternatives to the chronic use of prescription opioid medications for the treatment of pain. To make this more clear, we have changed our name to reflect what we do and offer. We will continue to offer medication management assessment and recommendations, but we will not be taking over any patient's medication  management.  ____________________________________________________________________________________________     ____________________________________________________________________________________________  National Pain Medication Shortage  The [place] is experiencing worsening drug shortages. These have had a negative widespread effect on patient care and treatment. Not expected to improve any time soon. Predicted to last past 2029.   Drug shortage list (generic names) Oxycodone IR Oxycodone/APAP Oxymorphone IR Hydromorphone Hydrocodone/APAP Morphine  Where is the problem?  Manufacturing and supply level.  Will this shortage affect you?  Only if you take any of the above pain medications.  How? You may be unable to fill your prescription.  Your pharmacist may offer a "partial fill" of your prescription. (Warning: Do not accept partial fills.) Prescriptions partially filled cannot be transferred to another pharmacy. Read our Medication Rules and Regulation. Depending on how much medicine you are dependent on, you may experience withdrawals when unable to get the medication.  Recommendations: Consider ending your dependence on opioid pain medications. Ask your pain specialist to assist you with the process. Consider switching to a medication currently not in shortage, such as Buprenorphine. Talk to your pain specialist about this option. Consider decreasing your pain medication requirements by managing tolerance thru "Drug Holidays". This may help minimize withdrawals, should you run out of medicine. Control your pain thru   the use of non-pharmacological interventional therapies.   Your prescriber: Prescribers cannot be blamed for shortages. Medication manufacturing and supply issues cannot be fixed by the prescriber.   NOTE: The prescriber is not responsible for supplying the medication, or solving supply issues. Work with your pharmacist to solve it. The patient is responsible for  the decision to take or continue taking the medication and for identifying and securing a legal supply source. By law, supplying the medication is the job and responsibility of the pharmacy. The prescriber is responsible for the evaluation, monitoring, and prescribing of these medications.   Prescribers will NOT: Re-issue prescriptions that have been partially filled. Re-issue prescriptions already sent to a pharmacy.  Re-send prescriptions to a different pharmacy because yours did not have your medication. Ask pharmacist to order more medicine or transfer the prescription to another pharmacy. (Read below.)  New 2023 regulation: "August 14, 2022 Revised Regulation Allows DEA-Registered Pharmacies to Transfer Electronic Prescriptions at a Patient's Request DEA Headquarters Division - Public Information Office Patients now have the ability to request their electronic prescription be transferred to another pharmacy without having to go back to their practitioner to initiate the request. This revised regulation went into effect on Monday, August 10, 2022.     At a patient's request, a DEA-registered retail pharmacy can now transfer an electronic prescription for a controlled substance (schedules II-V) to another DEA-registered retail pharmacy. Prior to this change, patients would have to go through their practitioner to cancel their prescription and have it re-issued to a different pharmacy. The process was taxing and time consuming for both patients and practitioners.    The Drug Enforcement Administration (DEA) published its intent to revise the process for transferring electronic prescriptions on November 01, 2020.  The final rule was published in the federal register on July 09, 2022 and went into effect 30 days later.  Under the final rule, a prescription can only be transferred once between pharmacies, and only if allowed under existing state or other applicable law. The prescription must  remain in its electronic form; may not be altered in any way; and the transfer must be communicated directly between two licensed pharmacists. It's important to note, any authorized refills transfer with the original prescription, which means the entire prescription will be filled at the same pharmacy".  Reference: https://www.dea.gov/stories/2023/2023-08/2022-09-01/revised-regulation-allows-dea-registered-pharmacies-transfer (DEA website announcement)  https://www.govinfo.gov/content/pkg/FR-2022-07-09/pdf/2023-15847.pdf (Federal Register  Department of Justice)   Federal Register / Vol. 88, No. 143 / Thursday, July 09, 2022 / Rules and Regulations DEPARTMENT OF JUSTICE  Drug Enforcement Administration  21 CFR Part 1306  [Docket No. [id number]]  RIN [id number]-AB64 Transfer of Electronic Prescriptions for Schedules II-V Controlled Substances Between Pharmacies for Initial Filling  ____________________________________________________________________________________________     ____________________________________________________________________________________________  Transfer of Pain Medication between Pharmacies  Re: 2023 DEA Clarification on existing regulation  Published on DEA Website: August 14, 2022  Title: Revised Regulation Allows DEA-Registered Pharmacies to Transfer Electronic Prescriptions at a Patient's Request DEA Headquarters Division - Public Information Office  "Patients now have the ability to request their electronic prescription be transferred to another pharmacy without having to go back to their practitioner to initiate the request. This revised regulation went into effect on Monday, August 10, 2022.     At a patient's request, a DEA-registered retail pharmacy can now transfer an electronic prescription for a controlled substance (schedules II-V) to another DEA-registered retail pharmacy. Prior to this change, patients would have to go through their practitioner to  cancel their prescription   and have it re-issued to a different pharmacy. The process was taxing and time consuming for both patients and practitioners.    The Drug Enforcement Administration (DEA) published its intent to revise the process for transferring electronic prescriptions on November 01, 2020.  The final rule was published in the federal register on July 09, 2022 and went into effect 30 days later.  Under the final rule, a prescription can only be transferred once between pharmacies, and only if allowed under existing state or other applicable law. The prescription must remain in its electronic form; may not be altered in any way; and the transfer must be communicated directly between two licensed pharmacists. It's important to note, any authorized refills transfer with the original prescription, which means the entire prescription will be filled at the same pharmacy."    REFERENCES: 1. DEA website announcement https://www.dea.gov/stories/2023/2023-08/2022-09-01/revised-regulation-allows-dea-registered-pharmacies-transfer  2. Department of Justice website  https://www.govinfo.gov/content/pkg/FR-2022-07-09/pdf/2023-15847.pdf  3. DEPARTMENT OF JUSTICE Drug Enforcement Administration 21 CFR Part 1306 [Docket No. [id number]] RIN [id number] "Transfer of Electronic Prescriptions for Schedules II-V Controlled Substances Between Pharmacies for Initial Filling"  ____________________________________________________________________________________________     _______________________________________________________________________  Medication Rules  Purpose: To inform patients, and their family members, of our medication rules and regulations.  Applies to: All patients receiving prescriptions from our practice (written or electronic).  Pharmacy of record: This is the pharmacy where your electronic prescriptions will be sent. Make sure we have the correct one.  Electronic prescriptions: In  compliance with the [place] Strengthen Opioid Misuse Prevention (STOP) Act of 2017 (Session Law 2017-74/H243), effective December 14, 2018, all controlled substances must be electronically prescribed. Written prescriptions, faxing, or calling prescriptions to a pharmacy will no longer be done.  Prescription refills: These will be provided only during in-person appointments. No medications will be renewed without a "face-to-face" evaluation with your provider. Applies to all prescriptions.  NOTE: The following applies primarily to controlled substances (Opioid* Pain Medications).   Type of encounter (visit): For patients receiving controlled substances, face-to-face visits are required. (Not an option and not up to the patient.)  Patient's responsibilities: Pain Pills: Bring all pain pills to every appointment (except for procedure appointments). Pill Bottles: Bring pills in original pharmacy bottle. Bring bottle, even if empty. Always bring the bottle of the most recent fill.  Medication refills: You are responsible for knowing and keeping track of what medications you are taking and when is it that you will need a refill. The day before your appointment: write a list of all prescriptions that need to be refilled. The day of the appointment: give the list to the admitting nurse. Prescriptions will be written only during appointments. No prescriptions will be written on procedure days. If you forget a medication: it will not be "Called in", "Faxed", or "electronically sent". You will need to get another appointment to get these prescribed. No early refills. Do not call asking to have your prescription filled early. Partial  or short prescriptions: Occasionally your pharmacy may not have enough pills to fill your prescription.  NEVER ACCEPT a partial fill or a prescription that is short of the total amount of pills that you were prescribed.  With controlled substances the law allows 72 hours for  the pharmacy to complete the prescription.  If the prescription is not completed within 72 hours, the pharmacist will require a new prescription to be written. This means that you will be short on your medicine and we WILL NOT send another prescription to complete your original   prescription.  Instead, request the pharmacy to send a carrier to a nearby branch to get enough medication to provide you with your full prescription. Prescription Accuracy: You are responsible for carefully inspecting your prescriptions before leaving our office. Have the discharge nurse carefully go over each prescription with you, before taking them home. Make sure that your name is accurately spelled, that your address is correct. Check the name and dose of your medication to make sure it is accurate. Check the number of pills, and the written instructions to make sure they are clear and accurate. Make sure that you are given enough medication to last until your next medication refill appointment. Taking Medication: Take medication as prescribed. When it comes to controlled substances, taking less pills or less frequently than prescribed is permitted and encouraged. Never take more pills than instructed. Never take the medication more frequently than prescribed.  Inform other Doctors: Always inform, all of your healthcare providers, of all the medications you take. Pain Medication from other Providers: You are not allowed to accept any additional pain medication from any other Doctor or Healthcare provider. There are two exceptions to this rule. (see below) In the event that you require additional pain medication, you are responsible for notifying us, as stated below. Cough Medicine: Often these contain an opioid, such as codeine or hydrocodone. Never accept or take cough medicine containing these opioids if you are already taking an opioid* medication. The combination may cause respiratory failure and death. Medication Agreement:  You are responsible for carefully reading and following our Medication Agreement. This must be signed before receiving any prescriptions from our practice. Safely store a copy of your signed Agreement. Violations to the Agreement will result in no further prescriptions. (Additional copies of our Medication Agreement are available upon request.) Laws, Rules, & Regulations: All patients are expected to follow all Federal and State Laws, Statutes, Rules, & Regulations. Ignorance of the Laws does not constitute a valid excuse.  Illegal drugs and Controlled Substances: The use of illegal substances (including, but not limited to marijuana and its derivatives) and/or the illegal use of any controlled substances is strictly prohibited. Violation of this rule may result in the immediate and permanent discontinuation of any and all prescriptions being written by our practice. The use of any illegal substances is prohibited. Adopted CDC guidelines & recommendations: Target dosing levels will be at or below 60 MME/day. Use of benzodiazepines** is not recommended.  Exceptions: There are only two exceptions to the rule of not receiving pain medications from other Healthcare Providers. Exception #1 (Emergencies): In the event of an emergency (i.e.: accident requiring emergency care), you are allowed to receive additional pain medication. However, you are responsible for: As soon as you are able, call our office [phone], at any time of the day or night, and leave a message stating your name, the date and nature of the emergency, and the name and dose of the medication prescribed. In the event that your call is answered by a member of our staff, make sure to document and save the date, time, and the name of the person that took your information.  Exception #2 (Planned Surgery): In the event that you are scheduled by another doctor or dentist to have any type of surgery or procedure, you are allowed (for a period no  longer than 30 days), to receive additional pain medication, for the acute post-op pain. However, in this case, you are responsible for picking up a copy of   our "Post-op Pain Management for Surgeons" handout, and giving it to your surgeon or dentist. This document is available at our office, and does not require an appointment to obtain it. Simply go to our office during business hours (Monday-Thursday from 8:00 AM to 4:00 PM) (Friday 8:00 AM to 12:00 Noon) or if you have a scheduled appointment with us, prior to your surgery, and ask for it by name. In addition, you are responsible for: calling our office [phone], at any time of the day or night, and leaving a message stating your name, name of your surgeon, type of surgery, and date of procedure or surgery. Failure to comply with your responsibilities may result in termination of therapy involving the controlled substances. Medication Agreement Violation. Following the above rules, including your responsibilities will help you in avoiding a Medication Agreement Violation ("Breaking your Pain Medication Contract").  Consequences:  Not following the above rules may result in permanent discontinuation of medication prescription therapy.  *Opioid medications include: morphine, codeine, oxycodone, oxymorphone, hydrocodone, hydromorphone, meperidine, tramadol, tapentadol, buprenorphine, fentanyl, methadone. **Benzodiazepine medications include: diazepam (Valium), alprazolam (Xanax), clonazepam (Klonopine), lorazepam (Ativan), clorazepate (Tranxene), chlordiazepoxide (Librium), estazolam (Prosom), oxazepam (Serax), temazepam (Restoril), triazolam (Halcion) (Last updated: 10/06/2022) ______________________________________________________________________    ______________________________________________________________________  Medication Recommendations and Reminders  Applies to: All patients receiving prescriptions (written and/or  electronic).  Medication Rules & Regulations: You are responsible for reading, knowing, and following our "Medication Rules" document. These exist for your safety and that of others. They are not flexible and neither are we. Dismissing or ignoring them is an act of "non-compliance" that may result in complete and irreversible termination of such medication therapy. For safety reasons, "non-compliance" will not be tolerated. As with the [place] fundamental legal principle of "ignorance of the law is no defense", we will accept no excuses for not having read and knowing the content of documents provided to you by our practice.  Pharmacy of record:  Definition: This is the pharmacy where your electronic prescriptions will be sent.  We do not endorse any particular pharmacy. It is up to you and your insurance to decide what pharmacy to use.  We do not restrict you in your choice of pharmacy. However, once we write for your prescriptions, we will NOT be re-sending more prescriptions to fix restricted supply problems created by your pharmacy, or your insurance.  The pharmacy listed in the electronic medical record should be the one where you want electronic prescriptions to be sent. If you choose to change pharmacy, simply notify our nursing staff. Changes will be made only during your regular appointments and not over the phone.  Recommendations: Keep all of your pain medications in a safe place, under lock and key, even if you live alone. We will NOT replace lost, stolen, or damaged medication. We do not accept "Police Reports" as proof of medications having been stolen. After you fill your prescription, take 1 week's worth of pills and put them away in a safe place. You should keep a separate, properly labeled bottle for this purpose. The remainder should be kept in the original bottle. Use this as your primary supply, until it runs out. Once it's gone, then you know that you have 1 week's worth of medicine,  and it is time to come in for a prescription refill. If you do this correctly, it is unlikely that you will ever run out of medicine. To make sure that the above recommendation works, it is very important that you make   sure your medication refill appointments are scheduled at least 1 week before you run out of medicine. To do this in an effective manner, make sure that you do not leave the office without scheduling your next medication management appointment. Always ask the nursing staff to show you in your prescription , when your medication will be running out. Then arrange for the receptionist to get you a return appointment, at least 7 days before you run out of medicine. Do not wait until you have 1 or 2 pills left, to come in. This is very poor planning and does not take into consideration that we may need to cancel appointments due to bad weather, sickness, or emergencies affecting our staff. DO NOT ACCEPT A "Partial Fill": If for any reason your pharmacy does not have enough pills/tablets to completely fill or refill your prescription, do not allow for a "partial fill". The law allows the pharmacy to complete that prescription within 72 hours, without requiring a new prescription. If they do not fill the rest of your prescription within those 72 hours, you will need a separate prescription to fill the remaining amount, which we will NOT provide. If the reason for the partial fill is your insurance, you will need to talk to the pharmacist about payment alternatives for the remaining tablets, but again, DO NOT ACCEPT A PARTIAL FILL, unless you can trust your pharmacist to obtain the remainder of the pills within 72 hours.  Prescription refills and/or changes in medication(s):  Prescription refills, and/or changes in dose or medication, will be conducted only during scheduled medication management appointments. (Applies to both, written and electronic prescriptions.) No refills on procedure days. No  medication will be changed or started on procedure days. No changes, adjustments, and/or refills will be conducted on a procedure day. Doing so will interfere with the diagnostic portion of the procedure. No phone refills. No medications will be "called into the pharmacy". No Fax refills. No weekend refills. No Holliday refills. No after hours refills.  Remember:  Business hours are:  Monday to Thursday 8:00 AM to 4:00 PM Provider's Schedule: Bentleigh Waren, MD - Appointments are:  Medication management: Monday and Wednesday 8:00 AM to 4:00 PM Procedure day: Tuesday and Thursday 7:30 AM to 4:00 PM Bilal Lateef, MD - Appointments are:  Medication management: Tuesday and Thursday 8:00 AM to 4:00 PM Procedure day: Monday and Wednesday 7:30 AM to 4:00 PM (Last update: 10/06/2022) ______________________________________________________________________    ____________________________________________________________________________________________  Drug Holidays  What is a "Drug Holiday"? Drug Holiday: is the name given to the process of slowly tapering down and temporarily stopping the pain medication for the purpose of decreasing or eliminating tolerance to the drug.  Benefits Improved effectiveness Decreased required effective dose Improved pain control End dependence on high dose therapy Decrease cost of therapy Uncovering "opioid-induced hyperalgesia". (OIH)  What is "opioid hyperalgesia"? It is a paradoxical increase in pain caused by exposure to opioids. Stopping the opioid pain medication, contrary to the expected, it actually decreases or completely eliminates the pain. Ref.: "A comprehensive review of opioid-induced hyperalgesia". Marion Lee, et.al. Pain Physician. 2011 Mar-Apr;14(2):145-61.  What is tolerance? Tolerance: the progressive loss of effectiveness of a pain medicine due to repetitive use. A common problem of opioid pain medications.  How long should a "Drug  Holiday" last? Effectiveness depends on the patient staying off all opioid pain medicines for a minimum of 14 consecutive days. (2 weeks)  How about just taking less of the medicine? Does not   work. Will not accomplish goal of eliminating the excess receptors.  How about switching to a different pain medicine? (AKA. "Opioid rotation") Does not work. Creates the illusion of effectiveness by taking advantage of inaccurate equivalent dose calculations between different opioids. -This "technique" was promoted by studies funded by pharmaceutical companies, such as [employer], creators of "OxyContin".  Can I stop the medicine "cold turkey"? We do not recommend it. You should always coordinate with your prescribing physician to make the transition as smoothly as possible. Avoid stopping the medicine abruptly without consulting. We recommend a "slow taper".  What is a slow taper? Taper: refers to the gradual decrease in dose.   How do I stop/taper the dose? Slowly. Decrease the daily amount of pills that you take by one (1) pill every seven (7) days. This is called a "slow downward taper". Example: if you normally take four (4) pills per day, drop it to three (3) pills per day for seven (7) days, then to two (2) pills per day for seven (7) days, then to one (1) per day for seven (7) days, and then stop the medicine. The 14 day "Drug Holiday" starts on the first day without medicine.   Will I experience withdrawals? Unlikely with a slow taper.  What triggers withdrawals? Withdrawals are triggered by the sudden/abrupt stop of high dose opioids. Withdrawals can be avoided by slowly decreasing the dose over a prolonged period of time.  What are withdrawals? Symptoms associated with sudden/abrupt reduction/stopping of high-dose, long-term use of pain medication. Withdrawal are seldom seen on low dose therapy, or patients rarely taking opioid medication.  Early Withdrawal Symptoms may  include: Agitation Anxiety Muscle aches Increased tearing Insomnia Runny nose Sweating Yawning  Late symptoms may include: Abdominal cramping Diarrhea Dilated pupils Goose bumps Nausea Vomiting  When could I see withdrawals? Onset: 8-24 hours after last use for most opioids. 12-48 hours for long-acting opioids (i.e.: methadone)  How long could they last? Duration: 4-10 days for most opioids. 14-21 days for long-acting opioids (i.e.: methadone)  What will happen after I complete my "Drug Holiday"? The need and indications for the opioid analgesic will be reviewed before restarting the medication. Dose requirements will likely decrease and the dose will need to be adjusted accordingly.   (Last update: 03/03/2023) ____________________________________________________________________________________________    ____________________________________________________________________________________________  WARNING: CBD (cannabidiol) & Delta (Delta-8 tetrahydrocannabinol) products.   Applicable to:  All individuals currently taking or considering taking CBD (cannabidiol) and, more important, all patients taking opioid analgesic controlled substances (pain medication). (Example: oxycodone; oxymorphone; hydrocodone; hydromorphone; morphine; methadone; tramadol; tapentadol; fentanyl; buprenorphine; butorphanol; dextromethorphan; meperidine; codeine; etc.)  Introduction:  Recently there has been a drive towards the use of "natural" products for the treatment of different conditions, including pain anxiety and sleep disorders. Marijuana and hemp are two varieties of the cannabis genus plants. Marijuana and its derivatives are illegal, while hemp and its derivatives are not. Cannabidiol (CBD) and tetrahydrocannabinol (THC), are two natural compounds found in plants of the Cannabis genus. They can both be extracted from hemp or marijuana. Both compounds interact with your body's endocannabinoid  system in very different ways. CBD is associated with pain relief (analgesia) while THC is associated with the psychoactive effects ("the high") obtained from the use of marijuana products. There are two main types of THC: Delta-9, which comes from the marijuana plant and it is illegal, and Delta-8, which comes from the hemp plant, and it is legal. (Both, Delta-9-THC and Delta-8-THC are psychoactive and   give you "the high".)   Legality:  Marijuana and its derivatives: illegal Hemp and its derivatives: Legal (State dependent) UPDATE: (01/30/2022) The Drug Enforcement Agency (DEA) issued a letter stating that "delta" cannabinoids, including Delta-8-THCO and Delta-9-THCO, synthetically derived from hemp do not qualify as hemp and will be viewed as Schedule I drugs. (Schedule I drugs, substances, or chemicals are defined as drugs with no currently accepted medical use and a high potential for abuse. Some examples of Schedule I drugs are: heroin, lysergic acid diethylamide (LSD), marijuana (cannabis), 3,4-methylenedioxymethamphetamine (ecstasy), methaqualone, and peyote.) (https://www.dea.gov)  Legal status of CBD in [place]:  "Conditionally Legal"  Reference: "FDA Regulation of Cannabis and Cannabis-Derived Products, Including Cannabidiol (CBD)" - https://www.fda.gov/news-events/public-health-focus/fda-regulation-cannabis-and-cannabis-derived-products-including-cannabidiol-cbd  Warning:  CBD is not FDA approved and has not undergo the same manufacturing controls as prescription drugs.  This means that the purity and safety of available CBD may be questionable. Most of the time, despite manufacturer's claims, it is contaminated with THC (delta-9-tetrahydrocannabinol - the chemical in marijuana responsible for the "HIGH").  When this is the case, the THC contaminant will trigger a positive urine drug screen (UDS) test for Marijuana (carboxy-THC).   The FDA recently put out a warning about 5 things that everyone  should be aware of regarding Delta-8 THC: Delta-8 THC products have not been evaluated or approved by the FDA for safe use and may be marketed in ways that put the public health at risk. The FDA has received adverse event reports involving delta-8 THC-containing products. Delta-8 THC has psychoactive and intoxicating effects. Delta-8 THC manufacturing often involve use of potentially harmful chemicals to create the concentrations of delta-8 THC claimed in the marketplace. The final delta-8 THC product may have potentially harmful by-products (contaminants) due to the chemicals used in the process. Manufacturing of delta-8 THC products may occur in uncontrolled or unsanitary settings, which may lead to the presence of unsafe contaminants or other potentially harmful substances. Delta-8 THC products should be kept out of the reach of children and pets.  NOTE: Because a positive UDS for any illicit substance is a violation of our medication agreement, your opioid analgesics (pain medicine) may be permanently discontinued.  MORE ABOUT CBD  General Information: CBD was discovered in 1940 and it is a derivative of the cannabis sativa genus plants (Marijuana and Hemp). It is one of the 113 identified substances found in Marijuana. It accounts for up to 40% of the plant's extract. As of 2018, preliminary clinical studies on CBD included research for the treatment of anxiety, movement disorders, and pain. CBD is available and consumed in multiple forms, including inhalation of smoke or vapor, as an aerosol spray, and by mouth. It may be supplied as an oil containing CBD, capsules, dried cannabis, or as a liquid solution. CBD is thought not to be as psychoactive as THC (delta-9-tetrahydrocannabinol - the chemical in marijuana responsible for the "HIGH"). Studies suggest that CBD may interact with different biological target receptors in the body, including cannabinoid and other neurotransmitter receptors. As of  2018 the mechanism of action for its biological effects has not been determined.  Side-effects  Adverse reactions: Dry mouth, diarrhea, decreased appetite, fatigue, drowsiness, malaise, weakness, sleep disturbances, and others.  Drug interactions:  CBD may interact with medications such as blood-thinners. CBD causes drowsiness on its own and it will increase drowsiness caused by other medications, including antihistamines (such as Benadryl), benzodiazepines (Xanax, Ativan, Valium), antipsychotics, antidepressants, opioids, alcohol and supplements such as kava, melatonin and St. John's Wort.    Other drug interactions: Brivaracetam (Briviact); Caffeine; Carbamazepine (Tegretol); Citalopram (Celexa); Clobazam (Onfi); Eslicarbazepine (Aptiom); Everolimus (Zostress); Lithium; Methadone (Dolophine); Rufinamide (Banzel); Sedative medications (CNS depressants); Sirolimus (Rapamune); Stiripentol (Diacomit); Tacrolimus (Prograf); Tamoxifen ; Soltamox); Topiramate (Topamax); Valproate; Warfarin (Coumadin); Zonisamide. (Last update: 11/23/2022) ____________________________________________________________________________________________   ____________________________________________________________________________________________  Naloxone Nasal Spray  Why am I receiving this medication? [place] STOP ACT requires that all patients taking high dose opioids or at risk of opioids respiratory depression, be prescribed an opioid reversal agent, such as Naloxone (AKA: Narcan).  What is this medication? NALOXONE (nal OX one) treats opioid overdose, which causes slow or shallow breathing, severe drowsiness, or trouble staying awake. Call emergency services after using this medication. You may need additional treatment. Naloxone works by reversing the effects of opioids. It belongs to a group of medications called opioid blockers.  COMMON BRAND NAME(S): Kloxxado, Narcan  What should I tell my care team before  I take this medication? They need to know if you have any of these conditions: Heart disease Substance use disorder An unusual or allergic reaction to naloxone, other medications, foods, dyes, or preservatives Pregnant or trying to get pregnant Breast-feeding  When to use this medication? This medication is to be used for the treatment of respiratory depression (less than 8 breaths per minute) secondary to opioid overdose.   How to use this medication? This medication is for use in the nose. Lay the person on their back. Support their neck with your hand and allow the head to tilt back before giving the medication. The nasal spray should be given into 1 nostril. After giving the medication, move the person onto their side. Do not remove or test the nasal spray until ready to use. Get emergency medical help right away after giving the first dose of this medication, even if the person wakes up. You should be familiar with how to recognize the signs and symptoms of a narcotic overdose. If more doses are needed, give the additional dose in the other nostril. Talk to your care team about the use of this medication in children. While this medication may be prescribed for children as young as newborns for selected conditions, precautions do apply.  Naloxone Overdosage: If you think you have taken too much of this medicine contact a poison control center or emergency room at once.  NOTE: This medicine is only for you. Do not share this medicine with others.  What if I miss a dose? This does not apply.  What may interact with this medication? This is only used during an emergency. No interactions are expected during emergency use. This list may not describe all possible interactions. Give your health care provider a list of all the medicines, herbs, non-prescription drugs, or dietary supplements you use. Also tell them if you smoke, drink alcohol, or use illegal drugs. Some items may interact with  your medicine.  What should I watch for while using this medication? Keep this medication ready for use in the case of an opioid overdose. Make sure that you have the phone number of your care team and local hospital ready. You may need to have additional doses of this medication. Each nasal spray contains a single dose. Some emergencies may require additional doses. After use, bring the treated person to the nearest hospital or call 911. Make sure the treating care team knows that the person has received a dose of this medication. You will receive additional instructions on what to do during and after use of this   medication before an emergency occurs.  What side effects may I notice from receiving this medication? Side effects that you should report to your care team as soon as possible: Allergic reactions--skin rash, itching, hives, swelling of the face, lips, tongue, or throat Side effects that usually do not require medical attention (report these to your care team if they continue or are bothersome): Constipation Dryness or irritation inside the nose Headache Increase in blood pressure Muscle spasms Stuffy nose Toothache This list may not describe all possible side effects. Call your doctor for medical advice about side effects. You may report side effects to FDA at 1-800-FDA-1088.  Where should I keep my medication? Because this is an emergency medication, you should keep it with you at all times.  Keep out of the reach of children and pets. Store between 20 and 25 degrees C (68 and 77 degrees F). Do not freeze. Throw away any unused medication after the expiration date. Keep in original box until ready to use.  NOTE: This sheet is a summary. It may not cover all possible information. If you have questions about this medicine, talk to your doctor, pharmacist, or health care provider.   2023 Elsevier/Gold Standard (2021-08-08  00:00:00)  ____________________________________________________________________________________________   

## 2023-04-21 ENCOUNTER — Encounter: Payer: Self-pay | Admitting: Pain Medicine

## 2023-04-21 ENCOUNTER — Ambulatory Visit: Payer: Medicare Other | Attending: Pain Medicine | Admitting: Pain Medicine

## 2023-04-21 VITALS — BP 112/83 | HR 89 | Temp 96.4°F | Resp 18 | Ht 74.0 in | Wt 305.0 lb

## 2023-04-21 DIAGNOSIS — M549 Dorsalgia, unspecified: Secondary | ICD-10-CM | POA: Diagnosis present

## 2023-04-21 DIAGNOSIS — Z79899 Other long term (current) drug therapy: Secondary | ICD-10-CM

## 2023-04-21 DIAGNOSIS — M25512 Pain in left shoulder: Secondary | ICD-10-CM | POA: Diagnosis present

## 2023-04-21 DIAGNOSIS — M25561 Pain in right knee: Secondary | ICD-10-CM | POA: Diagnosis present

## 2023-04-21 DIAGNOSIS — M545 Low back pain, unspecified: Secondary | ICD-10-CM | POA: Insufficient documentation

## 2023-04-21 DIAGNOSIS — M25551 Pain in right hip: Secondary | ICD-10-CM | POA: Diagnosis present

## 2023-04-21 DIAGNOSIS — M546 Pain in thoracic spine: Secondary | ICD-10-CM | POA: Diagnosis not present

## 2023-04-21 DIAGNOSIS — G8929 Other chronic pain: Secondary | ICD-10-CM

## 2023-04-21 DIAGNOSIS — G894 Chronic pain syndrome: Secondary | ICD-10-CM | POA: Diagnosis not present

## 2023-04-21 DIAGNOSIS — M533 Sacrococcygeal disorders, not elsewhere classified: Secondary | ICD-10-CM | POA: Insufficient documentation

## 2023-04-21 DIAGNOSIS — Z79891 Long term (current) use of opiate analgesic: Secondary | ICD-10-CM | POA: Diagnosis present

## 2023-04-21 DIAGNOSIS — M25511 Pain in right shoulder: Secondary | ICD-10-CM | POA: Diagnosis not present

## 2023-04-21 MED ORDER — OXYCODONE HCL 5 MG PO TABS
5.0000 mg | ORAL_TABLET | Freq: Four times a day (QID) | ORAL | 0 refills | Status: DC | PRN
Start: 2023-06-27 — End: 2023-07-20

## 2023-04-21 MED ORDER — OXYCODONE HCL 5 MG PO TABS
5.0000 mg | ORAL_TABLET | Freq: Four times a day (QID) | ORAL | 0 refills | Status: DC | PRN
Start: 2023-05-28 — End: 2023-07-20

## 2023-04-21 MED ORDER — OXYCODONE HCL 5 MG PO TABS
5.0000 mg | ORAL_TABLET | Freq: Four times a day (QID) | ORAL | 0 refills | Status: DC | PRN
Start: 2023-04-28 — End: 2023-07-20

## 2023-04-21 NOTE — Progress Notes (Signed)
Safety precautions to be maintained throughout the outpatient stay will include: orient to surroundings, keep bed in low position, maintain call bell within reach at all times, provide assistance with transfer out of bed and ambulation.   Nursing Pain Medication Assessment:  Safety precautions to be maintained throughout the outpatient stay will include: orient to surroundings, keep bed in low position, maintain call bell within reach at all times, provide assistance with transfer out of bed and ambulation.  Medication Inspection Compliance: Pill count conducted under aseptic conditions, in front of the patient. Neither the pills nor the bottle was removed from the patient's sight at any time. Once count was completed pills were immediately returned to the patient in their original bottle.  Medication: Oxycodone IR Pill/Patch Count:  27 of 120 pills remain Pill/Patch Appearance: Markings consistent with prescribed medication Bottle Appearance: Standard pharmacy container. Clearly labeled. Filled Date: 04 / 15 / 2024 Last Medication intake:  Today

## 2023-04-24 LAB — TOXASSURE SELECT 13 (MW), URINE

## 2023-07-02 ENCOUNTER — Encounter: Payer: Self-pay | Admitting: Family Medicine

## 2023-07-02 ENCOUNTER — Ambulatory Visit (INDEPENDENT_AMBULATORY_CARE_PROVIDER_SITE_OTHER): Payer: Medicare Other | Admitting: Family Medicine

## 2023-07-02 VITALS — BP 134/84 | HR 75 | Temp 97.4°F | Ht 74.0 in | Wt 275.5 lb

## 2023-07-02 DIAGNOSIS — E559 Vitamin D deficiency, unspecified: Secondary | ICD-10-CM

## 2023-07-02 DIAGNOSIS — E1169 Type 2 diabetes mellitus with other specified complication: Secondary | ICD-10-CM

## 2023-07-02 DIAGNOSIS — Z6835 Body mass index (BMI) 35.0-35.9, adult: Secondary | ICD-10-CM | POA: Diagnosis not present

## 2023-07-02 DIAGNOSIS — L84 Corns and callosities: Secondary | ICD-10-CM

## 2023-07-02 DIAGNOSIS — I10 Essential (primary) hypertension: Secondary | ICD-10-CM

## 2023-07-02 DIAGNOSIS — E538 Deficiency of other specified B group vitamins: Secondary | ICD-10-CM

## 2023-07-02 LAB — POCT GLYCOSYLATED HEMOGLOBIN (HGB A1C): Hemoglobin A1C: 5.4 % (ref 4.0–5.6)

## 2023-07-02 MED ORDER — LISINOPRIL 30 MG PO TABS: 30.0000 mg | ORAL_TABLET | Freq: Every day | ORAL | 2 refills | Status: DC

## 2023-07-02 MED ORDER — TRIAMCINOLONE ACETONIDE 0.1 % EX CREA: 1.0000 | TOPICAL_CREAM | Freq: Two times a day (BID) | CUTANEOUS | 0 refills | Status: DC

## 2023-07-02 NOTE — Addendum Note (Signed)
Addended by: Eustaquio Boyden on: 07/02/2023 09:49 AM   Modules accepted: Orders

## 2023-07-02 NOTE — Progress Notes (Addendum)
Ph: [phone] Fax: [phone]   Patient ID: Larry Goodwin, male    DOB: 1976/02/27, 47 y.o.   MRN: [id number]  This visit was conducted in person.  BP 134/84   Pulse 75   Temp (!) 97.4 F (36.3 C) (Temporal)   Ht 6\' 2"  (1.88 m)   Wt 275 lb 8 oz (125 kg)   SpO2 98%   BMI 35.37 kg/m   Orthostatic VS for the past 72 hrs (Last 3 readings):  Orthostatic BP Patient Position  07/02/23 0934 120/80 Standing  07/02/23 0931 134/86 Supine   CC: DM f/u visit  Subjective:   HPI: Larry Goodwin is a 47 y.o. male presenting on 07/02/2023 for Medical Management of Chronic Issues (Here for 3 mo DM f/u. )   Lives in [place].   40 lb weight loss in the past 3 months! Has cut out sugar and breads. Just drinking water Following carnivore diet and IF - meat eggs and butter. Eating 2pm-8pm.  24 hour recall: Fasts for breakfast 2pm lunch - 2 hamburger patties, water 8pm dinner - ribeye steak and 3 scrambled eggs, water   Has been trying to walk regularly but has developed painful hard surfaces.   Normal bowel movements.  HTN - continues lisinopril 40mg  nightly.  Notes some dizziness with standing after prolonged period bent over or sitting down.   DM - does not regularly check sugars - last checked 1 month ago and doesn't remember. Has previously declined medications, instead preferring to work on diet and weight loss. He does not have medicare part D. Denies low sugars or hypoglycemic symptoms. Denies paresthesias, blurry vision. Last diabetic eye exam DUE. Glucometer brand: using wife's monitor, states his was not covered. Last foot exam: DUE. DSME: previously declined. Lab Results  Component Value Date   HGBA1C 5.4 07/02/2023   Diabetic Foot Exam - Simple   Simple Foot Form Diabetic Foot exam was performed with the following findings: Yes 07/02/2023  9:04 AM  Visual Inspection See comments: Yes Sensation Testing Intact to touch and monofilament testing  bilaterally: Yes Pulse Check Posterior Tibialis and Dorsalis pulse intact bilaterally: Yes Comments Left lateral sole with tender thickened skin corn vs plantar wart    Lab Results  Component Value Date   MICROALBUR <0.7 03/31/2023         Relevant past medical, surgical, family and social history reviewed and updated as indicated. Interim medical history since our last visit reviewed. Allergies and medications reviewed and updated. Outpatient Medications Prior to Visit  Medication Sig Dispense Refill   Cholecalciferol (VITAMIN D3) 125 MCG (5000 UT) CAPS Take 1 capsule (5,000 Units total) by mouth in the morning and at bedtime.     Magnesium Oxide 500 MG CAPS Take 1 capsule (500 mg total) by mouth daily. 30 capsule 2   nystatin cream (MYCOSTATIN) APPLY AS DIRECTED 2 TIMES DAILY 30 g 0   oxyCODONE (OXY IR/ROXICODONE) 5 MG immediate release tablet Take 1 tablet (5 mg total) by mouth every 6 (six) hours as needed for severe pain. Must last 30 days 120 tablet 0   oxyCODONE (OXY IR/ROXICODONE) 5 MG immediate release tablet Take 1 tablet (5 mg total) by mouth every 6 (six) hours as needed for severe pain. Must last 30 days 120 tablet 0   oxyCODONE (OXY IR/ROXICODONE) 5 MG immediate release tablet Take 1 tablet (5 mg total) by mouth every 6 (six) hours as needed for severe pain. Must last 30 days  120 tablet 0   sildenafil (VIAGRA) 100 MG tablet Take 0.5-1 tablets (50-100 mg total) by mouth daily as needed for erectile dysfunction. 5 tablet 3   vitamin B-12 (CYANOCOBALAMIN) 1000 MCG tablet Take 1,000 mcg by mouth daily.     lisinopril (ZESTRIL) 40 MG tablet Take 1 tablet (40 mg total) by mouth daily. 90 tablet 4   oxyCODONE (OXY IR/ROXICODONE) 5 MG immediate release tablet Take 1 tablet (5 mg total) by mouth every 6 (six) hours as needed for severe pain. Must last 30 days 120 tablet 0   Blood Glucose Monitoring Suppl DEVI 1 each by Does not apply route in the morning, at noon, and at bedtime. May  substitute to any manufacturer covered by patient's insurance. 1 each 0   No facility-administered medications prior to visit.     Per HPI unless specifically indicated in ROS section below Review of Systems  Objective:  BP 134/84   Pulse 75   Temp (!) 97.4 F (36.3 C) (Temporal)   Ht 6\' 2"  (1.88 m)   Wt 275 lb 8 oz (125 kg)   SpO2 98%   BMI 35.37 kg/m   Wt Readings from Last 3 Encounters:  07/02/23 275 lb 8 oz (125 kg)  04/21/23 (!) 305 lb (138.3 kg)  03/31/23 (!) 304 lb 3 oz (138 kg)      Physical Exam Vitals and nursing note reviewed.  Constitutional:      Appearance: Normal appearance. He is not ill-appearing.  Eyes:     Extraocular Movements: Extraocular movements intact.     Conjunctiva/sclera: Conjunctivae normal.     Pupils: Pupils are equal, round, and reactive to light.  Cardiovascular:     Rate and Rhythm: Normal rate and regular rhythm.     Pulses: Normal pulses.     Heart sounds: Normal heart sounds. No murmur heard. Pulmonary:     Effort: Pulmonary effort is normal. No respiratory distress.     Breath sounds: Normal breath sounds. No wheezing, rhonchi or rales.  Musculoskeletal:        General: Tenderness present.     Right lower leg: No edema.     Left lower leg: No edema.     Comments:  See HPI for foot exam if done Some flat footedness bilaterally  Skin:    General: Skin is warm and dry.     Findings: Lesion present. No rash.     Comments: 4 areas of tender thickened hyperkeratotic skin to left lateral sole  Neurological:     Mental Status: He is alert.  Psychiatric:        Mood and Affect: Mood normal.        Behavior: Behavior normal.    Corn skin paring: IC obtained and in chart.  Area cleaned with alcohol pad.  4 presumed corns pared down with 15 scalpel. Pt tolerated procedure well  Aftercare instructions provided    Results for orders placed or performed in visit on 07/02/23  POCT glycosylated hemoglobin (Hb A1C)  Result Value  Ref Range   Hemoglobin A1C 5.4 4.0 - 5.6 %   HbA1c POC (<> result, manual entry)     HbA1c, POC (prediabetic range)     HbA1c, POC (controlled diabetic range)     Lab Results  Component Value Date   NA 134 (L) 03/31/2023   CL 97 03/31/2023   K 4.3 03/31/2023   CO2 28 03/31/2023   BUN 13 03/31/2023   CREATININE 0.80 03/31/2023  GFR 105.35 03/31/2023   CALCIUM 9.0 03/31/2023   ALBUMIN 4.4 03/31/2023   GLUCOSE 330 (H) 03/31/2023   Lab Results  Component Value Date   ALT 13 03/31/2023   AST 10 03/31/2023   ALKPHOS 65 03/31/2023   BILITOT 0.9 03/31/2023    Lab Results  Component Value Date   CHOL 195 03/31/2023   HDL 43.80 03/31/2023   LDLCALC 118 (H) 03/31/2023   TRIG 162.0 (H) 03/31/2023   CHOLHDL 4 03/31/2023    Lab Results  Component Value Date   VITAMINB12 873 03/31/2023   Lab Results  Component Value Date   25OHVITD2 <1.0 02/06/2019   25OHVITD3 34 02/06/2019   VD25OH 47.39 03/31/2023   Assessment & Plan:   Problem List Items Addressed This Visit     HTN (hypertension)    Chronic, stable. Describes orthostatic dizziness, SBP does drop 14 points.  Will trial lower lisinopril 30mg  dose.  I did ask him to monitor BP at home and let us know if running >140/90.      Relevant Medications   lisinopril (ZESTRIL) 30 MG tablet   Severe obesity (BMI 35.0-39.9) with comorbidity (HCC)    Congratulated on 40 lb weight loss with diet changes.  Discussed long term concern with carnivore diet.  Obesity complicated by comorbidities of hypertension, hyperlipidemia, arthritis.       Type 2 diabetes mellitus with other specified complication (HCC) - Primary    Chronic, marked improvement after 40 lb weight loss in the past 3 months through healthy diet and lifestyle changes.  A1c is now 5.4% - normal range! Congratulated - he is motivated to continue healthy change.  Update labs on new diet      Relevant Medications   lisinopril (ZESTRIL) 30 MG tablet   Other  Relevant Orders   POCT glycosylated hemoglobin (Hb A1C) (Completed)   Lipid panel   Comprehensive metabolic panel   Vitamin D deficiency   Relevant Orders   VITAMIN D 25 Hydroxy (Vit-D Deficiency, Fractures)   Vitamin B12 deficiency   Relevant Orders   Vitamin B12   Corns and callus    To L lateral sole. Paring of skin with 15 scalpel blade performed.  Home care instructions provided. Discussed supportive shoewear        Meds ordered this encounter  Medications   triamcinolone cream (KENALOG) 0.1 %    Sig: Apply 1 Application topically 2 (two) times daily.    Dispense:  30 g    Refill:  0   lisinopril (ZESTRIL) 30 MG tablet    Sig: Take 1 tablet (30 mg total) by mouth daily.    Dispense:  90 tablet    Refill:  2    Note new dose    Orders Placed This Encounter  Procedures   Lipid panel   Comprehensive metabolic panel   Vitamin B12   VITAMIN D 25 Hydroxy (Vit-D Deficiency, Fractures)   POCT glycosylated hemoglobin (Hb A1C)    Patient Instructions  Check with medicare insurance part B about coverage for glucometer and let me know what you find out (pharmacy or glucometer).  Congratulations on weight loss and sugar control!  Labs today  Take triamcinolone cream as needed to right thigh bug bite for no more than 1 week.  Left foot corns pared down today  Use epsom salt soaks and pumice stone followed by callus pads as needed.  Use supportive shoe wear. Return as needed or in 9 months for next physical.  Follow up plan: Return in about 9 months (around 04/01/2024) for medicare wellness visit.  Eustaquio Boyden, MD

## 2023-07-02 NOTE — Assessment & Plan Note (Signed)
To L lateral sole. Paring of skin with 15 scalpel blade performed.  Home care instructions provided. Discussed supportive shoewear

## 2023-07-02 NOTE — Assessment & Plan Note (Signed)
Congratulated on 40 lb weight loss with diet changes.  Discussed long term concern with carnivore diet.  Obesity complicated by comorbidities of hypertension, hyperlipidemia, arthritis.

## 2023-07-02 NOTE — Assessment & Plan Note (Signed)
Chronic, marked improvement after 40 lb weight loss in the past 3 months through healthy diet and lifestyle changes.  A1c is now 5.4% - normal range! Congratulated - he is motivated to continue healthy change.  Update labs on new diet

## 2023-07-02 NOTE — Assessment & Plan Note (Addendum)
Chronic, stable. Describes orthostatic dizziness, SBP does drop 14 points.  Will trial lower lisinopril 30mg  dose.  I did ask him to monitor BP at home and let us know if running >140/90.

## 2023-07-02 NOTE — Patient Instructions (Addendum)
Check with medicare insurance part B about coverage for glucometer and let me know what you find out (pharmacy or glucometer).  Congratulations on weight loss and sugar control!  Labs today  Take triamcinolone cream as needed to right thigh bug bite for no more than 1 week.  Left foot corns pared down today  Use epsom salt soaks and pumice stone followed by callus pads as needed.  Use supportive shoe wear. Return as needed or in 9 months for next physical.

## 2023-07-03 LAB — COMPREHENSIVE METABOLIC PANEL
AG Ratio: 1.5 (calc) (ref 1.0–2.5)
ALT: 10 U/L (ref 9–46)
AST: 12 U/L (ref 10–40)
Albumin: 4.2 g/dL (ref 3.6–5.1)
Alkaline phosphatase (APISO): 63 U/L (ref 36–130)
BUN: 16 mg/dL (ref 7–25)
CO2: 28 mmol/L (ref 20–32)
Calcium: 9.3 mg/dL (ref 8.6–10.3)
Chloride: 99 mmol/L (ref 98–110)
Creat: 0.86 mg/dL (ref 0.60–1.29)
Globulin: 2.8 g/dL (calc) (ref 1.9–3.7)
Glucose, Bld: 100 mg/dL — ABNORMAL HIGH (ref 65–99)
Potassium: 5 mmol/L (ref 3.5–5.3)
Sodium: 136 mmol/L (ref 135–146)
Total Bilirubin: 0.9 mg/dL (ref 0.2–1.2)
Total Protein: 7 g/dL (ref 6.1–8.1)

## 2023-07-03 LAB — LIPID PANEL
Cholesterol: 198 mg/dL (ref <200)
HDL: 34 mg/dL — ABNORMAL LOW (ref >=40)
LDL Cholesterol (Calc): 144 mg/dL (calc) — ABNORMAL HIGH
Non-HDL Cholesterol (Calc): 164 mg/dL (calc) — ABNORMAL HIGH (ref <130)
Total CHOL/HDL Ratio: 5.8 (calc) — ABNORMAL HIGH (ref <5.0)
Triglycerides: 93 mg/dL (ref <150)

## 2023-07-03 LAB — VITAMIN D 25 HYDROXY (VIT D DEFICIENCY, FRACTURES): Vit D, 25-Hydroxy: 74 ng/mL (ref 30–100)

## 2023-07-03 LAB — VITAMIN B12: Vitamin B-12: 970 pg/mL (ref 200–1100)

## 2023-07-12 ENCOUNTER — Telehealth: Payer: Self-pay | Admitting: Family Medicine

## 2023-07-12 ENCOUNTER — Encounter: Payer: Self-pay | Admitting: Family Medicine

## 2023-07-12 NOTE — Telephone Encounter (Signed)
Patient called in stating he had a follow up question regarding his blood pressure medication, patient asked for a call back whenever possible. Please advise patient's mobile number, thank you.

## 2023-07-12 NOTE — Addendum Note (Signed)
Addended by: Eustaquio Boyden on: 07/12/2023 05:01 PM   Modules accepted: Orders

## 2023-07-12 NOTE — Telephone Encounter (Signed)
Spoke with patient.  Last visit we changed lisinopril from 40mg  to 30mg  - this has helped orthostatic dizziness however he notes ongoing low BP readings - 96 systolic standing, 99/70.  Advised stop BP med, monitor at home and let us know if starting to run too high - he may restart 1/2 tablet 15mg .  He will let us know how BPs run.

## 2023-07-12 NOTE — Telephone Encounter (Signed)
I spoke with patient - see phone note.

## 2023-07-12 NOTE — Telephone Encounter (Signed)
Spoke with pt to get details about BP meds. States there were a few things he and Dr. Reece Agar discussed during recent OV and he was hoping Dr. Reece Agar could call him back. I explained Dr. Reece Agar is currently seeing pts but I will relay his message. Pt expresses his thanks.

## 2023-07-20 NOTE — Patient Instructions (Signed)
____________________________________________________________________________________________  Opioid Pain Medication Update  To: All patients taking opioid pain medications. (I.e.: hydrocodone, hydromorphone, oxycodone, oxymorphone, morphine, codeine, methadone, tapentadol, tramadol, buprenorphine, fentanyl, etc.)  Re: Updated review of side effects and adverse reactions of opioid analgesics, as well as new information about long term effects of this class of medications.  Direct risks of long-term opioid therapy are not limited to opioid addiction and overdose. Potential medical risks include serious fractures, breathing problems during sleep, hyperalgesia, immunosuppression, chronic constipation, bowel obstruction, myocardial infarction, and tooth decay secondary to xerostomia.  Unpredictable adverse effects that can occur even if you take your medication correctly: Cognitive impairment, respiratory depression, and death. Most people think that if they take their medication "correctly", and "as instructed", that they will be safe. Nothing could be farther from the truth. In reality, a significant amount of recorded deaths associated with the use of opioids has occurred in individuals that had taken the medication for a long time, and were taking their medication correctly. The following are examples of how this can happen: Patient taking his/her medication for a long time, as instructed, without any side effects, is given a certain antibiotic or another unrelated medication, which in turn triggers a "Drug-to-drug interaction" leading to disorientation, cognitive impairment, impaired reflexes, respiratory depression or an untoward event leading to serious bodily harm or injury, including death.  Patient taking his/her medication for a long time, as instructed, without any side effects, develops an acute impairment of liver and/or kidney function. This will lead to a rapid inability of the body to  breakdown and eliminate their pain medication, which will result in effects similar to an "overdose", but with the same medicine and dose that they had always taken. This again may lead to disorientation, cognitive impairment, impaired reflexes, respiratory depression or an untoward event leading to serious bodily harm or injury, including death.  A similar problem will occur with patients as they grow older and their liver and kidney function begins to decrease as part of the aging process.  Background information: Historically, the original case for using long-term opioid therapy to treat chronic noncancer pain was based on safety assumptions that subsequent experience has called into question. In 1996, the American Pain Society and the American Academy of Pain Medicine issued a consensus statement supporting long-term opioid therapy. This statement acknowledged the dangers of opioid prescribing but concluded that the risk for addiction was low; respiratory depression induced by opioids was short-lived, occurred mainly in opioid-naive patients, and was antagonized by pain; tolerance was not a common problem; and efforts to control diversion should not constrain opioid prescribing. This has now proven to be wrong. Experience regarding the risks for opioid addiction, misuse, and overdose in community practice has failed to support these assumptions.  According to the Centers for Disease Control and Prevention, fatal overdoses involving opioid analgesics have increased sharply over the past decade. Currently, more than 96,700 people die from drug overdoses every year. Opioids are a factor in 7 out of every 10 overdose deaths. Deaths from drug overdose have surpassed motor vehicle accidents as the leading cause of death for individuals between the ages of 80 and 61.  Clinical data suggest that neuroendocrine dysfunction may be very common in both men and women, potentially causing hypogonadism, erectile  dysfunction, infertility, decreased libido, osteoporosis, and depression. Recent studies linked higher opioid dose to increased opioid-related mortality. Controlled observational studies reported that long-term opioid therapy may be associated with increased risk for cardiovascular events. Subsequent meta-analysis concluded  that the safety of long-term opioid therapy in elderly patients has not been proven.   Side Effects and adverse reactions: Common side effects: Drowsiness (sedation). Dizziness. Nausea and vomiting. Constipation. Physical dependence -- Dependence often manifests with withdrawal symptoms when opioids are discontinued or decreased. Tolerance -- As you take repeated doses of opioids, you require increased medication to experience the same effect of pain relief. Respiratory depression -- This can occur in healthy people, especially with higher doses. However, people with COPD, asthma or other lung conditions may be even more susceptible to fatal respiratory impairment.  Uncommon side effects: An increased sensitivity to feeling pain and extreme response to pain (hyperalgesia). Chronic use of opioids can lead to this. Delayed gastric emptying (the process by which the contents of your stomach are moved into your small intestine). Muscle rigidity. Immune system and hormonal dysfunction. Quick, involuntary muscle jerks (myoclonus). Arrhythmia. Itchy skin (pruritus). Dry mouth (xerostomia).  Long-term side effects: Chronic constipation. Sleep-disordered breathing (SDB). Increased risk of bone fractures. Hypothalamic-pituitary-adrenal dysregulation. Increased risk of overdose.  RISKS: Respiratory depression and death: Opioids increase the risk of respiratory depression and death.  Drug-to-drug interactions: Opioids are relatively contraindicated in combination with benzodiazepines, sleep inducers, and other central nervous system depressants. Other classes of medications  (i.e.: certain antibiotics and even over-the-counter medications) may also trigger or induce respiratory depression in some patients.  Medical conditions: Patients with pre-existing respiratory problems are at higher risk of respiratory failure and/or depression when in combination with opioid analgesics. Opioids are relatively contraindicated in some medical conditions such as central sleep apnea.   Fractures and Falls:  Opioids increase the risk and incidence of falls. This is of particular importance in elderly patients.  Endocrine System:  Long-term administration is associated with endocrine abnormalities (endocrinopathies). (Also known as Opioid-induced Endocrinopathy) Influences on both the hypothalamic-pituitary-adrenal axis?and the hypothalamic-pituitary-gonadal axis have been demonstrated with consequent hypogonadism and adrenal insufficiency in both sexes. Hypogonadism and decreased levels of dehydroepiandrosterone sulfate have been reported in men and women. Endocrine effects include: Amenorrhoea in women (abnormal absence of menstruation) Reduced libido in both sexes Decreased sexual function Erectile dysfunction in men Hypogonadisms (decreased testicular function with shrinkage of testicles) Infertility Depression and fatigue Loss of muscle mass Anxiety Depression Immune suppression Hyperalgesia Weight gain Anemia Osteoporosis Patients (particularly women of childbearing age) should avoid opioids. There is insufficient evidence to recommend routine monitoring of asymptomatic patients taking opioids in the long-term for hormonal deficiencies.  Immune System: Human studies have demonstrated that opioids have an immunomodulating effect. These effects are mediated via opioid receptors both on immune effector cells and in the central nervous system. Opioids have been demonstrated to have adverse effects on antimicrobial response and anti-tumour surveillance. Buprenorphine has  been demonstrated to have no impact on immune function.  Opioid Induced Hyperalgesia: Human studies have demonstrated that prolonged use of opioids can lead to a state of abnormal pain sensitivity, sometimes called opioid induced hyperalgesia (OIH). Opioid induced hyperalgesia is not usually seen in the absence of tolerance to opioid analgesia. Clinically, hyperalgesia may be diagnosed if the patient on long-term opioid therapy presents with increased pain. This might be qualitatively and anatomically distinct from pain related to disease progression or to breakthrough pain resulting from development of opioid tolerance. Pain associated with hyperalgesia tends to be more diffuse than the pre-existing pain and less defined in quality. Management of opioid induced hyperalgesia requires opioid dose reduction.  Cancer: Chronic opioid therapy has been associated with an increased risk of cancer  among noncancer patients with chronic pain. This association was more evident in chronic strong opioid users. Chronic opioid consumption causes significant pathological changes in the small intestine and colon. Epidemiological studies have found that there is a link between opium dependence and initiation of gastrointestinal cancers. Cancer is the second leading cause of death after cardiovascular disease. Chronic use of opioids can cause multiple conditions such as GERD, immunosuppression and renal damage as well as carcinogenic effects, which are associated with the incidence of cancers.   Mortality: Long-term opioid use has been associated with increased mortality among patients with chronic non-cancer pain (CNCP).  Prescription of long-acting opioids for chronic noncancer pain was associated with a significantly increased risk of all-cause mortality, including deaths from causes other than overdose.  Reference: Von Korff M, Kolodny A, Deyo RA, Chou R. Long-term opioid therapy reconsidered. Ann Intern Med. 2011  Sep 6;155(5):325-8. doi: 10.7326/[id number]. PMID: 64403474; PMCID: [id number]. Randon Goldsmith, Hayward RA, Dunn KM, [place] KP. Risk of adverse events in patients prescribed long-term opioids: A cohort study in the [place] [facility] Research Datalink. Eur J Pain. 2019 May;23(5):908-922. doi: 10.1002/ejp.1357. Epub 2019 Jan 31. PMID: 43329518. Colameco S, Coren JS, Ciervo CA. Continuous opioid treatment for chronic noncancer pain: a time for moderation in prescribing. Postgrad Med. 2009 Jul;121(4):61-6. doi: 10.3810/pgm.2009.07.2032. PMID: 84166063. William Hamburger RN, [place] SD, Blazina I, Cristopher Peru, Bougatsos C, Deyo RA. The effectiveness and risks of long-term opioid therapy for chronic pain: a systematic review for a [employer] of [employer] Pathways to [employer]. Ann Intern Med. 2015 Feb 17;162(4):276-86. doi: 10.7326/M14-2559. PMID: 01601093. Caryl Bis [facility], Makuc DM. NCHS Data Brief No. 22. [place]: Centers for Disease Control and Prevention; 2009. Sep, Increase in Fatal Poisonings Involving Opioid Analgesics in the [place], 1999-2006. Song IA, Choi HR, Oh TK. Long-term opioid use and mortality in patients with chronic non-cancer pain: Ten-year follow-up study in [place] from 2010 through 2019. EClinicalMedicine. 2022 Jul 18;51:101558. doi: 10.1016/j.eclinm.2022.235573. PMID: 22025427; PMCID: [id number]. Huser, W., Schubert, T., Vogelmann, T. et al. All-cause mortality in patients with long-term opioid therapy compared with non-opioid analgesics for chronic non-cancer pain: a database study. BMC Med 18, 162 (2020). http://lester.info/ Rashidian H, Karie Kirks, Malekzadeh R, Haghdoost AA. An Ecological Study of the Association between Opiate Use and Incidence of Cancers. Addict Health. 2016 Fall;8(4):252-260. PMID: 51761607; PMCID: [id number].  Our Goal: Our goal is to control your  pain with means other than the use of opioid pain medications.  Our Recommendation: Talk to your physician about coming off of these medications. We can assist you with the tapering down and stopping these medicines. Based on the new information, even if you cannot completely stop the medication, a decrease in the dose may be associated with a lesser risk. Ask for other means of controlling the pain. Decrease or eliminate those factors that significantly contribute to your pain such as smoking, obesity, and a diet heavily tilted towards "inflammatory" nutrients.  Last Updated: 06/21/2023   ____________________________________________________________________________________________     ____________________________________________________________________________________________  National Pain Medication Shortage  The [place] is experiencing worsening drug shortages. These have had a negative widespread effect on patient care and treatment. Not expected to improve any time soon. Predicted to last past 2029.   Drug shortage list (generic names) Oxycodone IR Oxycodone/APAP Oxymorphone IR Hydromorphone Hydrocodone/APAP Morphine  Where is the problem?  Manufacturing and supply level.  Will this shortage affect you?  Only if you  take any of the above pain medications.  How? You may be unable to fill your prescription.  Your pharmacist may offer a "partial fill" of your prescription. (Warning: Do not accept partial fills.) Prescriptions partially filled cannot be transferred to another pharmacy. Read our Medication Rules and Regulation. Depending on how much medicine you are dependent on, you may experience withdrawals when unable to get the medication.  Recommendations: Consider ending your dependence on opioid pain medications. Ask your pain specialist to assist you with the process. Consider switching to a medication currently not in shortage, such as Buprenorphine. Talk to your pain  specialist about this option. Consider decreasing your pain medication requirements by managing tolerance thru "Drug Holidays". This may help minimize withdrawals, should you run out of medicine. Control your pain thru the use of non-pharmacological interventional therapies.   Your prescriber: Prescribers cannot be blamed for shortages. Medication manufacturing and supply issues cannot be fixed by the prescriber.   NOTE: The prescriber is not responsible for supplying the medication, or solving supply issues. Work with your pharmacist to solve it. The patient is responsible for the decision to take or continue taking the medication and for identifying and securing a legal supply source. By law, supplying the medication is the job and responsibility of the pharmacy. The prescriber is responsible for the evaluation, monitoring, and prescribing of these medications.   Prescribers will NOT: Re-issue prescriptions that have been partially filled. Re-issue prescriptions already sent to a pharmacy.  Re-send prescriptions to a different pharmacy because yours did not have your medication. Ask pharmacist to order more medicine or transfer the prescription to another pharmacy. (Read below.)  New 2023 regulation: "August 14, 2022 Revised Regulation Allows DEA-Registered Pharmacies to Transfer Electronic Prescriptions at a Patient's Request DEA Headquarters Division - Public Information Office Patients now have the ability to request their electronic prescription be transferred to another pharmacy without having to go back to their practitioner to initiate the request. This revised regulation went into effect on Monday, August 10, 2022.     At a patient's request, a DEA-registered retail pharmacy can now transfer an electronic prescription for a controlled substance (schedules II-V) to another DEA-registered retail pharmacy. Prior to this change, patients would have to go through their practitioner to  cancel their prescription and have it re-issued to a different pharmacy. The process was taxing and time consuming for both patients and practitioners.    The Drug Enforcement Administration [facility]) published its intent to revise the process for transferring electronic prescriptions on November 01, 2020.  The final rule was published in the federal register on July 09, 2022 and went into effect 30 days later.  Under the final rule, a prescription can only be transferred once between pharmacies, and only if allowed under existing state or other applicable law. The prescription must remain in its electronic form; may not be altered in any way; and the transfer must be communicated directly between two licensed pharmacists. It's important to note, any authorized refills transfer with the original prescription, which means the entire prescription will be filled at the same pharmacy".  Reference: [email or web address].is [facility] website announcement)  [email or web address] [employer] of Justice)   [employer] / Vol. 88, No. 143 / Thursday, July 09, 2022 / Rules and Regulations DEPARTMENT OF JUSTICE  Drug Enforcement Administration  21 CFR Part 1306  [Docket No. [id number]]  RIN [id number] Transfer of Electronic Prescriptions for Schedules II-V Controlled Substances Between Pharmacies for Initial Filling  ____________________________________________________________________________________________  ____________________________________________________________________________________________  Transfer of Pain Medication between Pharmacies  Re: 2023 DEA Clarification on existing regulation  Published on DEA Website: August 14, 2022  Title: Revised Regulation Allows DEA-Registered Pharmacies to Electrical engineer Prescriptions at a Patient's  Request DEA Headquarters Division - [employer]  "Patients now have the ability to request their electronic prescription be transferred to another pharmacy without having to go back to their practitioner to initiate the request. This revised regulation went into effect on Monday, August 10, 2022.     At a patient's request, a DEA-registered retail pharmacy can now transfer an electronic prescription for a controlled substance (schedules II-V) to another DEA-registered retail pharmacy. Prior to this change, patients would have to go through their practitioner to cancel their prescription and have it re-issued to a different pharmacy. The process was taxing and time consuming for both patients and practitioners.    The Drug Enforcement Administration [facility]) published its intent to revise the process for transferring electronic prescriptions on November 01, 2020.  The final rule was published in the federal register on July 09, 2022 and went into effect 30 days later.  Under the final rule, a prescription can only be transferred once between pharmacies, and only if allowed under existing state or other applicable law. The prescription must remain in its electronic form; may not be altered in any way; and the transfer must be communicated directly between two licensed pharmacists. It's important to note, any authorized refills transfer with the original prescription, which means the entire prescription will be filled at the same pharmacy."    REFERENCES: 1. DEA website announcement [email or web address]  2. Department of Justice website  [email or web address]  3. DEPARTMENT OF JUSTICE Drug Enforcement Administration 21 CFR Part 1306 [Docket No. [id number]] RIN [id number] "Transfer of Electronic Prescriptions for Schedules II-V Controlled Substances  Between Pharmacies for Initial Filling"  ____________________________________________________________________________________________     _______________________________________________________________________  Medication Rules  Purpose: To inform patients, and their family members, of our medication rules and regulations.  Applies to: All patients receiving prescriptions from our practice (written or electronic).  Pharmacy of record: This is the pharmacy where your electronic prescriptions will be sent. Make sure we have the correct one.  Electronic prescriptions: In compliance with the [facility] Strengthen Opioid Misuse Prevention (STOP) Act of 2017 (Session Conni Elliot [phone]), effective December 14, 2018, all controlled substances must be electronically prescribed. Written prescriptions, faxing, or calling prescriptions to a pharmacy will no longer be done.  Prescription refills: These will be provided only during in-person appointments. No medications will be renewed without a "face-to-face" evaluation with your provider. Applies to all prescriptions.  NOTE: The following applies primarily to controlled substances (Opioid* Pain Medications).   Type of encounter (visit): For patients receiving controlled substances, face-to-face visits are required. (Not an option and not up to the patient.)  Patient's responsibilities: Pain Pills: Bring all pain pills to every appointment (except for procedure appointments). Pill Bottles: Bring pills in original pharmacy bottle. Bring bottle, even if empty. Always bring the bottle of the most recent fill.  Medication refills: You are responsible for knowing and keeping track of what medications you are taking and when is it that you will need a refill. The day before your appointment: write a list of all prescriptions that need to be refilled. The day of the appointment: give the list to the admitting nurse. Prescriptions will be written only  during appointments. No prescriptions will be written on procedure days. If you forget a  medication: it will not be "Called in", "Faxed", or "electronically sent". You will need to get another appointment to get these prescribed. No early refills. Do not call asking to have your prescription filled early. Partial  or short prescriptions: Occasionally your pharmacy may not have enough pills to fill your prescription.  NEVER ACCEPT a partial fill or a prescription that is short of the total amount of pills that you were prescribed.  With controlled substances the law allows 72 hours for the pharmacy to complete the prescription.  If the prescription is not completed within 72 hours, the pharmacist will require a new prescription to be written. This means that you will be short on your medicine and we WILL NOT send another prescription to complete your original prescription.  Instead, request the pharmacy to send a carrier to a nearby branch to get enough medication to provide you with your full prescription. Prescription Accuracy: You are responsible for carefully inspecting your prescriptions before leaving our office. Have the discharge nurse carefully go over each prescription with you, before taking them home. Make sure that your name is accurately spelled, that your address is correct. Check the name and dose of your medication to make sure it is accurate. Check the number of pills, and the written instructions to make sure they are clear and accurate. Make sure that you are given enough medication to last until your next medication refill appointment. Taking Medication: Take medication as prescribed. When it comes to controlled substances, taking less pills or less frequently than prescribed is permitted and encouraged. Never take more pills than instructed. Never take the medication more frequently than prescribed.  Inform other Doctors: Always inform, all of your healthcare providers, of all the  medications you take. Pain Medication from other Providers: You are not allowed to accept any additional pain medication from any other Doctor or Healthcare provider. There are two exceptions to this rule. (see below) In the event that you require additional pain medication, you are responsible for notifying us, as stated below. Cough Medicine: Often these contain an opioid, such as codeine or hydrocodone. Never accept or take cough medicine containing these opioids if you are already taking an opioid* medication. The combination may cause respiratory failure and death. Medication Agreement: You are responsible for carefully reading and following our Medication Agreement. This must be signed before receiving any prescriptions from our practice. Safely store a copy of your signed Agreement. Violations to the Agreement will result in no further prescriptions. (Additional copies of our Medication Agreement are available upon request.) Laws, Rules, & Regulations: All patients are expected to follow all [address] and [employer], [employer], Rules, [place] [employer]. Ignorance of the Laws does not constitute a valid excuse.  Illegal drugs and Controlled Substances: The use of illegal substances (including, but not limited to marijuana and its derivatives) and/or the illegal use of any controlled substances is strictly prohibited. Violation of this rule may result in the immediate and permanent discontinuation of any and all prescriptions being written by our practice. The use of any illegal substances is prohibited. Adopted CDC guidelines & recommendations: Target dosing levels will be at or below 60 MME/day. Use of benzodiazepines** is not recommended.  Exceptions: There are only two exceptions to the rule of not receiving pain medications from other Healthcare Providers. Exception #1 (Emergencies): In the event of an emergency (i.e.: accident requiring emergency care), you are allowed to receive additional pain  medication. However, you are responsible for: As soon as  you are able, call our office [phone], at any time of the day or night, and leave a message stating your name, the date and nature of the emergency, and the name and dose of the medication prescribed. In the event that your call is answered by a member of our staff, make sure to document and save the date, time, and the name of the person that took your information.  Exception #2 (Planned Surgery): In the event that you are scheduled by another doctor or dentist to have any type of surgery or procedure, you are allowed (for a period no longer than 30 days), to receive additional pain medication, for the acute post-op pain. However, in this case, you are responsible for picking up a copy of our "Post-op Pain Management for Surgeons" handout, and giving it to your surgeon or dentist. This document is available at our office, and does not require an appointment to obtain it. Simply go to our office during business hours (Monday-Thursday from 8:00 AM to 4:00 PM) (Friday 8:00 AM to 12:00 Noon) or if you have a scheduled appointment with [place], prior to your surgery, and ask for it by name. In addition, you are responsible for: calling our office [phone], at any time of the day or night, and leaving a message stating your name, name of your surgeon, type of surgery, and date of procedure or surgery. Failure to comply with your responsibilities may result in termination of therapy involving the controlled substances. Medication Agreement Violation. Following the above rules, including your responsibilities will help you in avoiding a Medication Agreement Violation ("Breaking your Pain Medication Contract").  Consequences:  Not following the above rules may result in permanent discontinuation of medication prescription therapy.  *Opioid medications include: morphine, codeine, oxycodone, oxymorphone, hydrocodone, hydromorphone, meperidine, tramadol,  tapentadol, buprenorphine, fentanyl, methadone. **Benzodiazepine medications include: diazepam (Valium), alprazolam (Xanax), clonazepam (Klonopine), lorazepam (Ativan), clorazepate (Tranxene), chlordiazepoxide (Librium), estazolam (Prosom), oxazepam (Serax), temazepam (Restoril), triazolam (Halcion) (Last updated: 10/06/2022) ______________________________________________________________________    ______________________________________________________________________  Medication Recommendations and Reminders  Applies to: All patients receiving prescriptions (written and/or electronic).  Medication Rules & Regulations: You are responsible for reading, knowing, and following our "Medication Rules" document. These exist for your safety and that of others. They are not flexible and neither are we. Dismissing or ignoring them is an act of "non-compliance" that may result in complete and irreversible termination of such medication therapy. For safety reasons, "non-compliance" will not be tolerated. As with the [place] fundamental legal principle of "ignorance of the law is no defense", we will accept no excuses for not having read and knowing the content of documents provided to you by our practice.  Pharmacy of record:  Definition: This is the pharmacy where your electronic prescriptions will be sent.  We do not endorse any particular pharmacy. It is up to you and your insurance to decide what pharmacy to use.  We do not restrict you in your choice of pharmacy. However, once we write for your prescriptions, we will NOT be re-sending more prescriptions to fix restricted supply problems created by your pharmacy, or your insurance.  The pharmacy listed in the electronic medical record should be the one where you want electronic prescriptions to be sent. If you choose to change pharmacy, simply notify our nursing staff. Changes will be made only during your regular appointments and not over the  phone.  Recommendations: Keep all of your pain medications in a safe place, under lock and key, even  if you live alone. We will NOT replace lost, stolen, or damaged medication. We do not accept "Police Reports" as proof of medications having been stolen. After you fill your prescription, take 1 week's worth of pills and put them away in a safe place. You should keep a separate, properly labeled bottle for this purpose. The remainder should be kept in the original bottle. Use this as your primary supply, until it runs out. Once it's gone, then you know that you have 1 week's worth of medicine, and it is time to come in for a prescription refill. If you do this correctly, it is unlikely that you will ever run out of medicine. To make sure that the above recommendation works, it is very important that you make sure your medication refill appointments are scheduled at least 1 week before you run out of medicine. To do this in an effective manner, make sure that you do not leave the office without scheduling your next medication management appointment. Always ask the nursing staff to show you in your prescription , when your medication will be running out. Then arrange for the receptionist to get you a return appointment, at least 7 days before you run out of medicine. Do not wait until you have 1 or 2 pills left, to come in. This is very poor planning and does not take into consideration that we may need to cancel appointments due to bad weather, sickness, or emergencies affecting our staff. DO NOT ACCEPT A "Partial Fill": If for any reason your pharmacy does not have enough pills/tablets to completely fill or refill your prescription, do not allow for a "partial fill". The law allows the pharmacy to complete that prescription within 72 hours, without requiring a new prescription. If they do not fill the rest of your prescription within those 72 hours, you will need a separate prescription to fill the remaining  amount, which we will NOT provide. If the reason for the partial fill is your insurance, you will need to talk to the pharmacist about payment alternatives for the remaining tablets, but again, DO NOT ACCEPT A PARTIAL FILL, unless you can trust your pharmacist to obtain the remainder of the pills within 72 hours.  Prescription refills and/or changes in medication(s):  Prescription refills, and/or changes in dose or medication, will be conducted only during scheduled medication management appointments. (Applies to both, written and electronic prescriptions.) No refills on procedure days. No medication will be changed or started on procedure days. No changes, adjustments, and/or refills will be conducted on a procedure day. Doing so will interfere with the diagnostic portion of the procedure. No phone refills. No medications will be "called into the pharmacy". No Fax refills. No weekend refills. No Holliday refills. No after hours refills.  Remember:  Business hours are:  Monday to Thursday 8:00 AM to 4:00 PM Provider's Schedule: Delano Metz, MD - Appointments are:  Medication management: Monday and Wednesday 8:00 AM to 4:00 PM Procedure day: Tuesday and Thursday 7:30 AM to 4:00 PM Edward Jolly, MD - Appointments are:  Medication management: Tuesday and Thursday 8:00 AM to 4:00 PM Procedure day: Monday and Wednesday 7:30 AM to 4:00 PM (Last update: 10/06/2022) ______________________________________________________________________   ____________________________________________________________________________________________  Naloxone Nasal Spray  Why am I receiving this medication? [place] STOP ACT requires that all patients taking high dose opioids or at risk of opioids respiratory depression, be prescribed an opioid reversal agent, such as Naloxone (AKA: Narcan).  What is this medication? NALOXONE (  nal OX one) treats opioid overdose, which causes slow or shallow breathing,  severe drowsiness, or trouble staying awake. Call emergency services after using this medication. You may need additional treatment. Naloxone works by reversing the effects of opioids. It belongs to a group of medications called opioid blockers.  COMMON BRAND NAME(S): Kloxxado, Narcan  What should I tell my care team before I take this medication? They need to know if you have any of these conditions: Heart disease Substance use disorder An unusual or allergic reaction to naloxone, other medications, foods, dyes, or preservatives Pregnant or trying to get pregnant Breast-feeding  When to use this medication? This medication is to be used for the treatment of respiratory depression (less than 8 breaths per minute) secondary to opioid overdose.   How to use this medication? This medication is for use in the nose. Lay the person on their back. Support their neck with your hand and allow the head to tilt back before giving the medication. The nasal spray should be given into 1 nostril. After giving the medication, move the person onto their side. Do not remove or test the nasal spray until ready to use. Get emergency medical help right away after giving the first dose of this medication, even if the person wakes up. You should be familiar with how to recognize the signs and symptoms of a narcotic overdose. If more doses are needed, give the additional dose in the other nostril. Talk to your care team about the use of this medication in children. While this medication may be prescribed for children as young as newborns for selected conditions, precautions do apply.  Naloxone Overdosage: If you think you have taken too much of this medicine contact a poison control center or emergency room at once.  NOTE: This medicine is only for you. Do not share this medicine with others.  What if I miss a dose? This does not apply.  What may interact with this medication? This is only used during an  emergency. No interactions are expected during emergency use. This list may not describe all possible interactions. Give your health care provider a list of all the medicines, herbs, non-prescription drugs, or dietary supplements you use. Also tell them if you smoke, drink alcohol, or use illegal drugs. Some items may interact with your medicine.  What should I watch for while using this medication? Keep this medication ready for use in the case of an opioid overdose. Make sure that you have the phone number of your care team and local hospital ready. You may need to have additional doses of this medication. Each nasal spray contains a single dose. Some emergencies may require additional doses. After use, bring the treated person to the nearest hospital or call 911. Make sure the treating care team knows that the person has received a dose of this medication. You will receive additional instructions on what to do during and after use of this medication before an emergency occurs.  What side effects may I notice from receiving this medication? Side effects that you should report to your care team as soon as possible: Allergic reactions--skin rash, itching, hives, swelling of the face, lips, tongue, or throat Side effects that usually do not require medical attention (report these to your care team if they continue or are bothersome): Constipation Dryness or irritation inside the nose Headache Increase in blood pressure Muscle spasms Stuffy nose Toothache This list may not describe all possible side effects. Call your doctor for  medical advice about side effects. You may report side effects to FDA at 1-800-FDA-1088.  Where should I keep my medication? Because this is an emergency medication, you should keep it with you at all times.  Keep out of the reach of children and pets. Store between 20 and 25 degrees C (68 and 77 degrees F). Do not freeze. Throw away any unused medication after the  expiration date. Keep in original box until ready to use.  NOTE: This sheet is a summary. It may not cover all possible information. If you have questions about this medicine, talk to your doctor, pharmacist, or health care provider.   2023 [employer]/Gold Standard (2021-08-08 00:00:00)  ____________________________________________________________________________________________

## 2023-07-20 NOTE — Progress Notes (Signed)
PROVIDER NOTE: Information contained herein reflects review and annotations entered in association with encounter. Interpretation of such information and data should be left to medically-trained personnel. Information provided to patient can be located elsewhere in the medical record under "Patient Instructions". Document created using STT-dictation technology, any transcriptional errors that may result from process are unintentional.    Patient: Larry Goodwin  Service Category: E/M  Provider: Oswaldo Done, MD  DOB: 11/10/1976  DOS: 07/21/2023  Referring Provider: Eustaquio Boyden, MD  MRN: [id number]  Specialty: Interventional Pain Management  PCP: Eustaquio Boyden, MD  Type: Established Patient  Setting: Ambulatory outpatient    Location: Office  Delivery: Face-to-face     HPI  Larry Goodwin, a 47 y.o. year old male, is here today because of his Chronic pain syndrome [G89.4]. Larry Goodwin primary complain today is Back Pain (All over back)  Pertinent problems: Larry Goodwin has Lumbar DDD (degenerative disc disease); Chronic pain syndrome; Myofascial pain syndrome; Lumbar facet syndrome (Bilateral) (R>L); Chronic upper back pain (3ry area of Pain); Lumbar spondylosis with radiculopathy; Epidural fibrosis; Failed back surgical syndrome; Chronic low back pain (1ry area of Pain) (Bilateral) (R>L); Chronic sacroiliac joint pain (Right); Abnormal MRI, lumbar spine;  Abnormal CT myelogram of the thoracic spine; Thoracic disc herniation; Bulge of lumbar disc without myelopathy; Vertebral body hemangioma; Cervical IVDD (intervertebral disc displacement); 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 Pain Assessment: Severity of Chronic pain is reported as a 4 /10. Location: Back Upper, Mid, Lower/radiates down right buttock. Onset: More than a month ago. Quality: Constant, Dull, Sharp. Timing: Constant. Modifying factor(s): meds, lay down. Vitals:  height is 6\' 2"  (1.88 m) and weight is 272 lb (123.4 kg). His temporal temperature is 97.9 F (36.6 C). His blood pressure is 123/88 and his pulse is 70. His oxygen saturation is 100%.  BMI: Estimated body mass index is 34.92 kg/m as calculated from the following:   Height as of this encounter: 6\' 2"  (1.88 m).   Weight as of this encounter: 272 lb (123.4 kg). Last encounter: 04/21/2023. Last procedure: None.  Reason for encounter: medication management.  The patient indicates doing well with the current medication regimen. No adverse reactions or side effects reported to the medications.  The patient came in today a little early because apparently his father is in hospice and they are not expecting him to live long.  The patient is doing well on the medications and still using the same pharmacy therefore today we will go ahead and proceed with his refill.  His last UDS was within normal limits with no unexpected findings.  PMP report was also within normal limits.  RTCB: 10/25/2023   Pharmacotherapy Assessment  Analgesic: Oxycodone IR 5 mg, 1 tab PO q 6 hrs (20 mg/day of oxycodone) MME/day: 30 mg/day.   Monitoring: [place] PMP: PDMP reviewed during this encounter.       Pharmacotherapy: No side-effects or adverse reactions reported. Compliance: No problems identified. Effectiveness: Clinically acceptable.  Florina Ou, RN  07/21/2023  8:12 AM  Sign when Signing Visit Nursing Pain Medication Assessment:  Safety  precautions to be maintained throughout the  outpatient stay will include: orient to surroundings, keep bed in low position, maintain call bell within reach at all times, provide assistance with transfer out of bed and ambulation.  Medication Inspection Compliance: Pill count conducted under aseptic conditions, in front of the patient. Neither the pills nor the bottle was removed from the patient's sight at any time. Once count was completed pills were immediately returned to the patient in their original bottle.  Medication: Oxycodone IR Pill/Patch Count:  23 of 120 pills remain Pill/Patch Appearance: Markings consistent with prescribed medication Bottle Appearance: Standard pharmacy container. Clearly labeled. Filled Date: 7 / 4 / 2024 Last Medication intake:  Today  Florina Ou, RN  07/21/2023  8:09 AM  Sign when Signing Visit Safety precautions to be maintained throughout the outpatient stay will include: orient to surroundings, keep bed in low position, maintain call bell within reach at all times, provide assistance with transfer out of bed and ambulation.     No results found for: "CBDTHCR" No results found for: "D8THCCBX" No results found for: "D9THCCBX"  UDS:  Summary  Date Value Ref Range Status  04/21/2023 Note  Final    Comment:    ==================================================================== [employer] [facility] (MW) ==================================================================== Test                             Result       Flag       Units  Drug Present and Declared for Prescription Verification   Oxycodone                      1247         EXPECTED   ng/mg creat   Oxymorphone                    779          EXPECTED   ng/mg creat   Noroxycodone                   1526         EXPECTED   ng/mg creat   Noroxymorphone                 358          EXPECTED   ng/mg creat    Sources of oxycodone are scheduled prescription medications.    Oxymorphone, noroxycodone, and noroxymorphone are expected     metabolites of oxycodone. Oxymorphone is also available as a    scheduled prescription medication.  ==================================================================== Test                      Result    Flag   Units      Ref Range   Creatinine              43               mg/dL      >=19 ==================================================================== Declared Medications:  The flagging and interpretation on this report are based on the  following declared medications.  Unexpected results may arise from  inaccuracies in the declared medications.   **Note: The testing scope of this panel includes these medications:   Oxycodone   **Note: The testing scope of this panel does not include the  following reported medications:   Lisinopril (Zestril)  Magnesium (Mag-Ox)  Nystatin (  Mycostatin)  Sildenafil (Viagra)  Vitamin B12  Vitamin D3 ==================================================================== For clinical consultation, please call [phone]. ====================================================================       ROS  Constitutional: Denies any fever or chills Gastrointestinal: No reported hemesis, hematochezia, vomiting, or acute GI distress Musculoskeletal: Denies any acute onset joint swelling, redness, loss of ROM, or weakness Neurological: No reported episodes of acute onset apraxia, aphasia, dysarthria, agnosia, amnesia, paralysis, loss of coordination, or loss of consciousness  Medication Review  Magnesium Oxide -Mg Supplement, Vitamin D3, cyanocobalamin, naloxone, nystatin cream, oxyCODONE, sildenafil, and triamcinolone cream  History Review  Allergy: Larry Goodwin is allergic to levaquin [levofloxacin in d5w]. Drug: Larry Goodwin  reports current drug use. Drug: Oxycodone. Alcohol:  reports no history of alcohol use. Tobacco:  reports that he quit smoking about 15 years ago. His smoking use included cigarettes. He has never used smokeless  tobacco. Social: Larry Goodwin  reports that he quit smoking about 15 years ago. His smoking use included cigarettes. He has never used smokeless tobacco. He reports current drug use. Drug: Oxycodone. He reports that he does not drink alcohol. 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 Surgical: Larry Goodwin  has a past surgical history that includes Back surgery (2012) and Cholecystectomy. Family: family history includes COPD in his mother; Hypertension in an other family member.  Laboratory Chemistry Profile   Renal Lab Results  Component Value Date   BUN 16 07/02/2023   CREATININE 0.86 07/02/2023   BCR SEE NOTE: 07/02/2023   GFR 105.35 03/31/2023   GFRAA 129 02/06/2019   GFRNONAA 112 02/06/2019    Hepatic Lab Results  Component Value Date   AST 12 07/02/2023   ALT 10 07/02/2023   ALBUMIN 4.4 03/31/2023   ALKPHOS 65 03/31/2023   LIPASE 153 09/21/2012    Electrolytes Lab Results  Component Value Date   NA 136 07/02/2023   K 5.0 07/02/2023   CL 99 07/02/2023   CALCIUM 9.3 07/02/2023   MG 1.8 04/22/2022    Bone Lab Results  Component Value Date   VD25OH 74 07/02/2023   25OHVITD1 35 02/06/2019   25OHVITD2 <1.0 02/06/2019   25OHVITD3 34 02/06/2019    Inflammation (CRP: Acute Phase) (ESR: Chronic Phase) Lab Results  Component Value Date   CRP 9 02/06/2019   ESRSEDRATE 6 02/06/2019         Note: Above Lab results reviewed.  Recent Imaging Review  DG Foot Complete Right CLINICAL DATA:  Jarring foot 3 days prior, no fall  EXAM: RIGHT FOOT COMPLETE - 3+ VIEW  COMPARISON:  Radiograph 03/24/2012  FINDINGS: No acute bony abnormality. Specifically, no fracture, subluxation, or dislocation. Mild degenerative changes are noted in the midfoot. Posterior calcaneal spur is present.  Trace ankle effusion. Soft tissues are otherwise unremarkable  IMPRESSION: No acute fracture or traumatic malalignment.  Mild degenerative changes in the midfoot.  Posterior calcaneal spur.  Trace ankle effusion.  Electronically Signed   By: Kreg Shropshire M.D.   On: 02/12/2020 19:03 Note: Reviewed        Physical Exam  General appearance: Well nourished, well developed, and well hydrated. In no apparent acute distress Mental status: Alert, oriented x 3 (person, place, & time)       Respiratory: No evidence of acute respiratory distress Eyes: PERLA Vitals: BP 123/88   Pulse 70   Temp 97.9 F (  36.6 C) (Temporal)   Ht 6\' 2"  (1.88 m)   Wt 272 lb (123.4 kg)   SpO2 100%   BMI 34.92 kg/m  BMI: Estimated body mass index is 34.92 kg/m as calculated from the following:   Height as of this encounter: 6\' 2"  (1.88 m).   Weight as of this encounter: 272 lb (123.4 kg). Ideal: Ideal body weight: 82.2 kg (181 lb 3.5 oz) Adjusted ideal body weight: 98.7 kg (217 lb 8.5 oz)  Assessment   Diagnosis Status  1. Chronic pain syndrome   2. Chronic low back pain (1ry area of Pain) (Bilateral) (R>L)   3. Chronic hip pain (2ry area of Pain) (Right)   4. Chronic upper back pain (3ry area of Pain)   5. Chronic shoulder pain (4th area of Pain) (Bilateral) (Right)   6. Chronic sacroiliac joint pain (Right)   7. Chronic knee pain (Right)   8. Pharmacologic therapy   9. Chronic use of opiate for therapeutic purpose   10. Encounter for medication management   11. Encounter for chronic pain management    Controlled Controlled Controlled   Updated Problems: Problem  Myofascial Pain Syndrome  Chronic Congestion of Paranasal Sinus    Plan of Care  Problem-specific:  No problem-specific Assessment & Plan notes found for this encounter.  Larry Goodwin has a current medication list which includes the following long-term medication(s): naloxone, [START ON 07/27/2023] oxycodone, [START ON  08/26/2023] oxycodone, [START ON 09/25/2023] oxycodone, sildenafil, and magnesium oxide -mg supplement.  Pharmacotherapy (Medications Ordered): Meds ordered this encounter  Medications   oxyCODONE (OXY IR/ROXICODONE) 5 MG immediate release tablet    Sig: Take 1 tablet (5 mg total) by mouth every 6 (six) hours as needed for severe pain. Must last 30 days    Dispense:  120 tablet    Refill:  0    DO NOT: delete (not duplicate); no partial-fill (will deny script to complete), no refill request (F/U required). DISPENSE: 1 day early if closed on fill date. WARN: No CNS-depressants within 8 hrs of med.   oxyCODONE (OXY IR/ROXICODONE) 5 MG immediate release tablet    Sig: Take 1 tablet (5 mg total) by mouth every 6 (six) hours as needed for severe pain. Must last 30 days    Dispense:  120 tablet    Refill:  0    DO NOT: delete (not duplicate); no partial-fill (will deny script to complete), no refill request (F/U required). DISPENSE: 1 day early if closed on fill date. WARN: No CNS-depressants within 8 hrs of med.   oxyCODONE (OXY IR/ROXICODONE) 5 MG immediate release tablet    Sig: Take 1 tablet (5 mg total) by mouth every 6 (six) hours as needed for severe pain. Must last 30 days    Dispense:  120 tablet    Refill:  0    DO NOT: delete (not duplicate); no partial-fill (will deny script to complete), no refill request (F/U required). DISPENSE: 1 day early if closed on fill date. WARN: No CNS-depressants within 8 hrs of med.   naloxone (NARCAN) nasal spray 4 mg/0.1 mL    Sig: Place 1 spray into the nose as needed for up to 365 doses (for opioid-induced respiratory depresssion). In case of emergency (overdose), spray once into each nostril. If no response within 3 minutes, repeat application and call 911.    Dispense:  1 each    Refill:  0    Instruct patient in proper use of device.  Orders:  No orders of the defined types were placed in this encounter.  Follow-up plan:   Return in about 3  months (around 10/25/2023) for Eval-day (M,W), (F2F), (MM).      Interventional Therapies  Risk  Complexity Considerations:   GERD  Hx Esophagitis  HTN  T2NIDDM  Nicotine dependence     Planned  Pending:      Under consideration:   None.   Completed:   None since joining practice in 02/06/2019   Therapeutic  Palliative (PRN) options:   None established   Pharmacotherapy  Nonopioids transfer 10/28/2020: Magnesium (OTC)       Recent Visits No visits were found meeting these conditions. Showing recent visits within past 90 days and meeting all other requirements Today's Visits Date Type Provider Dept  07/21/23 Office Visit Delano Metz, MD [facility]  Showing today's visits and meeting all other requirements Future Appointments No visits were found meeting these conditions. Showing future appointments within next 90 days and meeting all other requirements  I discussed the assessment and treatment plan with the patient. The patient was provided an opportunity to ask questions and all were answered. The patient agreed with the plan and demonstrated an understanding of the instructions.  Patient advised to call back or seek an in-person evaluation if the symptoms or condition worsens.  Duration of encounter: 30 minutes.  Total time on encounter, as per AMA guidelines included both the face-to-face and non-face-to-face time personally spent by the physician and/or other qualified health care professional(s) on the day of the encounter (includes time in activities that require the physician or other qualified health care professional and does not include time in activities normally performed by clinical staff). Physician's time may include the following activities when performed: Preparing to see the patient (e.g., pre-charting review of records, searching for previously ordered imaging, lab work, and nerve conduction tests) Review of prior analgesic  pharmacotherapies. Reviewing PMP Interpreting ordered tests (e.g., lab work, imaging, nerve conduction tests) Performing post-procedure evaluations, including interpretation of diagnostic procedures Obtaining and/or reviewing separately obtained history Performing a medically appropriate examination and/or evaluation Counseling and educating the patient/family/caregiver Ordering medications, tests, or procedures Referring and communicating with other health care professionals (when not separately reported) Documenting clinical information in the electronic or other health record Independently interpreting results (not separately reported) and communicating results to the patient/ family/caregiver Care coordination (not separately reported)  Note by: Oswaldo Done, MD Date: 07/21/2023; Time: 8:47 AM

## 2023-07-21 ENCOUNTER — Ambulatory Visit: Payer: Medicare Other | Attending: Pain Medicine | Admitting: Pain Medicine

## 2023-07-21 ENCOUNTER — Encounter: Payer: Self-pay | Admitting: Pain Medicine

## 2023-07-21 VITALS — BP 123/88 | HR 70 | Temp 97.9°F | Ht 74.0 in | Wt 272.0 lb

## 2023-07-21 DIAGNOSIS — G894 Chronic pain syndrome: Secondary | ICD-10-CM | POA: Diagnosis not present

## 2023-07-21 DIAGNOSIS — M25551 Pain in right hip: Secondary | ICD-10-CM | POA: Insufficient documentation

## 2023-07-21 DIAGNOSIS — M25511 Pain in right shoulder: Secondary | ICD-10-CM | POA: Insufficient documentation

## 2023-07-21 DIAGNOSIS — G8929 Other chronic pain: Secondary | ICD-10-CM

## 2023-07-21 DIAGNOSIS — M25512 Pain in left shoulder: Secondary | ICD-10-CM | POA: Diagnosis present

## 2023-07-21 DIAGNOSIS — Z79891 Long term (current) use of opiate analgesic: Secondary | ICD-10-CM

## 2023-07-21 DIAGNOSIS — Z79899 Other long term (current) drug therapy: Secondary | ICD-10-CM

## 2023-07-21 DIAGNOSIS — M545 Low back pain, unspecified: Secondary | ICD-10-CM | POA: Insufficient documentation

## 2023-07-21 DIAGNOSIS — M549 Dorsalgia, unspecified: Secondary | ICD-10-CM | POA: Diagnosis present

## 2023-07-21 DIAGNOSIS — M25561 Pain in right knee: Secondary | ICD-10-CM | POA: Diagnosis present

## 2023-07-21 DIAGNOSIS — M533 Sacrococcygeal disorders, not elsewhere classified: Secondary | ICD-10-CM | POA: Diagnosis present

## 2023-07-21 MED ORDER — OXYCODONE HCL 5 MG PO TABS
5.0000 mg | ORAL_TABLET | Freq: Four times a day (QID) | ORAL | 0 refills | Status: DC | PRN
Start: 2023-08-26 — End: 2023-10-07

## 2023-07-21 MED ORDER — OXYCODONE HCL 5 MG PO TABS
5.0000 mg | ORAL_TABLET | Freq: Four times a day (QID) | ORAL | 0 refills | Status: DC | PRN
Start: 2023-07-27 — End: 2023-10-07

## 2023-07-21 MED ORDER — NALOXONE HCL 4 MG/0.1ML NA LIQD
1.0000 | NASAL | 0 refills | Status: DC | PRN
Start: 2023-07-21 — End: 2024-07-14

## 2023-07-21 MED ORDER — OXYCODONE HCL 5 MG PO TABS: 5.0000 mg | ORAL_TABLET | Freq: Four times a day (QID) | ORAL | 0 refills | Status: DC | PRN

## 2023-07-21 NOTE — Progress Notes (Signed)
Nursing Pain Medication Assessment:  Safety precautions to be maintained throughout the outpatient stay will include: orient to surroundings, keep bed in low position, maintain call bell within reach at all times, provide assistance with transfer out of bed and ambulation.  Medication Inspection Compliance: Pill count conducted under aseptic conditions, in front of the patient. Neither the pills nor the bottle was removed from the patient's sight at any time. Once count was completed pills were immediately returned to the patient in their original bottle.  Medication: Oxycodone IR Pill/Patch Count:  23 of 120 pills remain Pill/Patch Appearance: Markings consistent with prescribed medication Bottle Appearance: Standard pharmacy container. Clearly labeled. Filled Date: 7 / 29 / 2024 Last Medication intake:  Today

## 2023-07-21 NOTE — Progress Notes (Signed)
Safety precautions to be maintained throughout the outpatient stay will include: orient to surroundings, keep bed in low position, maintain call bell within reach at all times, provide assistance with transfer out of bed and ambulation.  

## 2023-10-01 ENCOUNTER — Ambulatory Visit: Payer: Medicare Other | Admitting: Family Medicine

## 2023-10-07 NOTE — Patient Instructions (Signed)

## 2023-10-07 NOTE — Progress Notes (Signed)
PROVIDER NOTE: Information contained herein reflects review and annotations entered in association with encounter. Interpretation of such information and data should be left to medically-trained personnel. Information provided to patient can be located elsewhere in the medical record under "Patient Instructions". Document created using STT-dictation technology, any transcriptional errors that may result from process are unintentional.    Patient: Larry Goodwin  Service Category: E/M  Provider: Oswaldo Done, MD  DOB: 12-06-1976  DOS: 10/11/2023  Referring Provider: Eustaquio Boyden, MD  MRN: [id number]  Specialty: Interventional Pain Management  PCP: Eustaquio Boyden, MD  Type: Established Patient  Setting: Ambulatory outpatient    Location: Office  Delivery: Face-to-face     HPI  Larry Goodwin, a 47 y.o. year old male, is here today because of his No primary diagnosis found.. Larry Goodwin's primary complain today is Back Pain  Pertinent problems: Larry Goodwin has Lumbar DDD (degenerative disc disease); Chronic pain syndrome; Myofascial pain syndrome; Lumbar facet syndrome (Bilateral) (R>L); Chronic upper back pain (3ry area of Pain); Lumbar spondylosis with radiculopathy; Epidural fibrosis; Failed back surgical syndrome; Chronic low back pain (1ry area of Pain) (Bilateral) (R>L); Chronic sacroiliac joint pain (Right); Abnormal MRI, lumbar spine;  Abnormal CT myelogram of the thoracic spine; Thoracic disc herniation; Bulge of lumbar disc without myelopathy; Vertebral body hemangioma; Cervical IVDD (intervertebral disc displacement); 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 Pain Assessment: Severity of Chronic pain is reported as a 4 /10. Location: Back Lower/radiates into right buttock. Onset: More than a month ago. Quality: Constant, Aching. Timing: Constant. Modifying factor(s): rest. Vitals:  height is 6\' 2"  (1.88 m) and weight is 254 lb (115.2 kg). His temperature is 97.3 F (36.3 C) (abnormal). His blood pressure is 146/93 (abnormal) and his pulse is 64. His respiration is 18 and oxygen saturation is 100%.  BMI: Estimated body mass index is 32.61 kg/m as calculated from the following:   Height as of this encounter: 6\' 2"  (1.88 m).   Weight as of this encounter: 254 lb (115.2 kg). Last encounter: 07/21/2023. Last procedure: Visit date not found.  Reason for encounter: medication management.  The patient indicates doing well with the current medication regimen. No adverse reactions or side effects reported to the medications.   RTCB: 01/23/2024   Pharmacotherapy Assessment  Analgesic: Oxycodone IR 5 mg, 1 tab PO q 6 hrs (20 mg/day of oxycodone) MME/day: 30 mg/day.   Monitoring: [place] PMP: PDMP reviewed during this encounter.       Pharmacotherapy: No side-effects or adverse reactions reported. Compliance: No problems identified. Effectiveness: Clinically acceptable.  Valerie Salts, RN  10/11/2023  8:37 AM  Sign when Signing Visit Nursing Pain Medication Assessment:  Safety precautions to be maintained throughout the outpatient stay will include: orient to surroundings, keep bed in low position, maintain call bell within reach at all times, provide assistance with transfer out of bed and ambulation.  Medication Inspection Compliance: Pill count conducted under aseptic conditions, in front of the patient. Neither the pills nor the bottle was removed from the patient's sight at any  time. Once count was completed  pills were immediately returned to the patient in their original bottle.  Medication: Oxycodone IR Pill/Patch Count:  55 of 120 pills remain Pill/Patch Appearance: Markings consistent with prescribed medication Bottle Appearance: Standard pharmacy container. Clearly labeled. Filled Date: 26 / 12 / 2024 Last Medication intake:  Today    No results found for: "CBDTHCR" No results found for: "D8THCCBX" No results found for: "D9THCCBX"  UDS:  Summary  Date Value Ref Range Status  04/21/2023 Note  Final    Comment:    ==================================================================== [facility] (MW) ==================================================================== Test                             Result       Flag       Units  Drug Present and Declared for Prescription Verification   Oxycodone                      1247         EXPECTED   ng/mg creat   Oxymorphone                    779          EXPECTED   ng/mg creat   Noroxycodone                   1526         EXPECTED   ng/mg creat   Noroxymorphone                 358          EXPECTED   ng/mg creat    Sources of oxycodone are scheduled prescription medications.    Oxymorphone, noroxycodone, and noroxymorphone are expected    metabolites of oxycodone. Oxymorphone is also available as a    scheduled prescription medication.  ==================================================================== Test                      Result    Flag   Units      Ref Range   Creatinine              43               mg/dL      >=82 ==================================================================== Declared Medications:  The flagging and interpretation on this report are based on the  following declared medications.  Unexpected results may arise from  inaccuracies in the declared medications.   **Note: The testing scope of this panel includes these medications:   Oxycodone   **Note: The testing  scope of this panel does not include the  following reported medications:   Lisinopril (Zestril)  Magnesium (Mag-Ox)  Nystatin (Mycostatin)  Sildenafil (Viagra)  Vitamin B12  Vitamin D3 ==================================================================== For clinical consultation, please call [phone]. ====================================================================       ROS  Constitutional: Denies any fever or chills Gastrointestinal: No reported hemesis, hematochezia, vomiting, or acute GI distress Musculoskeletal: Denies any acute onset joint swelling, redness, loss of ROM, or weakness Neurological: No reported episodes of acute onset apraxia, aphasia, dysarthria, agnosia, amnesia, paralysis, loss of coordination, or loss of consciousness  Medication Review  Magnesium Oxide -Mg Supplement, Vitamin D3, cyanocobalamin, naloxone, nystatin cream, oxyCODONE, sildenafil, and triamcinolone cream  History Review  Allergy: Larry Goodwin is allergic to levaquin [levofloxacin in d5w]. Drug: Larry Goodwin  reports current drug use. Drug: Oxycodone. Alcohol:  reports  no history of alcohol use. Tobacco:  reports that he quit smoking about 15 years ago. His smoking use included cigarettes. He has never used smokeless tobacco. Social: Larry Goodwin  reports that he quit smoking about 15 years ago. His smoking use included cigarettes. He has never used smokeless tobacco. He reports current drug use. Drug: Oxycodone. He reports that he does not drink alcohol. 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 Surgical: Larry Goodwin  has a past surgical history that includes Back surgery (2012) and Cholecystectomy. Family: family history includes COPD in his mother; Hypertension in an other family  member.  Laboratory Chemistry Profile   Renal Lab Results  Component Value Date   BUN 16 07/02/2023   CREATININE 0.86 07/02/2023   BCR SEE NOTE: 07/02/2023   GFR 105.35 03/31/2023   GFRAA 129 02/06/2019   GFRNONAA 112 02/06/2019    Hepatic Lab Results  Component Value Date   AST 12 07/02/2023   ALT 10 07/02/2023   ALBUMIN 4.4 03/31/2023   ALKPHOS 65 03/31/2023   LIPASE 153 09/21/2012    Electrolytes Lab Results  Component Value Date   NA 136 07/02/2023   K 5.0 07/02/2023   CL 99 07/02/2023   CALCIUM 9.3 07/02/2023   MG 1.8 04/22/2022    Bone Lab Results  Component Value Date   VD25OH 74 07/02/2023   25OHVITD1 35 02/06/2019   25OHVITD2 <1.0 02/06/2019   25OHVITD3 34 02/06/2019    Inflammation (CRP: Acute Phase) (ESR: Chronic Phase) Lab Results  Component Value Date   CRP 9 02/06/2019   ESRSEDRATE 6 02/06/2019         Note: Above Lab results reviewed.  Recent Imaging Review  DG Foot Complete Right CLINICAL DATA:  Jarring foot 3 days prior, no fall  EXAM: RIGHT FOOT COMPLETE - 3+ VIEW  COMPARISON:  Radiograph 03/24/2012  FINDINGS: No acute bony abnormality. Specifically, no fracture, subluxation, or dislocation. Mild degenerative changes are noted in the midfoot. Posterior calcaneal spur is present. Trace ankle effusion. Soft tissues are otherwise unremarkable  IMPRESSION: No acute fracture or traumatic malalignment.  Mild degenerative changes in the midfoot.  Posterior calcaneal spur.  Trace ankle effusion.  Electronically Signed   By: Kreg Shropshire M.D.   On: 02/12/2020 19:03 Note: Reviewed        Physical Exam  General appearance: Well nourished, well developed, and well hydrated. In no apparent acute distress Mental status: Alert, oriented x 3 (person, place, & time)       Respiratory: No evidence of acute respiratory distress Eyes: PERLA Vitals: BP (!) 146/93   Pulse 64   Temp (!) 97.3 F (36.3 C)   Resp 18   Ht 6\' 2"  (1.88 m)    Wt 254 lb (115.2 kg)   SpO2 100%   BMI 32.61 kg/m  BMI: Estimated body mass index is 32.61 kg/m as calculated from the following:   Height as of this encounter: 6\' 2"  (1.88 m).   Weight as of this encounter: 254 lb (115.2 kg). Ideal: Ideal body weight: 82.2 kg (181 lb 3.5 oz) Adjusted ideal body weight: 95.4 kg (210 lb 5.3 oz)  Assessment   Diagnosis Status  1. Chronic low back pain (1ry area of Pain) (Bilateral) (R>L)   2. Chronic hip pain (2ry area of Pain) (Right)   3. Chronic upper back  pain (3ry area of Pain)   4. Chronic shoulder pain (4th area of Pain) (Bilateral) (Right)   5. Chronic sacroiliac joint pain (Right)   6. Chronic knee pain (Right)   7. Chronic pain syndrome   8. Pharmacologic therapy   9. Chronic use of opiate for therapeutic purpose   10. Encounter for medication management   11. Encounter for chronic pain management    Controlled Controlled Controlled   Updated Problems: No problems updated.  Plan of Care  Problem-specific:  No problem-specific Assessment & Plan notes found for this encounter.  Larry Goodwin has a current medication list which includes the following long-term medication(s): magnesium oxide -mg supplement, naloxone, [START ON 10/25/2023] oxycodone, [START ON 11/24/2023] oxycodone, [START ON 12/24/2023] oxycodone, and sildenafil.  Pharmacotherapy (Medications Ordered): Meds ordered this encounter  Medications   oxyCODONE (OXY IR/ROXICODONE) 5 MG immediate release tablet    Sig: Take 1 tablet (5 mg total) by mouth every 6 (six) hours as needed for severe pain (pain score 7-10). Must last 30 days    Dispense:  120 tablet    Refill:  0    DO NOT: delete (not duplicate); no partial-fill (will deny script to complete), no refill request (F/U required). DISPENSE: 1 day early if closed on fill date. WARN: No CNS-depressants within 8 hrs of med.   oxyCODONE (OXY IR/ROXICODONE) 5 MG immediate release tablet    Sig: Take 1 tablet (5 mg  total) by mouth every 6 (six) hours as needed for severe pain (pain score 7-10). Must last 30 days    Dispense:  120 tablet    Refill:  0    DO NOT: delete (not duplicate); no partial-fill (will deny script to complete), no refill request (F/U required). DISPENSE: 1 day early if closed on fill date. WARN: No CNS-depressants within 8 hrs of med.   oxyCODONE (OXY IR/ROXICODONE) 5 MG immediate release tablet    Sig: Take 1 tablet (5 mg total) by mouth every 6 (six) hours as needed for severe pain (pain score 7-10). Must last 30 days    Dispense:  120 tablet    Refill:  0    DO NOT: delete (not duplicate); no partial-fill (will deny script to complete), no refill request (F/U required). DISPENSE: 1 day early if closed on fill date. WARN: No CNS-depressants within 8 hrs of med.   Orders:  No orders of the defined types were placed in this encounter.  Follow-up plan:   Return in about 3 months (around 01/23/2024) for Eval-day (M,W), (F2F), (MM).      Interventional Therapies  Risk  Complexity Considerations:   GERD  Hx Esophagitis  HTN  T2NIDDM  Nicotine dependence     Planned  Pending:      Under consideration:   None.   Completed:   None since joining practice in 02/06/2019   Therapeutic  Palliative (PRN) options:   None established   Pharmacotherapy  Nonopioids transfer 10/28/2020: Magnesium (OTC)        Recent Visits Date Type Provider Dept  07/21/23 Office Visit Delano Metz, MD [facility]  Showing recent visits within past 90 days and meeting all other requirements Today's Visits Date Type Provider Dept  10/11/23 Office Visit Delano Metz, MD [facility]  Showing today's visits and meeting all other requirements Future Appointments No visits were found meeting these conditions. Showing future appointments within next 90 days and meeting all other requirements  I discussed the assessment  and treatment plan with the patient. The  patient was provided an opportunity to ask questions and all were answered. The patient agreed with the plan and demonstrated an understanding of the instructions.  Patient advised to call back or seek an in-person evaluation if the symptoms or condition worsens.  Duration of encounter: 30 minutes.  Total time on encounter, as per AMA guidelines included both the face-to-face and non-face-to-face time personally spent by the physician and/or other qualified health care professional(s) on the day of the encounter (includes time in activities that require the physician or other qualified health care professional and does not include time in activities normally performed by clinical staff). Physician's time may include the following activities when performed: Preparing to see the patient (e.g., pre-charting review of records, searching for previously ordered imaging, lab work, and nerve conduction tests) Review of prior analgesic pharmacotherapies. Reviewing PMP Interpreting ordered tests (e.g., lab work, imaging, nerve conduction tests) Performing post-procedure evaluations, including interpretation of diagnostic procedures Obtaining and/or reviewing separately obtained history Performing a medically appropriate examination and/or evaluation Counseling and educating the patient/family/caregiver Ordering medications, tests, or procedures Referring and communicating with other health care professionals (when not separately reported) Documenting clinical information in the electronic or other health record Independently interpreting results (not separately reported) and communicating results to the patient/ family/caregiver Care coordination (not separately reported)  Note by: Oswaldo Done, MD Date: 10/11/2023; Time: 8:43 AM

## 2023-10-11 ENCOUNTER — Encounter: Payer: Self-pay | Admitting: Pain Medicine

## 2023-10-11 ENCOUNTER — Ambulatory Visit: Payer: Medicare Other | Attending: Pain Medicine | Admitting: Pain Medicine

## 2023-10-11 DIAGNOSIS — G894 Chronic pain syndrome: Secondary | ICD-10-CM | POA: Diagnosis present

## 2023-10-11 DIAGNOSIS — G8929 Other chronic pain: Secondary | ICD-10-CM | POA: Diagnosis present

## 2023-10-11 DIAGNOSIS — M533 Sacrococcygeal disorders, not elsewhere classified: Secondary | ICD-10-CM | POA: Insufficient documentation

## 2023-10-11 DIAGNOSIS — M25512 Pain in left shoulder: Secondary | ICD-10-CM | POA: Diagnosis present

## 2023-10-11 DIAGNOSIS — M549 Dorsalgia, unspecified: Secondary | ICD-10-CM | POA: Insufficient documentation

## 2023-10-11 DIAGNOSIS — M545 Low back pain, unspecified: Secondary | ICD-10-CM | POA: Insufficient documentation

## 2023-10-11 DIAGNOSIS — Z79891 Long term (current) use of opiate analgesic: Secondary | ICD-10-CM | POA: Diagnosis present

## 2023-10-11 DIAGNOSIS — M25561 Pain in right knee: Secondary | ICD-10-CM | POA: Diagnosis present

## 2023-10-11 DIAGNOSIS — M25511 Pain in right shoulder: Secondary | ICD-10-CM | POA: Diagnosis not present

## 2023-10-11 DIAGNOSIS — M25551 Pain in right hip: Secondary | ICD-10-CM | POA: Diagnosis not present

## 2023-10-11 DIAGNOSIS — Z79899 Other long term (current) drug therapy: Secondary | ICD-10-CM | POA: Insufficient documentation

## 2023-10-11 MED ORDER — OXYCODONE HCL 5 MG PO TABS: 5.0000 mg | ORAL_TABLET | Freq: Four times a day (QID) | ORAL | 0 refills | Status: DC | PRN

## 2023-10-11 MED ORDER — OXYCODONE HCL 5 MG PO TABS
5.0000 mg | ORAL_TABLET | Freq: Four times a day (QID) | ORAL | 0 refills | Status: DC | PRN
Start: 2023-10-25 — End: 2024-01-18

## 2023-10-11 NOTE — Progress Notes (Signed)
Nursing Pain Medication Assessment:  Safety precautions to be maintained throughout the outpatient stay will include: orient to surroundings, keep bed in low position, maintain call bell within reach at all times, provide assistance with transfer out of bed and ambulation.  Medication Inspection Compliance: Pill count conducted under aseptic conditions, in front of the patient. Neither the pills nor the bottle was removed from the patient's sight at any time. Once count was completed pills were immediately returned to the patient in their original bottle.  Medication: Oxycodone IR Pill/Patch Count:  55 of 120 pills remain Pill/Patch Appearance: Markings consistent with prescribed medication Bottle Appearance: Standard pharmacy container. Clearly labeled. Filled Date: 24 / 12 / 2024 Last Medication intake:  Today

## 2024-01-18 NOTE — Progress Notes (Signed)
 PROVIDER NOTE: Information contained herein reflects review and annotations entered in association with encounter. Interpretation of such information and data should be left to medically-trained personnel. Information provided to patient can be located elsewhere in the medical record under Patient Instructions. Document created using STT-dictation technology, any transcriptional errors that may result from process are unintentional.    Patient: Larry Goodwin  Service Category: E/M  Provider: Eric DELENA Como, MD  DOB: 1976-05-10  DOS: 01/19/2024  Referring Provider: Rilla Baller, MD  MRN: [id number]  Specialty: Interventional Pain Management  PCP: Rilla Baller, MD  Type: Established Patient  Setting: Ambulatory outpatient    Location: Office  Delivery: Face-to-face     HPI  [name], a 48 y.o. year old male, is here today because of his Chronic pain syndrome [G89.4]. Larry Goodwin's primary complain today is Back Pain and Hip Pain (Right )  Pertinent problems: Larry Goodwin has Lumbar DDD (degenerative disc disease); Chronic pain syndrome; Myofascial pain syndrome; Lumbar facet syndrome (Bilateral) (R>L); Chronic upper back pain (3ry area of Pain); Lumbar spondylosis with radiculopathy; Epidural fibrosis; Failed back surgical syndrome; Chronic low back pain (1ry area of Pain) (Bilateral) (R>L); Chronic sacroiliac joint pain (Right); Abnormal MRI, lumbar spine;  Abnormal CT myelogram of the thoracic spine; Thoracic disc herniation; Bulge of lumbar disc without myelopathy; Vertebral body hemangioma; Cervical IVDD (intervertebral disc displacement); 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 Pain Assessment: Severity of Chronic pain is reported as a 4 /10. Location: Back Mid, Lower, Right, Left/Pain radaites from mid back to lower back to right hip. Onset: More than a month ago. Quality: Goodwin, Sharp, Aching. Timing: Goodwin. Modifying factor(s): Pain medicationa nd laying down. Vitals:  height is 6' 2 (1.88 m) and weight is 237 lb (107.5 kg). His temporal temperature is 97.2 F (36.2 C) (abnormal). His blood pressure is 131/90 (abnormal) and his pulse is 71. His respiration is 16 and oxygen saturation is 100%.  BMI: Estimated body mass index is 30.43 kg/m as calculated from the following:   Height as of this encounter: 6' 2 (1.88 m).   Weight as of this encounter: 237 lb (107.5 kg). Last encounter: 10/11/2023. Last procedure: Visit date not found.  Reason for encounter: medication management.  The patient indicates doing well with the current medication regimen. No adverse reactions or side effects reported to the medications.   Discussed the use of AI scribe software for clinical note transcription with the patient, who gave verbal consent to proceed.  History of Present Illness   Larry Goodwin is a 48 year old male who presents with chronic lower back pain radiating to the right hip.  He has experienced chronic lower back pain radiating to the right hip for approximately fifteen years. The pain is located across the lower back and extends down the right hip, significantly impacting his mobility and making it difficult to walk long distances. He sometimes uses a handicapped parking space due to the pain, although not consistently.  He denies any side effects or adverse reactions to his current medications.     RTCB: 04/22/2024  Pharmacotherapy Assessment   Analgesic: Oxycodone  IR 5 mg, 1 tab PO q 6 hrs (20 mg/day of oxycodone ) MME/day: 30 mg/day.   Monitoring: [place] PMP: PDMP reviewed during this encounter.       Pharmacotherapy: No side-effects or adverse reactions reported. Compliance: No problems identified. Effectiveness: Clinically acceptable.  Bonner Norris, RN  01/19/2024  8:48 AM  Sign when Signing Visit Nursing Pain Medication Assessment:  Safety precautions to be maintained throughout the outpatient stay will include: orient to surroundings, keep bed in low position, maintain call bell within reach at all times, provide assistance with transfer out of bed and ambulation.   Medication Inspection Compliance: Pill count conducted under aseptic conditions, in front of the patient. Neither the pills nor the bottle was removed from the patient's sight at any time. Once count was completed pills were immediately returned to the patient in their original bottle.  Medication: Oxycodone  IR Pill/Patch Count:  15 of 120 pills remain Pill/Patch Appearance: Markings consistent with prescribed medication Bottle Appearance: Standard pharmacy container. Clearly labeled. Filled Date: 01 / 10 / 2025 Last Medication intake:  Today    No results found for: CBDTHCR No results found for: D8THCCBX No results found for: D9THCCBX  UDS:  Summary  Date Value Ref Range Status  04/21/2023 Note  Final    Comment:    ==================================================================== [employer] [facility] (MW) ==================================================================== Test                             Result       Flag       Units  Drug Present and Declared for Prescription Verification   Oxycodone                       1247         EXPECTED   ng/mg creat   Oxymorphone                    779          EXPECTED   ng/mg creat   Noroxycodone                   1526         EXPECTED   ng/mg creat   Noroxymorphone                 358           EXPECTED   ng/mg creat    Sources of oxycodone  are scheduled prescription medications.    Oxymorphone, noroxycodone, and noroxymorphone are expected    metabolites of oxycodone . Oxymorphone is also available as a    scheduled prescription medication.  ==================================================================== Test                      Result    Flag   Units      Ref Range   Creatinine              43               mg/dL      >=79 ==================================================================== Declared Medications:  The flagging and interpretation on this report are based on the  following declared medications.  Unexpected results may arise from  inaccuracies in the declared medications.   **Note: The testing scope of this panel includes these medications:   Oxycodone    **Note: The testing scope  of this panel does not include the  following reported medications:   Lisinopril  (Zestril )  Magnesium  (Mag-Ox)  Nystatin  (Mycostatin )  Sildenafil  (Viagra )  Vitamin B12  Vitamin D3 ==================================================================== For clinical consultation, please call [phone]. ====================================================================       ROS  Constitutional: Denies any fever or chills Gastrointestinal: No reported hemesis, hematochezia, vomiting, or acute GI distress Musculoskeletal: Denies any acute onset joint swelling, redness, loss of ROM, or weakness Neurological: No reported episodes of acute onset apraxia, aphasia, dysarthria, agnosia, amnesia, paralysis, loss of coordination, or loss of consciousness  Medication Review  Magnesium  Oxide -Mg Supplement, Vitamin D3, cyanocobalamin , naloxone , nystatin  cream, oxyCODONE , sildenafil , and triamcinolone  cream  History Review  Allergy: Larry Goodwin is allergic to levaquin  [levofloxacin  in d5w]. Drug: Larry Goodwin  reports current drug use. Drug: Oxycodone . Alcohol:  reports no  history of alcohol use. Tobacco:  reports that he quit smoking about 16 years ago. His smoking use included cigarettes. He has never used smokeless tobacco. Social: Larry Goodwin  reports that he quit smoking about 16 years ago. His smoking use included cigarettes. He has never used smokeless tobacco. He reports current drug use. Drug: Oxycodone . He reports that he does not drink alcohol. 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 Surgical: Larry Goodwin  has a past surgical history that includes Back surgery (2012) and Cholecystectomy. Family: family history includes COPD in his mother; Hypertension in an other family member.  Laboratory Chemistry Profile   Renal Lab Results  Component Value Date   BUN 16 07/02/2023   CREATININE 0.86 07/02/2023   BCR SEE NOTE: 07/02/2023   GFR 105.35 03/31/2023   GFRAA 129 02/06/2019   GFRNONAA 112 02/06/2019    Hepatic Lab Results  Component Value Date   AST 12 07/02/2023   ALT 10 07/02/2023   ALBUMIN 4.4 03/31/2023   ALKPHOS 65 03/31/2023   LIPASE 153 09/21/2012    Electrolytes Lab Results  Component Value Date   NA 136 07/02/2023   K 5.0 07/02/2023   CL 99 07/02/2023   CALCIUM  9.3 07/02/2023   MG 1.8 04/22/2022    Bone Lab Results  Component Value Date   VD25OH 74 07/02/2023   25OHVITD1 35 02/06/2019   25OHVITD2 <1.0 02/06/2019   25OHVITD3 34 02/06/2019    Inflammation (CRP: Acute Phase) (ESR: Chronic Phase) Lab Results  Component Value Date   CRP 9 02/06/2019   ESRSEDRATE 6 02/06/2019         Note: Above Lab results reviewed.  Recent Imaging Review  DG Foot Complete Right CLINICAL DATA:  Jarring foot 3 days prior, no fall  EXAM: RIGHT FOOT COMPLETE - 3+ VIEW  COMPARISON:  Radiograph 03/24/2012  FINDINGS: No acute bony abnormality.  Specifically, no fracture, subluxation, or dislocation. Mild degenerative changes are noted in the midfoot. Posterior calcaneal spur is present. Trace ankle effusion. Soft tissues are otherwise unremarkable  IMPRESSION: No acute fracture or traumatic malalignment.  Mild degenerative changes in the midfoot.  Posterior calcaneal spur.  Trace ankle effusion.  Electronically Signed   By: Gretel Edis M.D.   On: 02/12/2020 19:03 Note: Reviewed        Physical Exam  General appearance: Well nourished, well developed, and well hydrated. In no apparent acute distress Mental status: Alert, oriented x 3 (person, place, & time)       Respiratory:  No evidence of acute respiratory distress Eyes: PERLA Vitals: BP (!) 131/90 (Cuff Size: Normal)   Pulse 71   Temp (!) 97.2 F (36.2 C) (Temporal)   Resp 16   Ht 6' 2 (1.88 m)   Wt 237 lb (107.5 kg)   SpO2 100%   BMI 30.43 kg/m  BMI: Estimated body mass index is 30.43 kg/m as calculated from the following:   Height as of this encounter: 6' 2 (1.88 m).   Weight as of this encounter: 237 lb (107.5 kg). Ideal: Ideal body weight: 82.2 kg (181 lb 3.5 oz) Adjusted ideal body weight: 92.3 kg (203 lb 8.5 oz)  Assessment   Diagnosis Status  1. Chronic pain syndrome   2. Chronic low back pain (1ry area of Pain) (Bilateral) (R>L)   3. Chronic hip pain (2ry area of Pain) (Right)   4. Chronic upper back pain (3ry area of Pain)   5. Chronic shoulder pain (4th area of Pain) (Bilateral) (Right)   6. Chronic sacroiliac joint pain (Right)   7. Chronic knee pain (Right)   8. Pharmacologic therapy   9. Chronic use of opiate for therapeutic purpose   10. Encounter for medication management   11. Encounter for chronic pain management    Controlled Controlled Controlled   Updated Problems: No problems updated.  Plan of Care  Problem-specific:  Assessment and Plan    Chronic Lower Back Pain with Radiculopathy   Chronic lower back pain has  persisted for approximately fifteen years, with radicular pain extending to the right hip. There is difficulty in ambulating long distances due to pain. No new adverse reactions to current medications are reported. Future medication management will be handled by a nurse practitioner, while specialized treatments such as injections will remain under the current provider's care. Send the prescription to the pharmacy. Inform him about the nurse practitioner handling future medication management. Advise requesting appointments for injections or specialized treatments as needed.       Larry Goodwin has a current medication list which includes the following long-term medication(s): naloxone , [START ON 01/23/2024] oxycodone , [START ON 02/22/2024] oxycodone , [START ON 03/23/2024] oxycodone , sildenafil , and magnesium  oxide -mg supplement.  Pharmacotherapy (Medications Ordered): Meds ordered this encounter  Medications   oxyCODONE  (OXY IR/ROXICODONE ) 5 MG immediate release tablet    Sig: Take 1 tablet (5 mg total) by mouth every 6 (six) hours as needed for severe pain (pain score 7-10). Must last 30 days    Dispense:  120 tablet    Refill:  0    DO NOT: delete (not duplicate); no partial-fill (will deny script to complete), no refill request (F/U required). DISPENSE: 1 day early if closed on fill date. WARN: No CNS-depressants within 8 hrs of med.   oxyCODONE  (OXY IR/ROXICODONE ) 5 MG immediate release tablet    Sig: Take 1 tablet (5 mg total) by mouth every 6 (six) hours as needed for severe pain (pain score 7-10). Must last 30 days    Dispense:  120 tablet    Refill:  0    DO NOT: delete (not duplicate); no partial-fill (will deny script to complete), no refill request (F/U required). DISPENSE: 1 day early if closed on fill date. WARN: No CNS-depressants within 8 hrs of med.   oxyCODONE  (OXY IR/ROXICODONE ) 5 MG immediate release tablet    Sig: Take 1 tablet (5 mg total) by mouth every 6 (six) hours as  needed for severe pain (pain score 7-10). Must last 30 days  Dispense:  120 tablet    Refill:  0    DO NOT: delete (not duplicate); no partial-fill (will deny script to complete), no refill request (F/U required). DISPENSE: 1 day early if closed on fill date. WARN: No CNS-depressants within 8 hrs of med.   Orders:  No orders of the defined types were placed in this encounter.  Follow-up plan:   Return in about 3 months (around 04/22/2024) for Eval-day (M,W), (F2F), (MM).      Interventional Therapies  Risk  Complexity Considerations:   GERD  Hx Esophagitis  HTN  T2NIDDM  Nicotine dependence     Planned  Pending:      Under consideration:   None.   Completed:   None since joining practice in 02/06/2019   Therapeutic  Palliative (PRN) options:   None established   Pharmacotherapy  Nonopioids transfer 10/28/2020: Magnesium  (OTC)      Recent Visits No visits were found meeting these conditions. Showing recent visits within past 90 days and meeting all other requirements Today's Visits Date Type Provider Dept  01/19/24 Office Visit Tanya Glisson, MD [facility]  Showing today's visits and meeting all other requirements Future Appointments Date Type Provider Dept  04/12/24 Appointment Tanya Glisson, MD [facility]  Showing future appointments within next 90 days and meeting all other requirements  I discussed the assessment and treatment plan with the patient. The patient was provided an opportunity to ask questions and all were answered. The patient agreed with the plan and demonstrated an understanding of the instructions.  Patient advised to call back or seek an in-person evaluation if the symptoms or condition worsens.  Duration of encounter: 30 minutes.  Total time on encounter, as per AMA guidelines included both the face-to-face and non-face-to-face time personally spent by the physician and/or other qualified health care  professional(s) on the day of the encounter (includes time in activities that require the physician or other qualified health care professional and does not include time in activities normally performed by clinical staff). Physician's time may include the following activities when performed: Preparing to see the patient (e.g., pre-charting review of records, searching for previously ordered imaging, lab work, and nerve conduction tests) Review of prior analgesic pharmacotherapies. Reviewing PMP Interpreting ordered tests (e.g., lab work, imaging, nerve conduction tests) Performing post-procedure evaluations, including interpretation of diagnostic procedures Obtaining and/or reviewing separately obtained history Performing a medically appropriate examination and/or evaluation Counseling and educating the patient/family/caregiver Ordering medications, tests, or procedures Referring and communicating with other health care professionals (when not separately reported) Documenting clinical information in the electronic or other health record Independently interpreting results (not separately reported) and communicating results to the patient/ family/caregiver Care coordination (not separately reported)  Note by: Glisson DELENA Tanya, MD Date: 01/19/2024; Time: 9:31 AM

## 2024-01-18 NOTE — Patient Instructions (Signed)

## 2024-01-19 ENCOUNTER — Ambulatory Visit: Payer: Medicare Other | Attending: Pain Medicine | Admitting: Pain Medicine

## 2024-01-19 ENCOUNTER — Encounter: Payer: Self-pay | Admitting: Family Medicine

## 2024-01-19 ENCOUNTER — Encounter: Payer: Self-pay | Admitting: Pain Medicine

## 2024-01-19 ENCOUNTER — Telehealth: Payer: Self-pay | Admitting: Family Medicine

## 2024-01-19 VITALS — BP 131/90 | HR 71 | Temp 97.2°F | Resp 16 | Ht 74.0 in | Wt 237.0 lb

## 2024-01-19 DIAGNOSIS — G8929 Other chronic pain: Secondary | ICD-10-CM | POA: Insufficient documentation

## 2024-01-19 DIAGNOSIS — Z0279 Encounter for issue of other medical certificate: Secondary | ICD-10-CM

## 2024-01-19 DIAGNOSIS — M25551 Pain in right hip: Secondary | ICD-10-CM | POA: Insufficient documentation

## 2024-01-19 DIAGNOSIS — G894 Chronic pain syndrome: Secondary | ICD-10-CM | POA: Insufficient documentation

## 2024-01-19 DIAGNOSIS — M545 Low back pain, unspecified: Secondary | ICD-10-CM | POA: Diagnosis present

## 2024-01-19 DIAGNOSIS — M25561 Pain in right knee: Secondary | ICD-10-CM | POA: Diagnosis present

## 2024-01-19 DIAGNOSIS — M533 Sacrococcygeal disorders, not elsewhere classified: Secondary | ICD-10-CM | POA: Diagnosis present

## 2024-01-19 DIAGNOSIS — M25512 Pain in left shoulder: Secondary | ICD-10-CM | POA: Diagnosis present

## 2024-01-19 DIAGNOSIS — Z79899 Other long term (current) drug therapy: Secondary | ICD-10-CM | POA: Diagnosis present

## 2024-01-19 DIAGNOSIS — M549 Dorsalgia, unspecified: Secondary | ICD-10-CM | POA: Insufficient documentation

## 2024-01-19 DIAGNOSIS — M25511 Pain in right shoulder: Secondary | ICD-10-CM | POA: Insufficient documentation

## 2024-01-19 DIAGNOSIS — Z79891 Long term (current) use of opiate analgesic: Secondary | ICD-10-CM | POA: Insufficient documentation

## 2024-01-19 MED ORDER — OXYCODONE HCL 5 MG PO TABS: 5.0000 mg | ORAL_TABLET | Freq: Four times a day (QID) | ORAL | 0 refills | Status: DC | PRN

## 2024-01-19 MED ORDER — OXYCODONE HCL 5 MG PO TABS
5.0000 mg | ORAL_TABLET | Freq: Four times a day (QID) | ORAL | 0 refills | Status: DC | PRN
Start: 2024-03-23 — End: 2024-04-11

## 2024-01-19 MED ORDER — OXYCODONE HCL 5 MG PO TABS
5.0000 mg | ORAL_TABLET | Freq: Four times a day (QID) | ORAL | 0 refills | Status: DC | PRN
Start: 2024-02-22 — End: 2024-04-11

## 2024-01-19 NOTE — Telephone Encounter (Signed)
 Confirmed pt is still waiting in lobby for form (see 01/19/24 pt msg).

## 2024-01-19 NOTE — Telephone Encounter (Signed)
Placed form at front office. There is $20.00 fee to have form completed.   [Made copy to scan.]

## 2024-01-19 NOTE — Telephone Encounter (Signed)
 Patient dropped off document DMV, to be filled out by provider. Patient requested to send it back via Call Patient to pick up within 5-days. Document is located in providers tray at front office.Please advise at Mobile [phone] (mobile)

## 2024-01-19 NOTE — Telephone Encounter (Signed)
 Pt brought in [employer] form to be completed. States he lives 1 hr away and is here now, hoping Dr KANDICE could go ahead and fill out form now.   Advised pt Dr KANDICE is currently seeing pts and will complete form when he gets a chance. But pt is welcome to wait. Pt verbalizes understanding and chooses to wait in lobby.   Placed form in Dr Talmadge box.

## 2024-01-19 NOTE — Progress Notes (Signed)
 Nursing Pain Medication Assessment:  Safety precautions to be maintained throughout the outpatient stay will include: orient to surroundings, keep bed in low position, maintain call bell within reach at all times, provide assistance with transfer out of bed and ambulation.   Medication Inspection Compliance: Pill count conducted under aseptic conditions, in front of the patient. Neither the pills nor the bottle was removed from the patient's sight at any time. Once count was completed pills were immediately returned to the patient in their original bottle.  Medication: Oxycodone  IR Pill/Patch Count:  15 of 120 pills remain Pill/Patch Appearance: Markings consistent with prescribed medication Bottle Appearance: Standard pharmacy container. Clearly labeled. Filled Date: 01 / 10 / 2025 Last Medication intake:  Today

## 2024-01-19 NOTE — Telephone Encounter (Signed)
Filled and placed in Lisa's box.

## 2024-03-30 ENCOUNTER — Ambulatory Visit (INDEPENDENT_AMBULATORY_CARE_PROVIDER_SITE_OTHER): Payer: Medicare Other

## 2024-03-30 VITALS — Ht 74.0 in | Wt 237.0 lb

## 2024-03-30 DIAGNOSIS — Z0000 Encounter for general adult medical examination without abnormal findings: Secondary | ICD-10-CM | POA: Diagnosis not present

## 2024-03-30 NOTE — Progress Notes (Signed)
 Subjective:   Larry Goodwin is a 48 y.o. who presents for a Medicare Wellness preventive visit.  Visit Complete: Virtual I connected with  [name] on 03/30/24 by a audio enabled telemedicine application and verified that I am speaking with the correct person using two identifiers.  Patient Location: Home  Provider Location: Office/Clinic  I discussed the limitations of evaluation and management by telemedicine. The patient expressed understanding and agreed to proceed.  Vital Signs: Because this visit was a virtual/telehealth visit, some criteria may be missing or patient reported. Any vitals not documented were not able to be obtained and vitals that have been documented are patient reported.  VideoDeclined- This patient declined Librarian, academic. Therefore the visit was completed with audio only.  Persons Participating in Visit: Patient.  AWV Questionnaire: No: Patient Medicare AWV questionnaire was not completed prior to this visit.  Cardiac Risk Factors include: advanced age (>84men, >30 women);diabetes mellitus;hypertension;male gender;obesity (BMI >30kg/m2)     Objective:    Today's Vitals   03/30/24 1446  Weight: 237 lb (107.5 kg)  Height: 6\' 2"  (1.88 m)  PainSc: 3    Body mass index is 30.43 kg/m.     03/30/2024    2:57 PM 01/19/2024    8:31 AM 10/11/2023    8:35 AM 07/21/2023    8:08 AM 04/21/2023   10:25 AM 03/30/2023    2:43 PM 07/20/2022   10:26 AM  Advanced Directives  Does Patient Have a Medical Advance Directive? No No No No No No No  Would patient like information on creating a medical advance directive?  No - Patient declined No - Patient declined No - Patient declined No - Patient declined No - Patient declined No - Patient declined    Current Medications (verified) Outpatient Encounter Medications as of 03/30/2024  Medication Sig   Cholecalciferol (VITAMIN D3) 125 MCG (5000 UT) CAPS Take 1 capsule (5,000 Units  total) by mouth in the morning and at bedtime.   naloxone (NARCAN) nasal spray 4 mg/0.1 mL Place 1 spray into the nose as needed for up to 365 doses (for opioid-induced respiratory depresssion). In case of emergency (overdose), spray once into each nostril. If no response within 3 minutes, repeat application and call 911.   nystatin cream (MYCOSTATIN) APPLY AS DIRECTED 2 TIMES DAILY   oxyCODONE (OXY IR/ROXICODONE) 5 MG immediate release tablet Take 1 tablet (5 mg total) by mouth every 6 (six) hours as needed for severe pain (pain score 7-10). Must last 30 days   sildenafil (VIAGRA) 100 MG tablet Take 0.5-1 tablets (50-100 mg total) by mouth daily as needed for erectile dysfunction.   triamcinolone cream (KENALOG) 0.1 % Apply 1 Application topically 2 (two) times daily. (Patient taking differently: Apply 1 Application topically 2 (two) times daily. PRN)   vitamin B-12 (CYANOCOBALAMIN) 1000 MCG tablet Take 1,000 mcg by mouth daily.   Magnesium Oxide 500 MG CAPS Take 1 capsule (500 mg total) by mouth daily.   oxyCODONE (OXY IR/ROXICODONE) 5 MG immediate release tablet Take 1 tablet (5 mg total) by mouth every 6 (six) hours as needed for severe pain (pain score 7-10). Must last 30 days   oxyCODONE (OXY IR/ROXICODONE) 5 MG immediate release tablet Take 1 tablet (5 mg total) by mouth every 6 (six) hours as needed for severe pain (pain score 7-10). Must last 30 days   No facility-administered encounter medications on file as of 03/30/2024.    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   HTN (hypertension)    Morbid obesity (HCC) 08/16/2014   Past Surgical History:  Procedure Laterality Date   BACK SURGERY  2012   x 2  Larry Goodwin)   CHOLECYSTECTOMY     Family History  Problem Relation Age of Onset   COPD Mother        smoker (deceased at 50)   Hypertension Other    Stroke Neg Hx    Cancer Neg Hx    Diabetes Neg Hx    CAD Neg Hx    Social History   Socioeconomic History   Marital status: Married    Spouse name: Not on file   Number of children: Not on file   Years of education: Not on file   Highest education level: Not on file  Occupational History   Not on file  Tobacco Use   Smoking status: Former    Current packs/day: 0.00    Types: Cigarettes    Quit date: 12/15/2007    Years since quitting: 16.3   Smokeless tobacco: Never   Tobacco comments:    quit 6 years ago  Vaping Use   Vaping status: Never Used  Substance and Sexual Activity   Alcohol use: No    Alcohol/week: 0.0 standard drinks of alcohol   Drug use: Yes    Types: Oxycodone   Sexual activity: Yes  Other Topics Concern   Not on file  Social History Narrative   Lives with wife and 3 children, 2 dogs   Occupation: football Psychologist, occupational at [employer], on disability due to back   Edu: HS   Activity: coaches   Diet: good water, fruits/vegetables daily   Social Drivers of Corporate investment banker Strain: Low Risk  (03/30/2024)   Overall Financial Resource Strain (CARDIA)    Difficulty of Paying Living Expenses: Not hard at all  Food Insecurity: No Food Insecurity (03/30/2024)   Hunger Vital Sign    Worried About Running Out of Food in the Last Year: Never true    Ran Out of Food in the Last Year: Never true  Transportation Needs: No Transportation Needs (03/30/2024)   PRAPARE - Administrator, Civil Service (Medical): No    Lack of Transportation (Non-Medical): No  Physical Activity: Insufficiently Active (03/30/2024)   Exercise Vital Sign    Days of Exercise per Week: 3 days    Minutes of Exercise per Session: 20 min  Stress: No Stress Concern Present (03/30/2023)   [employer] of Occupational Health  - Occupational Stress Questionnaire    Feeling of Stress : Not at all  Social Connections: Moderately Integrated (03/30/2024)   Social Connection and Isolation Panel [NHANES]    Frequency of Communication with Friends and Family: More than three times a week    Frequency of Social Gatherings with Friends and Family: More than three times a week    Attends Religious Services: More than 4 times per year    Active Member of [employer] or Organizations: No    Attends Banker Meetings: Never    Marital Status: Married    Tobacco Counseling Counseling given: Not Answered Tobacco comments: quit  6 years ago    Clinical Intake:  Pre-visit preparation completed: Yes  Pain : 0-10 Pain Score: 3  Pain Type: Chronic pain Pain Location: Back Pain Orientation: Lower Pain Radiating Towards: radiates toward rt hip Pain Descriptors / Indicators: Aching, Constant, Dull, Shooting Pain Onset: More than a month ago Pain Frequency: Intermittent Pain Relieving Factors: medications, rest, walk when can Effect of Pain on Daily Activities: limits daily activities and what i can do  Pain Relieving Factors: medications, rest, walk when can  BMI - recorded: 30.43 Nutritional Status: BMI > 30  Obese Nutritional Risks: None Diabetes: Yes CBG done?: No Did pt. bring in CBG monitor from home?: No  Lab Results  Component Value Date   HGBA1C 5.4 07/02/2023   HGBA1C 13.6 (H) 03/31/2023   HGBA1C 13.1 (A) 03/31/2023     How often do you need to have someone help you when you read instructions, pamphlets, or other written materials from your doctor or pharmacy?: 1 - Never  Interpreter Needed?: No  Comments: lives with wife Information entered by :: B.Zymere Patlan,LPN   Activities of Daily Living     03/30/2024    2:57 PM  In your present state of health, do you have any difficulty performing the following activities:  Hearing? 0  Vision? 0  Difficulty concentrating or making decisions? 0   Walking or climbing stairs? 1  Dressing or bathing? 0  Doing errands, shopping? 0  Preparing Food and eating ? N  Using the Toilet? N  In the past six months, have you accidently leaked urine? N  Do you have problems with loss of bowel control? N  Managing your Medications? N  Managing your Finances? N  Housekeeping or managing your Housekeeping? N    Patient Care Team: Eustaquio Boyden, MD as PCP - General (Family Medicine)  Indicate any recent Medical Services you may have received from other than [facility] providers in the past year (date may be approximate).     Assessment:   This is a routine wellness examination for Knowledge.  Hearing/Vision screen Hearing Screening - Comments:: Pt says his hearing is good Vision Screening - Comments:: Pt says his vision is good w/glasses Moved to [place]-will schedule there   Goals Addressed             This Visit's Progress    Patient Stated   On track    03/30/24- I will maintain and continue medications as prescribed.      Patient Stated   On track    03/30/24-Would like to walk more     COMPLETED: Patient Stated   On track    Eat healthy and lose 50lbs.     COMPLETED: Weight (lb) < 200 lb (90.7 kg)   237 lb (107.5 kg)    07/17/2019, wants to live healthier and continue to lose weight       Depression Screen     03/30/2024    2:55 PM 01/19/2024    8:31 AM 10/11/2023    8:34 AM 07/02/2023    8:46 AM 04/21/2023   10:25 AM 03/31/2023   10:30 AM 03/30/2023    2:42 PM  PHQ 2/9 Scores  PHQ - 2 Score 0 0 0 0 0 0 0    Fall Risk     03/30/2024    2:50 PM 01/19/2024    8:31 AM 10/11/2023    8:34 AM 07/21/2023    8:08 AM 07/02/2023    8:45 AM  Fall  Risk   Falls in the past year? 0 0 0 0 0  Number falls in past yr: 0 0     Injury with Fall? 0 0     Risk for fall due to : No Fall Risks History of fall(s)     Follow up Falls prevention discussed;Education provided        MEDICARE RISK AT HOME:  Medicare Risk at Home Any  stairs in or around the home?: No If so, are there any without handrails?: No Home free of loose throw rugs in walkways, pet beds, electrical cords, etc?: Yes Adequate lighting in your home to reduce risk of falls?: Yes Life alert?: No Use of a cane, walker or w/c?: No Grab bars in the bathroom?: Yes Shower chair or bench in shower?: No Elevated toilet seat or a handicapped toilet?: Yes  TIMED UP AND GO:  Was the test performed?  No  Cognitive Function: 6CIT completed    12/27/2020    1:29 PM 07/17/2019    2:07 PM 07/12/2018    2:56 PM  MMSE - Mini Mental State Exam  Orientation to time 5 5 5   Orientation to Place 5 5 5   Registration 3 3 3   Attention/ Calculation 5 5 0  Recall 3 3 3   Language- name 2 objects  0 0  Language- repeat 1 1 1   Language- follow 3 step command  0 3  Language- read & follow direction  0 0  Write a sentence  0 0  Copy design  0 0  Total score  22 20        03/30/2024    2:58 PM 03/30/2023    2:44 PM  6CIT Screen  What Year? 0 points 0 points  What month? 0 points 0 points  What time? 0 points 0 points  Count back from 20 0 points 0 points  Months in reverse 0 points 0 points  Repeat phrase 0 points 0 points  Total Score 0 points 0 points    Immunizations  There is no immunization history on file for this patient.  Screening Tests Health Maintenance  Topic Date Due   OPHTHALMOLOGY EXAM  Never done   DTaP/Tdap/Td (1 - Tdap) Never done   Pneumococcal Vaccine 66-60 Years old (1 of 2 - PCV) Never done   Colonoscopy  Never done   HEMOGLOBIN A1C  01/02/2024   Diabetic kidney evaluation - Urine ACR  03/30/2024   HIV Screening  07/12/2049 (Originally 09/07/1991)   Diabetic kidney evaluation - eGFR measurement  07/01/2024   FOOT EXAM  07/01/2024   INFLUENZA VACCINE  07/14/2024   Medicare Annual Wellness (AWV)  03/30/2025   Hepatitis C Screening  Completed   HPV VACCINES  Aged Out   Meningococcal B Vaccine  Aged Out   COVID-19 Vaccine   Discontinued    Health Maintenance  Health Maintenance Due  Topic Date Due   OPHTHALMOLOGY EXAM  Never done   DTaP/Tdap/Td (1 - Tdap) Never done   Pneumococcal Vaccine 62-27 Years old (1 of 2 - PCV) Never done   Colonoscopy  Never done   HEMOGLOBIN A1C  01/02/2024   Diabetic kidney evaluation - Urine ACR  03/30/2024   Health Maintenance Items Addressed: Pt says he will make appt for eye exam in [employer]  Additional Screening:  Vision Screening: Recommended annual ophthalmology exams for early detection of glaucoma and other disorders of the eye.  Dental Screening: Recommended annual dental exams for proper  oral hygiene  Community Resource Referral / Chronic Care Management: CRR required this visit?  No   CCM required this visit?  No    Plan:     I have personally reviewed and noted the following in the patient's chart:   Medical and social history Use of alcohol, tobacco or illicit drugs  Current medications and supplements including opioid prescriptions. Patient is currently taking opioid prescriptions. Information provided to patient regarding non-opioid alternatives. Patient advised to discuss non-opioid treatment plan with their provider. Functional ability and status Nutritional status Physical activity Advanced directives List of other physicians Hospitalizations, surgeries, and ER visits in previous 12 months Vitals Screenings to include cognitive, depression, and falls Referrals and appointments  In addition, I have reviewed and discussed with patient certain preventive protocols, quality metrics, and best practice recommendations. A written personalized care plan for preventive services as well as general preventive health recommendations were provided to patient.    Nerissa Bannister, LPN   [id number]/61/[id number]   After Visit Summary: (MyChart) Due to this being a telephonic visit, the after visit summary with patients personalized plan was offered to patient via  MyChart   Notes: Nothing significant to report at this time.

## 2024-03-30 NOTE — Patient Instructions (Signed)
 [name] , Thank you for taking time to come for your Medicare Wellness Visit. I appreciate your ongoing commitment to your health goals. Please review the following plan we discussed and let me know if I can assist you in the future.   Referrals/Orders/Follow-Ups/Clinician Recommendations: none  This is a list of the screening recommended for you and due dates:  Health Maintenance  Topic Date Due   Eye exam for diabetics  Never done   DTaP/Tdap/Td vaccine (1 - Tdap) Never done   Pneumococcal Vaccination (1 of 2 - PCV) Never done   Colon Cancer Screening  Never done   Hemoglobin A1C  01/02/2024   Yearly kidney health urinalysis for diabetes  03/30/2024   HIV Screening  07/12/2049*   Yearly kidney function blood test for diabetes  07/01/2024   Complete foot exam   07/01/2024   Flu Shot  07/14/2024   Medicare Annual Wellness Visit  03/30/2025   Hepatitis C Screening  Completed   HPV Vaccine  Aged Out   Meningitis B Vaccine  Aged Out   COVID-19 Vaccine  Discontinued  *Topic was postponed. The date shown is not the original due date.    Advanced directives: (Declined) Advance directive discussed with you today. Even though you declined this today, please call our office should you change your mind, and we can give you the proper paperwork for you to fill out.  Next Medicare Annual Wellness Visit scheduled for next year: Yes 04/02/25 @ 3pm televisit

## 2024-04-03 ENCOUNTER — Encounter: Payer: Self-pay | Admitting: Family Medicine

## 2024-04-03 ENCOUNTER — Ambulatory Visit (INDEPENDENT_AMBULATORY_CARE_PROVIDER_SITE_OTHER): Payer: Medicare Other | Admitting: Family Medicine

## 2024-04-03 VITALS — BP 118/82 | HR 65 | Temp 97.6°F | Ht 72.5 in | Wt 230.2 lb

## 2024-04-03 DIAGNOSIS — H6191 Disorder of right external ear, unspecified: Secondary | ICD-10-CM | POA: Diagnosis not present

## 2024-04-03 DIAGNOSIS — R634 Abnormal weight loss: Secondary | ICD-10-CM

## 2024-04-03 DIAGNOSIS — G894 Chronic pain syndrome: Secondary | ICD-10-CM

## 2024-04-03 DIAGNOSIS — Z7189 Other specified counseling: Secondary | ICD-10-CM | POA: Diagnosis not present

## 2024-04-03 DIAGNOSIS — Z136 Encounter for screening for cardiovascular disorders: Secondary | ICD-10-CM

## 2024-04-03 DIAGNOSIS — N528 Other male erectile dysfunction: Secondary | ICD-10-CM

## 2024-04-03 DIAGNOSIS — Z8679 Personal history of other diseases of the circulatory system: Secondary | ICD-10-CM

## 2024-04-03 DIAGNOSIS — E538 Deficiency of other specified B group vitamins: Secondary | ICD-10-CM | POA: Diagnosis not present

## 2024-04-03 DIAGNOSIS — E66811 Obesity, class 1: Secondary | ICD-10-CM

## 2024-04-03 DIAGNOSIS — Z8639 Personal history of other endocrine, nutritional and metabolic disease: Secondary | ICD-10-CM

## 2024-04-03 DIAGNOSIS — E559 Vitamin D deficiency, unspecified: Secondary | ICD-10-CM | POA: Diagnosis not present

## 2024-04-03 DIAGNOSIS — Z13220 Encounter for screening for lipoid disorders: Secondary | ICD-10-CM

## 2024-04-03 LAB — CBC WITH DIFFERENTIAL/PLATELET
Basophils Absolute: 0 10*3/uL (ref 0.0–0.1)
Basophils Relative: 0.7 % (ref 0.0–3.0)
Eosinophils Absolute: 0.1 10*3/uL (ref 0.0–0.7)
Eosinophils Relative: 4.2 % (ref 0.0–5.0)
HCT: 44.9 % (ref 39.0–52.0)
Hemoglobin: 15.4 g/dL (ref 13.0–17.0)
Lymphocytes Relative: 31.6 % (ref 12.0–46.0)
Lymphs Abs: 1.1 10*3/uL (ref 0.7–4.0)
MCHC: 34.4 g/dL (ref 30.0–36.0)
MCV: 87.2 fl (ref 78.0–100.0)
Monocytes Absolute: 0.5 10*3/uL (ref 0.1–1.0)
Monocytes Relative: 14.1 % — ABNORMAL HIGH (ref 3.0–12.0)
Neutro Abs: 1.7 10*3/uL (ref 1.4–7.7)
Neutrophils Relative %: 49.4 % (ref 43.0–77.0)
Platelets: 184 10*3/uL (ref 150.0–400.0)
RBC: 5.15 Mil/uL (ref 4.22–5.81)
RDW: 13.6 % (ref 11.5–15.5)
WBC: 3.4 10*3/uL — ABNORMAL LOW (ref 4.0–10.5)

## 2024-04-03 LAB — COMPREHENSIVE METABOLIC PANEL WITH GFR
ALT: 14 U/L (ref 0–53)
AST: 15 U/L (ref 0–37)
Albumin: 4.4 g/dL (ref 3.5–5.2)
Alkaline Phosphatase: 76 U/L (ref 39–117)
BUN: 21 mg/dL (ref 6–23)
CO2: 29 meq/L (ref 19–32)
Calcium: 9 mg/dL (ref 8.4–10.5)
Chloride: 99 meq/L (ref 96–112)
Creatinine, Ser: 0.81 mg/dL (ref 0.40–1.50)
GFR: 104.22 mL/min (ref >60.00)
Glucose, Bld: 91 mg/dL (ref 70–99)
Potassium: 4.8 meq/L (ref 3.5–5.1)
Sodium: 136 meq/L (ref 135–145)
Total Bilirubin: 1.1 mg/dL (ref 0.2–1.2)
Total Protein: 6.8 g/dL (ref 6.0–8.3)

## 2024-04-03 LAB — TSH: TSH: 3.46 u[IU]/mL (ref 0.35–5.50)

## 2024-04-03 LAB — LIPID PANEL
Cholesterol: 245 mg/dL — ABNORMAL HIGH (ref 0–200)
HDL: 50 mg/dL (ref >39.00)
LDL Cholesterol: 185 mg/dL — ABNORMAL HIGH (ref 0–99)
NonHDL: 195.04
Total CHOL/HDL Ratio: 5
Triglycerides: 50 mg/dL (ref 0.0–149.0)
VLDL: 10 mg/dL (ref 0.0–40.0)

## 2024-04-03 LAB — VITAMIN D 25 HYDROXY (VIT D DEFICIENCY, FRACTURES): VITD: 68.48 ng/mL (ref 30.00–100.00)

## 2024-04-03 LAB — HEMOGLOBIN A1C: Hgb A1c MFr Bld: 5.3 % (ref 4.6–6.5)

## 2024-04-03 LAB — VITAMIN B12: Vitamin B-12: 1449 pg/mL — ABNORMAL HIGH (ref 211–911)

## 2024-04-03 MED ORDER — SILDENAFIL CITRATE 100 MG PO TABS: 50.0000 mg | ORAL_TABLET | Freq: Every day | ORAL | 11 refills | Status: AC | PRN

## 2024-04-03 MED ORDER — SILDENAFIL CITRATE 100 MG PO TABS
50.0000 mg | ORAL_TABLET | Freq: Every day | ORAL | 11 refills | Status: DC | PRN
Start: 2024-04-03 — End: 2024-04-03

## 2024-04-03 NOTE — Assessment & Plan Note (Signed)
 This has resolved with weight loss. Now off ACEI.

## 2024-04-03 NOTE — Assessment & Plan Note (Signed)
 Congratulated on total 78 lb weight loss since 2024.  He continues following carnivore diet. Update labs today.  Anticipate hypertension and diabetes have resolved.

## 2024-04-03 NOTE — Assessment & Plan Note (Signed)
 Viagra refilled-this is effective.

## 2024-04-03 NOTE — Assessment & Plan Note (Signed)
 Previously declined packet.

## 2024-04-03 NOTE — Assessment & Plan Note (Signed)
Update levels on regular replacement.  °

## 2024-04-03 NOTE — Assessment & Plan Note (Signed)
 Continues seeing pain clinic, on oxycodone 

## 2024-04-03 NOTE — Assessment & Plan Note (Signed)
 Update labs after significant weight loss. Anticipate diabetes has resolved.  Congratulated.

## 2024-04-03 NOTE — Assessment & Plan Note (Addendum)
 R earlobe lesion present for about a year of unclear etiology ?pressure injury from sleeping on that ear. Will refer to derm local to his home in [place] per his request, r/o bcc.

## 2024-04-03 NOTE — Progress Notes (Signed)
 Ph: [phone] Fax: [phone]   Patient ID: Larry Goodwin, male    DOB: 1976-10-09, 48 y.o.   MRN: [id number]  This visit was conducted in person.  BP 118/82   Pulse 65   Temp 97.6 F (36.4 C) (Oral)   Ht 6' 0.5" (1.842 m)   Wt 230 lb 4 oz (104.4 kg)   SpO2 94%   BMI 30.80 kg/m    CC: AMW f/u visit  Subjective:   HPI: Larry Goodwin is a 48 y.o. male presenting on 04/03/2024 for Annual Exam (MCR prt 2 [AWV- 03/30/24].)   Saw health advisor last week for medicare wellness visit. Note reviewed.   No results found.  Flowsheet Row Clinical Support from 03/30/2024 in [facility] [place]  PHQ-2 Total Score 0          03/30/2024    2:50 PM 01/19/2024    8:31 AM 10/11/2023    8:34 AM 07/21/2023    8:08 AM 07/02/2023    8:45 AM  Fall Risk   Falls in the past year? 0 0 0 0 0  Number falls in past yr: 0 0     Injury with Fall? 0 0     Risk for fall due to : No Fall Risks History of fall(s)     Follow up Falls prevention discussed;Education provided        45 lb weight loss since 06/2023. Total 78 lbs - following carnivore diet. Only drinking water. Feeling well with this, this is working well for him. Limits sugars, carbs, fruits/vegetables, dairy besides irish butter.   Has twin girls 2022   Sees pain clinic ([facility]) for chronic pain - continues oxycodone .  Continues walking regularly.   EGD 2009 - possible EoE Dawne Euler) - however biopsy not consistent with this. He is not on PPI. No heartburn symptoms. Symptoms improved after cholecystectomy.   Hypertension and diabetes have resolved with weight loss.  Lab Results  Component Value Date   HGBA1C 5.4 07/02/2023   H/o chronic sinus congestion and drainage - this also improved with weight loss.   Preventative: Colon cancer screening - discussed options, reviewed reasons to screen and risk of missed cancer dx, declines testing at this time. Denies blood in stool or bowel changes.  No  fmhx prostate cancer Lung cancer screening - not eligible  Flu shot - declines COVID vaccine - declines Tetanus shot - unknown date Pneumococcal - declines Advanced directive - does not have this set up. Would want Danielle wife to be HCPOA. Packet previously declined.  Seat belt use discussed  Sunscreen use discussed. Doesn't use but covers up including bucket hat. No changing moles on skin. Sunburns easily.  Ex - smoker quit 2009, previously social smoker  Alcohol - none Rec drugs - none Dentist - has full dentures, considering implants  Eye exam yearly - due Bowel - no constipation  Bladder - no incontinence   Lives with wife and 3 children, 2 dogs Occupation: football Psychologist, occupational at [employer], on disability due to back  Edu: HS Activity: coaching, walking regularly  Diet: good water, fruits/vegetables daily     Relevant past medical, surgical, family and social history reviewed and updated as indicated. Interim medical history since our last visit reviewed. Allergies and medications reviewed and updated. Outpatient Medications Prior to Visit  Medication Sig Dispense Refill   Cholecalciferol (VITAMIN D3) 125 MCG (5000 UT) CAPS Take 1 capsule (5,000 Units total) by mouth  in the morning and at bedtime.     naloxone  (NARCAN ) nasal spray 4 mg/0.1 mL Place 1 spray into the nose as needed for up to 365 doses (for opioid-induced respiratory depresssion). In case of emergency (overdose), spray once into each nostril. If no response within 3 minutes, repeat application and call 911. 1 each 0   nystatin  cream (MYCOSTATIN ) APPLY AS DIRECTED 2 TIMES DAILY 30 g 0   oxyCODONE  (OXY IR/ROXICODONE ) 5 MG immediate release tablet Take 1 tablet (5 mg total) by mouth every 6 (six) hours as needed for severe pain (pain score 7-10). Must last 30 days 120 tablet 0   triamcinolone  cream (KENALOG ) 0.1 % Apply 1 Application topically 2 (two) times daily. (Patient taking differently: Apply 1 Application  topically 2 (two) times daily. PRN) 30 g 0   vitamin B-12 (CYANOCOBALAMIN ) 1000 MCG tablet Take 1,000 mcg by mouth daily.     sildenafil  (VIAGRA ) 100 MG tablet Take 0.5-1 tablets (50-100 mg total) by mouth daily as needed for erectile dysfunction. 5 tablet 3   Magnesium  Oxide 500 MG CAPS Take 1 capsule (500 mg total) by mouth daily. 30 capsule 2   oxyCODONE  (OXY IR/ROXICODONE ) 5 MG immediate release tablet Take 1 tablet (5 mg total) by mouth every 6 (six) hours as needed for severe pain (pain score 7-10). Must last 30 days 120 tablet 0   oxyCODONE  (OXY IR/ROXICODONE ) 5 MG immediate release tablet Take 1 tablet (5 mg total) by mouth every 6 (six) hours as needed for severe pain (pain score 7-10). Must last 30 days 120 tablet 0   No facility-administered medications prior to visit.     Per HPI unless specifically indicated in ROS section below Review of Systems  Objective:  BP 118/82   Pulse 65   Temp 97.6 F (36.4 C) (Oral)   Ht 6' 0.5" (1.842 m)   Wt 230 lb 4 oz (104.4 kg)   SpO2 94%   BMI 30.80 kg/m   Wt Readings from Last 3 Encounters:  04/03/24 230 lb 4 oz (104.4 kg)  03/30/24 237 lb (107.5 kg)  01/19/24 237 lb (107.5 kg)      Physical Exam Vitals and nursing note reviewed.  Constitutional:      General: He is not in acute distress.    Appearance: Normal appearance. He is well-developed. He is not ill-appearing.  HENT:     Head: Normocephalic and atraumatic.     Right Ear: Hearing, tympanic membrane, ear canal and external ear normal.     Left Ear: Hearing, tympanic membrane, ear canal and external ear normal.     Ears:      Comments: Nontender nodular chronic growth to pinna of R earlobe     Nose: Nose normal.     Mouth/Throat:     Mouth: Mucous membranes are moist.     Pharynx: Oropharynx is clear. No oropharyngeal exudate or posterior oropharyngeal erythema.  Eyes:     General: No scleral icterus.    Extraocular Movements: Extraocular movements intact.      Conjunctiva/sclera: Conjunctivae normal.     Pupils: Pupils are equal, round, and reactive to light.  Neck:     Thyroid : No thyroid  mass or thyromegaly.  Cardiovascular:     Rate and Rhythm: Normal rate and regular rhythm.     Pulses: Normal pulses.          Radial pulses are 2+ on the right side and 2+ on the left side.  Heart sounds: Normal heart sounds. No murmur heard. Pulmonary:     Effort: Pulmonary effort is normal. No respiratory distress.     Breath sounds: Normal breath sounds. No wheezing, rhonchi or rales.  Abdominal:     General: Bowel sounds are normal. There is no distension.     Palpations: Abdomen is soft. There is no mass.     Tenderness: There is no abdominal tenderness. There is no guarding or rebound.     Hernia: No hernia is present.  Musculoskeletal:        General: Normal range of motion.     Cervical back: Normal range of motion and neck supple.     Right lower leg: No edema.     Left lower leg: No edema.  Lymphadenopathy:     Cervical: No cervical adenopathy.  Skin:    General: Skin is warm and dry.     Findings: No rash.  Neurological:     General: No focal deficit present.     Mental Status: He is alert and oriented to person, place, and time.  Psychiatric:        Mood and Affect: Mood normal.        Behavior: Behavior normal.        Thought Content: Thought content normal.        Judgment: Judgment normal.       Results for orders placed or performed in visit on 07/02/23  POCT glycosylated hemoglobin (Hb A1C)   Collection Time: 07/02/23  8:46 AM  Result Value Ref Range   Hemoglobin A1C 5.4 4.0 - 5.6 %   HbA1c POC (<> result, manual entry)     HbA1c, POC (prediabetic range)     HbA1c, POC (controlled diabetic range)    Lipid panel   Collection Time: 07/02/23  9:45 AM  Result Value Ref Range   Cholesterol 198 <200 mg/dL   HDL 34 (L) > OR = 40 mg/dL   Triglycerides 93 <161 mg/dL   LDL Cholesterol (Calc) 144 (H) mg/dL (calc)   Total  CHOL/HDL Ratio 5.8 (H) <5.0 (calc)   Non-HDL Cholesterol (Calc) 164 (H) <130 mg/dL (calc)  Comprehensive metabolic panel   Collection Time: 07/02/23  9:45 AM  Result Value Ref Range   Glucose, Bld 100 (H) 65 - 99 mg/dL   BUN 16 7 - 25 mg/dL   Creat 0.96 0.45 - 4.09 mg/dL   BUN/Creatinine Ratio SEE NOTE: 6 - 22 (calc)   Sodium 136 135 - 146 mmol/L   Potassium 5.0 3.5 - 5.3 mmol/L   Chloride 99 98 - 110 mmol/L   CO2 28 20 - 32 mmol/L   Calcium 9.3 8.6 - 10.3 mg/dL   Total Protein 7.0 6.1 - 8.1 g/dL   Albumin 4.2 3.6 - 5.1 g/dL   Globulin 2.8 1.9 - 3.7 g/dL (calc)   AG Ratio 1.5 1.0 - 2.5 (calc)   Total Bilirubin 0.9 0.2 - 1.2 mg/dL   Alkaline phosphatase (APISO) 63 36 - 130 U/L   AST 12 10 - 40 U/L   ALT 10 9 - 46 U/L  Vitamin B12   Collection Time: 07/02/23 11:09 AM  Result Value Ref Range   Vitamin B-12 970 200 - 1,100 pg/mL  VITAMIN D  25 Hydroxy (Vit-D Deficiency, Fractures)   Collection Time: 07/02/23 11:09 AM  Result Value Ref Range   Vit D, 25-Hydroxy 74 30 - 100 ng/mL    Assessment & Plan:   Problem List Items Addressed  This Visit     Chronic pain syndrome (Chronic)   Continues seeing pain clinic, on oxycodone       Advanced care planning/counseling discussion - Primary (Chronic)   Previously declined packet.        History of hypertension   This has resolved with weight loss. Now off ACEI.       Obesity, Class I, BMI 30-34.9   Congratulated on total 78 lb weight loss since 2024.  He continues following carnivore diet. Update labs today.  Anticipate hypertension and diabetes have resolved.      History of diabetes mellitus   Update labs after significant weight loss. Anticipate diabetes has resolved.  Congratulated.       Relevant Orders   Lipid panel   Comprehensive metabolic panel with GFR   Hemoglobin A1c   Vitamin D  deficiency   Update levels on regular replacement.       Relevant Orders   VITAMIN D  25 Hydroxy (Vit-D Deficiency, Fractures)    Vitamin B12 deficiency   Update levels on regular replacement.       Relevant Orders   Vitamin B12   Erectile dysfunction   Viagra  refilled - this is effective.       Relevant Medications   sildenafil  (VIAGRA ) 100 MG tablet   Skin lesion of right ear   R earlobe lesion present for about a year of unclear etiology ?pressure injury from sleeping on that ear. Will refer to derm local to his home in [place] per his request, r/o bcc.       Relevant Orders   Ambulatory referral to Dermatology   Other Visit Diagnoses       Weight loss       Relevant Orders   Comprehensive metabolic panel with GFR   TSH   CBC with Differential/Platelet     Encounter for lipid screening for cardiovascular disease       Relevant Orders   Lipid panel        Meds ordered this encounter  Medications   DISCONTD: sildenafil  (VIAGRA ) 100 MG tablet    Sig: Take 0.5-1 tablets (50-100 mg total) by mouth daily as needed for erectile dysfunction.    Dispense:  5 tablet    Refill:  11   sildenafil  (VIAGRA ) 100 MG tablet    Sig: Take 0.5-1 tablets (50-100 mg total) by mouth daily as needed for erectile dysfunction.    Dispense:  10 tablet    Refill:  11    Orders Placed This Encounter  Procedures   Lipid panel   Comprehensive metabolic panel with GFR   TSH   Hemoglobin A1c   CBC with Differential/Platelet   Vitamin B12   VITAMIN D  25 Hydroxy (Vit-D Deficiency, Fractures)   Ambulatory referral to Dermatology    Referral Priority:   Routine    Referral Type:   Consultation    Referral Reason:   Specialty Services Required    Requested Specialty:   Dermatology    Number of Visits Requested:   1    Patient Instructions  Labs today  Congratulations on weight loss to date! Consider colon cancer screening - colonoscopy vs stool test - and let me know if desired for referral.  We will refer you to dermatology  Gerhardt Knudsen) Return as needed or in 1 year for next wellness visit /follow up  visit.   Follow up plan: Return in about 1 year (around 04/03/2025) for medicare wellness visit, follow up visit.  Claire Crick,  MD

## 2024-04-03 NOTE — Patient Instructions (Addendum)
 Labs today  Congratulations on weight loss to date! Consider colon cancer screening - colonoscopy vs stool test - and let me know if desired for referral.  We will refer you to dermatology  Gerhardt Knudsen) Return as needed or in 1 year for next wellness visit /follow up visit.

## 2024-04-04 ENCOUNTER — Encounter: Payer: Self-pay | Admitting: Family Medicine

## 2024-04-11 NOTE — Progress Notes (Unsigned)
 PROVIDER NOTE: Interpretation of information contained herein should be left to medically-trained personnel. Specific patient instructions are provided elsewhere under "Patient Instructions" section of medical record. This document was created in part using AI and STT-dictation technology, any transcriptional errors that may result from this process are unintentional.  Patient: Larry Goodwin  Service: E/M   PCP: Claire Crick, MD  DOB: 1976/10/10  DOS: 04/12/2024  Provider: Cherylin Corrigan, NP  MRN: [id number]  Delivery: Face-to-face  Specialty: Interventional Pain Management  Type: Established Patient  Setting: Ambulatory outpatient facility  Specialty designation: 09  Referring Prov.: Claire Crick, MD  Location: Outpatient office facility       HPI  [name], a 48 y.o. year old male, is here today because of his No primary diagnosis found.. [name]'s primary complain today is No chief complaint on file.   Pain Assessment: Severity of   is reported as a  /10. Location:    / . Onset:  . Quality:  . Timing:  . Modifying factor(s):  Larry Goodwin Vitals:  vitals were not taken for this visit.  BMI: Estimated body mass index is 30.8 kg/m as calculated from the following:   Height as of 04/03/24: 6' 0.5" (1.842 m).   Weight as of 04/03/24: 230 lb 4 oz (104.4 kg). Last encounter: 01/19/2024.  Reason for encounter: medication management. ***  Discussed the use of AI scribe software for clinical note transcription with the patient, who gave verbal consent to proceed.  History of Present Illness           Pharmacotherapy Assessment  Analgesic: {There is no content from the last Subjective section.}   Monitoring: [place] PMP: PDMP not reviewed this encounter.       Pharmacotherapy: No side-effects or adverse reactions reported. Compliance: No problems identified. Effectiveness: Clinically acceptable.  No notes on file  No results found for: "CBDTHCR" No results found for:  "D8THCCBX" No results found for: "D9THCCBX"  UDS:  Summary  Date Value Ref Range Status  04/21/2023 Note  Final    Comment:    ==================================================================== [employer] Select [employer] (MW) ==================================================================== Test                             Result       Flag       Units  Drug Present and Declared for Prescription Verification   Oxycodone                       1247         EXPECTED   ng/mg creat   Oxymorphone                    779          EXPECTED   ng/mg creat   Noroxycodone                   1526         EXPECTED   ng/mg creat   Noroxymorphone                 358          EXPECTED   ng/mg creat    Sources of oxycodone  are scheduled prescription medications.    Oxymorphone, noroxycodone, and noroxymorphone are expected    metabolites of oxycodone . Oxymorphone is also available as a    scheduled prescription medication.  ====================================================================  Test                      Result    Flag   Units      Ref Range   Creatinine              43               mg/dL      >=56 ==================================================================== Declared Medications:  The flagging and interpretation on this report are based on the  following declared medications.  Unexpected results may arise from  inaccuracies in the declared medications.   **Note: The testing scope of this panel includes these medications:   Oxycodone    **Note: The testing scope of this panel does not include the  following reported medications:   Lisinopril  (Zestril )  Magnesium  (Mag-Ox)  Nystatin  (Mycostatin )  Sildenafil  (Viagra )  Vitamin B12  Vitamin D3 ==================================================================== For clinical consultation, please call [phone]. ====================================================================       ROS  Constitutional: Denies any  fever or chills Gastrointestinal: No reported hemesis, hematochezia, vomiting, or acute GI distress Musculoskeletal: Denies any acute onset joint swelling, redness, loss of ROM, or weakness Neurological: No reported episodes of acute onset apraxia, aphasia, dysarthria, agnosia, amnesia, paralysis, loss of coordination, or loss of consciousness  Medication Review  Magnesium  Oxide -Mg Supplement, Vitamin D3, cyanocobalamin , naloxone , nystatin  cream, oxyCODONE , sildenafil , and triamcinolone  cream  History Review  Allergy: Larry Goodwin is allergic to levaquin  [levofloxacin  in d5w]. Drug: Larry Goodwin  reports current drug use. Drug: Oxycodone . Alcohol:  reports no history of alcohol use. Tobacco:  reports that he quit smoking about 16 years ago. His smoking use included cigarettes. He has never used smokeless tobacco. Social: Larry Goodwin  reports that he quit smoking about 16 years ago. His smoking use included cigarettes. He has never used smokeless tobacco. He reports current drug use. Drug: Oxycodone . He reports that he does not drink alcohol. 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 Surgical: Larry Goodwin  has a past surgical history that includes Back surgery (2012) and Cholecystectomy. Family: family history includes COPD in his mother; Hypertension in an other family member.  Laboratory Chemistry Profile   Renal Lab Results  Component Value Date   BUN 21 04/03/2024   CREATININE 0.81 04/03/2024   BCR SEE NOTE: 07/02/2023   GFR 104.22 04/03/2024   GFRAA 129 02/06/2019   GFRNONAA 112 02/06/2019    Hepatic Lab Results  Component Value Date   AST 15 04/03/2024   ALT 14 04/03/2024   ALBUMIN 4.4 04/03/2024   ALKPHOS 76 04/03/2024   LIPASE 153 09/21/2012    Electrolytes Lab Results  Component  Value Date   NA 136 04/03/2024   K 4.8 04/03/2024   CL 99 04/03/2024   CALCIUM 9.0 04/03/2024   MG 1.8 04/22/2022    Bone Lab Results  Component Value Date   VD25OH 68.48 04/03/2024   25OHVITD1 35 02/06/2019   25OHVITD2 <1.0 02/06/2019   25OHVITD3 34 02/06/2019    Inflammation (CRP: Acute Phase) (ESR: Chronic Phase) Lab Results  Component Value Date   CRP 9 02/06/2019   ESRSEDRATE 6 02/06/2019         Note: Above Lab results reviewed.  Recent Imaging Review  DG Foot Complete Right CLINICAL DATA:  Jarring foot 3 days prior, no fall  EXAM: RIGHT FOOT COMPLETE - 3+ VIEW  COMPARISON:  Radiograph 03/24/2012  FINDINGS: No acute bony abnormality. Specifically, no fracture, subluxation, or dislocation. Mild degenerative changes are noted in the midfoot. Posterior calcaneal spur is present. Trace ankle effusion. Soft tissues are otherwise unremarkable  IMPRESSION: No acute fracture or traumatic malalignment.  Mild degenerative changes in the midfoot.  Posterior calcaneal spur.  Trace ankle effusion.  Electronically Signed   By: Ary Bitter M.D.   On: 02/12/2020 19:03 Note: Reviewed        Physical Exam  General appearance: Well nourished, well developed, and well hydrated. In no apparent acute distress Mental status: Alert, oriented x 3 (person, place, & time)       Respiratory: No evidence of acute respiratory distress Eyes: PERLA Vitals: There were no vitals taken for this visit. BMI: Estimated body mass index is 30.8 kg/m as calculated from the following:   Height as of 04/03/24: 6' 0.5" (1.842 m).   Weight as of 04/03/24: 230 lb 4 oz (104.4 kg). Ideal: Ideal body weight: 78.8 kg (173 lb 9.8 oz) Adjusted ideal body weight: 89 kg (196 lb 4.3 oz)  Assessment   Diagnosis Status  No diagnosis found. Controlled Controlled Controlled   Plan of Care  Assessment and Plan             Pharmacotherapy (Medications Ordered): No orders of the defined  types were placed in this encounter.  Orders:  No orders of the defined types were placed in this encounter.  Follow-up plan:   No follow-ups on file.     {There is no content from the last Plan section.}   Recent Visits Date Type Provider Dept  01/19/24 Office Visit Renaldo Caroli, MD [facility]  Showing recent visits within past 90 days and meeting all other requirements Future Appointments Date Type Provider Dept  04/12/24 Appointment Renaldo Caroli, MD [facility]  Showing future appointments within next 90 days and meeting all other requirements  I discussed the assessment and treatment plan with the patient. The patient was provided an opportunity to ask questions and all were answered. The patient agreed with the plan and demonstrated an understanding of the instructions.  Patient advised to call back or seek an in-person evaluation if the symptoms or condition worsens.  Duration of encounter: *** minutes.  Total time on encounter, as per AMA guidelines included both the face-to-face and non-face-to-face time personally spent by the physician and/or other qualified health care professional(s) on the day of the encounter (includes time in activities that require the physician or other qualified health care professional and does not include time in activities normally performed by clinical staff). Physician's time may include the following activities when performed: Preparing to see the patient (e.g., pre-charting review of records, searching for previously ordered imaging, lab work, and nerve conduction tests) Review of prior analgesic pharmacotherapies. Reviewing PMP Interpreting ordered tests (e.g., lab work, imaging, nerve conduction tests) Performing post-procedure evaluations, including interpretation of diagnostic procedures Obtaining and/or reviewing separately obtained history Performing a medically appropriate examination and/or  evaluation Counseling and educating the patient/family/caregiver Ordering medications, tests, or procedures Referring and communicating with other health care professionals (when not separately reported) Documenting clinical information in the electronic or other health record Independently interpreting results (not separately reported) and communicating results to the patient/ family/caregiver Care coordination (not separately reported)  Note by: Danyeal Akens K Andres Escandon, NP (TTS and AI technology used. I apologize for any typographical errors that were  not detected and corrected.) Date: 04/12/2024; Time: 9:49 AM

## 2024-04-12 ENCOUNTER — Encounter: Payer: Self-pay | Admitting: Pain Medicine

## 2024-04-12 ENCOUNTER — Ambulatory Visit: Payer: Medicare Other | Attending: Pain Medicine | Admitting: Pain Medicine

## 2024-04-12 DIAGNOSIS — M25561 Pain in right knee: Secondary | ICD-10-CM | POA: Diagnosis present

## 2024-04-12 DIAGNOSIS — G894 Chronic pain syndrome: Secondary | ICD-10-CM | POA: Diagnosis present

## 2024-04-12 DIAGNOSIS — M533 Sacrococcygeal disorders, not elsewhere classified: Secondary | ICD-10-CM

## 2024-04-12 DIAGNOSIS — Z79899 Other long term (current) drug therapy: Secondary | ICD-10-CM

## 2024-04-12 DIAGNOSIS — G8929 Other chronic pain: Secondary | ICD-10-CM | POA: Insufficient documentation

## 2024-04-12 DIAGNOSIS — Z79891 Long term (current) use of opiate analgesic: Secondary | ICD-10-CM | POA: Diagnosis present

## 2024-04-12 DIAGNOSIS — M25551 Pain in right hip: Secondary | ICD-10-CM | POA: Diagnosis present

## 2024-04-12 DIAGNOSIS — M25511 Pain in right shoulder: Secondary | ICD-10-CM | POA: Diagnosis present

## 2024-04-12 DIAGNOSIS — M549 Dorsalgia, unspecified: Secondary | ICD-10-CM | POA: Diagnosis present

## 2024-04-12 DIAGNOSIS — M25512 Pain in left shoulder: Secondary | ICD-10-CM | POA: Diagnosis present

## 2024-04-12 DIAGNOSIS — M545 Low back pain, unspecified: Secondary | ICD-10-CM

## 2024-04-12 MED ORDER — OXYCODONE HCL 5 MG PO TABS: 5.0000 mg | ORAL_TABLET | Freq: Four times a day (QID) | ORAL | 0 refills | Status: DC | PRN

## 2024-04-12 MED ORDER — OXYCODONE HCL 5 MG PO TABS
5.0000 mg | ORAL_TABLET | Freq: Four times a day (QID) | ORAL | 0 refills | Status: DC | PRN
Start: 2024-06-21 — End: 2024-07-20

## 2024-04-12 NOTE — Patient Instructions (Signed)
Medication Rules  Applies to: All patients receiving prescriptions (written or electronic).  Pharmacy of record: Pharmacy where electronic prescriptions will be sent. If written prescriptions are taken to a different pharmacy, please inform the nursing staff. The pharmacy listed in the electronic medical record should be the one where you would like electronic prescriptions to be sent.  Prescription refills: Only during scheduled appointments. Applies to both, written and electronic prescriptions.  NOTE: The following applies primarily to controlled substances (Opioid* Pain Medications).   Patient's responsibilities: Pain Pills: Bring all pain pills to every appointment (except for procedure appointments). Pill Bottles: Bring pills in original pharmacy bottle. Always bring newest bottle. Bring bottle, even if empty. Medication refills: You are responsible for knowing and keeping track of what medications you need refilled. The day before your appointment, write a list of all prescriptions that need to be refilled. Bring that list to your appointment and give it to the admitting nurse. Prescriptions will be written only during appointments. If you forget a medication, it will not be "Called in", "Faxed", or "electronically sent". You will need to get another appointment to get these prescribed. Prescription Accuracy: You are responsible for carefully inspecting your prescriptions before leaving our office. Have the discharge nurse carefully go over each prescription with you, before taking them home. Make sure that your name is accurately spelled, that your address is correct. Check the name and dose of your medication to make sure it is accurate. Check the number of pills, and the written instructions to make sure they are clear and accurate. Make sure that you are given enough medication to last until your next medication refill appointment. Taking Medication: Take medication as prescribed. Never  take more pills than instructed. Never take medication more frequently than prescribed. Taking less pills or less frequently is permitted and encouraged, when it comes to controlled substances (written prescriptions).  Inform other Doctors: Always inform, all of your healthcare providers, of all the medications you take. Pain Medication from other Providers: You are not allowed to accept any additional pain medication from any other Doctor or Healthcare provider. There are two exceptions to this rule. (see below) In the event that you require additional pain medication, you are responsible for notifying us, as stated below. Medication Agreement: You are responsible for carefully reading and following our Medication Agreement. This must be signed before receiving any prescriptions from our practice. Safely store a copy of your signed Agreement. Violations to the Agreement will result in no further prescriptions. (Additional copies of our Medication Agreement are available upon request.) Laws, Rules, & Regulations: All patients are expected to follow all Federal and State Laws, Statutes, Rules, & Regulations. Ignorance of the Laws does not constitute a valid excuse. The use of any illegal substances is prohibited. Adopted CDC guidelines & recommendations: Target dosing levels will be at or below 60 MME/day. Use of benzodiazepines** is not recommended.  Exceptions: There are only two exceptions to the rule of not receiving pain medications from other Healthcare Providers. Exception #1 (Emergencies): In the event of an emergency (i.e.: accident requiring emergency care), you are allowed to receive additional pain medication. However, you are responsible for: As soon as you are able, call our office [phone], at any time of the day or night, and leave a message stating your name, the date and nature of the emergency, and the name and dose of the medication prescribed. In the event that your call is answered  by a member of   our staff, make sure to document and save the date, time, and the name of the person that took your information.  Exception #2 (Planned Surgery): In the event that you are scheduled by another doctor or dentist to have any type of surgery or procedure, you are allowed (for a period no longer than 30 days), to receive additional pain medication, for the acute post-op pain. However, in this case, you are responsible for picking up a copy of our "Post-op Pain Management for Surgeons" handout, and giving it to your surgeon or dentist. This document is available at our office, and does not require an appointment to obtain it. Simply go to our office during business hours (Monday-Thursday from 8:00 AM to 4:00 PM) (Friday 8:00 AM to 12:00 Noon) or if you have a scheduled appointment with us, prior to your surgery, and ask for it by name. In addition, you will need to provide us with your name, name of your surgeon, type of surgery, and date of procedure or surgery.  *Opioid medications include: morphine, codeine, oxycodone, oxymorphone, hydrocodone, hydromorphone, meperidine, tramadol, tapentadol, buprenorphine, fentanyl, methadone. **Benzodiazepine medications include: diazepam (Valium), alprazolam (Xanax), clonazepam (Klonopine), lorazepam (Ativan), clorazepate (Tranxene), chlordiazepoxide (Librium), estazolam (Prosom), oxazepam (Serax), temazepam (Restoril), triazolam (Halcion) (Last updated: 02/10/2018)  

## 2024-04-12 NOTE — Progress Notes (Signed)
 Nursing Pain Medication Assessment:  Safety precautions to be maintained throughout the outpatient stay will include: orient to surroundings, keep bed in low position, maintain call bell within reach at all times, provide assistance with transfer out of bed and ambulation.  Medication Inspection Compliance: Pill count conducted under aseptic conditions, in front of the patient. Neither the pills nor the bottle was removed from the patient's sight at any time. Once count was completed pills were immediately returned to the patient in their original bottle.  Medication: Oxycodone  IR Pill/Patch Count:  39 of 120 pills remain Pill/Patch Appearance: Markings consistent with prescribed medication Bottle Appearance: Standard pharmacy container. Clearly labeled. Filled Date: 04 / 10 / 2025 Last Medication intake:  Today

## 2024-04-15 LAB — TOXASSURE SELECT 13 (MW), URINE

## 2024-07-14 ENCOUNTER — Encounter: Payer: Self-pay | Admitting: Family Medicine

## 2024-07-14 ENCOUNTER — Ambulatory Visit: Payer: Self-pay

## 2024-07-14 ENCOUNTER — Telehealth (INDEPENDENT_AMBULATORY_CARE_PROVIDER_SITE_OTHER): Admitting: Family Medicine

## 2024-07-14 VITALS — Temp 99.1°F | Wt 230.0 lb

## 2024-07-14 DIAGNOSIS — J019 Acute sinusitis, unspecified: Secondary | ICD-10-CM | POA: Diagnosis not present

## 2024-07-14 MED ORDER — AMOXICILLIN-POT CLAVULANATE 875-125 MG PO TABS: 1.0000 | ORAL_TABLET | Freq: Two times a day (BID) | ORAL | 0 refills | Status: AC

## 2024-07-14 NOTE — Telephone Encounter (Signed)
 Patient is on scheduled for today at 4:30 pm.

## 2024-07-14 NOTE — Progress Notes (Signed)
 Ph: [phone] Fax: [phone]   Patient ID: Larry Goodwin, male    DOB: [birth year], 48 y.o.   MRN: [id number]  Virtual visit completed through MyChart, a video enabled telemedicine application. Due to national recommendations of social distancing due to COVID-19, a virtual visit is felt to be most appropriate for this patient at this time. Reviewed limitations, risks, security and privacy concerns of performing a virtual visit and the availability of in person appointments. I also reviewed that there may be a patient responsible charge related to this service. The patient agreed to proceed.   Patient location: home Provider location: [place] [facility], office Persons participating in this virtual visit: patient, provider   If any vitals were documented, they were collected by patient at home unless specified below.    Temp 99.1 F (37.3 C) (Oral)   Wt 230 lb (104.3 kg)   BMI 29.53 kg/m    CC: sinusitis  Subjective:   HPI: Larry Goodwin is a 48 y.o. male presenting on 07/14/2024 for Sinusitis (Sxs started about a week ago, facial pressure, nasal congestion, dark green mucous, lightheaded )   H/o chronic sinus congestion and drainage - that actually improved with weight loss.   1+ wk h/o nasal sinus congestion that has not been improving. Last night head congestion worsened - leading to sinus pressure pain, lightheadedness, thick colored sputum when blowing nose. Last night had chills. Bilateral maxillary and frontal sinus pressure into occiput.   Treating with increased fluids/water, steam showers.  Mucinex  hasn't helped.  Has not tried nasal steroid.   Lab Results  Component Value Date   HGBA1C 5.3 04/03/2024        Relevant past medical, surgical, family and social history reviewed and updated as indicated. Interim medical history since our last visit reviewed. Allergies and medications reviewed and updated. Outpatient Medications Prior to Visit   Medication Sig Dispense Refill   Cholecalciferol (VITAMIN D3) 125 MCG (5000 UT) CAPS Take 1 capsule (5,000 Units total) by mouth in the morning and at bedtime.     Magnesium  Oxide 500 MG CAPS Take 1 capsule (500 mg total) by mouth daily. 30 capsule 2   oxyCODONE  (OXY IR/ROXICODONE ) 5 MG immediate release tablet Take 1 tablet (5 mg total) by mouth every 6 (six) hours as needed for severe pain (pain score 7-10). Must last 30 days 120 tablet 0   oxyCODONE  (OXY IR/ROXICODONE ) 5 MG immediate release tablet Take 1 tablet (5 mg total) by mouth every 6 (six) hours as needed for severe pain (pain score 7-10). Must last 30 days 120 tablet 0   oxyCODONE  (OXY IR/ROXICODONE ) 5 MG immediate release tablet Take 1 tablet (5 mg total) by mouth every 6 (six) hours as needed for severe pain (pain score 7-10). Must last 30 days 120 tablet 0   sildenafil  (VIAGRA ) 100 MG tablet Take 0.5-1 tablets (50-100 mg total) by mouth daily as needed for erectile dysfunction. 10 tablet 11   naloxone  (NARCAN ) nasal spray 4 mg/0.1 mL Place 1 spray into the nose as needed for up to 365 doses (for opioid-induced respiratory depresssion). In case of emergency (overdose), spray once into each nostril. If no response within 3 minutes, repeat application and call 911. 1 each 0   nystatin  cream (MYCOSTATIN ) APPLY AS DIRECTED 2 TIMES DAILY 30 g 0   triamcinolone  cream (KENALOG ) 0.1 % Apply 1 Application topically 2 (two) times daily. (Patient taking differently: Apply 1 Application topically 2 (two) times daily.  PRN) 30 g 0   vitamin B-12 (CYANOCOBALAMIN ) 1000 MCG tablet Take 1,000 mcg by mouth daily.     No facility-administered medications prior to visit.     Per HPI unless specifically indicated in ROS section below Review of Systems Objective:  Temp 99.1 F (37.3 C) (Oral)   Wt 230 lb (104.3 kg)   BMI 29.53 kg/m   Wt Readings from Last 3 Encounters:  07/14/24 230 lb (104.3 kg)  04/12/24 230 lb (104.3 kg)  04/03/24 230 lb 4 oz  (104.4 kg)       Physical exam: Gen: alert, NAD, not ill appearing Pulm: speaks in complete sentences without increased work of breathing Psych: normal mood, normal thought content      Assessment & Plan:   Acute non-recurrent sinusitis, unspecified location Assessment & Plan: Treat for bacterial sinusitis given duration/progression of symptoms in h/o recurrent and chronic sinusitis   Other orders -     Amoxicillin -Pot Clavulanate; Take 1 tablet by mouth 2 (two) times daily for 10 days.  Dispense: 20 tablet; Refill: 0     I discussed the assessment and treatment plan with the patient. The patient was provided an opportunity to ask questions and all were answered. The patient agreed with the plan and demonstrated an understanding of the instructions. The patient was advised to call back or seek an in-person evaluation if the symptoms worsen or if the condition fails to improve as anticipated.  Follow up plan: No follow-ups on file.  Anton Blas, MD

## 2024-07-14 NOTE — Telephone Encounter (Signed)
 FYI Only or Action Required?: Action required by provider: request for appointment.  Patient was last seen in primary care on 04/03/2024 by Rilla Baller, MD.  Called Nurse Triage reporting Sinusitis.  Symptoms began a week ago.  Interventions attempted: Nothing.  Symptoms are: gradually worsening.  Triage Disposition: See PCP When Office is Open (Within 3 Days)  Patient/caregiver understands and will follow disposition?: YesCopied from CRM #[id number]. Topic: Clinical - Red Word Triage >> Jul 14, 2024  8:03 AM Elle L wrote: Red Word that prompted transfer to Nurse Triage: The patient states he may have a sinus infection. It started over a week ago but it is worsening. He has a sore throat, muscle soreness, head congestion, and discolored mucous. Reason for Disposition  [1] Sinus congestion (pressure, fullness) AND [2] present > 10 days  Answer Assessment - Initial Assessment Questions Please put pt on cancellation list for today. Pt is requesting virtual appt due to leaving 1.5 hours away and would like medication to be called in for today. Appt schedule for Monday. Pt has not tried any OTC medication. Pt won't go to [facility].       1. LOCATION: Where does it hurt?      Front and back of head 2. ONSET: When did the sinus pain start?  (e.g., hours, days)      1 week ago 3. SEVERITY: How bad is the pain?   (Scale 0-10; or none, mild, moderate or severe)     3 4. RECURRENT SYMPTOM: Have you ever had sinus problems before? If Yes, ask: When was the last time? and What happened that time?      Several years ago  5. NASAL CONGESTION: Is the nose blocked? If Yes, ask: Can you open it or must you breathe through your mouth?     Yes blocked 6. NASAL DISCHARGE: Do you have discharge from your nose? If so ask, What color?    green 7. FEVER: Do you have a fever? If Yes, ask: What is it, how was it measured, and when did it start?      Not sure 8. OTHER SYMPTOMS: Do  you have any other symptoms? (e.g., sore throat, cough, earache, difficulty breathing)     Dizzy, body aches, sore throat  Protocols used: Sinus Pain or Congestion-A-AH

## 2024-07-14 NOTE — Assessment & Plan Note (Addendum)
 Treat for bacterial sinusitis given duration/progression of symptoms in h/o recurrent and chronic sinusitis

## 2024-07-14 NOTE — Telephone Encounter (Signed)
 Per PCP appt scheduled today at 4:30

## 2024-07-14 NOTE — Telephone Encounter (Signed)
 May place virtually at 4:30pm

## 2024-07-17 ENCOUNTER — Ambulatory Visit: Admitting: Internal Medicine

## 2024-07-20 ENCOUNTER — Ambulatory Visit: Attending: Nurse Practitioner | Admitting: Nurse Practitioner

## 2024-07-20 ENCOUNTER — Encounter: Payer: Self-pay | Admitting: Nurse Practitioner

## 2024-07-20 DIAGNOSIS — M533 Sacrococcygeal disorders, not elsewhere classified: Secondary | ICD-10-CM | POA: Insufficient documentation

## 2024-07-20 DIAGNOSIS — Z79899 Other long term (current) drug therapy: Secondary | ICD-10-CM | POA: Insufficient documentation

## 2024-07-20 DIAGNOSIS — Z79891 Long term (current) use of opiate analgesic: Secondary | ICD-10-CM | POA: Diagnosis present

## 2024-07-20 DIAGNOSIS — M25561 Pain in right knee: Secondary | ICD-10-CM | POA: Diagnosis present

## 2024-07-20 DIAGNOSIS — M25511 Pain in right shoulder: Secondary | ICD-10-CM | POA: Insufficient documentation

## 2024-07-20 DIAGNOSIS — G894 Chronic pain syndrome: Secondary | ICD-10-CM | POA: Diagnosis present

## 2024-07-20 DIAGNOSIS — G8929 Other chronic pain: Secondary | ICD-10-CM | POA: Diagnosis present

## 2024-07-20 DIAGNOSIS — M545 Low back pain, unspecified: Secondary | ICD-10-CM | POA: Insufficient documentation

## 2024-07-20 DIAGNOSIS — M549 Dorsalgia, unspecified: Secondary | ICD-10-CM | POA: Diagnosis present

## 2024-07-20 DIAGNOSIS — M25551 Pain in right hip: Secondary | ICD-10-CM | POA: Insufficient documentation

## 2024-07-20 DIAGNOSIS — M25512 Pain in left shoulder: Secondary | ICD-10-CM | POA: Diagnosis present

## 2024-07-20 MED ORDER — OXYCODONE HCL 5 MG PO TABS: 5.0000 mg | ORAL_TABLET | Freq: Four times a day (QID) | ORAL | 0 refills | Status: DC | PRN

## 2024-07-20 NOTE — Progress Notes (Signed)
 Nursing Pain Medication Assessment:  Safety precautions to be maintained throughout the outpatient stay will include: orient to surroundings, keep bed in low position, maintain call bell within reach at all times, provide assistance with transfer out of bed and ambulation.  Medication Inspection Compliance: Pill count conducted under aseptic conditions, in front of the patient. Neither the pills nor the bottle was removed from the patient's sight at any time. Once count was completed pills were immediately returned to the patient in their original bottle.  Medication: Oxycodone  IR Pill/Patch Count: 3 of 120 pills/patches remain Pill/Patch Appearance: Markings consistent with prescribed medication Bottle Appearance: Standard pharmacy container. Clearly labeled. Filled Date: 7 / 82 / 2025 Last Medication intake:  Today

## 2024-07-20 NOTE — Progress Notes (Signed)
 PROVIDER NOTE: Interpretation of information contained herein should be left to medically-trained personnel. Specific patient instructions are provided elsewhere under Patient Instructions section of medical record. This document was created in part using AI and STT-dictation technology, any transcriptional errors that may result from this process are unintentional.  Patient: Larry Goodwin  Service: E/M   PCP: Rilla Baller, MD  DOB: 1976/03/01  DOS: 07/20/2024  Provider: Emmy MARLA Blanch, NP  MRN: [id number]  Delivery: Face-to-face  Specialty: Interventional Pain Management  Type: Established Patient  Setting: Ambulatory outpatient facility  Specialty designation: 09  Referring Prov.: Rilla Baller, MD  Location: Outpatient office facility       History of present illness (HPI) [name], a 48 y.o. year old male, is here today because of his No primary diagnosis found.. [name] primary complain today is Back Pain (lower)  Pertinent problems: Larry Goodwin has lumbar DDD (degenerative disc disease); myofascial pain syndrome; lumbar facet syndrome (bilateral) (R>L); chronic upper back pain (3ry area of Pain); Lumbar spondylosis with radiculopathy; Epidural fibrosis; Failed back surgical syndrome; Chronic low back pain (1ry area of Pain) (Bilateral) (R>L); Chronic sacroiliac joint pain (Right); and chronic pain syndrome on their pertinent problem list.  Pain Assessment: Severity of Chronic pain is reported as a 3 /10. Location: Back Lower/to right buttock. Onset: More than a month ago. Quality: Goodwin. Timing: Goodwin. Modifying factor(s): laying down, meds. Vitals:  height is 6' 2 (1.88 m) and weight is 230 lb (104.3 kg). His temperature is 97.4 F (36.3 C) (abnormal). His blood pressure is 134/96 (abnormal) and his pulse is 65. His respiration is 16 and oxygen saturation is 100%.  BMI: Estimated body mass index is 29.53 kg/m as calculated from the following:   Height as  of this encounter: 6' 2 (1.88 m).   Weight as of this encounter: 230 lb (104.3 kg).  Last encounter: 04/12/2024 Last procedure: Visit date not found.  Reason for encounter: medication management. The patient indicates doing well with the current medication regimen.  He denies any adverse reaction or side effects to the medication.  The patient complains of upper to mid back pain extending down to the lower back bilaterally, with radiation to the right buttock.  I discussed interventional therapy with him as a potential option for future pain relief, if he is interested.   Pharmacotherapy Assessment   Oxycodone  (Oxy IR/Roxicodone ) 5 mg immediate release tablet every 6 hours as needed for severe pain. MME=30 Monitoring: [place] PMP: PDMP reviewed during this encounter.       Pharmacotherapy: No side-effects or adverse reactions reported. Compliance: No problems identified.  Low back pain Effectiveness: Clinically acceptable.  Dayna Pulling, RN  07/20/2024  9:38 AM  Sign when Signing Visit Nursing Pain Medication Assessment:  Safety precautions to be maintained throughout the outpatient stay will include: orient to surroundings, keep bed in low position, maintain call bell within reach at all times, provide assistance with transfer out of bed and ambulation.  Medication Inspection Compliance: Pill count conducted under aseptic conditions, in front of the patient. Neither the pills nor the bottle was removed from the patient's sight at any time. Once count was completed pills were immediately returned to the patient in their original bottle.  Medication: Oxycodone  IR Pill/Patch Count: 3 of 120 pills/patches remain Pill/Patch Appearance: Markings consistent with prescribed medication Bottle Appearance: Standard pharmacy container. Clearly labeled. Filled Date: 7 / 55 / 2025 Last Medication intake:  Today  UDS:  Summary  Date Value Ref Range Status  04/12/2024 FINAL  Final    Comment:     ==================================================================== [employer] Select 13 (MW) ==================================================================== Test                             Result       Flag       Units  Drug Present and Declared for Prescription Verification   Oxycodone                       1878         EXPECTED   ng/mg creat   Oxymorphone                    517          EXPECTED   ng/mg creat   Noroxycodone                   3291         EXPECTED   ng/mg creat   Noroxymorphone                 339          EXPECTED   ng/mg creat    Sources of oxycodone  are scheduled prescription medications.    Oxymorphone, noroxycodone, and noroxymorphone are expected    metabolites of oxycodone . Oxymorphone is also available as a    scheduled prescription medication.  ==================================================================== Test                      Result    Flag   Units      Ref Range   Creatinine              23               mg/dL      >=79 ==================================================================== Declared Medications:  The flagging and interpretation on this report are based on the  following declared medications.  Unexpected results may arise from  inaccuracies in the declared medications.   **Note: The testing scope of this panel includes these medications:   Oxycodone  (Roxicodone )   **Note: The testing scope of this panel does not include the  following reported medications:   Magnesium  (Mag-Ox)  Naloxone  (Narcan )  Nystatin  (Mycostatin )  Sildenafil  (Viagra )  Triamcinolone  (Kenalog )  Vitamin B12  Vitamin D3 ==================================================================== For clinical consultation, please call [phone]. ====================================================================     No results found for: CBDTHCR No results found for: D8THCCBX No results found for: D9THCCBX  ROS  Constitutional: Denies any  fever or chills Gastrointestinal: No reported hemesis, hematochezia, vomiting, or acute GI distress Musculoskeletal: Low back pain Neurological: No reported episodes of acute onset apraxia, aphasia, dysarthria, agnosia, amnesia, paralysis, loss of coordination, or loss of consciousness  Medication Review  Magnesium  Oxide -Mg Supplement, Vitamin D3, amoxicillin -clavulanate, oxyCODONE , and sildenafil   History Review  Allergy: Larry Goodwin is allergic to levaquin  [levofloxacin  in d5w]. Drug: Larry Goodwin  reports current drug use. Drug: Oxycodone . Alcohol:  reports no history of alcohol use. Tobacco:  reports that he quit smoking about 16 years ago. His smoking use included cigarettes. He has never used smokeless tobacco. Social: Larry Goodwin  reports that he quit smoking about 16 years ago. His smoking use included cigarettes. He has never used smokeless tobacco. He reports current drug use. Drug: Oxycodone . He reports that he does not drink alcohol. 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 Surgical: Larry Goodwin  has a past surgical history that includes Back surgery (12/14/2010); Cholecystectomy; and Spine surgery (2014). Family: family history includes Arthritis in his mother; COPD in his mother; Hypertension in an other family member; Obesity in his mother.  Laboratory Chemistry Profile   Renal Lab Results  Component Value Date   BUN 21 04/03/2024   CREATININE 0.81 04/03/2024   BCR SEE NOTE: 07/02/2023   GFR 104.22 04/03/2024   GFRAA 129 02/06/2019   GFRNONAA 112 02/06/2019    Hepatic Lab Results  Component Value Date   AST 15 04/03/2024   ALT 14 04/03/2024   ALBUMIN 4.4 04/03/2024   ALKPHOS 76 04/03/2024   LIPASE 153 09/21/2012    Electrolytes Lab Results   Component Value Date   NA 136 04/03/2024   K 4.8 04/03/2024   CL 99 04/03/2024   CALCIUM 9.0 04/03/2024   MG 1.8 04/22/2022    Bone Lab Results  Component Value Date   VD25OH 68.48 04/03/2024   25OHVITD1 35 02/06/2019   25OHVITD2 <1.0 02/06/2019   25OHVITD3 34 02/06/2019    Inflammation (CRP: Acute Phase) (ESR: Chronic Phase) Lab Results  Component Value Date   CRP 9 02/06/2019   ESRSEDRATE 6 02/06/2019         Note: Above Lab results reviewed.  Recent Imaging Review  DG Foot Complete Right CLINICAL DATA:  Jarring foot 3 days prior, no fall  EXAM: RIGHT FOOT COMPLETE - 3+ VIEW  COMPARISON:  Radiograph 03/24/2012  FINDINGS: No acute bony abnormality. Specifically, no fracture, subluxation, or dislocation. Mild degenerative changes are noted in the midfoot. Posterior calcaneal spur is present. Trace ankle effusion. Soft tissues are otherwise unremarkable  IMPRESSION: No acute fracture or traumatic malalignment.  Mild degenerative changes in the midfoot.  Posterior calcaneal spur.  Trace ankle effusion.  Electronically Signed   By: Gretel Edis M.D.   On: 02/12/2020 19:03 Note: Reviewed        Physical Exam  Vitals: BP (!) 134/96   Pulse 65   Temp (!) 97.4 F (36.3 C)   Resp 16   Ht 6' 2 (1.88 m)   Wt 230 lb (104.3 kg)   SpO2 100%   BMI 29.53 kg/m  BMI: Estimated body mass index is 29.53 kg/m as calculated from the following:   Height as of this encounter: 6' 2 (1.88 m).   Weight as of this encounter: 230 lb (104.3 kg). Ideal: Ideal body weight: 82.2 kg (181 lb 3.5 oz) Adjusted ideal body weight: 91.1 kg (200 lb 11.7 oz) General appearance: Well nourished, well developed, and well hydrated. In no apparent acute distress Mental status: Alert, oriented x 3 (person, place, & time)       Respiratory: No evidence of acute respiratory distress Eyes: PERLA   Assessment   Diagnosis Status  1. Chronic low back pain (1ry area of Pain) (Bilateral)  (R>L)   2. Chronic hip pain (2ry area of Pain) (Right)   3. Chronic upper back pain (3ry area of Pain)   4. Chronic shoulder pain (4th area of Pain) (Bilateral) (Right)   5. Chronic sacroiliac joint pain (Right)   6. Chronic knee pain (Right)   7. Chronic pain syndrome   8. Pharmacologic therapy   9. Chronic use of opiate for therapeutic purpose  10. Encounter for medication management   11. Encounter for chronic pain management    Controlled Controlled Controlled   Updated Problems: No problems updated.  Plan of Care  Problem-specific:  Assessment and Plan We will continue on current medication regimen.  Prescribing drug monitoring (PDMP) reviewed; findings consistent with the use of prescribed medication and no evidence of narcotic misuse or abuse.  Urine drug screening (UDS) up-to-date.  Schedule follow-up in 90 days for medication management.  [name] has a current medication list which includes the following long-term medication(s): magnesium  oxide -mg supplement, oxycodone , and sildenafil .  Pharmacotherapy (Medications Ordered): No orders of the defined types were placed in this encounter.  Orders:  No orders of the defined types were placed in this encounter.       No follow-ups on file.    Recent Visits No visits were found meeting these conditions. Showing recent visits within past 90 days and meeting all other requirements Today's Visits Date Type Provider Dept  07/20/24 Office Visit Illyana Schorsch K, NP [facility]  Showing today's visits and meeting all other requirements Future Appointments No visits were found meeting these conditions. Showing future appointments within next 90 days and meeting all other requirements  I discussed the assessment and treatment plan with the patient. The patient was provided an opportunity to ask questions and all were answered. The patient agreed with the plan and demonstrated an understanding of the  instructions.  Patient advised to call back or seek an in-person evaluation if the symptoms or condition worsens.  Duration of encounter: 30 minutes.  Total time on encounter, as per AMA guidelines included both the face-to-face and non-face-to-face time personally spent by the physician and/or other qualified health care professional(s) on the day of the encounter (includes time in activities that require the physician or other qualified health care professional and does not include time in activities normally performed by clinical staff). Physician's time may include the following activities when performed: Preparing to see the patient (e.g., pre-charting review of records, searching for previously ordered imaging, lab work, and nerve conduction tests) Review of prior analgesic pharmacotherapies. Reviewing PMP Interpreting ordered tests (e.g., lab work, imaging, nerve conduction tests) Performing post-procedure evaluations, including interpretation of diagnostic procedures Obtaining and/or reviewing separately obtained history Performing a medically appropriate examination and/or evaluation Counseling and educating the patient/family/caregiver Ordering medications, tests, or procedures Referring and communicating with other health care professionals (when not separately reported) Documenting clinical information in the electronic or other health record Independently interpreting results (not separately reported) and communicating results to the patient/ family/caregiver Care coordination (not separately reported)  Note by: Myson Levi K Herson Prichard, NP (TTS and AI technology used. I apologize for any typographical errors that were not detected and corrected.) Date: 07/20/2024; Time: 9:51 AM

## 2024-08-02 ENCOUNTER — Encounter: Payer: Self-pay | Admitting: Family Medicine

## 2024-08-04 MED ORDER — AMOXICILLIN-POT CLAVULANATE 875-125 MG PO TABS: 1.0000 | ORAL_TABLET | Freq: Two times a day (BID) | ORAL | 0 refills | Status: AC

## 2024-09-04 ENCOUNTER — Ambulatory Visit: Payer: Self-pay

## 2024-09-04 NOTE — Telephone Encounter (Signed)
 FYI Only or Action Required?: FYI only for provider. Recommended to the ED  Patient was last seen in primary care on 07/14/2024 by Rilla Baller, MD.  Called Nurse Triage reporting Heartburn.  Symptoms began yesterday.  Interventions attempted: OTC medications: antacids.  Symptoms are: gradually worsening.  Triage Disposition: Go to ED Now (or PCP Triage)  Patient/caregiver understands and will follow disposition?: Yes  Reason for Disposition  [1] Chest pain lasts > 5 minutes AND [2] occurred in past 3 days (72 hours) (Exception: Feels exactly the same as previously diagnosed heartburn and has accompanying sour taste in mouth.)  Answer Assessment - Initial Assessment Questions Patient transferred from [facility] office-patient reports heart burn that started early this morning. Patient states he woke up out of sleep due to the heartburn. Patient states the intensity of the heartburn with wax and wan but has never gone away. Reports taking 2 antacids but no real relief. Patient is recommended to get checked out in the ED. Patient verbalized understanding and states he will get checked out.   1. LOCATION: Where does it hurt?       Top of abdomen into sternum 2. RADIATION: Does the pain go anywhere else? (e.g., into neck, jaw, arms, back)     No radiation 3. ONSET: When did the chest pain begin? (Minutes, hours or days)      Started early this morning-woke up out of sleep 4. PATTERN: Does the pain come and go, or has it been constant since it started?  Does it get worse with exertion?      constant 5. DURATION: How long does it last (e.g., seconds, minutes, hours)     Has remained since it started.  6. SEVERITY: How bad is the pain?  (e.g., Scale 1-10; mild, moderate, or severe)     3 out of 10 7. CARDIAC RISK FACTORS: Do you have any history of heart problems or risk factors for heart disease? (e.g., angina, prior heart attack; diabetes, high blood pressure, high  cholesterol, smoker, or strong family history of heart disease)     HTN, DM 8. PULMONARY RISK FACTORS: Do you have any history of lung disease?  (e.g., blood clots in lung, asthma, emphysema, birth control pills)     no 9. CAUSE: What do you think is causing the chest pain?     Felt like heartburn but patient is unsure 10. OTHER SYMPTOMS: Do you have any other symptoms? (e.g., dizziness, nausea, vomiting, sweating, fever, difficulty breathing, cough)       Increased stress,  Protocols used: Chest Pain-A-AH

## 2024-09-05 LAB — LAB REPORT - SCANNED: A1c: 5.2

## 2024-09-06 NOTE — Telephone Encounter (Signed)
 Seen at ER yesterday - dx NSTEMI pending heart catheterization at [facility].

## 2024-09-13 ENCOUNTER — Encounter: Payer: Self-pay | Admitting: Family Medicine

## 2024-09-13 ENCOUNTER — Ambulatory Visit (INDEPENDENT_AMBULATORY_CARE_PROVIDER_SITE_OTHER): Admitting: Family Medicine

## 2024-09-13 VITALS — BP 124/74 | HR 66 | Temp 97.8°F | Ht 74.0 in | Wt 256.4 lb

## 2024-09-13 DIAGNOSIS — Z8639 Personal history of other endocrine, nutritional and metabolic disease: Secondary | ICD-10-CM

## 2024-09-13 DIAGNOSIS — I214 Non-ST elevation (NSTEMI) myocardial infarction: Secondary | ICD-10-CM | POA: Diagnosis not present

## 2024-09-13 DIAGNOSIS — E66811 Obesity, class 1: Secondary | ICD-10-CM

## 2024-09-13 DIAGNOSIS — R1013 Epigastric pain: Secondary | ICD-10-CM | POA: Diagnosis not present

## 2024-09-13 MED ORDER — ATORVASTATIN CALCIUM 40 MG PO TABS: 40.0000 mg | ORAL_TABLET | Freq: Every day | ORAL | 2 refills | Status: DC

## 2024-09-13 MED ORDER — OMEPRAZOLE 20 MG PO CPDR: 20.0000 mg | DELAYED_RELEASE_CAPSULE | Freq: Every day | ORAL | 3 refills | Status: DC

## 2024-09-13 NOTE — Patient Instructions (Addendum)
 Continue current medicines including aspirin and atorvastatin.  Check into mediterranean diet (see handout) Good to see you today Will await cardiology eval next month.

## 2024-09-13 NOTE — Progress Notes (Signed)
 Ph: [phone] Fax: [phone]   Patient ID: Larry Goodwin, male    DOB: 24-Aug-1976, 48 y.o.   MRN: [id number]  This visit was conducted in person.  BP 124/74   Pulse 66   Temp 97.8 F (36.6 C) (Oral)   Ht 6' 2 (1.88 m)   Wt 256 lb 6 oz (116.3 kg)   SpO2 98%   BMI 32.92 kg/m   BP Readings from Last 3 Encounters:  09/13/24 124/74  07/20/24 (!) 134/96  04/12/24 (!) 129/92   CC: hosp f/u visit , reflux Subjective:   HPI: Larry Goodwin is a 48 y.o. male presenting on 09/13/2024 for Hospitalization Follow-up (Admitted on 09/05/24 at [facility], dx NSTEMI. Pt accompanied by wife, Edsel. )   Notes increased stressors -selling house, dealing with contractors.   Recent hospitalization for persistent epigastric pain/indigestion, found to have NSTEMI with troponin elevation to 1380, EKG showing T wave inversion lead III.  Hospital records reviewed. Med rec performed.  CTA chest negative for PE or dissection.  Cardiac catheterization showed mild nonobstructive disease with 30% distal LAD narrowing as well as aneurysmal arteries.  TTE with preserved EF 55-60%, normal valvular function, trivial pericardial effusion.  Thought to be consistent with type 1 NSTEMI.  Hospital labs showed A1c 5.2, LDL 167, trig 139, WBC 4.2, plt 170, Cr 0.85, elevated troponins up to 1380.   Cardiac catheterization 08/2024: aneurysmal arteries with non-obstructive disease; elevated LVEDP   Cardiac MRI 09/07/2024: Low normal left ventricular systolic function.  Normal RV size and function.  No evidence of myocardial scar, infiltrative, or inflammatory process.  Mild biatrial enlargement.  Normal valvular structure and function.  Trivial pericardial effusion.   TTE 09/06/2024: EF 55-60%, RV mildly dilated with normal RV function, RA mildly dilated, LA mildly dilated, mild MR and TR, mildly dilated aortic sinus  Started on aspirin 81mg  daily, atorvastatin 40mg  daily, toprol XL 25mg  daily,  omeprazole  20mg  daily and SL nitroglycerin PRN.  Rec avoid NSAIDs.  Rec cardiology f/u 4-6 wks He declined cardiac rehab.   He is taking atorvastatin 40mg  nightly and tolerating well.  He started aspirin 81mg  daily.  He continues taking omeprazole  20mg  daily.   No snoring, some non-restorative sleep due to stress, no witnessed apnea or PNdyspnea, no morning headaches or daytime sleepiness.  ESS = 0.  Has been tested for this in the past - tested negative.   Home health not set up.  Other follow up appointments scheduled: [facility] KimberLeann Potts NP 10/25/2024  ______________________________________________________________________ Hospital admission: 09/05/2024 Hospital discharge: 09/06/2024 TCM f/u phone call: not performed   Discharge Diagnoses:  NSTEMI, type I process   Summarized Medications Changes and Items to be Followed by PCP: -Although left heart catheterization did not show culprit lesion, still high suspicion for acute ACS. Given the lack of findings on left heart catheterization though will treat with monotherapy and not with aspirin and Plavix. -Continue all home medications as prescribed -Follow up with PCP in 1-2 weeks  -Follow up with cardiology in 4-6 weeks - Referral made for cardiac rehab      Relevant past medical, surgical, family and social history reviewed and updated as indicated. Interim medical history since our last visit reviewed. Allergies and medications reviewed and updated. Outpatient Medications Prior to Visit  Medication Sig Dispense Refill   aspirin EC 81 MG tablet Take 81 mg by mouth daily.     Cholecalciferol (VITAMIN D3) 125 MCG (5000 UT) CAPS Take  1 capsule (5,000 Units total) by mouth in the morning and at bedtime.     Magnesium  Oxide 500 MG CAPS Take 1 capsule (500 mg total) by mouth daily. 30 capsule 2   metoprolol succinate (TOPROL-XL) 25 MG 24 hr tablet Take 25 mg by mouth at bedtime.     nitroGLYCERIN (NITROSTAT) 0.4 MG SL  tablet Place 0.4 mg under the tongue every 5 (five) minutes as needed for chest pain.     oxyCODONE  (OXY IR/ROXICODONE ) 5 MG immediate release tablet Take 1 tablet (5 mg total) by mouth every 6 (six) hours as needed for severe pain (pain score 7-10). Must last 30 days 120 tablet 0   oxyCODONE  (OXY IR/ROXICODONE ) 5 MG immediate release tablet Take 1 tablet (5 mg total) by mouth every 6 (six) hours as needed for severe pain (pain score 7-10). Must last 30 days 120 tablet 0   oxyCODONE  (OXY IR/ROXICODONE ) 5 MG immediate release tablet Take 1 tablet (5 mg total) by mouth every 6 (six) hours as needed for severe pain (pain score 7-10). Must last 30 days 120 tablet 0   sildenafil  (VIAGRA ) 100 MG tablet Take 0.5-1 tablets (50-100 mg total) by mouth daily as needed for erectile dysfunction. 10 tablet 11   atorvastatin (LIPITOR) 40 MG tablet Take 40 mg by mouth at bedtime.     omeprazole  (PRILOSEC) 20 MG capsule Take 20 mg by mouth.     No facility-administered medications prior to visit.     Per HPI unless specifically indicated in ROS section below Review of Systems  Objective:  BP 124/74   Pulse 66   Temp 97.8 F (36.6 C) (Oral)   Ht 6' 2 (1.88 m)   Wt 256 lb 6 oz (116.3 kg)   SpO2 98%   BMI 32.92 kg/m   Wt Readings from Last 3 Encounters:  09/13/24 256 lb 6 oz (116.3 kg)  07/20/24 230 lb (104.3 kg)  07/14/24 230 lb (104.3 kg)      Physical Exam Vitals and nursing note reviewed.  Constitutional:      Appearance: Normal appearance. He is not ill-appearing.  HENT:     Head: Normocephalic and atraumatic.     Mouth/Throat:     Mouth: Mucous membranes are moist.     Pharynx: Oropharynx is clear. No oropharyngeal exudate or posterior oropharyngeal erythema.  Eyes:     Extraocular Movements: Extraocular movements intact.     Conjunctiva/sclera: Conjunctivae normal.     Pupils: Pupils are equal, round, and reactive to light.  Cardiovascular:     Rate and Rhythm: Normal rate and regular  rhythm.     Pulses: Normal pulses.     Heart sounds: Normal heart sounds. No murmur heard. Pulmonary:     Effort: Pulmonary effort is normal. No respiratory distress.     Breath sounds: Normal breath sounds. No wheezing, rhonchi or rales.  Chest:     Chest wall: No tenderness (no reproducible chest wall tenderness).  Abdominal:     General: Bowel sounds are normal. There is no distension.     Palpations: Abdomen is soft. There is no mass.     Tenderness: There is no abdominal tenderness. There is no guarding or rebound.     Hernia: No hernia is present.  Musculoskeletal:     Cervical back: Normal range of motion and neck supple.     Right lower leg: No edema.     Left lower leg: No edema.  Skin:  General: Skin is warm and dry.     Findings: No rash.  Neurological:     Mental Status: He is alert.       Results for orders placed or performed in visit on 09/05/24  Lab report - scanned   Collection Time: 09/05/24 12:34 PM  Result Value Ref Range   A1c 5.2    Lab Results  Component Value Date   NA 136 04/03/2024   CL 99 04/03/2024   K 4.8 04/03/2024   CO2 29 04/03/2024   BUN 21 04/03/2024   CREATININE 0.81 04/03/2024   GFR 104.22 04/03/2024   CALCIUM 9.0 04/03/2024   ALBUMIN 4.4 04/03/2024   GLUCOSE 91 04/03/2024    Lab Results  Component Value Date   ALT 14 04/03/2024   AST 15 04/03/2024   ALKPHOS 76 04/03/2024   BILITOT 1.1 04/03/2024    Lab Results  Component Value Date   CHOL 245 (H) 04/03/2024   HDL 50.00 04/03/2024   LDLCALC 185 (H) 04/03/2024   TRIG 50.0 04/03/2024   CHOLHDL 5 04/03/2024    Lab Results  Component Value Date   WBC 3.4 (L) 04/03/2024   HGB 15.4 04/03/2024   HCT 44.9 04/03/2024   MCV 87.2 04/03/2024   PLT 184.0 04/03/2024    Assessment & Plan:  Rec against rapid weight fluctuations which exert increased stress on body   Problem List Items Addressed This Visit     Obesity, Class I, BMI 30-34.9   Continue to encourage healthy  diet and lifestyle choices to maintain previously achieved weight loss.  Discussed concerns with pro-inflammatory carnivore diet, recommend mediterranean diet, handout provided.       History of diabetes mellitus   A1c remains in normal range after significant weight loss.       Epigastric pain   Symptoms presented with epigastric pain and indigestion.  Anticipate component of GERD.  Continue omeprazole  20mg  daily which is well tolerated and effective.       NSTEMI (non-ST elevated myocardial infarction) (HCC) - Primary   Diagnosed with NSTEMI type 1 process based on elevated troponins.  Overall reassuring LHC and cardiac MRI.  Now on aspirin 81mg  daily, atorvastatin 40mg  and seems to be tolerating well.  Will reassess lipid control in a few months.  He declined cardiac rehab.  Recommend cards f/u in 6-8 wks as recommended. Reviewed risks of continued carnivore diet.  Denies daytime sleepiness symptoms or other symptoms of OSA, states he has previously tested negative for this.       Relevant Medications   nitroGLYCERIN (NITROSTAT) 0.4 MG SL tablet   aspirin EC 81 MG tablet   metoprolol succinate (TOPROL-XL) 25 MG 24 hr tablet   atorvastatin (LIPITOR) 40 MG tablet     Meds ordered this encounter  Medications   omeprazole  (PRILOSEC) 20 MG capsule    Sig: Take 1 capsule (20 mg total) by mouth daily.    Dispense:  30 capsule    Refill:  3   atorvastatin (LIPITOR) 40 MG tablet    Sig: Take 1 tablet (40 mg total) by mouth at bedtime.    Dispense:  90 tablet    Refill:  2    No orders of the defined types were placed in this encounter.   Patient Instructions  Continue current medicines including aspirin and atorvastatin.  Check into mediterranean diet (see handout) Good to see you today Will await cardiology eval next month.   Follow up plan: No follow-ups  on file.  Anton Blas, MD

## 2024-09-18 ENCOUNTER — Encounter: Payer: Self-pay | Admitting: Family Medicine

## 2024-09-19 DIAGNOSIS — I214 Non-ST elevation (NSTEMI) myocardial infarction: Secondary | ICD-10-CM | POA: Insufficient documentation

## 2024-09-19 MED ORDER — ROSUVASTATIN CALCIUM 20 MG PO TABS: 20.0000 mg | ORAL_TABLET | Freq: Every day | ORAL | 11 refills | Status: DC

## 2024-09-19 NOTE — Assessment & Plan Note (Signed)
 Symptoms presented with epigastric pain and indigestion.  Anticipate component of GERD.  Continue omeprazole  20mg  daily which is well tolerated and effective.

## 2024-09-19 NOTE — Assessment & Plan Note (Addendum)
 Diagnosed with NSTEMI type 1 process based on elevated troponins.  Overall reassuring LHC and cardiac MRI.  Now on aspirin 81mg  daily, atorvastatin 40mg  and seems to be tolerating well.  Will reassess lipid control in a few months.  He declined cardiac rehab.  Recommend cards f/u in 6-8 wks as recommended. Reviewed risks of continued carnivore diet.  Denies daytime sleepiness symptoms or other symptoms of OSA, states he has previously tested negative for this.

## 2024-09-19 NOTE — Assessment & Plan Note (Addendum)
 Continue to encourage healthy diet and lifestyle choices to maintain previously achieved weight loss.  Discussed concerns with pro-inflammatory carnivore diet, recommend mediterranean diet, handout provided.

## 2024-09-19 NOTE — Assessment & Plan Note (Signed)
 A1c remains in normal range after significant weight loss.

## 2024-10-01 ENCOUNTER — Encounter: Payer: Self-pay | Admitting: Family Medicine

## 2024-10-02 NOTE — Telephone Encounter (Signed)
 Called patient states that cardiology had put him on beta blockers. He didn't want to start but did. He has noticed weight gain over time. But swelling in feet and legs started in the last couple weeks its not bad but he can tell its there. Denies any sob or chest pain/pressure. Able to walk with no symptoms. He was started on it when in the hospital in September. He does have appointment with cardiology 10/25/24. He wants to know if you are ok with him stopping metoprolol succinate (TOPROL-XL) 25 MG 24 hr tablet.

## 2024-10-18 ENCOUNTER — Ambulatory Visit: Attending: Nurse Practitioner | Admitting: Nurse Practitioner

## 2024-10-18 ENCOUNTER — Encounter: Payer: Self-pay | Admitting: Nurse Practitioner

## 2024-10-18 DIAGNOSIS — M25551 Pain in right hip: Secondary | ICD-10-CM | POA: Diagnosis not present

## 2024-10-18 DIAGNOSIS — M533 Sacrococcygeal disorders, not elsewhere classified: Secondary | ICD-10-CM | POA: Diagnosis present

## 2024-10-18 DIAGNOSIS — M545 Low back pain, unspecified: Secondary | ICD-10-CM | POA: Insufficient documentation

## 2024-10-18 DIAGNOSIS — M25561 Pain in right knee: Secondary | ICD-10-CM | POA: Insufficient documentation

## 2024-10-18 DIAGNOSIS — M549 Dorsalgia, unspecified: Secondary | ICD-10-CM | POA: Diagnosis present

## 2024-10-18 DIAGNOSIS — Z79899 Other long term (current) drug therapy: Secondary | ICD-10-CM | POA: Insufficient documentation

## 2024-10-18 DIAGNOSIS — M25511 Pain in right shoulder: Secondary | ICD-10-CM | POA: Insufficient documentation

## 2024-10-18 DIAGNOSIS — M25512 Pain in left shoulder: Secondary | ICD-10-CM | POA: Diagnosis present

## 2024-10-18 DIAGNOSIS — Z79891 Long term (current) use of opiate analgesic: Secondary | ICD-10-CM | POA: Diagnosis present

## 2024-10-18 DIAGNOSIS — G894 Chronic pain syndrome: Secondary | ICD-10-CM | POA: Diagnosis present

## 2024-10-18 DIAGNOSIS — G8929 Other chronic pain: Secondary | ICD-10-CM | POA: Diagnosis present

## 2024-10-18 MED ORDER — OXYCODONE HCL 5 MG PO TABS: 5.0000 mg | ORAL_TABLET | Freq: Four times a day (QID) | ORAL | 0 refills | Status: DC | PRN

## 2024-10-18 NOTE — Progress Notes (Signed)
 Nursing Pain Medication Assessment:  Safety precautions to be maintained throughout the outpatient stay will include: orient to surroundings, keep bed in low position, maintain call bell within reach at all times, provide assistance with transfer out of bed and ambulation.  Medication Inspection Compliance: Pill count conducted under aseptic conditions, in front of the patient. Neither the pills nor the bottle was removed from the patient's sight at any time. Once count was completed pills were immediately returned to the patient in their original bottle.  Medication: Oxycodone  IR Pill/Patch Count: 3 of 120 pills/patches remain Pill/Patch Appearance: Markings consistent with prescribed medication Bottle Appearance: Standard pharmacy container. Clearly labeled. Filled Date: 62 / 7 / 2025 Last Medication intake:  TodaySafety precautions to be maintained throughout the outpatient stay will include: orient to surroundings, keep bed in low position, maintain call bell within reach at all times, provide assistance with transfer out of bed and ambulation.

## 2024-10-18 NOTE — Progress Notes (Signed)
 PROVIDER NOTE: Interpretation of information contained herein should be left to medically-trained personnel. Specific patient instructions are provided elsewhere under Patient Instructions section of medical record. This document was created in part using AI and STT-dictation technology, any transcriptional errors that may result from this process are unintentional.  Patient: Larry Goodwin  Service: E/M   PCP: Rilla Baller, MD  DOB: 1976-04-17  DOS: 10/18/2024  Provider: Emmy MARLA Blanch, NP  MRN: [id number]  Delivery: Face-to-face  Specialty: Interventional Pain Management  Type: Established Patient  Setting: Ambulatory outpatient facility  Specialty designation: 09  Referring Prov.: Rilla Baller, MD  Location: Outpatient office facility       History of present illness (HPI) Larry Goodwin, a 48 y.o. year old male, is here today because of his Lower back pain. Mr. Chopp's primary complain today is Back Pain (lower)  Pertinent problems: Mr. Schneck  has lumbar DDD (degenerative disc disease); myofascial pain syndrome; lumbar facet syndrome (bilateral) (R>L); chronic upper back pain (3ry area of Pain); Lumbar spondylosis with radiculopathy; Epidural fibrosis; Failed back surgical syndrome; Chronic low back pain (1ry area of Pain) (Bilateral) (R>L); Chronic sacroiliac joint pain (Right); and chronic pain syndrome on their pertinent problem list.  Pain Assessment: Severity of Chronic pain is reported as a 3 /10. Location: Back Lower/pain radiaties down right buttock to his right thigh. Onset: More than a month ago. Quality: Aching, Burning, Goodwin, Throbbing. Timing: Goodwin. Modifying factor(s): lay down, heat, meds. Vitals:  height is 6' 2 (1.88 m) and weight is 245 lb (111.1 kg). His temperature is 97.3 F (36.3 C) (abnormal). His blood pressure is 149/96 (abnormal) and his pulse is 62. His oxygen saturation is 99%.  BMI: Estimated body mass index is 31.46 kg/m as calculated  from the following:   Height as of this encounter: 6' 2 (1.88 m).   Weight as of this encounter: 245 lb (111.1 kg).  Last encounter: 07/20/2024. Last procedure: Visit date not found.  Reason for encounter: medication management. The patient indicates doing well with the current medication regimen. He denies any adverse reaction or side effects to the medication.   Discussed the use of AI scribe software for clinical note transcription with the patient, who gave verbal consent to proceed.  History of Present Illness   Larry Goodwin is a 48 year old male with chronic back pain who presents for pain management. He experiences Goodwin back pain that radiates to his hip, describing it as persistent and present all the time. Despite approximately four or five back surgeries due to rapidly deteriorating bones, he continues to experience significant pain. He is currently on medication management, taking oxycodone  5 mg every six hours, which helps ease the pain to a manageable level. No side effects from the medication. He is able to walk and function, although he acknowledges the possibility of requiring a wheelchair in the future.    Pharmacotherapy Assessment   Oxycodone  (Oxy IR/Roxicodone ) 5 mg immediate release tablet every 6 hours as needed for severe pain. MME=30 Monitoring: [place] PMP: PDMP reviewed during this encounter.       Pharmacotherapy: No side-effects or adverse reactions reported. Compliance: No problems identified. Effectiveness: Clinically acceptable.  Delores Dorothe LABOR, RN  10/18/2024  9:31 AM  Sign when Signing Visit Nursing Pain Medication Assessment:  Safety precautions to be maintained throughout the outpatient stay will include: orient to surroundings, keep bed in low position, maintain call bell within reach at all times, provide assistance with  transfer out of bed and ambulation.  Medication Inspection Compliance: Pill count conducted under aseptic conditions, in front  of the patient. Neither the pills nor the bottle was removed from the patient's sight at any time. Once count was completed pills were immediately returned to the patient in their original bottle.  Medication: Oxycodone  IR Pill/Patch Count: 3 of 120 pills/patches remain Pill/Patch Appearance: Markings consistent with prescribed medication Bottle Appearance: Standard pharmacy container. Clearly labeled. Filled Date: 74 / 7 / 2025 Last Medication intake:  TodaySafety precautions to be maintained throughout the outpatient stay will include: orient to surroundings, keep bed in low position, maintain call bell within reach at all times, provide assistance with transfer out of bed and ambulation.     UDS:  Summary  Date Value Ref Range Status  04/12/2024 FINAL  Final    Comment:    ==================================================================== [employer] [facility] 13 (MW) ==================================================================== Test                             Result       Flag       Units  Drug Present and Declared for Prescription Verification   Oxycodone                       1878         EXPECTED   ng/mg creat   Oxymorphone                    517          EXPECTED   ng/mg creat   Noroxycodone                   3291         EXPECTED   ng/mg creat   Noroxymorphone                 339          EXPECTED   ng/mg creat    Sources of oxycodone  are scheduled prescription medications.    Oxymorphone, noroxycodone, and noroxymorphone are expected    metabolites of oxycodone . Oxymorphone is also available as a    scheduled prescription medication.  ==================================================================== Test                      Result    Flag   Units      Ref Range   Creatinine              23               mg/dL      >=79 ==================================================================== Declared Medications:  The flagging and interpretation on this report are  based on the  following declared medications.  Unexpected results may arise from  inaccuracies in the declared medications.   **Note: The testing scope of this panel includes these medications:   Oxycodone  (Roxicodone )   **Note: The testing scope of this panel does not include the  following reported medications:   Magnesium  (Mag-Ox)  Naloxone  (Narcan )  Nystatin  (Mycostatin )  Sildenafil  (Viagra )  Triamcinolone  (Kenalog )  Vitamin B12  Vitamin D3 ==================================================================== For clinical consultation, please call [phone]. ====================================================================     No results found for: CBDTHCR No results found for: D8THCCBX No results found for: D9THCCBX  ROS  Constitutional: Denies any fever or chills Gastrointestinal: No reported hemesis, hematochezia, vomiting, or acute GI distress Musculoskeletal:  Low back pain Neurological: No reported episodes of acute onset apraxia, aphasia, dysarthria, agnosia, amnesia, paralysis, loss of coordination, or loss of consciousness  Medication Review  Magnesium  Oxide -Mg Supplement, Vitamin D3, aspirin EC, metoprolol succinate, nitroGLYCERIN, omeprazole , oxyCODONE , rosuvastatin, and sildenafil   History Review  Allergy: Larry Goodwin is allergic to levaquin  [levofloxacin  in d5w]. Drug: Larry Goodwin  reports current drug use. Drug: Oxycodone . Alcohol:  reports no history of alcohol use. Tobacco:  reports that he quit smoking about 16 years ago. His smoking use included cigarettes. He has never used smokeless tobacco. Social: Mr. Matthis  reports that he quit smoking about 16 years ago. His smoking use included cigarettes. He has never used smokeless tobacco. He reports current drug use. Drug: Oxycodone . He reports that he does not drink alcohol. 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 Surgical: Larry Goodwin  has a past surgical history that includes Back surgery (12/14/2010); Cholecystectomy; and Spine surgery (2014). Family: family history includes Arthritis in his mother; COPD in his mother; Hypertension in an other family member; Obesity in his mother.  Laboratory Chemistry Profile   Renal Lab Results  Component Value Date   BUN 21 04/03/2024   CREATININE 0.81 04/03/2024   BCR SEE NOTE: 07/02/2023   GFR 104.22 04/03/2024   GFRAA 129 02/06/2019   GFRNONAA 112 02/06/2019    Hepatic Lab Results  Component Value Date   AST 15 04/03/2024   ALT 14 04/03/2024   ALBUMIN 4.4 04/03/2024   ALKPHOS 76 04/03/2024   LIPASE 153 09/21/2012    Electrolytes Lab Results  Component Value Date   NA 136 04/03/2024   K 4.8 04/03/2024   CL 99 04/03/2024   CALCIUM 9.0 04/03/2024   MG 1.8 04/22/2022    Bone Lab Results  Component Value Date   VD25OH 68.48 04/03/2024   25OHVITD1 35 02/06/2019   25OHVITD2 <1.0 02/06/2019   25OHVITD3 34 02/06/2019    Inflammation (CRP: Acute Phase) (ESR: Chronic Phase) Lab Results  Component Value Date   CRP 9 02/06/2019   ESRSEDRATE 6 02/06/2019         Note: Above Lab results reviewed.  Recent Imaging Review  DG Foot Complete Right CLINICAL DATA:  Jarring foot 3 days prior, no fall  EXAM: RIGHT FOOT COMPLETE - 3+ VIEW  COMPARISON:  Radiograph 03/24/2012  FINDINGS: No acute bony abnormality. Specifically, no fracture, subluxation, or dislocation. Mild degenerative changes are noted in the midfoot. Posterior calcaneal spur is present. Trace ankle effusion. Soft tissues are otherwise unremarkable  IMPRESSION: No acute fracture or traumatic malalignment.  Mild degenerative changes in the midfoot.  Posterior calcaneal spur.  Trace ankle effusion.  Electronically Signed   By:  Gretel Edis M.D.   On: 02/12/2020 19:03 Note: Reviewed        Physical Exam  Vitals: BP (!) 149/96   Pulse 62   Temp (!) 97.3 F (36.3 C)   Ht 6' 2 (1.88 m)   Wt 245 lb (111.1 kg)   SpO2 99%   BMI 31.46 kg/m  BMI: Estimated body mass index is 31.46 kg/m as calculated from the following:   Height as of this encounter: 6' 2 (1.88 m).   Weight as of this encounter: 245 lb (111.1 kg). Ideal: Ideal body weight: 82.2 kg (181 lb 3.5 oz) Adjusted ideal body weight:  93.8 kg (206 lb 11.7 oz) General appearance: Well nourished, well developed, and well hydrated. In no apparent acute distress Mental status: Alert, oriented x 3 (person, place, & time)       Respiratory: No evidence of acute respiratory distress Eyes: PERLA  Musculoskeletal: +LBP Assessment   Diagnosis Status  1. Chronic low back pain (1ry area of Pain) (Bilateral) (R>L)   2. Chronic pain syndrome   3. Chronic use of opiate for therapeutic purpose   4. Chronic hip pain (2ry area of Pain) (Right)   5. Chronic upper back pain (3ry area of Pain)   6. Chronic shoulder pain (4th area of Pain) (Bilateral) (Right)   7. Chronic sacroiliac joint pain (Right)   8. Chronic knee pain (Right)   9. Pharmacologic therapy    Controlled Controlled Controlled   Updated Problems: No problems updated.  Plan of Care  Problem-specific:  Assessment and Plan    Chronic back pain  Chronic back pain with hip radiation, severe and Goodwin. History of multiple back surgeries with rapid bone deterioration. Pain managed with oxycodone  5 mg every six hours, no side effects or adverse reaction reported to medication. Functional status maintained, ambulatory, potential future wheelchair use. - Continue oxycodone  5 mg every six hours.  Chronic pain syndrome: Patient's pain is well-controlled with oxycodone , will continue on current medication regimen.  Prescribed oxycodone  (PMP) reviewed, findings consistent with the use of prescribed  medication and no evidence of narcotic misuse or abuse.  Urine drug screening (UDS) up to date and with the use of prescribed medication.  No side effects or adverse reaction reported to medication.  The patient was advised to walk for at least 30 minutes to improve mobility.  Schedule follow-up in 90 days for medication management.  Chronic use of opiate for therapeutic purpose: Continue with current medication regimen Meds ordered this encounter  Medications   oxyCODONE  (OXY IR/ROXICODONE ) 5 MG immediate release tablet    Sig: Take 1 tablet (5 mg total) by mouth every 6 (six) hours as needed for severe pain (pain score 7-10). Must last 30 days    Dispense:  120 tablet    Refill:  0    DO NOT: delete (not duplicate); no partial-fill (will deny script to complete), no refill request (F/U required). DISPENSE: 1 day early if closed on fill date. WARN: No CNS-depressants within 8 hrs of med.   oxyCODONE  (OXY IR/ROXICODONE ) 5 MG immediate release tablet    Sig: Take 1 tablet (5 mg total) by mouth every 6 (six) hours as needed for severe pain (pain score 7-10). Must last 30 days    Dispense:  120 tablet    Refill:  0    DO NOT: delete (not duplicate); no partial-fill (will deny script to complete), no refill request (F/U required). DISPENSE: 1 day early if closed on fill date. WARN: No CNS-depressants within 8 hrs of med.   oxyCODONE  (OXY IR/ROXICODONE ) 5 MG immediate release tablet    Sig: Take 1 tablet (5 mg total) by mouth every 6 (six) hours as needed for severe pain (pain score 7-10). Must last 30 days    Dispense:  120 tablet    Refill:  0    DO NOT: delete (not duplicate); no partial-fill (will deny script to complete), no refill request (F/U required). DISPENSE: 1 day early if closed on fill date. WARN: No CNS-depressants within 8 hrs of med.        Mr. LUCIA Goodwin has a  current medication list which includes the following long-term medication(s): magnesium  oxide -mg supplement,  metoprolol succinate, nitroglycerin, omeprazole , [START ON 10/19/2024] oxycodone , [START ON 11/18/2024] oxycodone , [START ON 12/18/2024] oxycodone , rosuvastatin, and sildenafil .  Pharmacotherapy (Medications Ordered): Meds ordered this encounter  Medications   oxyCODONE  (OXY IR/ROXICODONE ) 5 MG immediate release tablet    Sig: Take 1 tablet (5 mg total) by mouth every 6 (six) hours as needed for severe pain (pain score 7-10). Must last 30 days    Dispense:  120 tablet    Refill:  0    DO NOT: delete (not duplicate); no partial-fill (will deny script to complete), no refill request (F/U required). DISPENSE: 1 day early if closed on fill date. WARN: No CNS-depressants within 8 hrs of med.   oxyCODONE  (OXY IR/ROXICODONE ) 5 MG immediate release tablet    Sig: Take 1 tablet (5 mg total) by mouth every 6 (six) hours as needed for severe pain (pain score 7-10). Must last 30 days    Dispense:  120 tablet    Refill:  0    DO NOT: delete (not duplicate); no partial-fill (will deny script to complete), no refill request (F/U required). DISPENSE: 1 day early if closed on fill date. WARN: No CNS-depressants within 8 hrs of med.   oxyCODONE  (OXY IR/ROXICODONE ) 5 MG immediate release tablet    Sig: Take 1 tablet (5 mg total) by mouth every 6 (six) hours as needed for severe pain (pain score 7-10). Must last 30 days    Dispense:  120 tablet    Refill:  0    DO NOT: delete (not duplicate); no partial-fill (will deny script to complete), no refill request (F/U required). DISPENSE: 1 day early if closed on fill date. WARN: No CNS-depressants within 8 hrs of med.   Orders:  No orders of the defined types were placed in this encounter.       Return in about 3 months (around 01/18/2025) for (F2F), (MM), Emmy Blanch NP.    Recent Visits Date Type Provider Dept  07/20/24 Office Visit Waleska Buttery K, NP [facility]  Showing recent visits within past 90 days and meeting all other requirements Today's  Visits Date Type Provider Dept  10/18/24 Office Visit Isha Seefeld K, NP [facility]  Showing today's visits and meeting all other requirements Future Appointments Date Type Provider Dept  01/15/25 Appointment Shayle Donahoo K, NP [facility]  Showing future appointments within next 90 days and meeting all other requirements  I discussed the assessment and treatment plan with the patient. The patient was provided an opportunity to ask questions and all were answered. The patient agreed with the plan and demonstrated an understanding of the instructions.  Patient advised to call back or seek an in-person evaluation if the symptoms or condition worsens.  I personally spent a total of 30 minutes in the care of the patient today including preparing to see the patient, getting/reviewing separately obtained history, performing a medically appropriate exam/evaluation, counseling and educating, placing orders, referring and communicating with other health care professionals, documenting clinical information in the EHR, independently interpreting results, communicating results, and coordinating care.   Note by: Neville Walston K Eulala Newcombe, NP (TTS and AI technology used. I apologize for any typographical errors that were not detected and corrected.) Date: 10/18/2024; Time: 10:55 AM

## 2024-11-20 ENCOUNTER — Encounter: Payer: Self-pay | Admitting: Nurse Practitioner

## 2024-11-20 ENCOUNTER — Other Ambulatory Visit: Payer: Self-pay | Admitting: *Deleted

## 2024-11-20 ENCOUNTER — Telehealth: Payer: Self-pay | Admitting: Nurse Practitioner

## 2024-11-20 DIAGNOSIS — G894 Chronic pain syndrome: Secondary | ICD-10-CM

## 2024-11-20 DIAGNOSIS — Z79891 Long term (current) use of opiate analgesic: Secondary | ICD-10-CM

## 2024-11-20 DIAGNOSIS — G8929 Other chronic pain: Secondary | ICD-10-CM

## 2024-11-20 DIAGNOSIS — Z79899 Other long term (current) drug therapy: Secondary | ICD-10-CM

## 2024-11-20 MED ORDER — OXYCODONE HCL 5 MG PO TABS: 5.0000 mg | ORAL_TABLET | Freq: Four times a day (QID) | ORAL | 0 refills | Status: DC | PRN

## 2024-11-20 NOTE — Telephone Encounter (Signed)
 [facility] not [facility] . Has been without since Saturday. Can Seema please send script to [facility] at [address] [place], [place] [address] Phone [phone]. He ask that the next month script be sent there as well. He is going to change his pharmacy to [facility] when he comes in next time.

## 2024-11-20 NOTE — Telephone Encounter (Signed)
 Message to Seema

## 2024-11-21 ENCOUNTER — Other Ambulatory Visit: Payer: Self-pay | Admitting: Nurse Practitioner

## 2024-11-21 DIAGNOSIS — Z79899 Other long term (current) drug therapy: Secondary | ICD-10-CM

## 2024-11-21 DIAGNOSIS — G8929 Other chronic pain: Secondary | ICD-10-CM

## 2024-11-21 DIAGNOSIS — M545 Low back pain, unspecified: Secondary | ICD-10-CM

## 2024-11-21 DIAGNOSIS — G894 Chronic pain syndrome: Secondary | ICD-10-CM

## 2024-11-21 DIAGNOSIS — Z79891 Long term (current) use of opiate analgesic: Secondary | ICD-10-CM

## 2024-11-21 MED ORDER — OXYCODONE HCL 5 MG PO TABS: 5.0000 mg | ORAL_TABLET | Freq: Four times a day (QID) | ORAL | 0 refills | Status: DC | PRN

## 2024-11-21 NOTE — Telephone Encounter (Signed)
 [facility] and [facility] do not have qty to fill his current pain medication.  I have asked her to cancel all remaining opioid Rx;s and will ask Seema to send to [facility] per patient request.

## 2024-11-21 NOTE — Telephone Encounter (Signed)
 Called patient to let him know that [facility] has sent his Rx's to [facility] per his request and all remaining Rx's at [facility] have been cancelled.  He should Rx for Dec and Jan.

## 2024-11-27 ENCOUNTER — Other Ambulatory Visit: Payer: Self-pay | Admitting: Family Medicine

## 2024-11-27 NOTE — Telephone Encounter (Unsigned)
 Copied from CRM #[id number]. Topic: Clinical - Medication Refill >> Nov 27, 2024  9:27 AM Fonda T wrote: Medication: aspirin  EC 81 MG tablet (Medication prescribed by hospital per pt)  Pt requesting 90 day supply, with refills  Has the patient contacted their pharmacy? Yes, advised to contact office   This is the patient's preferred pharmacy:   [facility] [address] [place], [place] - [address] AT [facility] [address] [place] [address] Phone: [phone] Fax: [phone]  Is this the correct pharmacy for this prescription? Yes If no, delete pharmacy and type the correct one.   Has the prescription been filled recently? Yes  Is the patient out of the medication? Yes  Has the patient been seen for an appointment in the last year OR does the patient have an upcoming appointment? Yes, last seen 09/13/2024  Can we respond through MyChart? No, prefers ph [phone]  Agent: Please be advised that Rx refills may take up to 3 business days. We ask that you follow-up with your pharmacy.

## 2024-11-28 NOTE — Telephone Encounter (Signed)
 Listed under historical provider ok to fill?

## 2024-11-29 MED ORDER — ASPIRIN 81 MG PO TBEC: 81.0000 mg | DELAYED_RELEASE_TABLET | Freq: Every day | ORAL | 3 refills | Status: AC

## 2024-12-04 ENCOUNTER — Other Ambulatory Visit: Payer: Self-pay | Admitting: Family Medicine

## 2024-12-04 NOTE — Telephone Encounter (Signed)
 Copied from CRM [id number]. Topic: Clinical - Medication Refill >> Dec 04, 2024  1:12 PM Franky GRADE wrote: Medication: omeprazole  (PRILOSEC) 20 MG capsule [[id number]]  Has the patient contacted their pharmacy? Yes, they asked patient to contact the office.  (Agent: If no, request that the patient contact the pharmacy for the refill. If patient does not wish to contact the pharmacy document the reason why and proceed with request.) (Agent: If yes, when and what did the pharmacy advise?)  This is the patient's preferred pharmacy:  [id number] [place], [place] - [address] AT [facility] [address] [place] [address] Phone: [phone] Fax: [phone]   Is this the correct pharmacy for this prescription? Yes If no, delete pharmacy and type the correct one.   Has the prescription been filled recently? No  Is the patient out of the medication? No  Has the patient been seen for an appointment in the last year OR does the patient have an upcoming appointment? Yes  Can we respond through MyChart? Yes  Agent: Please be advised that Rx refills may take up to 3 business days. We ask that you follow-up with your pharmacy.

## 2024-12-06 MED ORDER — OMEPRAZOLE 20 MG PO CPDR: 20.0000 mg | DELAYED_RELEASE_CAPSULE | Freq: Every day | ORAL | 0 refills | Status: DC

## 2024-12-11 ENCOUNTER — Telehealth: Payer: Self-pay

## 2024-12-11 ENCOUNTER — Ambulatory Visit: Payer: Self-pay

## 2024-12-11 ENCOUNTER — Encounter: Payer: Self-pay | Admitting: Family Medicine

## 2024-12-11 NOTE — Telephone Encounter (Signed)
 FYI Only or Action Required?: FYI only for provider: will maintain video visit as scheduled, will go to ED or Urgent Care if symptoms worsen.  Patient was last seen in primary care on 09/13/2024 by Rilla Baller, MD.  Called Nurse Triage reporting Generalized Body Aches, Sore Throat, Cough, and Nasal Congestion.  Feels like a sinus infection;  Faint (questionable) line to COVID home test.   Symptoms began several days ago.  Interventions attempted: Nothing.  Symptoms are: gradually worsening.  Triage Disposition: See PCP When Office is Open (Within 3 Days)  Patient/caregiver understands and will follow disposition?: Yes    Copied from CRM #[id number]. Topic: Appointments - Appointment Scheduling >> Dec 11, 2024  9:29 AM Tonda B wrote: Patient/patient representative is calling to schedule an appointment. Refer to attachments for appointment information.     >> Dec 11, 2024  4:34 PM Delon T wrote: Patient returning call to NT    Reason for Disposition  Lots of coughing    Has video visit scheduled Wednesday.  Additional Information  Negative: [1] Sinus pain (not just congestion) AND [2] fever    No current fever or chills.  No ear pain, just ear popping.  Negative: [1] Using nasal washes and pain medicine > 24 hours AND [2] sinus pain (around cheekbone or eye) persists    Has not yet tried nasal washes this time.   See alternative assessment.  Answer Assessment - Initial Assessment Questions 1. SYMPTOMS: What is your main symptom or concern? (e.g., cough, fever, shortness of breath, muscle aches)     Body aches, cough, headache with cough, nasal congestion.   2. ONSET: When did the symptoms start?      Christmas Day  3. COUGH: Do you have a cough? If Yes, ask: How bad is the cough?       Yes, occasional with scratchy throat.  Productive- thick, green.  History of sinus infections.    4. FEVER: Do you have a fever? If Yes, ask: What is your temperature,  how was it measured, and when did it start?     Chills last night and this morning, unknown if having fever.   Not current.   5. BREATHING DIFFICULTY: Are you having any difficulty breathing? (e.g., normal; shortness of breath, wheezing, unable to speak)      Denies  6. BETTER-SAME-WORSE: Are you getting better, staying the same or getting worse compared to yesterday?  If getting worse, ask, In what way?     Worse than yesterday. Became worse yesterday evening.  7. OTHER SYMPTOMS: Do you have any other symptoms?  (e.g., chills, fatigue, headache, loss of smell or taste, muscle pain, sore throat)     Fatigue, sore throat, chills.  Feels like sinus infection.   Sore throat- 4/10 Body Aches- 4/10  8. INFLUENZA EXPOSURE: Was there any known exposure to influenza (flu) before the symptoms began?      No known.   9. INFLUENZA SUSPECTED: Why do you think you have influenza? (e.g., positive flu self-test at home, symptoms after exposure).     Prevalent in community.   [facility] Flu/Covid Test-  faint line to COVID, everything else clear.   10. INFLUENZA VACCINE: Have you had the flu vaccine? If Yes, ask: When did you last get it?       Does not receive vaccines.   11. HIGH RISK FOR COMPLICATIONS: Do you have any chronic medical problems? (e.g., asthma, heart or lung disease, obesity, weak immune system)  History of heart attack.  History of sinus infections.    13. O2 SATURATION MONITOR:  Do you use an oxygen saturation monitor (pulse oximeter) at home? If Yes, ask What is your reading (oxygen level) today? What is your usual oxygen saturation reading? (e.g., 95%)       Does not have available, breathing fine with activity and at rest.   Denies loss of taste or smell.    Was told not to take ibuprofen or acetaminophen .  Answer Assessment - Initial Assessment Questions Patient initially called back to schedule earlier appointment as believed there was a sooner  appt available with PCP.  Agreed to triage, assessment as follows:     Pain around nose and above eye brows.  Thick, green nasal drainage and congested.   Typically gets body aches with sinus infections.  Denies any mold growth, humidity issues, or water damage in home that patient is aware of.  Protocols used: Influenza (Flu) Suspected-A-AH, Sinus Pain or Congestion-A-AH

## 2024-12-11 NOTE — Telephone Encounter (Signed)
 Please see new CRM, patient has called back and situation addressed.

## 2024-12-11 NOTE — Telephone Encounter (Unsigned)
 Copied from CRM #[id number]. Topic: Appointments - Appointment Scheduling >> Dec 11, 2024  9:29 AM Tonda B wrote: Patient/patient representative is calling to schedule an appointment. Refer to attachments for appointment information. >> Dec 11, 2024  4:34 PM Delon T wrote: Patient returning call to NT

## 2024-12-11 NOTE — Telephone Encounter (Signed)
2nd attempt. Left message.

## 2024-12-11 NOTE — Telephone Encounter (Signed)
 First attempt made to reach patient at 12:58. LM to call back to [phone]  Copied from CRM #[id number]. Topic: Clinical - Red Word Triage >> Dec 11, 2024  8:37 AM Roselie BROCKS wrote: [facility] that prompted transfer to Nurse Triage: Patient made appnt in mychart for Wednesday, and was wanting a sooner appnt, and mentioned his throat is really painful and sore, a bad headache, fever, and its very thick and green when he blows his nose. >> Dec 11, 2024  8:50 AM Roselie C wrote: Pt disconnected while holding, no answer at call back.

## 2024-12-12 NOTE — Telephone Encounter (Signed)
 Pt is scheduled for a video visit with Dr. KANDICE on 12/13/24.

## 2024-12-13 ENCOUNTER — Telehealth: Admitting: Family Medicine

## 2024-12-13 ENCOUNTER — Encounter: Payer: Self-pay | Admitting: Family Medicine

## 2024-12-13 VITALS — Ht 74.0 in | Wt 245.0 lb

## 2024-12-13 DIAGNOSIS — J019 Acute sinusitis, unspecified: Secondary | ICD-10-CM | POA: Diagnosis not present

## 2024-12-13 DIAGNOSIS — I2510 Atherosclerotic heart disease of native coronary artery without angina pectoris: Secondary | ICD-10-CM | POA: Diagnosis not present

## 2024-12-13 DIAGNOSIS — R1013 Epigastric pain: Secondary | ICD-10-CM | POA: Diagnosis not present

## 2024-12-13 MED ORDER — AMOXICILLIN-POT CLAVULANATE 875-125 MG PO TABS: 1.0000 | ORAL_TABLET | Freq: Two times a day (BID) | ORAL | 0 refills | Status: AC

## 2024-12-13 MED ORDER — OMEPRAZOLE 20 MG PO CPDR: 20.0000 mg | DELAYED_RELEASE_CAPSULE | Freq: Every day | ORAL | 3 refills | Status: AC

## 2024-12-13 MED ORDER — ATORVASTATIN CALCIUM 40 MG PO TABS: 40.0000 mg | ORAL_TABLET | Freq: Every day | ORAL | 3 refills | Status: AC

## 2024-12-13 NOTE — Assessment & Plan Note (Signed)
 Treat for bacterial sinusitis with h/o chronic sinusitis.  Further supportive measures reviewed ie mucinex , fluids, rest.

## 2024-12-13 NOTE — Progress Notes (Signed)
 " Ph: [phone] Fax: [phone]   Patient ID: Larry Goodwin, male    DOB: 02-09-1976, 48 y.o.   MRN: [id number]  Virtual visit completed through MyChart, a video enabled telemedicine application. Due to national recommendations of social distancing due to COVID-19, a virtual visit is felt to be most appropriate for this patient at this time. Reviewed limitations, risks, security and privacy concerns of performing a virtual visit and the availability of in person appointments. I also reviewed that there may be a patient responsible charge related to this service. The patient agreed to proceed.   Patient location: home Provider location: [place] at [facility], office Persons participating in this virtual visit: patient, provider   If any vitals were documented, they were collected by patient at home unless specified below.    Ht 6' 2 (1.88 m)   Wt 245 lb (111.1 kg)   BMI 31.46 kg/m   BP Readings from Last 3 Encounters:  10/18/24 (!) 149/96  09/13/24 124/74  07/20/24 (!) 134/96   CC: URI symptoms Subjective:   HPI: Larry Goodwin is a 48 y.o. male presenting on 12/13/2024 for Acute Visit (Pt states he believes he has a sinus infection/He has sore throat, aches, chills, sinus pressure when coughing, lethargy/Onset Christmas day and worsening since)   6d h/o stuffy feeling progressing to sinus pressure with headache, blowing colored green mucous, sore throat with PNdrainage, chills, body aches, fatigue. Head > chest congestion. Worse sinus headache with cough.   H/o bad recurrent sinus infections, chronic sinusitis in the past.  Latest 07/2024.   Hasn't tried anything for this otherwise besides nasal saline rinse    No fevers, abd pain, nausea, vomiting, diarrhea. No hearing changes.  No sick contacts at home.  Did not receive flu shot this yet.   Was recommended to continue atorvastatin  40mg  daily by cardiologist.    He took home kit for flu A/B and COVID  12/11/2024 and returned negative.     Relevant past medical, surgical, family and social history reviewed and updated as indicated. Interim medical history since our last visit reviewed. Allergies and medications reviewed and updated. Outpatient Medications Prior to Visit  Medication Sig Dispense Refill   aspirin  EC 81 MG tablet Take 1 tablet (81 mg total) by mouth daily. 90 tablet 3   Cholecalciferol (VITAMIN D3) 125 MCG (5000 UT) CAPS Take 1 capsule (5,000 Units total) by mouth in the morning and at bedtime.     Magnesium  Oxide 500 MG CAPS Take 1 capsule (500 mg total) by mouth daily. 30 capsule 2   metoprolol succinate (TOPROL-XL) 25 MG 24 hr tablet Take 25 mg by mouth at bedtime.     nitroGLYCERIN (NITROSTAT) 0.4 MG SL tablet Place 0.4 mg under the tongue every 5 (five) minutes as needed for chest pain.     [START ON 12/21/2024] oxyCODONE  (OXY IR/ROXICODONE ) 5 MG immediate release tablet Take 1 tablet (5 mg total) by mouth every 6 (six) hours as needed for severe pain (pain score 7-10). Must last 30 days 120 tablet 0   oxyCODONE  (OXY IR/ROXICODONE ) 5 MG immediate release tablet Take 1 tablet (5 mg total) by mouth every 6 (six) hours as needed for severe pain (pain score 7-10). Must last 30 days. 120 tablet 0   sildenafil  (VIAGRA ) 100 MG tablet Take 0.5-1 tablets (50-100 mg total) by mouth daily as needed for erectile dysfunction. 10 tablet 11   omeprazole  (PRILOSEC) 20 MG capsule Take 1 capsule (20  mg total) by mouth daily. 90 capsule 0   rosuvastatin  (CRESTOR ) 20 MG tablet Take 1 tablet (20 mg total) by mouth daily. (Patient not taking: Reported on 12/13/2024) 30 tablet 11   No facility-administered medications prior to visit.     Per HPI unless specifically indicated in ROS section below Review of Systems Objective:  Ht 6' 2 (1.88 m)   Wt 245 lb (111.1 kg)   BMI 31.46 kg/m   Wt Readings from Last 3 Encounters:  12/13/24 245 lb (111.1 kg)  10/18/24 245 lb (111.1 kg)  09/13/24 256  lb 6 oz (116.3 kg)       Physical exam: Gen: alert, NAD, not ill appearing Pulm: speaks in complete sentences without increased work of breathing Psych: normal mood, normal thought content      Results for orders placed or performed in visit on 09/05/24  Lab report - scanned   Collection Time: 09/05/24 12:34 PM  Result Value Ref Range   A1c 5.2    Assessment & Plan:   Acute non-recurrent sinusitis, unspecified location Assessment & Plan: Treat for bacterial sinusitis with h/o chronic sinusitis.  Further supportive measures reviewed ie mucinex , fluids, rest.    Epigastric pain Assessment & Plan: Doing well on omeprazole  20mg  daily.  Discussed trial every other day dosing Will refill every day dosing.    Coronary artery disease involving native coronary artery of native heart without angina pectoris Assessment & Plan: Now followed by cardiology Recommended to continue atorva 40mg  daily - refilled today    Other orders -     Amoxicillin -Pot Clavulanate; Take 1 tablet by mouth 2 (two) times daily for 10 days.  Dispense: 20 tablet; Refill: 0 -     Atorvastatin  Calcium ; Take 1 tablet (40 mg total) by mouth daily.  Dispense: 90 tablet; Refill: 3 -     Omeprazole ; Take 1 capsule (20 mg total) by mouth daily. For heartburn  Dispense: 90 capsule; Refill: 3     I discussed the assessment and treatment plan with the patient. The patient was provided an opportunity to ask questions and all were answered. The patient agreed with the plan and demonstrated an understanding of the instructions. The patient was advised to call back or seek an in-person evaluation if the symptoms worsen or if the condition fails to improve as anticipated.  Follow up plan: No follow-ups on file.  Anton Blas, MD   "

## 2024-12-13 NOTE — Assessment & Plan Note (Signed)
 Now followed by cardiology Recommended to continue atorva 40mg  daily - refilled today

## 2024-12-13 NOTE — Assessment & Plan Note (Signed)
 Doing well on omeprazole  20mg  daily.  Discussed trial every other day dosing Will refill every day dosing.

## 2024-12-13 NOTE — Addendum Note (Signed)
 Addended by: RILLA BALLER on: 12/13/2024 09:25 AM   Modules accepted: Level of Service

## 2024-12-20 ENCOUNTER — Encounter: Payer: Self-pay | Admitting: Family Medicine

## 2024-12-20 MED ORDER — AMOXICILLIN-POT CLAVULANATE 875-125 MG PO TABS: 1.0000 | ORAL_TABLET | Freq: Two times a day (BID) | ORAL | 0 refills | Status: AC

## 2025-01-10 NOTE — Progress Notes (Unsigned)
 PROVIDER NOTE: Interpretation of information contained herein should be left to medically-trained personnel. Specific patient instructions are provided elsewhere under Patient Instructions section of medical record. This document was created in part using AI and STT-dictation technology, any transcriptional errors that may result from this process are unintentional.  Patient: Larry Goodwin  Service: E/M   PCP: Rilla Baller, MD  DOB: 15-Jun-1976  DOS: 01/11/2025  Provider: Emmy MARLA Blanch, NP  MRN: [id number]  Delivery: Face-to-face  Specialty: Interventional Pain Management  Type: Established Patient  Setting: Ambulatory outpatient facility  Specialty designation: 09  Referring Prov.: Rilla Baller, MD  Location: Outpatient office facility       History of present illness (HPI) [name], a 49 y.o. year old male, is here today because of his low back pain. [name]'s primary complain today is Back Pain  Pertinent problems: Mr. 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); Cervical central spinal stenosis (C5-6); Thoracic IVDD (intervertebral disc displacement); Thoracic facet arthropathy; Thoracic facet syndrome (HCC); Lumbar IVDD (intervertebral disc displacement); Lumbar foraminal stenosis (Right L4-5); Lumbar central spinal stenosis (L3-4 and L4-5); Chronic knee pain (Right); Osteoarthritis of knee (Right); Chronic  hip pain (2ry area of Pain) (Right); Chronic shoulder pain (4th area of Pain) (Bilateral) (Right); Pharmacologic therapy; Disorder of skeletal system; Problems influencing health status; Uncomplicated opioid dependence (HCC); and Chronic use of opiate for therapeutic purpose on their pertinent problem list.  Pain Assessment: Severity of Chronic pain is reported as a 4 /10. Location: Back Lower/Down Right Leg. Onset: More than a month ago. Quality: Goodwin, Dull. Timing: Goodwin. Modifying factor(s): Laying down. Vitals:  height is 6' 2 (1.88 m) and weight is 250 lb (113.4 kg). His temporal temperature is 97.3 F (36.3 C) (abnormal). His blood pressure is 142/102 (abnormal) and his pulse is 68. His respiration is 20 and oxygen saturation is 100%.  BMI: Estimated body mass index is 32.1 kg/m as calculated from the following:   Height as of this encounter: 6' 2 (1.88 m).   Weight as of this encounter: 250 lb (113.4 kg).  Last encounter: 10/18/2024. Last procedure: Visit date not found.  Reason for encounter: medication management. The patient indicates doing well with current medication regimen. No side effects or adverse reaction reported to medication.   Discussed the use of AI scribe software for clinical note transcription with the patient, who gave verbal consent to proceed.  History of Present Illness   Larry Goodwin is a 49 year old male who presents for low back pain management.  He experiences chronic low back pain that radiates to his hip, describing it as persistent and present all the time, and has undergone four back surgeries. He describes that each surgery has progressively weakened his bones.  He manages his pain with oxycodone , taken every six hours, and reports no side effects or adverse reactions. His pharmacy is [facility], [facility].     Pharmacotherapy Assessment   Oxycodone  (Oxy IR/Roxicodone ) 5 mg immediate release tablet every 6 hours as  needed for severe pain. MME=30 Monitoring: [place] PMP: PDMP reviewed during this encounter.       Pharmacotherapy: No side-effects or adverse reactions reported. Compliance: No problems identified. Effectiveness: Clinically acceptable.  Erlene Doyal SAUN[place], [place]  01/11/2025  8:30 AM  Sign when Signing Visit Nursing Pain Medication Assessment:  Safety precautions to be maintained throughout the outpatient stay will include: orient to surroundings, keep bed in low position, maintain call bell within reach at all times, provide assistance with transfer out of bed and ambulation.  Medication Inspection Compliance: Pill count conducted under aseptic conditions, in front of the patient. Neither the pills nor the bottle was removed from the patient's sight at any time. Once count was completed pills were immediately returned to the patient in their original bottle.  Medication: Oxycodone  IR Pill/Patch Count: 35 of 120  pills/patches remain Pill/Patch Appearance: Markings consistent with prescribed medication Bottle Appearance: Standard pharmacy container. Clearly labeled. Filled Date: 01 / 08 / 2026 Last Medication intake:  Today    UDS:  Summary  Date Value Ref Range Status  04/12/2024 FINAL  Final    Comment:    ==================================================================== [employer] [facility] 13 (MW) ==================================================================== Test                             Result       Flag       Units  Drug Present and Declared for Prescription Verification   Oxycodone                       1878         EXPECTED   ng/mg creat   Oxymorphone                    517          EXPECTED   ng/mg creat   Noroxycodone                   3291         EXPECTED   ng/mg creat   Noroxymorphone                 339          EXPECTED   ng/mg creat    Sources of oxycodone  are scheduled prescription medications.    Oxymorphone, noroxycodone, and noroxymorphone are expected     metabolites of oxycodone . Oxymorphone is also available as a    scheduled prescription medication.  ==================================================================== Test                      Result    Flag   Units      Ref Range   Creatinine              23               mg/dL      >=79 ==================================================================== Declared Medications:  The flagging and interpretation on this report are based on the  following declared medications.  Unexpected results may arise from  inaccuracies in the declared medications.   **Note: The testing scope of this panel includes these medications:   Oxycodone  (Roxicodone )   **Note: The testing scope of this panel does not include the  following reported medications:   Magnesium  (Mag-Ox)  Naloxone  (Narcan )  Nystatin  (Mycostatin )  Sildenafil  (Viagra )  Triamcinolone  (Kenalog )  Vitamin B12  Vitamin D3 ====================================================================  For clinical consultation, please call [phone]. ====================================================================     No results found for: CBDTHCR No results found for: D8THCCBX No results found for: D9THCCBX  ROS  Constitutional: Denies any fever or chills Gastrointestinal: No reported hemesis, hematochezia, vomiting, or acute GI distress Musculoskeletal: low back pain  Neurological: No reported episodes of acute onset apraxia, aphasia, dysarthria, agnosia, amnesia, paralysis, loss of coordination, or loss of consciousness  Medication Review  Magnesium  Oxide -Mg Supplement, Vitamin D3, aspirin  EC, atorvastatin , metoprolol succinate, nitroGLYCERIN, omeprazole , oxyCODONE , and sildenafil   History Review  Allergy: Mr. Vandermeulen is allergic to levofloxacin  and levaquin  [levofloxacin  in d5w]. Drug: Mr. Capell  reports current drug use. Drug: Oxycodone . Alcohol:  reports no history of alcohol use. Tobacco:  reports that he  quit smoking about 17 years ago. His smoking use included cigarettes. He has never used smokeless tobacco. Social: Mr. Hickel  reports that he quit smoking about 17 years ago. His smoking use included cigarettes. He has never used smokeless tobacco. He reports current drug use. Drug: Oxycodone . He reports that he does not drink alcohol. 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 Surgical: Mr. Schulke  has a past surgical history that includes Back surgery (12/14/2010); Cholecystectomy; and Spine surgery (2014). Family: family history includes Arthritis in his mother; COPD in his mother; Hypertension in an other family member; Obesity in his mother.  Laboratory Chemistry Profile   Renal Lab Results  Component Value Date   BUN 21 04/03/2024   CREATININE 0.81 04/03/2024   BCR SEE NOTE: 07/02/2023   GFR 104.22 04/03/2024   GFRAA 129 02/06/2019   GFRNONAA 112 02/06/2019    Hepatic Lab Results  Component Value Date   AST 15 04/03/2024   ALT 14 04/03/2024   ALBUMIN 4.4 04/03/2024   ALKPHOS 76 04/03/2024   LIPASE 153 09/21/2012    Electrolytes Lab Results  Component Value Date   NA 136 04/03/2024   K 4.8 04/03/2024   CL 99 04/03/2024   CALCIUM  9.0 04/03/2024   MG 1.8 04/22/2022    Bone Lab Results  Component Value Date   VD25OH 68.48 04/03/2024   25OHVITD1 35 02/06/2019   25OHVITD2 <1.0 02/06/2019   25OHVITD3 34 02/06/2019    Inflammation (CRP: Acute Phase) (ESR: Chronic Phase) Lab Results  Component Value Date   CRP 9 02/06/2019   ESRSEDRATE 6 02/06/2019         Note: Above Lab results reviewed.  Recent Imaging Review  DG Foot Complete Right CLINICAL DATA:  Jarring foot 3 days prior, no fall  EXAM: RIGHT FOOT COMPLETE - 3+ VIEW  COMPARISON:   Radiograph 03/24/2012  FINDINGS: No acute bony abnormality. Specifically, no fracture, subluxation, or dislocation. Mild degenerative changes are noted in the midfoot. Posterior calcaneal spur is present. Trace ankle effusion. Soft tissues are otherwise unremarkable  IMPRESSION: No acute fracture or traumatic malalignment.  Mild degenerative changes in the midfoot.  Posterior calcaneal spur.  Trace ankle effusion.  Electronically Signed   By: Gretel Edis M.D.   On: 02/12/2020 19:03 Note: Reviewed        Physical Exam  Vitals: BP (!) 142/102 (BP Location: Right Arm, Patient Position: Sitting, Cuff Size: Large)   Pulse 68   Temp (!) 97.3 F (36.3 C) (Temporal)   Resp 20   Ht 6' 2 (1.88 m)  Wt 250 lb (113.4 kg)   SpO2 100%   BMI 32.10 kg/m  BMI: Estimated body mass index is 32.1 kg/m as calculated from the following:   Height as of this encounter: 6' 2 (1.88 m).   Weight as of this encounter: 250 lb (113.4 kg). Ideal: Ideal body weight: 82.2 kg (181 lb 3.5 oz) Adjusted ideal body weight: 94.7 kg (208 lb 11.7 oz) General appearance: Well nourished, well developed, and well hydrated. In no apparent acute distress Mental status: Alert, oriented x 3 (person, place, & time)       Respiratory: No evidence of acute respiratory distress Eyes: PERLA  Musculoskeletal: +LBP  Assessment   Diagnosis Status  1. Chronic low back pain (1ry area of Pain) (Bilateral) (R>L)   2. Chronic hip pain (2ry area of Pain) (Right)   3. Pharmacologic therapy   4. Chronic upper back pain (3ry area of Pain)   5. Chronic shoulder pain (4th area of Pain) (Bilateral) (Right)   6. Chronic sacroiliac joint pain (Right)   7. Chronic knee pain (Right)   8. Chronic pain syndrome   9. Chronic use of opiate for therapeutic purpose    Controlled Controlled Controlled   Updated Problems: No problems updated.  Plan of Care  Problem-specific:  Assessment and Plan    Chronic low back  pain Previous surgeries worsened condition. Further surgery not advised due to bone weakening risk. Managed with medication. - Continue current pain management regimen.  Chronic opioid therapy for pain management Oxycodone  every six hours. No side effects. Pain contract renewal on February 7th. - Sent oxycodone  prescription to pharmacy. - Renewed pain contract on February 7th. - Advised to contact pharmacy to fill prescription one day early if necessary due to weather issues.   Pharmacologic therapy: Patient's pain is controlled with oxycodone , will continue on current medication regimen.  Prescribed oxycodone  (PMP) reviewed, findings consistent with the use of prescribed medication and no evidence of narcotic misuse or abuse.  Urine drug screening (UDS) up to date and with the use of prescribed medication.  No side effects or adverse reaction reported to medication.  The patient was advised to walk for at least 30 minutes to improve mobility.  Schedule follow-up in 90 days for medication management.      Mr. HAAKON TITSWORTH has a current medication list which includes the following long-term medication(s): atorvastatin , magnesium  oxide -mg supplement, metoprolol succinate, nitroglycerin, omeprazole , sildenafil , [START ON 01/20/2025] oxycodone , [START ON 02/19/2025] oxycodone , and [START ON 03/21/2025] oxycodone .  Pharmacotherapy (Medications Ordered): Meds ordered this encounter  Medications   oxyCODONE  (OXY IR/ROXICODONE ) 5 MG immediate release tablet    Sig: Take 1 tablet (5 mg total) by mouth every 6 (six) hours as needed for severe pain (pain score 7-10). Must last 30 days    Dispense:  120 tablet    Refill:  0    DO NOT: delete (not duplicate); no partial-fill (will deny script to complete), no refill request (F/U required). DISPENSE: 1 day early if closed on fill date. WARN: No CNS-depressants within 8 hrs of med.   oxyCODONE  (OXY IR/ROXICODONE ) 5 MG immediate release tablet    Sig: Take  1 tablet (5 mg total) by mouth every 6 (six) hours as needed for severe pain (pain score 7-10). Must last 30 days    Dispense:  120 tablet    Refill:  0    DO NOT: delete (not duplicate); no partial-fill (will deny script to complete), no refill request (  F/U required). DISPENSE: 1 day early if closed on fill date. WARN: No CNS-depressants within 8 hrs of med.   oxyCODONE  (OXY IR/ROXICODONE ) 5 MG immediate release tablet    Sig: Take 1 tablet (5 mg total) by mouth every 6 (six) hours as needed for severe pain (pain score 7-10). Must last 30 days    Dispense:  120 tablet    Refill:  0    DO NOT: delete (not duplicate); no partial-fill (will deny script to complete), no refill request (F/U required). DISPENSE: 1 day early if closed on fill date. WARN: No CNS-depressants within 8 hrs of med.   Orders:  No orders of the defined types were placed in this encounter.      Return in about 3 months (around 04/11/2025) for (F2F), (MM), Emmy Blanch NP.    Recent Visits Date Type Provider Dept  10/18/24 Office Visit Beaulah Romanek K, NP [facility]  Showing recent visits within past 90 days and meeting all other requirements Today's Visits Date Type Provider Dept  01/11/25 Office Visit Olivia Royse K, NP [facility]  Showing today's visits and meeting all other requirements Future Appointments Date Type Provider Dept  04/03/25 Appointment Alyha Marines K, NP [facility]  Showing future appointments within next 90 days and meeting all other requirements  I discussed the assessment and treatment plan with the patient. The patient was provided an opportunity to ask questions and all were answered. The patient agreed with the plan and demonstrated an understanding of the instructions.  Patient advised to call back or seek an in-person evaluation if the symptoms or condition worsens.  I personally spent a total of 30 minutes in the care of the patient today including  preparing to see the patient, getting/reviewing separately obtained history, performing a medically appropriate exam/evaluation, counseling and educating, placing orders, referring and communicating with other health care professionals, documenting clinical information in the EHR, independently interpreting results, communicating results, and coordinating care.  Note by: Dillan Candela K Rahima Fleishman, NP (TTS and AI technology used. I apologize for any typographical errors that were not detected and corrected.) Date: 01/11/2025; Time: 8:45 AM

## 2025-01-11 ENCOUNTER — Ambulatory Visit: Attending: Nurse Practitioner | Admitting: Nurse Practitioner

## 2025-01-11 ENCOUNTER — Encounter: Payer: Self-pay | Admitting: Nurse Practitioner

## 2025-01-11 DIAGNOSIS — M25551 Pain in right hip: Secondary | ICD-10-CM | POA: Insufficient documentation

## 2025-01-11 DIAGNOSIS — M549 Dorsalgia, unspecified: Secondary | ICD-10-CM | POA: Diagnosis present

## 2025-01-11 DIAGNOSIS — Z79899 Other long term (current) drug therapy: Secondary | ICD-10-CM | POA: Diagnosis present

## 2025-01-11 DIAGNOSIS — Z79891 Long term (current) use of opiate analgesic: Secondary | ICD-10-CM | POA: Diagnosis present

## 2025-01-11 DIAGNOSIS — M545 Low back pain, unspecified: Secondary | ICD-10-CM | POA: Diagnosis present

## 2025-01-11 DIAGNOSIS — M533 Sacrococcygeal disorders, not elsewhere classified: Secondary | ICD-10-CM | POA: Insufficient documentation

## 2025-01-11 DIAGNOSIS — M25511 Pain in right shoulder: Secondary | ICD-10-CM | POA: Diagnosis present

## 2025-01-11 DIAGNOSIS — G894 Chronic pain syndrome: Secondary | ICD-10-CM | POA: Diagnosis present

## 2025-01-11 DIAGNOSIS — M25512 Pain in left shoulder: Secondary | ICD-10-CM | POA: Insufficient documentation

## 2025-01-11 DIAGNOSIS — G8929 Other chronic pain: Secondary | ICD-10-CM | POA: Diagnosis present

## 2025-01-11 DIAGNOSIS — M25561 Pain in right knee: Secondary | ICD-10-CM | POA: Insufficient documentation

## 2025-01-11 MED ORDER — OXYCODONE HCL 5 MG PO TABS: 5.0000 mg | ORAL_TABLET | Freq: Four times a day (QID) | ORAL | 0 refills | Status: AC | PRN

## 2025-01-11 NOTE — Patient Instructions (Signed)
 " ______________________________________________________________________    Opioid Pain Medication Update  To: All patients taking opioid pain medications. (I.e.: hydrocodone, hydromorphone, oxycodone , oxymorphone, morphine, codeine, methadone, tapentadol, tramadol, buprenorphine, fentanyl, etc.)  Re: Updated review of side effects and adverse reactions of opioid analgesics, as well as new information about long term effects of this class of medications.  Direct risks of long-term opioid therapy are not limited to opioid addiction and overdose. Potential medical risks include serious fractures, breathing problems during sleep, hyperalgesia, immunosuppression, chronic constipation, bowel obstruction, myocardial infarction, and tooth decay secondary to xerostomia.  Unpredictable adverse effects that can occur even if you take your medication correctly: Cognitive impairment, respiratory depression, and death. Most people think that if they take their medication correctly, and as instructed, that they will be safe. Nothing could be farther from the truth. In reality, a significant amount of recorded deaths associated with the use of opioids has occurred in individuals that had taken the medication for a long time, and were taking their medication correctly. The following are examples of how this can happen: Patient taking his/her medication for a long time, as instructed, without any side effects, is given a certain antibiotic or another unrelated medication, which in turn triggers a Drug-to-drug interaction leading to disorientation, cognitive impairment, impaired reflexes, respiratory depression or an untoward event leading to serious bodily harm or injury, including death.  Patient taking his/her medication for a long time, as instructed, without any side effects, develops an acute impairment of liver and/or kidney function. This will lead to a rapid inability of the body to breakdown and eliminate  their pain medication, which will result in effects similar to an overdose, but with the same medicine and dose that they had always taken. This again may lead to disorientation, cognitive impairment, impaired reflexes, respiratory depression or an untoward event leading to serious bodily harm or injury, including death.  A similar problem will occur with patients as they grow older and their liver and kidney function begins to decrease as part of the aging process.  Background information: Historically, the original case for using long-term opioid therapy to treat chronic noncancer pain was based on safety assumptions that subsequent experience has called into question. In 1996, the American Pain Society and the American Academy of Pain Medicine issued a consensus statement supporting long-term opioid therapy. This statement acknowledged the dangers of opioid prescribing but concluded that the risk for addiction was low; respiratory depression induced by opioids was short-lived, occurred mainly in opioid-naive patients, and was antagonized by pain; tolerance was not a common problem; and efforts to control diversion should not constrain opioid prescribing. This has now proven to be wrong. Experience regarding the risks for opioid addiction, misuse, and overdose in community practice has failed to support these assumptions.  According to the Centers for Disease Control and Prevention, fatal overdoses involving opioid analgesics have increased sharply over the past decade. Currently, more than 96,700 people die from drug overdoses every year. Opioids are a factor in 7 out of every 10 overdose deaths. Deaths from drug overdose have surpassed motor vehicle accidents as the leading cause of death for individuals between the ages of 35 and 90.  Clinical data suggest that neuroendocrine dysfunction may be very common in both men and women, potentially causing hypogonadism, erectile dysfunction, infertility,  decreased libido, osteoporosis, and depression. Recent studies linked higher opioid dose to increased opioid-related mortality. Controlled observational studies reported that long-term opioid therapy may be associated with increased risk for cardiovascular events.  Subsequent meta-analysis concluded that the safety of long-term opioid therapy in elderly patients has not been proven.   Side Effects and adverse reactions: Common side effects: Drowsiness (sedation). Dizziness. Nausea and vomiting. Constipation. Physical dependence -- Dependence often manifests with withdrawal symptoms when opioids are discontinued or decreased. Tolerance -- As you take repeated doses of opioids, you require increased medication to experience the same effect of pain relief. Respiratory depression -- This can occur in healthy people, especially with higher doses. However, people with COPD, asthma or other lung conditions may be even more susceptible to fatal respiratory impairment.  Uncommon side effects: An increased sensitivity to feeling pain and extreme response to pain (hyperalgesia). Chronic use of opioids can lead to this. Delayed gastric emptying (the process by which the contents of your stomach are moved into your small intestine). Muscle rigidity. Immune system and hormonal dysfunction. Quick, involuntary muscle jerks (myoclonus). Arrhythmia. Itchy skin (pruritus). Dry mouth (xerostomia).  Long-term side effects: Chronic constipation. Sleep-disordered breathing (SDB). Increased risk of bone fractures. Hypothalamic-pituitary-adrenal dysregulation. Increased risk of overdose.  RISKS: Respiratory depression and death: Opioids increase the risk of respiratory depression and death.  Drug-to-drug interactions: Opioids are relatively contraindicated in combination with benzodiazepines, sleep inducers, and other central nervous system depressants. Other classes of medications (i.e.: certain antibiotics  and even over-the-counter medications) may also trigger or induce respiratory depression in some patients.  Medical conditions: Patients with pre-existing respiratory problems are at higher risk of respiratory failure and/or depression when in combination with opioid analgesics. Opioids are relatively contraindicated in some medical conditions such as central sleep apnea.   Fractures and Falls:  Opioids increase the risk and incidence of falls. This is of particular importance in elderly patients.  Endocrine System:  Long-term administration is associated with endocrine abnormalities (endocrinopathies). (Also known as Opioid-induced Endocrinopathy) Influences on both the hypothalamic-pituitary-adrenal axis?and the hypothalamic-pituitary-gonadal axis have been demonstrated with consequent hypogonadism and adrenal insufficiency in both sexes. Hypogonadism and decreased levels of dehydroepiandrosterone sulfate have been reported in men and women. Endocrine effects include: Amenorrhoea in women (abnormal absence of menstruation) Reduced libido in both sexes Decreased sexual function Erectile dysfunction in men Hypogonadisms (decreased testicular function with shrinkage of testicles) Infertility Depression and fatigue Loss of muscle mass Anxiety Depression Immune suppression Hyperalgesia Weight gain Anemia Osteoporosis Patients (particularly women of childbearing age) should avoid opioids. There is insufficient evidence to recommend routine monitoring of asymptomatic patients taking opioids in the long-term for hormonal deficiencies.  Immune System: Human studies have demonstrated that opioids have an immunomodulating effect. These effects are mediated via opioid receptors both on immune effector cells and in the central nervous system. Opioids have been demonstrated to have adverse effects on antimicrobial response and anti-tumour surveillance. Buprenorphine has been demonstrated to have  no impact on immune function.  Opioid Induced Hyperalgesia: Human studies have demonstrated that prolonged use of opioids can lead to a state of abnormal pain sensitivity, sometimes called opioid induced hyperalgesia (OIH). Opioid induced hyperalgesia is not usually seen in the absence of tolerance to opioid analgesia. Clinically, hyperalgesia may be diagnosed if the patient on long-term opioid therapy presents with increased pain. This might be qualitatively and anatomically distinct from pain related to disease progression or to breakthrough pain resulting from development of opioid tolerance. Pain associated with hyperalgesia tends to be more diffuse than the pre-existing pain and less defined in quality. Management of opioid induced hyperalgesia requires opioid dose reduction.  Cancer: Chronic opioid therapy has been associated with an increased  risk of cancer among noncancer patients with chronic pain. This association was more evident in chronic strong opioid users. Chronic opioid consumption causes significant pathological changes in the small intestine and colon. Epidemiological studies have found that there is a link between opium dependence and initiation of gastrointestinal cancers. Cancer is the second leading cause of death after cardiovascular disease. Chronic use of opioids can cause multiple conditions such as GERD, immunosuppression and renal damage as well as carcinogenic effects, which are associated with the incidence of cancers.   Mortality: Long-term opioid use has been associated with increased mortality among patients with chronic non-cancer pain (CNCP).  Prescription of long-acting opioids for chronic noncancer pain was associated with a significantly increased risk of all-cause mortality, including deaths from causes other than overdose.  Reference: Von Korff M, Kolodny A, Deyo RA, Chou R. Long-term opioid therapy reconsidered. Ann Intern Med. 2011 Sep 6;155(5):325-8. doi:  10.7326/[id number]. PMID: 78106373; PMCID: [id number]. Kit JINNY Laurence CINDERELLA Pearley JINNY, Hayward RA, Dunn KM, Jordan KP. Risk of adverse events in patients prescribed long-term opioids: A cohort study in the [place] Clinical Practice Research [employer]. Eur J Pain. 2019 May;23(5):908-922. doi: 10.1002/ejp.1357. Epub 2019 Jan 31. PMID: 69379883. Colameco S, Coren JS, Ciervo CA. Continuous opioid treatment for chronic noncancer pain: a time for moderation in prescribing. Postgrad Med. 2009 Jul;121(4):61-6. doi: 10.3810/pgm.[id number]. PMID: 80358728. Gigi JONELLE Shlomo MILUS Levern IVER Conny RN, Klukwan SD, Blazina I, Lonell DASEN, Bougatsos C, Deyo RA. The effectiveness and risks of long-term opioid therapy for chronic pain: a systematic review for a [employer] of [employer] Pathways to [employer]. Ann Intern Med. 2015 Feb 17;162(4):276-86. doi: 10.7326/M14-2559. PMID: 74418742. Rory CHRISTELLA [facility] [place], Makuc DM. NCHS Data Brief No. 22. [place]: Centers for Disease Control and Prevention; 2009. Sep, Increase in Fatal Poisonings Involving Opioid Analgesics in the [place] , 1999-2006. Song IA, Choi HR, Oh TK. Long-term opioid use and mortality in patients with chronic non-cancer pain: Ten-year follow-up study in [place] from 2010 through 2019. EClinicalMedicine. 2022 Jul 18;51:101558. doi: 10.1016/j.eclinm.[id number]. PMID: 64124182; PMCID: [id number]. Huser, W., Schubert, T., Vogelmann, T. et al. All-cause mortality in patients with long-term opioid therapy compared with non-opioid analgesics for chronic non-cancer pain: a database study. BMC Med 18, 162 (2020). [email or web address].info/ Rashidian H, Zendehdel K, Kamangar F, Malekzadeh R, Haghdoost AA. An Ecological Study of the Association between Opiate Use and Incidence of Cancers. Addict Health. 2016 Fall;8(4):252-260. PMID: 71180443; PMCID: [id number].  Our Goal: Our goal is to control your pain with means other  than the use of opioid pain medications.  Our Recommendation: Talk to your physician about coming off of these medications. We can assist you with the tapering down and stopping these medicines. Based on the new information, even if you cannot completely stop the medication, a decrease in the dose may be associated with a lesser risk. Ask for other means of controlling the pain. Decrease or eliminate those factors that significantly contribute to your pain such as smoking, obesity, and a diet heavily tilted towards inflammatory nutrients.  Last Updated: 06/21/2023   ______________________________________________________________________       ______________________________________________________________________    Update on Controlled Substance (Opioid) Regulations   To: All patients taking opioid pain medications. (I.e.: hydrocodone, hydromorphone, oxycodone , oxymorphone, morphine, codeine, methadone, tapentadol, tramadol, buprenorphine, fentanyl, etc.)  Re: Review on the state of controlled substance regulations.  Introduction: Rules and regulations associated with all aspects of controlled substances are constantly being modified. Unfortunately we have encountered patients questioning the veracity of the  information that we provide them about these changes. This is intended to provide them with appropriate references and a historical review of these changes.  A Brief History: As of September 28, 2016, the [place]  Government declared the opioid epidemic a public health emergency. Prescription drug monitoring programs (PDMPs) and the [facility] All Schedules Prescription Electronic Reporting Act (NASPER). Before 1800, clinicians regarded pain as an existential phenomenon, a consequence of aging. There was no regulation on the use of cocaine and opioids, resulting in widespread marketing and prescribing for many ailments ranging from diarrhea to toothache. The [employer] of  [phone], passed in response to the sudden emergence of street heroin abuse as well as iatrogenic morphine dependence, influenced both physician and patient alike to avoid opiates. Patients with unexplained pain in the 1920s were regarded as deluded, malingering, or abusers, and cancer patients through the 1950s were encouraged to wean themselves off opioids until their lives could be measured in weeks. Alongside this opioid evolution, the American Pain Society launched their influential pain as the fifth vital sign campaign in 1995. Concurrently, pharmaceutical companies introduced new formulations, such as extended release oxycodone  (OxyContin ). From 1997 to 2002, OxyContin  prescriptions increased from 670,000 to 6.2 million. However, concerns soon began to surface regarding overzealous opioid treatment. It must be noted that pharmaceutical companies contributed significantly to the rise of the opioid epidemic, receiving considerable reprimands as a consequence. In 2007, as the opioid epidemic began to inflict profound damage, [employer] pleaded guilty to federal charges related to the misbranding of OxyContin . [employer] agreed to pay a total of $634.5 million to resolve Justice Department investigations, as well as a $19.5 million settlement to [address] and the [address] of [place].  In response to the current epidemic, changes in focus to the development of new abuse deterrent opioid formulations at the [place]  Food and Drug Administration (FDA) as well as drafting of new public standards for pain treatment were created at [facility] in 2017. In response to the opioid epidemic, FDA public policy changes were announced in February 2016. Among these new positions were a re-examination of the risk-benefit paradigm for opioids with strict emphasis on the large public health ramifications. The various modified opioids released over the past 20 years, such as tamper-resistant preparation, have had differing levels of success,  and are collectively referred to as Risk Evaluation and Mitigation Strategies (REMS). There is also a growing focus on preventing opioid use disorder (OUD) and on offering affected individuals accessible and effective treatment. [place]  government policy reflects these changes and both the Affordable Care Act and the Mental Health Parity and Addiction Equity Act were major steps forward in treating opioid addiction. The Affordable Care Act, which was signed into law in 2010, with major provisions coming into effect by 2014.  In the 1990s, the intensified marketing of newly reformulated prescription opioid medications (e.g., OxyContin ) and an influential pain advocacy campaign that encouraged greater pain management led to a precipitous rise in opioid use in the [place] . Research from the Centers for Disease Control and Prevention (CDC) shows that prescription opioid sales in the [place]  quadrupled from 1999 to 2010. At the same time, opioid misuse and opioid-involved overdose deaths increased (Figure 1). Between 1999 and 2010, the rate of opioidinvolved overdose deaths in the [place]  doubled from 2.9 to 6.8 deaths per 100,000 people. This initial rise in opioid-related deaths is often referred to as the first wave of the recent opioid crisis.  Between 1999 and 2020,  565,000 Americans died of opioid-involved overdoses. In turn, federal, state, and local governments responded with various legal and policy efforts to curb opioid misuse and drug-related overdose Deaths.  Recent Congresses have enacted several laws addressing the opioid crisis, such as the Comprehensive Addiction and Recovery Act of 2016 (CARA, P.L. [phone]); the 21st Century Cures Act (P.L. 114-255); the Substance UseDisorder Prevention that Promotes Opioid Recovery and Treatment for Patients and Communities Act (SUPPORT Act, P.L. [phone]); the Fentanyl Sanctions Act (Title LXXII of P.L. [id number]); and the Blocking  Deadly Fentanyl Imports Act (P.L. 117-81, 6610). These laws addressed overprescribing and misuse of opioids, expanded substance use disorder prevention and treatment capacities, bolstered drug diversion capabilities, and enhanced international drug interdiction, counternarcotics cooperation, and sanctions efforts. Congress also directed additional funds to many of these initiatives through appropriations.  Congress provided funding in the [employer] Act of 2021 [phone]; P.L. 117-2) for syringe services programs (often known as needle exchange programs) and other harm reduction initiatives. Federal and state harm reduction strategies have frequently involved the distribution of naloxone  (e.g., Narcan )--a medication used to reverse an opioid overdose--and test strips used to detect fentanyl in drug samples.  The Department of Justice (DOJ) and Department of Homeland Security [facility]) aim to reduce the diversion of prescription opioids and the use, manufacturing, and trafficking of illicit opioids. DOJ--via the Drug Enforcement Administration (DEA)--regulates opioid manufacturers, distributors, and dispensers; it also controls the opioid supply through enforcement of regulatory requirements.  A History of Opiate Laws in the [place]   Prior to 1890, laws concerning opiates were strictly imposed on a local city or state-by-state basis. One of the first was in [place] in 1875 where it became illegal to smoke opium only in opium dens. It did not ban the sale, import or use otherwise. In the next 25 years different states enacted opium laws ranging from outlawing opium dens altogether to making possession of opium, morphine and heroin without a physicians prescription illegal.  The first Congressional Act took place in 1890 that levied taxes on morphine and opium. From that time on the [employer] has had a series of laws and acts directly aimed at opiate use, abuse  and control. These are outlined below:  1906 - Pure Food and Drug Act Preventing the manufacture, sale, or transportation of adulterated or misbranded or poisonous or deleterious foods, drugs, medicines, and liquors, and for regulating traffic therein, and for other purposes. Punishment included fines and prison time.  1909   - Smoking Opium Exclusion Act Banned the importation, possession and use of smoking opium. Did not regulate opium-based medications. First Freight forwarder banning the non-medical use of a substance.  1914  - The Margrette Act In summary, The Margrette Act of 1914 was written more to have all parties involved in importing, exporting, set designer and distributing opium or cocaine to register with the [employer] and have taxes levied upon them. Exempt from the law were physicians operating in the course of his professional practice  1919 - Supreme Court ratified the Bj's in [place] et al., v. [place]  and [place]  v. [place], then again in [facility] v. [place] , in 1920, holding that doctors may not prescribe maintenance supplies of narcotics to people addicted to narcotics. However, it does not prohibit doctors from prescribing narcotics to wean a patient off of the drug. It was also the opinion of the court that prescribing narcotics to habitual users was not considered professional practice hence  it then was considered illegal for doctors to prescribe opioids for the purposes of maintaining an addiction. It can be argued that todays addiction medications are not intended to maintain an addiction but to facilitate addiction remission. In which case, this opinion of the court should not preclude practitioners from prescribing buprenorphine or methadone to patients suffering from an addictive disorder.  1924  - Heroin Act Architectural technologist, importation and possession of heroin illegal - even for medicinal use.  1922 -- Narcotic  Drug Import and Export Act Enacted to assure proper control of importation, sale, possession, production and consumption of narcotics.  1927  -- Special Educational Needs Teacher of [employer] of Prohibition was responsible for tracking bootleggers and organized conservation officer, historic buildings. They focused primarily on interstate and international cases and those cases where local law enforcement official would not or could not act.  1932 -- Uniform State Narcotic Act Encouraged states to pass uniform state laws matching the federal Narcotic Drug Import and Export Act. Suggested prohibiting cannabis use at the state level.  31 -- Food, Drug, and Cosmetic Act The new law brought cosmetics and medical devices under control, and it required that drugs be labeled with adequate directions for safe use. Moreover, it mandated pre-market approval of all new drugs, such that a manufacturer would have to prove to FDA that a drug were safe before it could be sold  1951 -- Boggs Act Imposed maximum criminal penalties for violations of the import/export and internal revenue laws related to drugs and also established mandatory minimum prison sentences.  1956 -- Narcotics Control Act Increased Boggs Act penalties and mandatory prison sentence minimums for violations of existing drug laws.  1965 -- Drug Abuse Control Amendment Enacted to deal with problems caused by abuse of depressants, stimulants and hallucinogens. Restricted research into psychoactive drugs such as LSD by requiring FDA approval.  1970 -- Controlled Substance Act  Controlled Substances Import and Export Act These laws are a consolidation of numerous laws regulating the manufacture and distribution of narcotics, stimulants, depressants, hallucinogens, anabolic steroids, and chemicals used in the illicit production of controlled substances. The CSA places all substances that are regulated under existing federal law into one of five schedules. This placement is based upon  the substance's medicinal value, harmfulness, and potential for abuse or addiction. Schedule I is reserved for the most dangerous drugs that have no recognized medical use, while Schedule V is the classification used for the least dangerous drugs. The act also provides a mechanism for substances to be controlled, added to a schedule, decontrolled, removed from control, rescheduled, or transferred from one schedule to another.  19 - Drug Enforcement Agency By Executive Order, the DEA was formed to take place of the [employer] of Narcotics and Dangerous Drugs.  70 -Narcotic Addict Treatment Act of  1974  - Public Law [phone] Amends the Controlled Substance Act of 1970 to provide for the registration of practitioners conducting narcotic treatment programs. [methadone clinics] It also provides legal definitions for the phrases maintenance treatment and detoxification treatment.  1986 -- Anti-Drug Abuse Act of 1986 Strengthened Federal efforts to encourage foreign cooperation in eradicating illicit drug crops and in halting international drug traffic, to improve enforcement of Federal drug laws and enhance interdiction of illicit drug shipments, to provide strong Market researcher in establishing effective drug abuse prevention and education programs, to [employer] support for drug abuse treatment and rehabilitation efforts, and for other purposes. It also re-imposed mandatory sentencing minimums depending on which drug and  how much was involved.  1988 -- Anti-Drug Abuse Act of 1988 Established the Office of Materials Engineer (ONDCP) in the [employer] of the Economist; authorized funds for [employer], state and local drug enforcement activities, school-based drug prevention efforts, and drug abuse treatment with special emphasis on injecting drug abusers at high risk for AIDS.  2000 -- Federal - The Drug Addiction Treatment Act of 2000 (DATA 2000) It enables qualified physicians to  prescribe and/or dispense narcotics for the purpose of treating opioid dependency. For the first time, physicians are able to treat this disease from their private offices or other clinical settings. This presents a very desirable treatment option for those who are unwilling or unable to seek help in drug treatment clinics. Patients can now be treated in the privacy of their doctors office, as are other people being treated for any other type of medical condition. One medicine doctors may now prescribe is Buprenorphine. The major downfall of this Act is the limitation of 30 patients per practice - which means that large facilities, no matter how many physicians are there, can only treat 30 patients at a time.  2002-- DEA reschedules buprenorphine from a schedule V drug to a schedule III drug, on September 19, 2001 - the day before the FDA approval of Suboxone and Subutex despite overwhelming objection by the medical community.  2004: June 2004 THE CONFIDENTIALITY OF ALCOHOL AND DRUG ABUSE PATIENT RECORDS REGULATION AND THE HIPAA PRIVACY RULE:  Confidentiality of Alcohol and Drug Dependence Patient Records (summary) Code of Federal Regulations Title 42 Part 2 (42 CFR Part 2)  The confidentiality of alcohol and drug dependence patient records maintained by this practice/program is protected by federal law and regulations. Generally, the practice/program may not say to a person outside the practice/program that a patient attends the practice/program, or disclose any information identifying a patient as being alcohol or drug dependent unless:  The patient consents in writing; The disclosure is allowed by a court order, or The disclosure is made to medical personnel in a medical emergency or to qualified personnel for research,  audit, or practice/program evaluation. Violation of the federal law and regulations by a practice/program is a crime. Suspected violations may be reported to appropriate authorities  in accordance with federal regulations. Freight forwarder and regulations do not protect any information about a crime committed by a patient either at the practice/program or against any person who works for the practice/program or about any threat to commit such a crime. Federal laws and regulations do not protect any information about suspected child abuse or neglect from being reported under state law to appropriate state or local authorities.  sample consent form (MS-WORD)  2005: 07-15-2004 Public law [phone], Amends the Controlled Substances Act to eliminate the 30-patient limit for medical group practices allowed to dispense narcotic drugs in schedules III, IV, or V for maintenance or detoxification treatment (retains the 30-patient limit for an individual physician). This amendment removes the 30-patient limit on group medical practices that treat opioid dependence with buprenorphine. The restriction was part of the original Drug Addiction Treatment Act of 2000 (DATA) that allowed treatment of opioid dependence in a doctor's office. With this change, every certified doctor may now prescribe buprenorphine up to his or her individual physician limit of 30 patients.  2006: On 12/11/2005 President Levy signed Bill H.R.6344 into law. This allows physicians who have been certified to prescribe certain drugs for the treatment of opioid dependence under DATA2000 to treat up  to 100 patients (up from 30) by submitting an intent notification to the Dept of Health and [employer]. This is a major step forward in both fighting the stigma and allowing access to treatment previously not available to some. For more details see 30/100-PATIENT LIMIT  2016: HHS augments regulations concerning the 30/100 patient limit by raising the limit to 275 for qualifying physicians. Link to summary of regulation  2016: Comprehensive Addiction and Recovery Act of 2016 (sec.303) amends the Controlled Substance Act - to allow Nurse  Practitioners and Physician Assistants to become eligible to prescribe buprenorphine for the treatment of opioid use disorder. See the entire law for more details.  The roots of the concurrent regulation of certain drugs under two statutory schemes go back to the beginning of this century. In 1906, Congress enacted the Pure Food and Drug Act, establishing one regime of regulation to assure (among other things) that drugs were not adulterated or misbranded. These regulations were amended several times, recodified in 1938, and expanded on again from the 1940s through the 1990s. Their implementation and enforcement is today assigned to the Food and Drug Administration (FDA) in the Department of Health and Human Services [facility]).  In 1914, Congress adopted the [place] Narcotic Act to stop abuse of addictive drugs. The [place] Narcotic Act was amended in 1937 to include marijuana. In 1965, amphetamines, barbiturates, and hallucinogens came under regulation, but under the [employer], Drug, and Cosmetic Act. In 1970, these various statutes were consolidated and recodified as the Controlled Substances Act (CSA), which has been amended several times since then. Its implementation and enforcement is today assigned to the Drug Enforcement Administration (DEA) in the Department of Justice.  The first clash occurred after World War I, when so-called morphine clinics existed and physicians prescribed or dispensed morphine to addicts. Some addicts were veterans of the American Civil War, the Spanish-American War, and WWI, who had become addicted during treatment for war wounds, but most of them came from the growing population of nonmedical addicts ([place], 8017). The Narcotics Division of the Prohibition Unit of the Department of the Treasury, which was then responsible for enforcing the [facility] Narcotic Act, concluded that this activity was not the legitimate practice of medicine but simple drug trafficking. The  Treasury Department swiftly closed the clinics and made it personally and professionally risky for physicians to maintain a narcotic addict for any reason. In did so, however, only after the American Medical Association had adopted a resolution, in 1920, opposing ambulatory clinics''.  In 1972, the public health establishment, including the Secretary of Health, Education, and Welfare, the Education Officer, Environmental, the [employer] of [employer], and the Chemical Engineer for Drug Abuse Prevention, was unprepared to allow [employer] of Narcotics and Dangerous Drugs, DEA's predecessor agency, to unilaterally define the parameters of medical practice for the use of methadone in the treatment of heroin addiction. As a consequence, a new set of rules--the third, on top of the FDA and DEA schemes--was added, one that inserted FDA deeply into the practice of medicine, notwithstanding its protestations to the contrary. Congress ratified this joint responsibility of law enforcement and public health officials for methadone through this third set of rules in 1974 with the passage of the Narcotic Addict Treatment Act (NATA). To examine in detail the evolution of this third set of rules--commonly referred to as the FDA or DHHS methadone regulations--we turn, first, to the period of the mid-1960s.  Increased use of heroin in  the post World War II period first became apparent in the early to mid 1950s. During the [employer], a minimum mandatory narcotics law was enacted in 1956, effective July 1957. 1962 [facility] conference on drug abuse, the [employer] on Narcotic and Drug Abuse (the [employer]) of 1963, the Drug Abuse Control Amendments of 1965, the President's Commission on [employer] and Administration of Justice (the [employer]) of [phone], and the Narcotic Addiction Rehabilitation Act of 1966.  The 1965 Drug Abuse Control Amendments  brought under strict federal control all nonnarcotic drugs capable of producing serious psychotoxic effects when abused. This act also created the [employer] of Drug Abuse Control within the Department of Health, Education, and Welfare (DHEW) and shifted the basis for aon corporation of illegal drugs from tax principles (administered by the Department of Treasury) to the regulation of commerce (administered by the [employer]).  The 1966 Narcotic Addiction Rehabilitation Act TOUR MANAGER) authorized the civil commitment of narcotic addicts, and federal assistance to state and local governments to develop a local system of drug treatment programs. With respect to the latter, the [employer] of Mental Health [facility]) initially proposed the gradual implementation of the state assistance effort, mainly through a common mental health mechanism--inpatient treatment programs. However, because of a perceived pressing need, the courts began to commit addicts to these programs even before they were officially opened or staffed. The NARA legislation imposed the following contract requirements on treatment centers: (1) thrice-a-week counseling sessions; (2) weekly urine tests; (3) restorative dental services; (4) psychological consultations and vocational training; and (5) the treatment modalities of drug-free outpatient, therapeutic community, and methadone maintenance. Reorganization Plan No. 1 of 1968 transferred the primary functions of the [employer] of Narcotics (FBN) from the [employer] to the Department of Justice; it also transferred the [employer] of Drug Abuse Control functions to the Department of Justice. Within the [employer], the [employer] of Narcotics and Dangerous Drugs (BNDD) was created, which became the Drug Enforcement Administration in 1973.   Under the first Buckingham administration [phone]), federal drug abuse policy developed in a significant way. These developments included a 1969 war  on drugs presidential message, resulting legislation in 1970, and a Special Action Office created by executive order in 1971 and authorized in statute in 1972. Brynn, in 1969, to send a message to Congress on drug abuse. Although this was the first time that a U.S. president invoked the war on drugs image, it was in retrospect the most balanced approach to the problem of drug abuse that had been advanced. The 1969 message resulted in the submission of legislation to the Congress and the passage, the following year, of the Comprehensive Drug Abuse Prevention and Control Act of 1970 [employer] [phone], October 09, 1969). The act dealt with research, treatment, and prevention of drug abuse and drug dependence, and with drug abuse charity fundraiser. One major purpose of the 1970 legislation was to reverse some of the strictures of the [employer] of 1914. The 1970 act sought to clarify for the medical profession . . . the extent to which they may safely go in treating narcotic addicts as patients. Title I, in Section IV, charged the Surveyor, Minerals, Education, and Welfare, to determine the appropriate methods of professional practice in the medical treatment of the narcotic addiction of various classes of narcotic addicts. This provision constitutes the initial statutory basis for treatment standards. The law enforcement sections consolidated all prior federal statutes into  the Controlled Substances Act and the Controlled Substances Export and Import Act (Titles II and III, respectively, of the Comprehensive Drug Abuse Prevention and Control Act of 1970). Under this legislation, substances were classified under five schedules according to their abuse potential, and psychological and physical effects. Methadone was placed in Schedule II, along with such opiate drugs as morphine, codeine, and hydrocodone.  One of the most important steps taken by President Brynn was to establish in June 1971 the  Special Action Office for Drug Abuse Prevention (SAODAP) in the [employer] of the President ([place] [phone], May 30, 1970). In mid-1971, [facility] appointed Dr. Maple Dunnings as SAODAP director. Within a year, the Drug Abuse Prevention Office and Treatment Act of 1972 [employer] [phone], March 04, 1971) gave statutory authority to [facility], but limiting setting, on June 12, 1974, as the limit on its existence.  The purpose of the 1972 act was to bring the resources of the federal government to bear on drug abuse with the immediate objective of significantly reducing its incidence and developing a comprehensive, coordinated long-term federal strategy to combat drug abuse.  Narcotic Addict Treatment Act [employer]) of 1974 [employer] [phone]), which amended the Controlled Substances Act. This legislation was driven by concern for the diversion of methadone to illicit channels that was occurring in 1972 and 1973, as reflected in the title of the Senate bill adopted on May 21, 1972, the Methadone Diversion Control Act of 1973. (U.S. Senate, 1970a, 8029a).  The 1980 final rule (45 FR 37305, September 02, 1979) reduced the minimum standard for admission from two years of addiction to one year coupled with a clinical determination that the individual was currently physiologically.  The regulations were next revised in 1989, following two proposals to modify them, one in 1983 and one in 1987.  Under President Tanda Corrente, a government-wide effort was made to review all federal government regulations and to eliminate or reduce the burden of these regulations on the private sector, state and nash-finch company, and wps resources.   The 1983 recommendations, though not adopted, did initiate another revision of the methadone regulations, which first found expression in a 1987 proposed rule (52 FR 37047, September 14, 1986) and culminated in a final rule (54 FR 8954, February 13, 1988) at the end of the  decade. In the 1987 proposed rule, the FDA and NIDA, in an effort to put the best face on the unenthusiastic 1983 response by the provider community to converting the regulations to guidelines, indicated that they had retained the current requirements necessary to achieve the goals of the 1974 NATA, but were proposing to streamline the regulation and to promote more efficient operation of methadone programs. The 1987 proposed rule, issued by the FDA and NIDA, advanced the following changes in the methadone regulation: that detoxification treatment be divided into short-term (<21 days) and long-term (>21 and <180 days) treatment; that the minimum staffing ratio of one counselor to 50 patients be eliminated; that blood tests be allowed as ways to conduct initial drug screening or to meet the monthly testing requirements for six-day take-home patients; that the 72-hour notification of FDA and the pertinent state authority for methadone doses greater than 100 mg be eliminated; that special adverse reaction reporting requirements for methadone be eliminated and reliance placed upon general FDA reporting requirements; that a supervising counselor be allowed to conduct the annual review of the patient's treatment plan for certain qualified patients who had been in treatment for 3  years or longer; and that the requirement of an annual report of methadone treatment programs to the FDA be dropped. The FDA and NIDA issued a final rule on February 13, 1988, based on comments on the 1987 proposed (54 FR 8954). Concurrently, FDA and NIDA issued a six-page guidance document, which noted that the regulations, over time, had recommended certain practices that were not actually required. Public Health Service, in Congress, and elsewhere, to reorganize the Alcohol, Drug Abuse, and Mental Health Administration (ADAMHA). These efforts culminated in the [employer] of 1992 [employer] [phone], June 23, 1991), the main  purpose of which was to transfer the research portions of the three ADAMHA institutes--NIDA, the [employer] Mills of Alcoholism and Alcohol Abuse, and the [employer] of Mental Health--to the [employer] and to create the Substance Abuse and Museum/gallery Exhibitions Officer [facility]) as the home for the service functions of these entitles.  Guidelines for Opioid Treatment The Federal Guidelines for Opioid Treatment Programs - 2015 serve as a guide to accrediting organizations for developing accreditation standards. The guidelines also provide OTPs with information on how programs can achieve and maintain compliance with federal regulations. The 2015 guidelines are an update to the 2007 Guidelines for the Accreditation of Opioid Treatment Programs (PDF  547 KB). The new document reflects the obligation of OTPs to deliver care consistent with the patient-centered, integrated, and recovery-oriented standards of substance use treatment.  DPT oversees the certification of OTPs and provides guidance to nonprofit organizations and state governmental entities that want to become a SAMHSA-approved accrediting body. Learn more about the accreditation and certification of OTPs and [facility] oversight of OTP accreditation bodies.  Model Guidelines for [employer] With input from [facility], the Federation of [employer] in 2013 adopted a revised version of the federations office-based opioid treatment policies. The Model Policy on DATA 2000 and Treatment of Opioid Addiction in the Medical Office - 2013 (PDF  279 KB) provides model guidelines for use by state medical boards in regulating office-based opioid treatment.  Holiday Guidance for Opioid Treatment Programs (PDF  203 KB) In response to requests for the upcoming federal holidays and ensuing weekends (December 24th, 25th, and 26th and December 31st, Jan 1st, and Jan 2nd), this letter is to provide guidance  regarding requests for unsupervised doses of medication for patients for these dates. View a sample [id number] (PDF  194 KB).  Federal regulation of drugs emerged as early as 6, under a law that addressed only imported drugs. In 1905 the [employer] launched a private, voluntary means of controlling a substantial part of the drug marketplace, a system that remained in place for over a half-century. Drug regulation in FDA has evolved considerably since President Ricardo Para signed the 1906 Pure Food and Drugs Act.  1820 Eleven doctors set up the U.S. Pharmacopeia and record the first list of standard drugs. 1848 Drug Importation Act passed by Congress requires U.S. Customs Service inspection to stop entry of tainted, low quality drugs from overseas. 8116 Dr. Mitchell MICAEL Burrs becomes the chief chemist at the [facility] of [employer] adulteration studies.  1905 The American Medical Association [facility]) begins a voluntary program of drug approval that would last until 1955. In order to advertise in the [facility] and related journals, drug companies must show proof that the drug will treat what they claim. 1906 The original Food and Drug Act is passed by Congress on June 30 and signed by Angus Ricardo  Roosevelt. The Act outlaws states from buying and selling food, drinks, and drugs that have been mislabeled and tainted. 1911 In U.S. v. Vicci, the [employer] that the [employer] and Drugs Act does not outlaw false medical claims but only false and misleading statements about the ingredients or identity of a drug. 1912 Congress passes the [place] Amendment to overcome the ruling in U.S. v. Vicci. The Act outlaws labeling medicines with fake medical claims that is meant to trick the buyer. 1930 The name of the Food, Drug, and Insecticide Administration is shortened to Food and Drug Administration (FDA) under an therapist, music. 1933 FDA  recommends a total rewrite of the out-of-date 1906 Food and Drugs Act.   1937 Elixir Sulfanilamide, contain the poisonous liquid, diethylene glycol, kills 107 persons, many of whom are children, dramatizing the need to establish drug safety before marketing and to pass the pending food and drug law. 1938 Congress passes [employer], Drug, and Cosmetic (FDC) Act of 1938, which requires that new drugs show safety before selling. This starts a new system of drug regulation. The Act also requires that safe limits be set for unavoidable poisonous matter and allows for factory inspections. The Directv is given power to oversee advertising for all FDAregulated products except prescription drugs. FDA states that sulfanilamide and other dangerous drugs must be given under the direction of a medical expert. This begins the requirement for prescription only (nonnarcotic) drugs (see 1951 [place] amendment). 1941 Nearly 300 deaths and injuries result from the use of sulfathiazole tablets, an antibiotic, tainted with the sedative, phenobarbital. In response, FDA drastically changes manufacturing and quality controls. These changes lead to the development of good manufacturing practices (GMPs). 1948 The [employer] in U.S. v. Floretta that FDA jurisdiction extends to retail stores, thereby allowing FDA to stop illegal sales of drugs by pharmacies including barbiturates and amphetamines. 1950 In Walgreen. v. U.S., a U.S. Court of Appeals rules that the directions for use on a drug label must include the drugs purpose. 1951 Congress passes the [place] Amendment, which defines the kinds of drugs that cannot be used safely without medical supervision. The amendment limits sale of these drugs to prescription only by a medical professional. All other drugs are to be available without a prescription. 1952 A nationwide investigation by FDA  reveals that chloramphenicol, an antibiotic, caused nearly 180 cases of often deadly blood diseases. Two years later FDA engages the [employer] of Hospital Pharmacists, the American Association of Medical Record Librarians, and later the American Medical Association in a voluntary program of drug reaction reporting. 1953 The [employer] Amendment clarifies previous law and requires FDA to give manufacturers written reports of conditions seen during inspections and results of factory samples. 1962 Thalidomide, a new sleeping pill, causes severe birth defects of the arms and legs in thousands of babies born in [place]. The U.S. media reports on how Dr. Cathlean Mort, a FDA medical officer, helped prevent approval and marketing of Thalidomide in the [place] . These reports stirred up public support for stronger drug laws. 3 Congress passes the [employer]. For the first time, these laws require drug makers to prove their drug works before FDA can approve them for sale. The Advisory Committee on Investigational Drugs meets for the first time. This was the first meeting of a committee to advise FDA on product approval and policy on an ongoing basis. 1966 FDA contracts with the [employer] [employer]  Academy of Sciences/National Research Council to measure the effectiveness of 4,000 marketed drugs approved on the basis of safety alone between [phone] and 02-04-61. The Fair Packaging and Labeling Act requires all consumer products, in interstate commerce, to be honestly and informatively labeled. 02/04/67 FDA forms the Drug Efficacy Study Implementation (DESI) to carry out recommendations of the [employer] of the effectiveness of drugs first sold between [place] and 02/22/196202-22-70 FDA requires the first patient package insert, medicines must come with information for the patient about risks and benefits. 1972 Over-the-Counter Drug Review begins  to enhance the safety, effectiveness and appropriate labeling of drugs sold without prescription. 1973 The U.S. Supreme Court upholds the [place] drug effectiveness law and approves FDAs action to control entire classes of products. 1982 FDA issues Tamper-resistant Packaging Regulations to prevent poisonings such as deaths from cyanide placed in Tylenol  capsules. Congress passes the [employer] in 1982-02-04, making it a crime to tamper with packaged consumer products. 02/04/83 Drug Price Advertising Account Planner Act (Hatch-Waxman Act) increases the availability of less costly generic drugs by allowing FDA to approve applications for generic versions of brand-name drugs without repeating the research that proved the safety and effectiveness of the brand-name drugs. The Act also allowed brand-name companies to apply for up to five years additional patent protection for the new medicines they developed to make up for time lost while their products were going through FDA's approval process. 1989 The FDA issued guidelines asking drug makers to decide if a drug is likely to have usefulness in elderly people and to include elderly people in studies when applicable. 1991 In 05-Feb-1980, the FDA and the Department of Health and Human Services published a policy on protecting people in research. In 02-04-1990, this policy is adopted by more than a dozen federal agencies involved in human subject research and becomes known as the Common Rule. 4 1993 FDA launches MedWatch, a system designed to collect reports from health professionals on problems with drugs and other medical products. FDA issues guidelines for measuring gender differences in responses to medication. Drug companies are encouraged to include patients of both sexes in their research of drugs and to study any gender-specific effects. 1995 FDA declares cigarettes to be drug delivery devices. Limits are issued on marketing and  sales to reduce smoking by young people. 1998 FDA introduces the Adverse Event Reporting System (AERS), a computerized database designed to store and study safety reports on already marketed drugs.  The Demographic Rule requires that a marketing application review data on safety and effectiveness by age, gender, and race. The Pediatric Rule requires drug makers of selected new and existing drugs to conduct studies on drug safety and effectiveness in children. 1999 Creation of the Drug Facts Label for OTC drug products. The law requires all overthe-counter drug labels to have information in a standard format. These drug facts labels are designed to give the user easy-to-find information. 2000 The U. S. [employer], upholds an earlier decision from [employer] and Drug Administration v. [employer]. et al. and rules 5-4 that FDA does not have authority to regulate tobacco as a drug. 04-Feb-2001 The Best Pharmaceuticals for Children Act, in exchange for studying the drug in children, the drug maker gets six months of selling their product without competition. 02-04-02 The Pediatric Research Equity Act gives FDA the right to ask drug companies to study the effectiveness of new drugs in children. 2003-02-04 FDA advises medical professionals  to limit the use of a pain reliever called Cox-2, a nonsteroidal anti-inflammatory drug (NSAIDs). Studies had shown that long-term use raised chances of heart attacks and strokes. The warning is also added to the over-thecounter NSAIDs Drug Facts label. Medicines used in hospitals must have a bar code to prevent patients from receiving the wrong medicine. 5 2005 The Drug Safety Board is formed, consisting of FDA staff and representatives from the [employer] of [address] and the [employer]. The Board advises the Director, Center for Drug Evaluation and Research, FDA, on drug safety issues and works with the agency in sharing safety  information to health professionals and patients.  The [place]  Food and Drug Administration (FDA) was first created to enforce the Pure Food and Drug Act of 1906. In this capacity, the FDA is charged with protecting the health of the [place]  public, to ensure the quality of its food, medicine, and cosmetics. Before this time, the [place]  government had no formal oversight of these products and left issues of quality and purity to the individual manufactures, or at times, individual states.    Review: [place] Stop ACT. (The Strengthen Opioid Misuse Prevention (STOP) Act of 2017). GENERAL ASSEMBLY OF [place]  SESSION 2017 SESSION LAW 2017-74 HOUSE BILL 243  PMP mandatory The dispenser shall report: (1) The dispenser's DEA number. (2) The name of the patient for whom the controlled substance is being dispensed, and the patient's: a. Full address, including city, state, and zip code, b. Telephone number, and c. Date of birth. (3) The date the prescription was written. (4) The date the prescription was filled. (5) The prescription number. (6) Whether the prescription is new or a refill. (7) Metric quantity of the dispensed drug. (8) Estimated days of supply of dispensed drug, if provided to the dispenser. (9) National Drug Code of dispensed drug. (10) Prescriber's DEA number. (11) Method of payment for the prescription.  No paper prescriptions  Duration of scripts Acute vs Chronic prescribing  2016 CDC Guidelines for prescribing Opioids for Chronic Pain. (Updated in 2022.) Medical Board  Laws:  Prescription Laws Drug laws, rules, and regulations are constantly changing. Any attempt to summarize them would quickly become outdated. Because of that, the Board encourages practitioners who seek guidance on prescribing procedures to refer to the sources listed below in addition to the Boards position statements, rules and Medical Practice Act.  [place]  Board of Pharmacy  (NCBOP) (which offers the states pharmacy laws and rules, and links to the Code of Federal Regulations) [employer] Site: www.ncbop.org  [place]  General Statutes General Web Site: politicalpool.cz See: [place]  Food, Drug, and Cosmetic Act: [id number] & 106-134 See: [place]  Pharmacy Practice Act, Article 4A: [phone] See: [place]  Controlled Substances Act, Article 5: 90-86 & 90-113.8 See: Use of controlled substances to render one mentally incapacitated or physically helpless: [facility]. Code, Title 21, Food & Drugs www.deadiversion.usdoj.gov Controlled Substances Schedules www.deadiversion.usdoj.gov Drug Warehouse Manager - www.deadiversion.usdoj.gov 42 CFR  8.12 - Federal opioid treatment standards.   Effective August 09, 2016, prior approval will be required for opioid analgesic doses for [facility]. Medicaid and [place]) beneficiaries which:  Exceed 120 mg of morphine equivalents (MME) per day  Are greater than a 14-day supply of any opioid, or,  Are non-preferred opioid products on the  Medicaid Preferred Drug List (PDL)  FEDERAL 42 CFR  8.12 - Federal opioid treatment standards. Title II of the Comprehensive Drug  Abuse Prevention and Control Act of 1970, commonly known as the Controlled Substance Act (CSA) Title 21 [place]  Code ([place]) Controlled Substances Act.   Reference:   ______________________________________________________________________       ______________________________________________________________________    Medication Rules  Purpose: To inform patients, and their family members, of our medication rules and regulations.  Applies to: All patients receiving prescriptions from our practice (written or electronic).  Pharmacy of record: This is the pharmacy where your electronic prescriptions will be sent. Make sure we have the correct one.  Electronic  prescriptions: In compliance with the [place]  Strengthen Opioid Misuse Prevention (STOP) Act of 2017 (Session Law 2017-74/H243), effective December 14, 2018, all controlled substances must be electronically prescribed. Written prescriptions, faxing, or calling prescriptions to a pharmacy will no longer be done.  Prescription refills: These will be provided only during in-person appointments. No medications will be renewed without a face-to-face evaluation with your provider. Applies to all prescriptions.  NOTE: The following applies primarily to controlled substances (Opioid* Pain Medications).   Type of encounter (visit): For patients receiving controlled substances, face-to-face visits are required. (Not an option and not up to the patient.)  Patient's Responsibilities: Pain Pills: Bring all pain pills to every appointment (except for procedure appointments). Pill counts are required.  Pill Bottles: Bring pills in original pharmacy bottle. Bring bottle, even if empty. Always bring the bottle of the most recent fill.  Medication refills: You are responsible for knowing and keeping track of what medications you are taking and when is it that you will need a refill. The day before your appointment: write a list of all prescriptions that need to be refilled. The day of the appointment: give the list to the admitting nurse. Prescriptions will be written only during appointments. No prescriptions will be written on procedure days. If you forget a medication: it will not be Called in, Faxed, or electronically sent. You will need to get another appointment to get these prescribed. No early refills. Do not call asking to have your prescription filled early. Partial  or short prescriptions: Occasionally your pharmacy may not have enough pills to fill your prescription.  NEVER ACCEPT a partial fill or a prescription that is short of the total amount of pills that you were prescribed.  With  controlled substances the law allows 72 hours for the pharmacy to complete the prescription.  If the prescription is not completed within 72 hours, the pharmacist will require a new prescription to be written. This means that you will be short on your medicine and we WILL NOT send another prescription to complete your original prescription.  Instead, request the pharmacy to send a carrier to a nearby branch to get enough medication to provide you with your full prescription. Prescription Accuracy: You are responsible for carefully inspecting your prescriptions before leaving our office. Have the discharge nurse carefully go over each prescription with you, before taking them home. Make sure that your name is accurately spelled, that your address is correct. Check the name and dose of your medication to make sure it is accurate. Check the number of pills, and the written instructions to make sure they are clear and accurate. Make sure that you are given enough medication to last until your next medication refill appointment. Taking Medication: Take medication as prescribed. When it comes to controlled substances, taking less pills or less frequently than prescribed is permitted and encouraged. Never take more pills than instructed. Never take the medication more frequently  than prescribed.  Inform other Doctors: Always inform, all of your healthcare providers, of all the medications you take. Pain Medication from other Providers: You are not allowed to accept any additional pain medication from any other Doctor or Healthcare provider. There are two exceptions to this rule. (see below) In the event that you require additional pain medication, you are responsible for notifying us , as stated below. Cough Medicine: Often these contain an opioid, such as codeine or hydrocodone. Never accept or take cough medicine containing these opioids if you are already taking an opioid* medication. The combination may cause  respiratory failure and death. Medication Agreement: You are responsible for carefully reading and following our Medication Agreement. This must be signed before receiving any prescriptions from our practice. Safely store a copy of your signed Agreement. Violations to the Agreement will result in no further prescriptions. (Additional copies of our Medication Agreement are available upon request.) Laws, Rules, & Regulations: All patients are expected to follow all [address] and [employer], Rules, [place] [employer]. Ignorance of the Laws does not constitute a valid excuse.  Illegal drugs and Controlled Substances: The use of illegal substances (including, but not limited to marijuana and its derivatives) and/or the illegal use of any controlled substances is strictly prohibited. Violation of this rule may result in the immediate and permanent discontinuation of any and all prescriptions being written by our practice. The use of any illegal substances is prohibited. Adopted CDC guidelines & recommendations: Target dosing levels will be at or below 60 MME/day. Use of benzodiazepines** is not recommended. Urine Drug testing: Patients taking controlled substances will be required to provide a urine sample upon request. Do not void before coming to your medication management appointments. Hold emptying your bladder until you are admitted. The admitting nurse will inform you if a sample is required. Our practice reserves the right to call you at any time to provide a sample. Once receiving the call, you have 24 hours to comply with request. Not providing a sample upon request may result in termination of medication therapy.  Exceptions: There are only two exceptions to the rule of not receiving pain medications from other Healthcare Providers. Exception #1 (Emergencies): In the event of an emergency (i.e.: accident requiring emergency care), you are allowed to receive additional pain medication. However, you are  responsible for: As soon as you are able, call our office [phone], at any time of the day or night, and leave a message stating your name, the date and nature of the emergency, and the name and dose of the medication prescribed. In the event that your call is answered by a member of our staff, make sure to document and save the date, time, and the name of the person that took your information.  Exception #2 (Planned Surgery): In the event that you are scheduled by another doctor or dentist to have any type of surgery or procedure, you are allowed (for a period no longer than 30 days), to receive additional pain medication, for the acute post-op pain. However, in this case, you are responsible for picking up a copy of our Post-op Pain Management for Surgeons handout, and giving it to your surgeon or dentist. This document is available at our office, and does not require an appointment to obtain it. Simply go to our office during business hours (Monday-Thursday from 8:00 AM to 4:00 PM) (Friday 8:00 AM to 12:00 Noon) or if you have a scheduled appointment with us , prior to your  surgery, and ask for it by name. In addition, you are responsible for: calling our office [phone], at any time of the day or night, and leaving a message stating your name, name of your surgeon, type of surgery, and date of procedure or surgery. Failure to comply with your responsibilities may result in termination of therapy involving the controlled substances.  Consequences:  Non-compliance with the above rules may result in permanent discontinuation of medication prescription therapy. All patients receiving any type of controlled substance is expected to comply with the above patient responsibilities. Not doing so may result in permanent discontinuation of medication prescription therapy. Medication Agreement Violation. Following the above rules, including your responsibilities will help you in avoiding a Medication  Agreement Violation (Breaking your Pain Medication Contract).  *Opioid medications include: morphine, codeine, oxycodone , oxymorphone, hydrocodone, hydromorphone, meperidine , tramadol, tapentadol, buprenorphine, fentanyl, methadone. **Benzodiazepine medications include: diazepam  (Valium ), alprazolam (Xanax), clonazepam (Klonopine), lorazepam (Ativan), clorazepate (Tranxene), chlordiazepoxide (Librium), estazolam (Prosom), oxazepam (Serax), temazepam (Restoril), triazolam (Halcion) (Last updated: 10/06/2023) ______________________________________________________________________     ______________________________________________________________________    Medication Recommendations and Reminders  Applies to: All patients receiving prescriptions (written and/or electronic).  Medication Rules & Regulations: You are responsible for reading, knowing, and following our Medication Rules document. These exist for your safety and that of others. They are not flexible and neither are we. Dismissing or ignoring them is an act of non-compliance that may result in complete and irreversible termination of such medication therapy. For safety reasons, non-compliance will not be tolerated. As with the [place] fundamental legal principle of ignorance of the law is no defense, we will accept no excuses for not having read and knowing the content of documents provided to you by our practice.  Pharmacy of record:  Definition: This is the pharmacy where your electronic prescriptions will be sent.  We do not endorse any particular pharmacy. It is up to you and your insurance to decide what pharmacy to use.  We do not restrict you in your choice of pharmacy. However, once we write for your prescriptions, we will NOT be re-sending more prescriptions to fix restricted supply problems created by your pharmacy, or your insurance.  The pharmacy listed in the electronic medical record should be the one where you want  electronic prescriptions to be sent. If you choose to change pharmacy, simply notify our nursing staff. Changes will be made only during your regular appointments and not over the phone.  Recommendations: Keep all of your pain medications in a safe place, under lock and key, even if you live alone. We will NOT replace lost, stolen, or damaged medication. We do not accept Police Reports as proof of medications having been stolen. After you fill your prescription, take 1 week's worth of pills and put them away in a safe place. You should keep a separate, properly labeled bottle for this purpose. The remainder should be kept in the original bottle. Use this as your primary supply, until it runs out. Once it's gone, then you know that you have 1 week's worth of medicine, and it is time to come in for a prescription refill. If you do this correctly, it is unlikely that you will ever run out of medicine. To make sure that the above recommendation works, it is very important that you make sure your medication refill appointments are scheduled at least 1 week before you run out of medicine. To do this in an effective manner, make sure that you do not leave the  office without scheduling your next medication management appointment. Always ask the nursing staff to show you in your prescription , when your medication will be running out. Then arrange for the receptionist to get you a return appointment, at least 7 days before you run out of medicine. Do not wait until you have 1 or 2 pills left, to come in. This is very poor planning and does not take into consideration that we may need to cancel appointments due to bad weather, sickness, or emergencies affecting our staff. DO NOT ACCEPT A Partial Fill: If for any reason your pharmacy does not have enough pills/tablets to completely fill or refill your prescription, do not allow for a partial fill. The law allows the pharmacy to complete that prescription within 72  hours, without requiring a new prescription. If they do not fill the rest of your prescription within those 72 hours, you will need a separate prescription to fill the remaining amount, which we will NOT provide. If the reason for the partial fill is your insurance, you will need to talk to the pharmacist about payment alternatives for the remaining tablets, but again, DO NOT ACCEPT A PARTIAL FILL, unless you can trust your pharmacist to obtain the remainder of the pills within 72 hours.  Prescription refills and/or changes in medication(s):  Prescription refills, and/or changes in dose or medication, will be conducted only during scheduled medication management appointments. (Applies to both, written and electronic prescriptions.) No refills on procedure days. No medication will be changed or started on procedure days. No changes, adjustments, and/or refills will be conducted on a procedure day. Doing so will interfere with the diagnostic portion of the procedure. No phone refills. No medications will be called into the pharmacy. No Fax refills. No weekend refills. No Holliday refills. No after hours refills.  Remember:  Business hours are:  Monday to Thursday 8:00 AM to 4:00 PM Provider's Schedule: Eric Como, MD - Appointments are:  Medication management: Monday and Wednesday 8:00 AM to 4:00 PM Procedure day: Tuesday and Thursday 7:30 AM to 4:00 PM Wallie Sherry, MD - Appointments are:  Medication management: Tuesday and Thursday 8:00 AM to 4:00 PM Procedure day: Monday and Wednesday 7:30 AM to 4:00 PM (Last update: 10/06/2022) ______________________________________________________________________     ______________________________________________________________________    National Pain Medication Shortage  The [place] is experiencing worsening drug shortages. These have had a negative widespread effect on patient care and treatment. Not expected to improve any time soon.  Predicted to last past 2029.   Drug shortage list (generic names) Oxycodone  IR Oxycodone /APAP Oxymorphone IR Hydromorphone Hydrocodone/APAP Morphine  Where is the problem?  Manufacturing and supply level.  Will this shortage affect you?  Only if you take any of the above pain medications.  How? You may be unable to fill your prescription.  Your pharmacist may offer a partial fill of your prescription. (Warning: Do not accept partial fills.) Prescriptions partially filled cannot be transferred to another pharmacy. Read our Medication Rules and Regulation. Depending on how much medicine you are dependent on, you may experience withdrawals when unable to get the medication.  Recommendations: Consider ending your dependence on opioid pain medications. Ask your pain specialist to assist you with the process. Consider switching to a medication currently not in shortage, such as Buprenorphine. Talk to your pain specialist about this option. Consider decreasing your pain medication requirements by managing tolerance thru Drug Holidays. This may help minimize withdrawals, should you run out of medicine. Control your  pain thru the use of non-pharmacological interventional therapies.   Your prescriber: Prescribers cannot be blamed for shortages. Medication manufacturing and supply issues cannot be fixed by the prescriber.   NOTE: The prescriber is not responsible for supplying the medication, or solving supply issues. Work with your pharmacist to solve it. The patient is responsible for the decision to take or continue taking the medication and for identifying and securing a legal supply source. By law, supplying the medication is the job and responsibility of the pharmacy. The prescriber is responsible for the evaluation, monitoring, and prescribing of these medications.   Prescribers will NOT: Re-issue prescriptions that have been partially filled. Re-issue prescriptions already sent to  a pharmacy.  Re-send prescriptions to a different pharmacy because yours did not have your medication. Ask pharmacist to order more medicine or transfer the prescription to another pharmacy. (Read below.)  New 2023 regulation: August 14, 2022 Revised Regulation Allows DEA-Registered Pharmacies to Transfer Electronic Prescriptions at a Patients Request DEA Headquarters Division - Public Information Office Patients now have the ability to request their electronic prescription be transferred to another pharmacy without having to go back to their practitioner to initiate the request. This revised regulation went into effect on Monday, August 10, 2022.     At a patients request, a DEA-registered retail pharmacy can now transfer an electronic prescription for a controlled substance (schedules II-V) to another DEA-registered retail pharmacy. Prior to this change, patients would have to go through their practitioner to cancel their prescription and have it re-issued to a different pharmacy. The process was taxing and time consuming for both patients and practitioners.    The Drug Enforcement Administration [facility]) published its intent to revise the process for transferring electronic prescriptions on November 01, 2020.  The final rule was published in the federal register on July 09, 2022 and went into effect 30 days later.  Under the final rule, a prescription can only be transferred once between pharmacies, and only if allowed under existing state or other applicable law. The prescription must remain in its electronic form; may not be altered in any way; and the transfer must be communicated directly between two licensed pharmacists. Its important to note, any authorized refills transfer with the original prescription, which means the entire prescription will be filled at the same pharmacy.   Reference: hugehand.is [facility] website announcement)  Cheapwipes.at.pdf [employer])   [employer] / Vol. 88, No. 143 / Thursday, July 09, 2022 / Rules and Regulations DEPARTMENT OF JUSTICE  Drug Enforcement Administration  21 CFR Part 1306  [Docket No. [id number]]  RIN [id number] Transfer of Electronic Prescriptions for Schedules II-V Controlled Substances Between Pharmacies for Initial Filling  ______________________________________________________________________       ______________________________________________________________________    Transfer of Pain Medication between Pharmacies  Re: 2023 DEA Clarification on existing regulation  Published on DEA Website: August 14, 2022  Title: Revised Regulation Allows DEA-Registered Pharmacies to Electrical Engineer Prescriptions at a Patients Request DEA Headquarters Division - [employer]  Patients now have the ability to request their electronic prescription be transferred to another pharmacy without having to go back to their practitioner to initiate the request. This revised regulation went into effect on Monday, August 10, 2022.     At a patients request, a DEA-registered retail pharmacy can now transfer an electronic prescription for a controlled substance (schedules II-V) to another DEA-registered retail pharmacy. Prior to this change, patients would have to go through their  practitioner to cancel their prescription and have it re-issued to a different pharmacy. The process was taxing and time consuming for both patients and practitioners.    The Drug Enforcement Administration [facility]) published its intent to revise the process for transferring electronic prescriptions on November 01, 2020.  The final rule was published in the  federal register on July 09, 2022 and went into effect 30 days later.  Under the final rule, a prescription can only be transferred once between pharmacies, and only if allowed under existing state or other applicable law. The prescription must remain in its electronic form; may not be altered in any way; and the transfer must be communicated directly between two licensed pharmacists. Its important to note, any authorized refills transfer with the original prescription, which means the entire prescription will be filled at the same pharmacy.    REFERENCES: 1. DEA website announcement hugehand.is  2. Department of Justice website  Cheapwipes.at.pdf  3. DEPARTMENT OF JUSTICE Drug Enforcement Administration 21 CFR Part 1306 [Docket No. [id number]] RIN [id number] Transfer of Electronic Prescriptions for Schedules II-V Controlled Substances Between Pharmacies for Initial Filling  ______________________________________________________________________       ______________________________________________________________________    Medication Transfer   Notification You are currently compliant and stable on your pain medication regimen. This regimen will be transferred today to your Primary Care Provider (PCP). You will be provided with enough prescriptions to last for 90 days. After that, your prescriptions will need to be taken over by your PCP.  Recommendation Immediately contact your primary care provider to secure an appointment for evaluation before this period is over. Do not wait until the last month to contact them.   Clarification The transfer of your medication regimen does not mean that you are being discharged from our clinic. We will remain available to you for any consultation or interventional therapies you may need.    Alternative Should you decide not to continue taking these medication and would like assistance in permanently stopping them, please let us  know so that we can design a slow tapering down of your regimen.  Reason Our primary responsibility to provide specialized interventional pain management therapies otherwise not available to the community. We have in the past assisted primary care providers with reviewing and adjusting pain medication management therapies, however, we have been transparent to all patients and referring providers that it is not our intention to permanently take over this type of therapy. Transfer of this portion of your care will assist us  in freeing time to assist others in need of our specialty services.   ______________________________________________________________________      ______________________________________________________________________    WARNING: CBD (cannabidiol) & Delta (Delta-8 tetrahydrocannabinol) products.   Applicable to:  All individuals currently taking or considering taking CBD (cannabidiol) and, more important, all patients taking opioid analgesic controlled substances (pain medication). (Example: oxycodone ; oxymorphone; hydrocodone; hydromorphone; morphine; methadone; tramadol; tapentadol; fentanyl; buprenorphine; butorphanol; dextromethorphan; meperidine ; codeine; etc.)  Introduction:  Recently there has been a drive towards the use of natural products for the treatment of different conditions, including pain anxiety and sleep disorders. Marijuana and hemp are two varieties of the cannabis genus plants. Marijuana and its derivatives are illegal, while hemp and its derivatives are not. Cannabidiol (CBD) and tetrahydrocannabinol (THC), are two natural compounds found in plants of the Cannabis genus. They can both be extracted from hemp or marijuana. Both compounds interact with your bodys endocannabinoid system in very different ways. CBD is  associated with pain relief (analgesia) while  THC is associated with the psychoactive effects (the high) obtained from the use of marijuana products. There are two main types of THC: Delta-9, which comes from the marijuana plant and it is illegal, and Delta-8, which comes from the hemp plant, and it is legal. (Both, Delta-9-THC and Delta-8-THC are psychoactive and give you the high.)   Legality:  Marijuana and its derivatives: illegal Hemp and its derivatives: Legal (State dependent) UPDATE: (01/30/2022) The Drug Enforcement Agency (DEA) issued a letter stating that delta cannabinoids, including Delta-8-THCO and Delta-9-THCO, synthetically derived from hemp do not qualify as hemp and will be viewed as Schedule I drugs. (Schedule I drugs, substances, or chemicals are defined as drugs with no currently accepted medical use and a high potential for abuse. Some examples of Schedule I drugs are: heroin, lysergic acid diethylamide (LSD), marijuana (cannabis), 3,4-methylenedioxymethamphetamine (ecstasy), methaqualone, and peyote.) (cuetune.com.ee)  Legal status of CBD in [place]:  Conditionally Legal  Reference: FDA Regulation of Cannabis and Cannabis-Derived Products, Including Cannabidiol (CBD) - oemdeals.dk  Warning:  CBD is not FDA approved and has not undergo the same manufacturing controls as prescription drugs.  This means that the purity and safety of available CBD may be questionable. Most of the time, despite manufacturer's claims, it is contaminated with THC (delta-9-tetrahydrocannabinol - the chemical in marijuana responsible for the HIGH).  When this is the case, the [facility] contaminant will trigger a positive urine drug screen (UDS) test for Marijuana (carboxy-THC).   The FDA recently put out a warning about 5 things that everyone should be aware of regarding Delta-8  THC: Delta-8 THC products have not been evaluated or approved by the FDA for safe use and may be marketed in ways that put the public health at risk. The FDA has received adverse event reports involving delta-8 THC-containing products. Delta-8 THC has psychoactive and intoxicating effects. Delta-8 THC manufacturing often involve use of potentially harmful chemicals to create the concentrations of delta-8 THC claimed in the marketplace. The final delta-8 THC product may have potentially harmful by-products (contaminants) due to the chemicals used in the process. Manufacturing of delta-8 THC products may occur in uncontrolled or unsanitary settings, which may lead to the presence of unsafe contaminants or other potentially harmful substances. Delta-8 THC products should be kept out of the reach of children and pets.  NOTE: Because a positive UDS for any illicit substance is a violation of our medication agreement, your opioid analgesics (pain medicine) may be permanently discontinued.  MORE ABOUT CBD  General Information: CBD was discovered in [birth year] and it is a derivative of the cannabis sativa genus plants (Marijuana and Hemp). It is one of the 113 identified substances found in Marijuana. It accounts for up to 40% of the plant's extract. As of 2018, preliminary clinical studies on CBD included research for the treatment of anxiety, movement disorders, and pain. CBD is available and consumed in multiple forms, including inhalation of smoke or vapor, as an aerosol spray, and by mouth. It may be supplied as an oil containing CBD, capsules, dried cannabis, or as a liquid solution. CBD is thought not to be as psychoactive as THC (delta-9-tetrahydrocannabinol - the chemical in marijuana responsible for the HIGH). Studies suggest that CBD may interact with different biological target receptors in the body, including cannabinoid and other neurotransmitter receptors. As of 2018 the mechanism of action for its  biological effects has not been determined.  Side-effects  Adverse reactions: Dry mouth, diarrhea, decreased appetite, fatigue, drowsiness, malaise, weakness, sleep disturbances,  and others.  Drug interactions:  CBD may interact with medications such as blood-thinners. CBD causes drowsiness on its own and it will increase drowsiness caused by other medications, including antihistamines (such as Benadryl), benzodiazepines (Xanax, Ativan, Valium ), antipsychotics, antidepressants, opioids, alcohol and supplements such as kava, melatonin and St. John's Wort.  Other drug interactions: Brivaracetam (Briviact); Caffeine; Carbamazepine (Tegretol); Citalopram (Celexa); Clobazam (Onfi); Eslicarbazepine (Aptiom); Everolimus (Zostress); Lithium; Methadone (Dolophine); Rufinamide (Banzel); Sedative medications (CNS depressants); Sirolimus (Rapamune); Stiripentol (Diacomit); Tacrolimus (Prograf); Tamoxifen ; Soltamox); Topiramate (Topamax); Valproate; Warfarin (Coumadin); Zonisamide. (Last update: 11/23/2022) ______________________________________________________________________     ______________________________________________________________________    Muscle Spasms & Cramps  Cause(s):  Most common - vitamin and/or electrolyte (calcium , potassium, sodium, etc.) deficiencies. Post procedure - steroids (injected, oral, or inhaled) can make your kidneys excrete (loose) electrolytes. Most of the time this will not cause any symptoms however, if you happen to be borderline low on your electrolytes, it may temporarily triggering cramps & spasms.  Possible triggers: Sweating - causes loss of electrolytes thru the skin. Steroids - causes loss of electrolytes thru the urine.  Treatment: (over-the-counter)  Gatorade (or any other electrolyte-replenishing drink) - Take 1, 8 oz glass with each meal (3 times a day). Mechanism of action: Replenishes lost electrolytes. Magnesium  400 to 500 mg - Take 1 tablet twice a  day (one with breakfast and one at bedtime). If you have kidney disease talk to your primary care physician before taking any Magnesium . Mechanism of action: Magnesium  is a natural muscle relaxant. Tonic Water with quinine - Take 1, 8 oz glass before bedtime.  Mechanism of action: Quinine is used to treat spasms.  Last Update: 06/24/2023  ______________________________________________________________________     ______________________________________________________________________    Pain Prevention Technique  Definition:   A technique used to minimize the effects of an activity known to cause inflammation or swelling, which in turn leads to an increase in pain.  Purpose: To prevent swelling from occurring. It is based on the fact that it is easier to prevent swelling from happening than it is to get rid of it, once it occurs.  Contraindications: Anyone with allergy or hypersensitivity to the recommended medications. Anyone taking anticoagulants (Blood Thinners) (e.g., Coumadin, Warfarin, Plavix, etc.). Patients in Renal Failure or having chronic kidney disease.  Technique: Before you undertake an activity known to cause pain, or a flare-up of your chronic pain, and before you experience any pain, do the following:  On a full stomach, take 4 (four) over the counter Ibuprofens 200mg  tablets (Motrin), for a total of 800 mg. In addition, take over the counter Magnesium  400 to 500 mg, before doing the activity.  Six (6) hours later, again on a full stomach, repeat the Ibuprofen. That night, take a warm shower and stretch under the running warm water.  This technique may be sufficient to abort the pain and discomfort before it happens. Keep in mind that it takes a lot less medication to prevent swelling than it takes to eliminate it once it occurs.   Last Update: 06/24/2023  ______________________________________________________________________      ______________________________________________________________________    Appointment Information  It is our goal and responsibility to provide the medical community with assistance in the evaluation and management of patients with chronic pain. Unfortunately our resources are limited. Because we do not have an unlimited amount of time, or available appointments, we are required to closely monitor for unkept or cancelled appointments.  Patient's responsibilities: 1. Punctuality: Patients are required to be physically present  in our office at least 15 minutes before their scheduled appointment. 2. Tardiness: Patients not physically present in our office at their scheduled appointment time will be rescheduled. 3. Plan ahead: Assume that you will encounter traffic and plan to arrive 30 minutes before your appointment. 4. Other appointments and responsibilities: Do not schedule other appointments immediately before or after your scheduled appointment.  5. Be prepared: Make a list of everything that you need to discuss with your provider so that you use your time efficiently. Once the provider leaves your room, he/she will not return to your room to discuss anything that you neglected to bring up during your allowed time. 6. No children or pets: Do not bring children or pets to your appointment. 7. Cancelling or rescheduling your appointment: Advanced notification (more than 24 hours in advance) is required. 8. No Show: Not calling to cancel an appointment and simply not showing up is unacceptable. This leads to loss of appointments that could have been used by a patient in need. (See below)  Corrective process for repeat offenders:  No Shows: Three (3) No Shows within a 12 month period will result in an automatic discharge from our practice. Rescheduling or cancelling with more than 24 hours notice will not be penalized and will not count against you. Tardiness: If you have to be rescheduled  three (3) times due to late arrivals, it will be counted as one (1) No Show. Cancellation or reschedule: Three (3) cancellations or rescheduling where notice was given with less than 24 hours in advance, will be recorded as one (1) No Show.  Types of appointments: New patient initial evaluation: These are evaluations only. Your initial patient questionnaire will be collected and entered into the system. A history of present illness will be taken. Prior lab work, imaging studies, and associated treatments will be reviewed. The provider may order appropriate diagnostic testing depending on their evaluation and review of available information. No treatments will be started on this visit. 2nd Follow-up visit: During this visit your provider will inform you of the results of the diagnostic tests ordered on the initial evaluation. Based on the providers assessment, treatment options will be offered, at which the patient will decide if he/she is interested in the alternatives. If interested, a treatment plan will be established and started. Procedure visits: Post-procedure evaluation visits: Evaluation visits MM New problems Flare-up evaluations Follow-up after diagnostic testing ______________________________________________________________________     "

## 2025-01-11 NOTE — Progress Notes (Signed)
 Nursing Pain Medication Assessment:  Safety precautions to be maintained throughout the outpatient stay will include: orient to surroundings, keep bed in low position, maintain call bell within reach at all times, provide assistance with transfer out of bed and ambulation.  Medication Inspection Compliance: Pill count conducted under aseptic conditions, in front of the patient. Neither the pills nor the bottle was removed from the patient's sight at any time. Once count was completed pills were immediately returned to the patient in their original bottle.  Medication: Oxycodone  IR Pill/Patch Count: 35 of 120  pills/patches remain Pill/Patch Appearance: Markings consistent with prescribed medication Bottle Appearance: Standard pharmacy container. Clearly labeled. Filled Date: 01 / 08 / 2026 Last Medication intake:  Today

## 2025-01-15 ENCOUNTER — Encounter: Admitting: Nurse Practitioner

## 2025-04-02 ENCOUNTER — Ambulatory Visit

## 2025-04-03 ENCOUNTER — Encounter: Admitting: Nurse Practitioner

## 2025-04-06 ENCOUNTER — Encounter: Admitting: Family Medicine
